# Patient Record
Sex: Female | Born: 1950 | Race: White | Hispanic: No | Smoking: Never smoker
Health system: Southern US, Community
[De-identification: ages and names within clinical notes are randomized; demographics above are authoritative.]

## PROBLEM LIST (undated history)

## (undated) DIAGNOSIS — I509 Heart failure, unspecified: Secondary | ICD-10-CM

## (undated) DIAGNOSIS — F329 Major depressive disorder, single episode, unspecified: Secondary | ICD-10-CM

## (undated) DIAGNOSIS — M1990 Unspecified osteoarthritis, unspecified site: Secondary | ICD-10-CM

## (undated) DIAGNOSIS — T7840XA Allergy, unspecified, initial encounter: Secondary | ICD-10-CM

## (undated) DIAGNOSIS — M51369 Other intervertebral disc degeneration, lumbar region without mention of lumbar back pain or lower extremity pain: Secondary | ICD-10-CM

## (undated) DIAGNOSIS — H269 Unspecified cataract: Secondary | ICD-10-CM

## (undated) DIAGNOSIS — M419 Scoliosis, unspecified: Secondary | ICD-10-CM

## (undated) DIAGNOSIS — M4800 Spinal stenosis, site unspecified: Secondary | ICD-10-CM

## (undated) DIAGNOSIS — F32A Depression, unspecified: Secondary | ICD-10-CM

## (undated) DIAGNOSIS — M5136 Other intervertebral disc degeneration, lumbar region: Secondary | ICD-10-CM

## (undated) DIAGNOSIS — J40 Bronchitis, not specified as acute or chronic: Secondary | ICD-10-CM

## (undated) HISTORY — PX: OTHER SURGICAL HISTORY: SHX169

## (undated) HISTORY — DX: Allergy, unspecified, initial encounter: T78.40XA

## (undated) HISTORY — DX: Other intervertebral disc degeneration, lumbar region without mention of lumbar back pain or lower extremity pain: M51.369

## (undated) HISTORY — DX: Heart failure, unspecified: I50.9

## (undated) HISTORY — DX: Unspecified cataract: H26.9

## (undated) HISTORY — DX: Other intervertebral disc degeneration, lumbar region: M51.36

## (undated) HISTORY — PX: LUMBAR SPINE SURGERY: SHX701

## (undated) HISTORY — PX: HEEL SPUR EXCISION: SHX1733

## (undated) HISTORY — PX: EYE SURGERY: SHX253

## (undated) HISTORY — PX: TONSILLECTOMY: SUR1361

---

## 2004-09-18 ENCOUNTER — Ambulatory Visit: Payer: Self-pay | Admitting: Internal Medicine

## 2005-12-31 ENCOUNTER — Ambulatory Visit: Payer: Self-pay | Admitting: Internal Medicine

## 2006-12-27 ENCOUNTER — Ambulatory Visit: Payer: Self-pay | Admitting: Nurse Practitioner

## 2006-12-27 IMAGING — US ULTRASOUND RIGHT BREAST
1 series · 7 of 7 positions shown · non-contrast
Comparison: none

REASON FOR EXAM: Painful and swollen red area at 3 o'clock in the right
breast, time for yearly exam
COMMENTS:

[Series 1: ultrasound right breast · 7 of 7 slices shown]
[im 1/7]
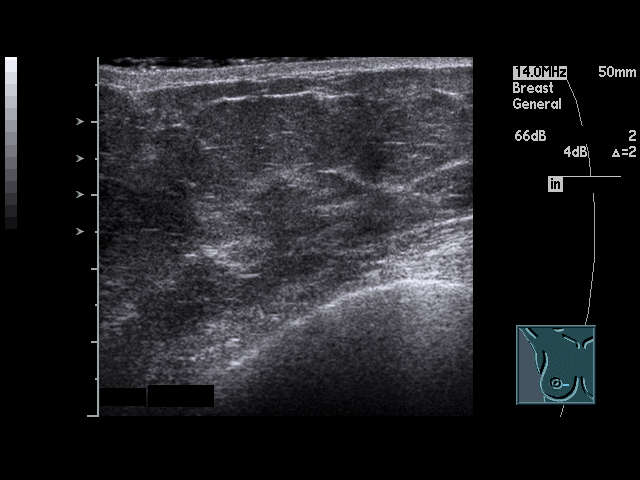
[im 2/7]
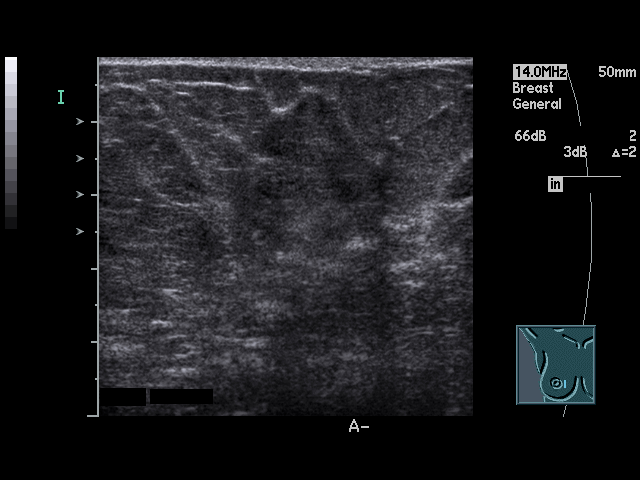
[im 3/7]
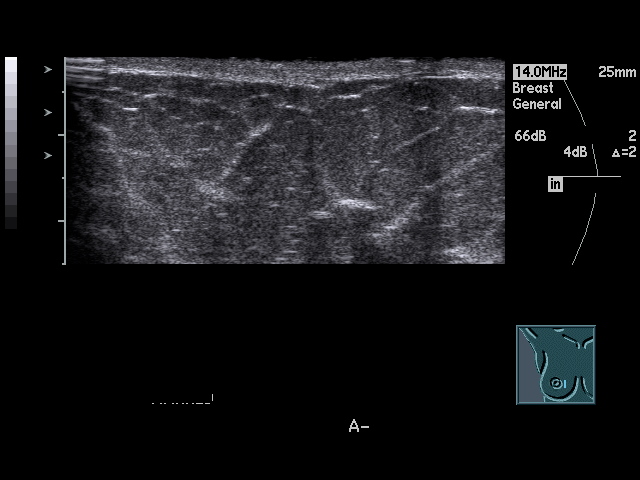
[im 4/7]
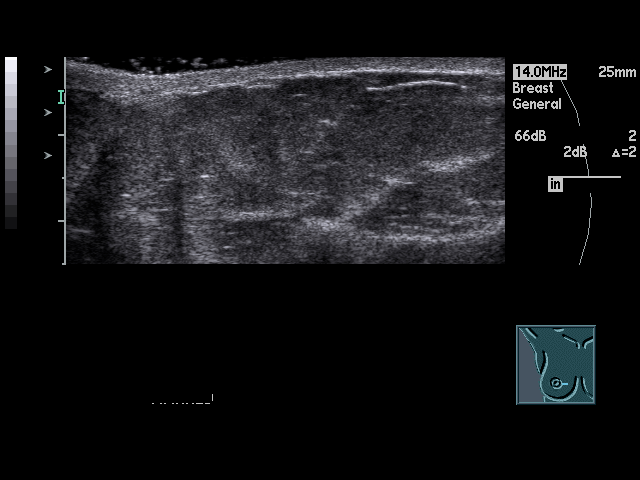
[im 5/7]
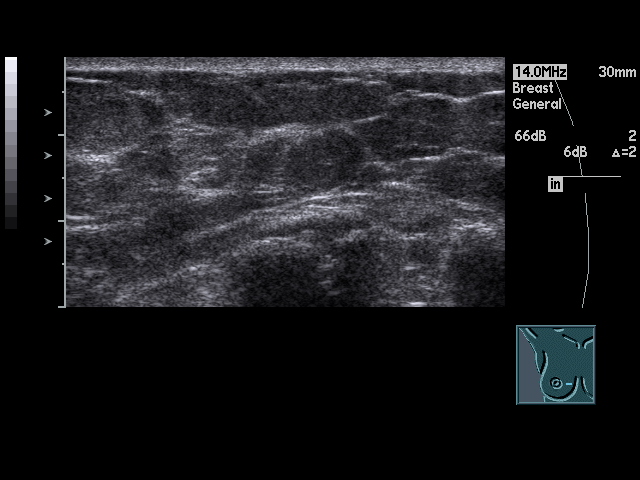
[im 6/7]
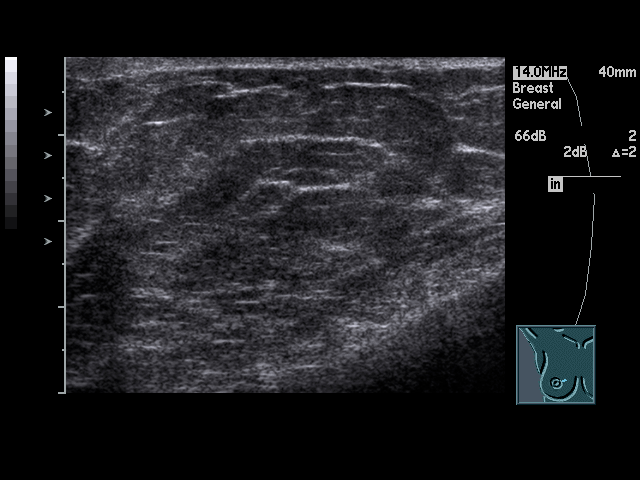
[im 7/7]
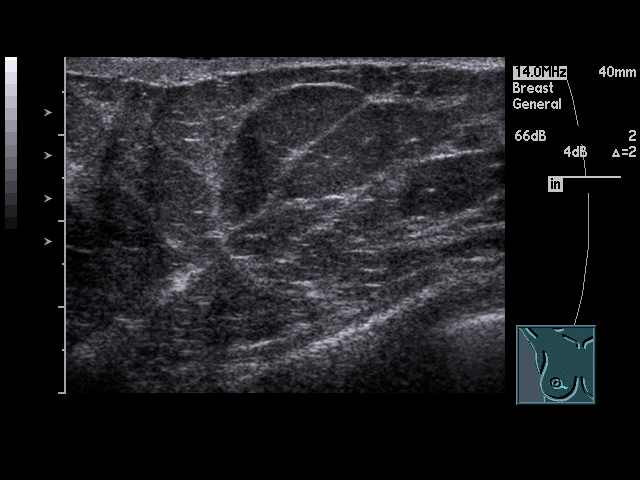

[7 of 7 positions shown; findings below may reference images not displayed]

PROCEDURE:     US  - US BREAST RIGHT  - December 27, 2006 [DATE]

RESULT:     Targeted sonographic evaluation of the 3 o'clock region of the
RIGHT breast is performed. The patient reports the area is less worrisome
than previously and appears to be resolving. Ultrasound demonstrates no
focal sonographic abnormality.
IMPRESSION: BI-RADS: Category 2 - Benign Findings. The lack of
significant sonographic or mammographic finding should not prohibit further
investigation of a clinically worrisome area.

## 2008-01-30 ENCOUNTER — Ambulatory Visit: Payer: Self-pay | Admitting: Internal Medicine

## 2008-12-17 ENCOUNTER — Ambulatory Visit: Payer: Self-pay | Admitting: Gastroenterology

## 2009-10-08 ENCOUNTER — Ambulatory Visit: Payer: Self-pay | Admitting: Internal Medicine

## 2011-07-23 ENCOUNTER — Ambulatory Visit: Payer: Self-pay | Admitting: Internal Medicine

## 2011-07-23 IMAGING — MG MM DIGITAL SCREENING BILAT W/ CAD
1 series · 4 of 4 positions shown · non-contrast
Comparison: none

REASON FOR EXAM: SCR MAMMO NO ORDER
COMMENTS:

PROCEDURE:     MMM - MMM DGT SCR NO ORDER W/CAD  - July 23, 2011  [DATE]
RESULT:      No dominant masses or pathologic clustered calcifications are
demonstrated. CAD evaluation is nonfocal.

[Series 3977: R CC · right · 4 of 4 slices shown]
[im 1/4]
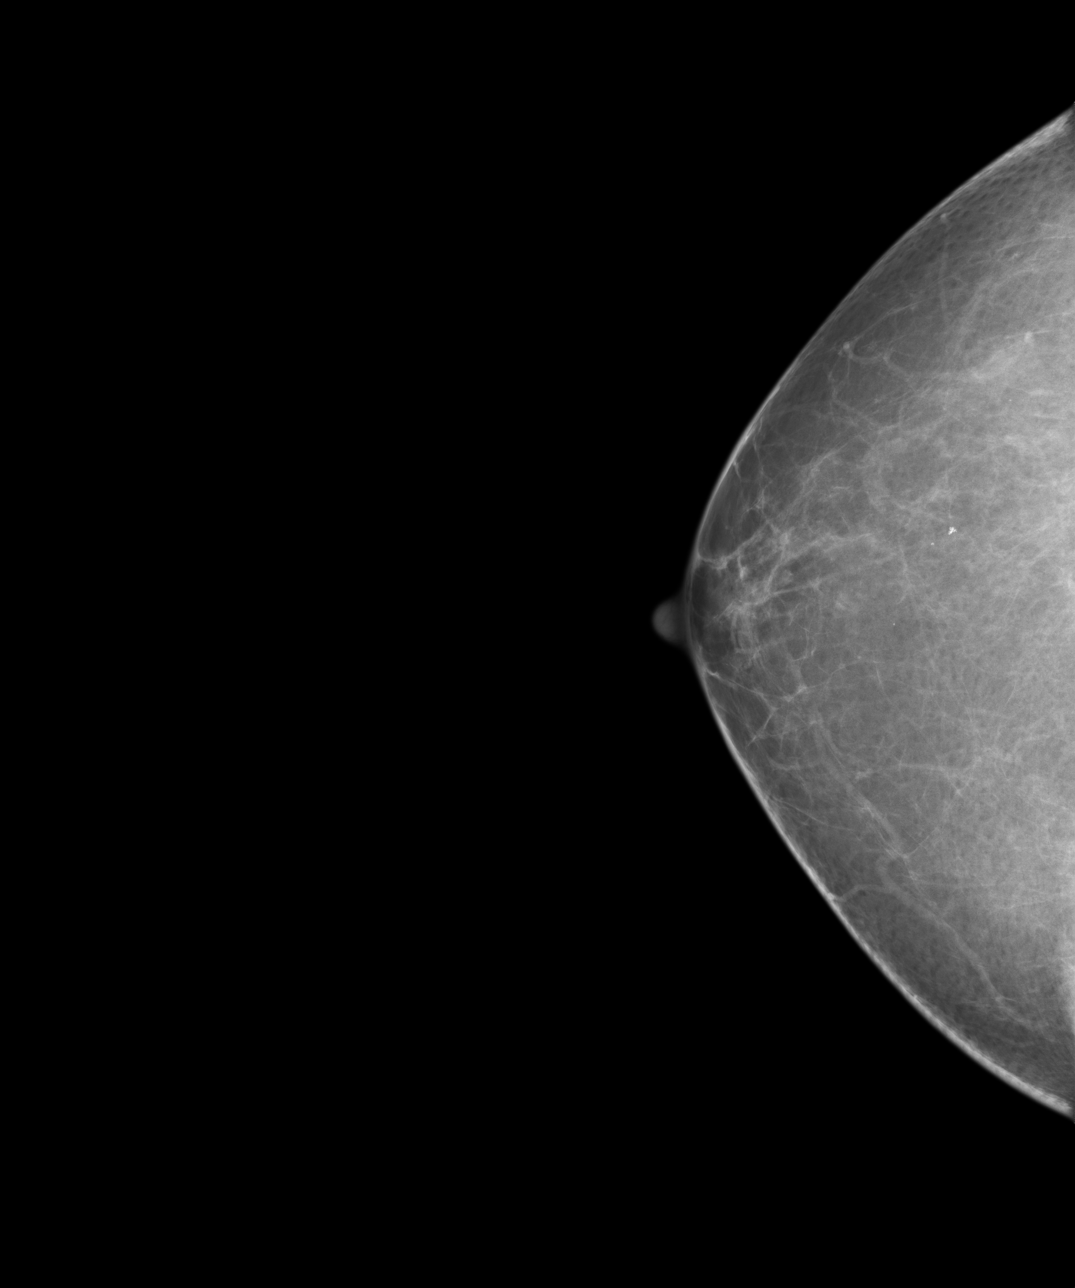
[im 2/4]
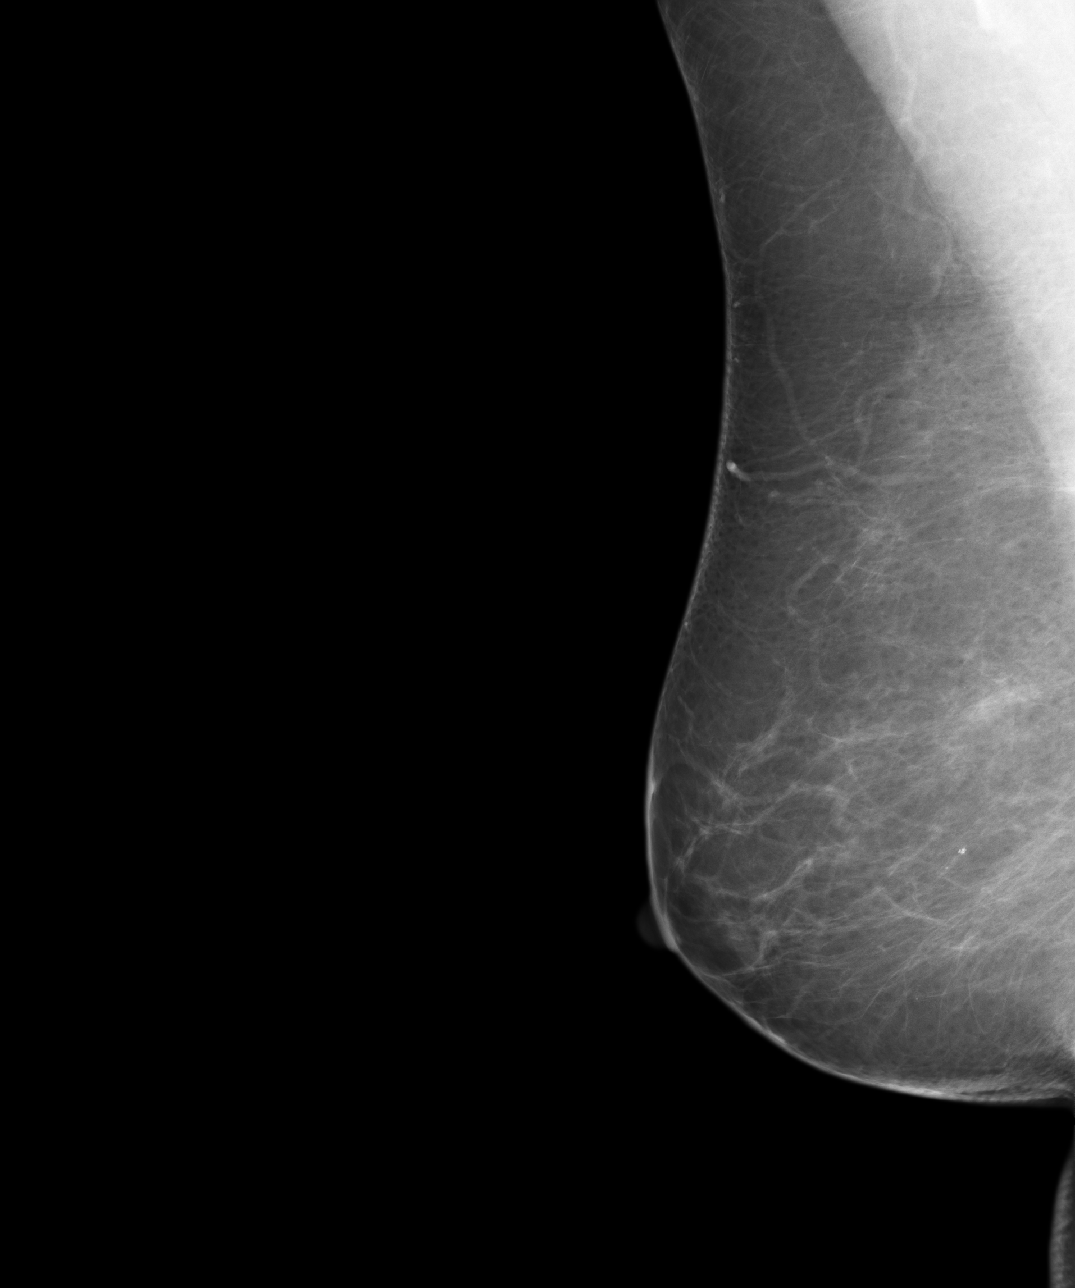
[im 3/4]
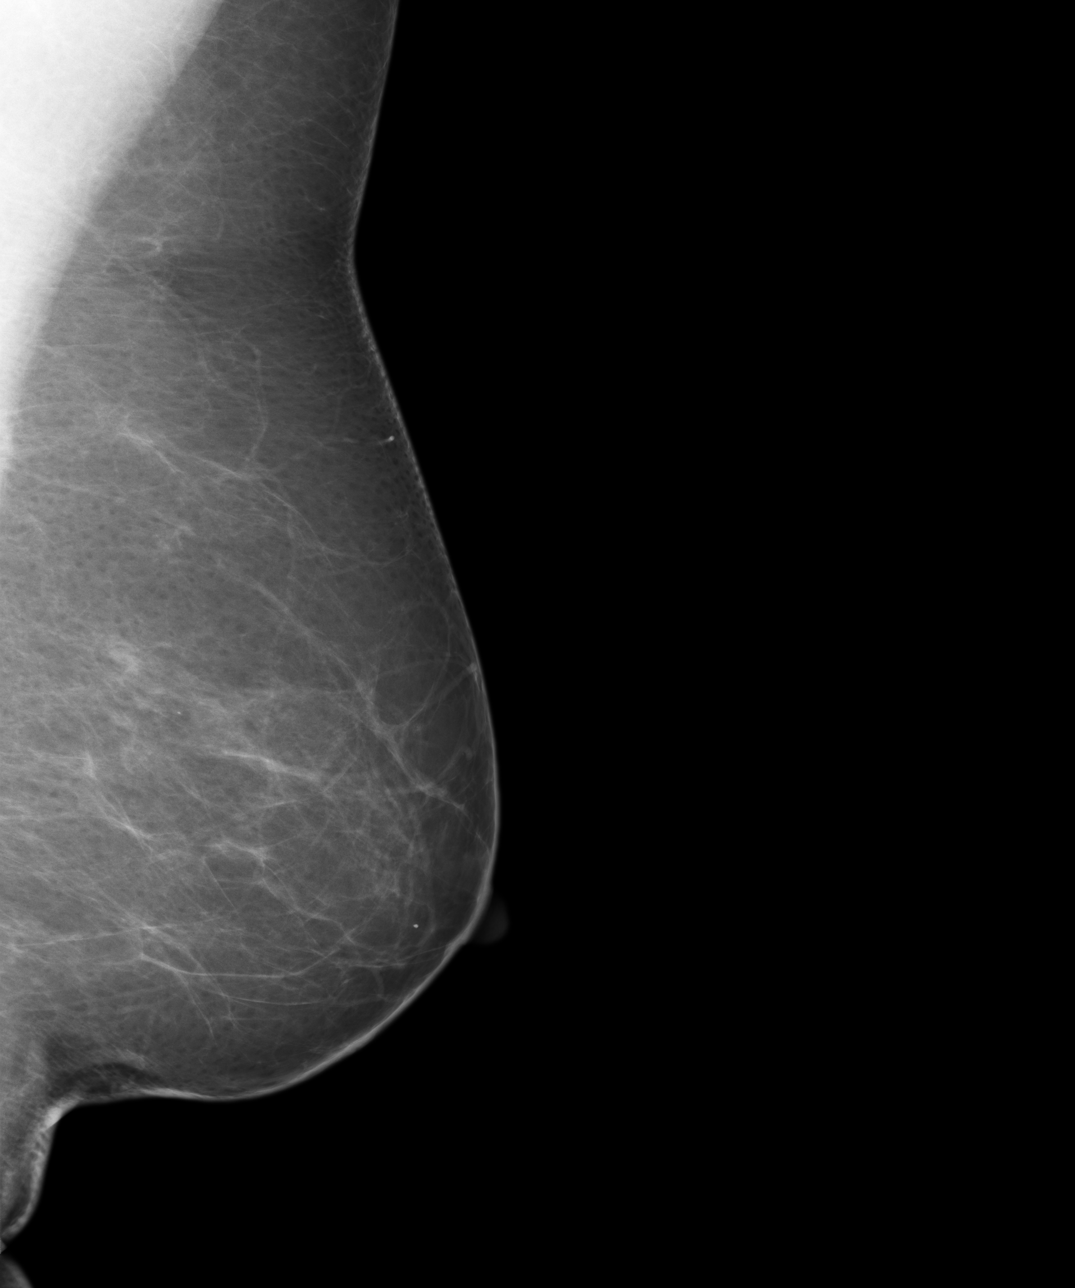
[im 4/4]
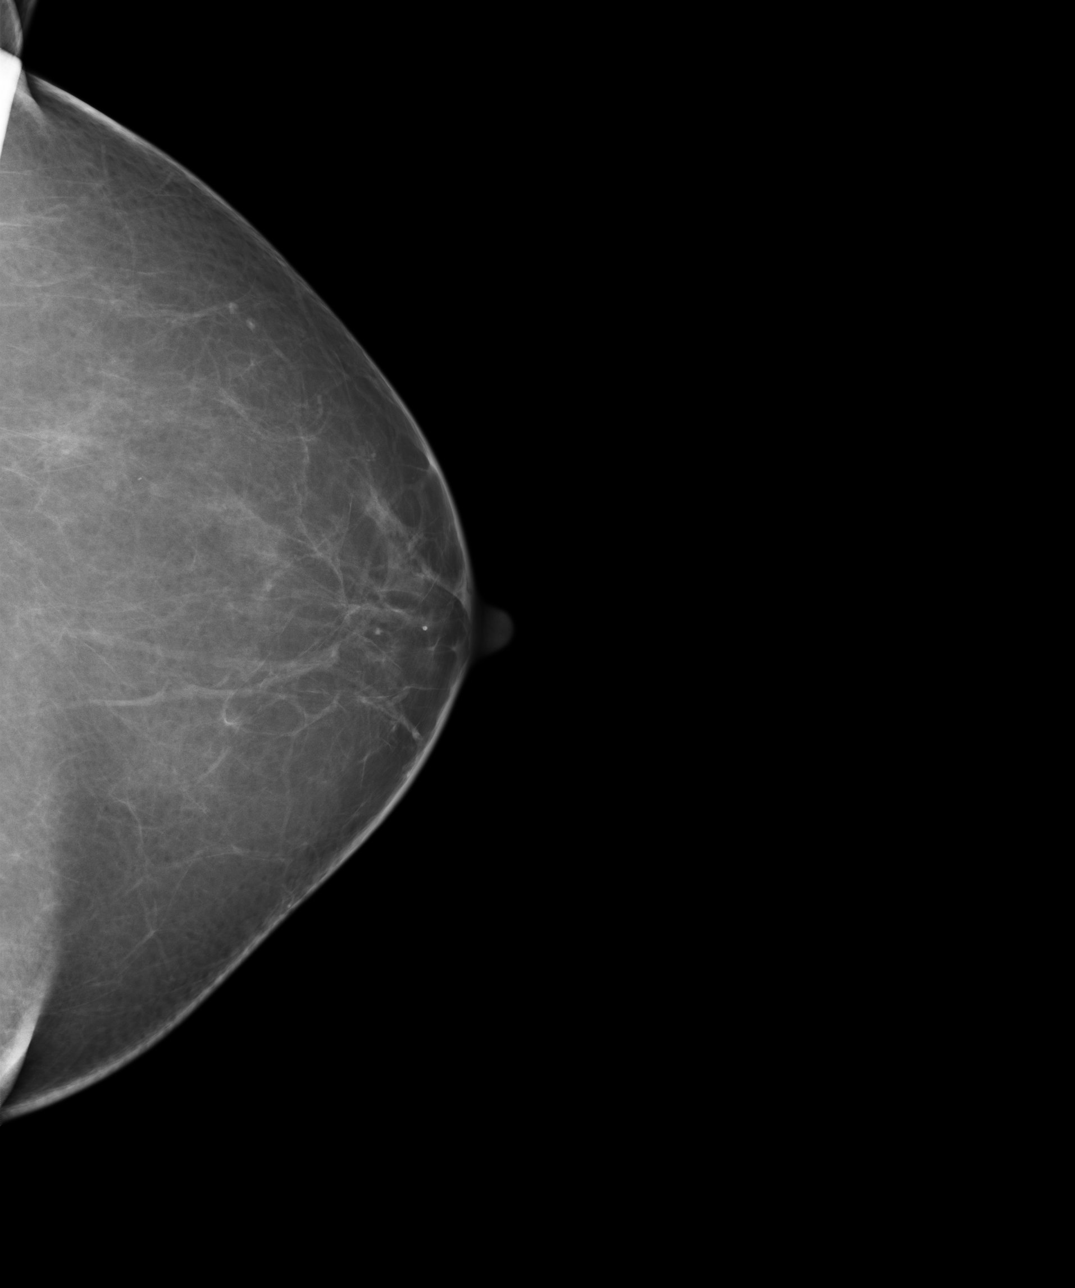

[4 of 4 positions shown; findings below may reference images not displayed]

IMPRESSION: 1.     Benign exam.
2.     Routine yearly follow-up mammogram suggested.
3.     BI-RADS 2:  Benign Findings.

A NEGATIVE MAMMOGRAM REPORT DOES NOT PRECLUDE BIOPSY OR OTHER EVALUATION OF
A CLINICALLY PALPABLE OR OTHERWISE SUSPICOUS MASS OR LESION.  BREAST CANCER
MAY NOT BE DETECTED BY MAMMOGRAPHY IN UP TO 10% OF CASES.

## 2011-10-08 ENCOUNTER — Ambulatory Visit: Payer: Self-pay | Admitting: Internal Medicine

## 2011-10-08 IMAGING — US US ABDOMEN LIMITED SLG ORGAN/ASCITES
1 series · 17 of 25 positions shown · non-contrast
Comparison: none

REASON FOR EXAM: RUQ pain eval for  gallstones
COMMENTS:

[Series 1: us abdomen limited slg organ/ascites · 17 of 37 slices shown]
[im 1/37]
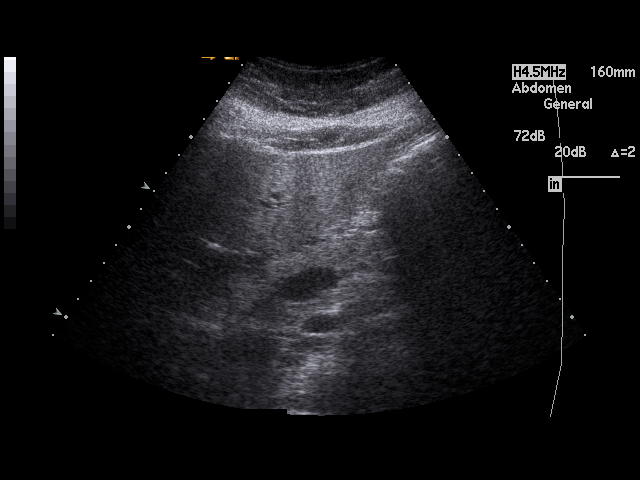
[im 4/37]
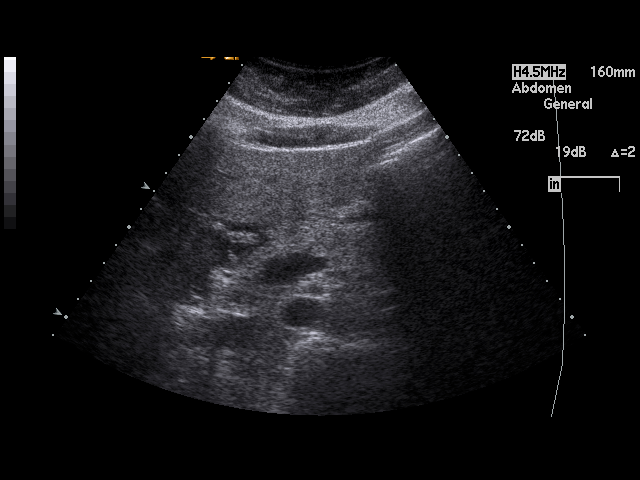
[im 5/37]
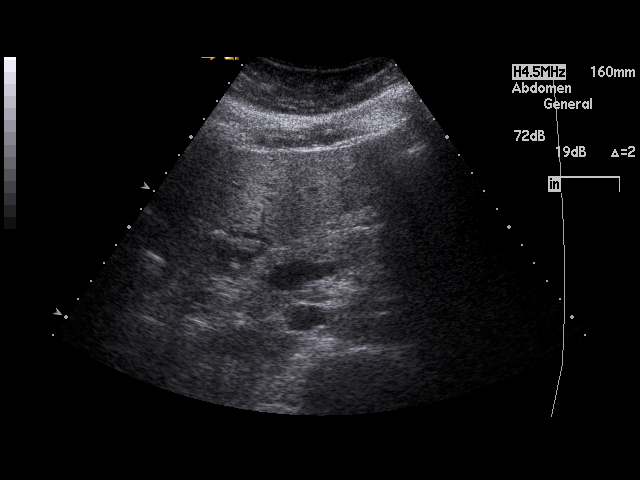
[im 8/37]
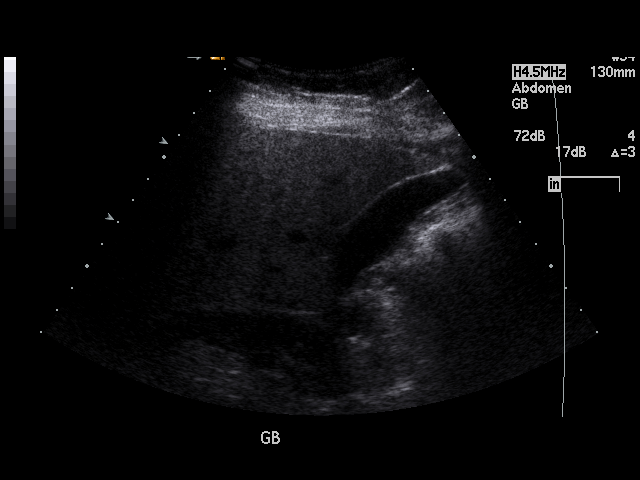
[im 10/37]
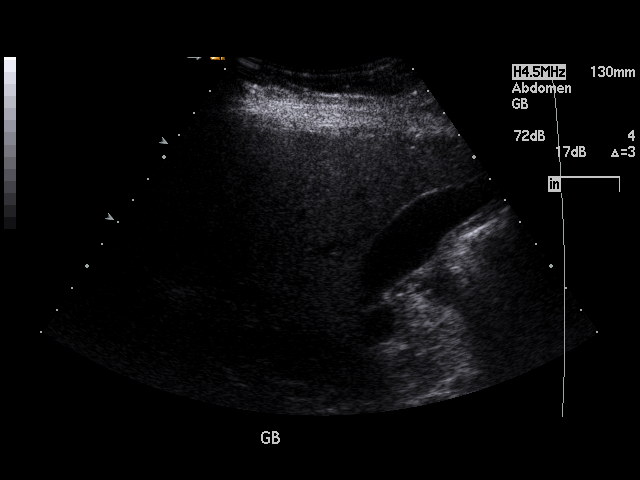
[im 13/37]
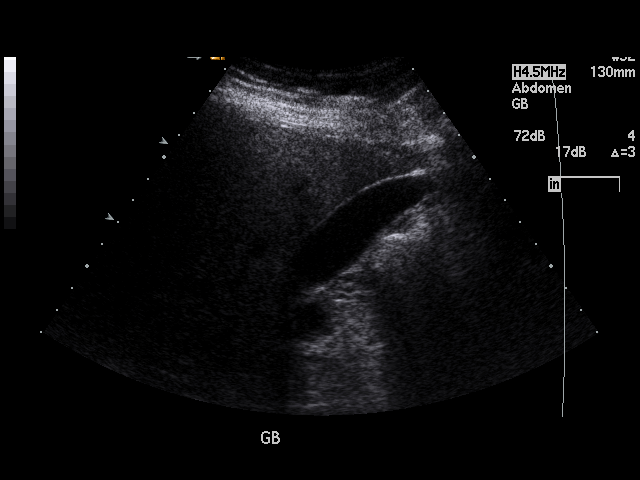
[im 14/37]
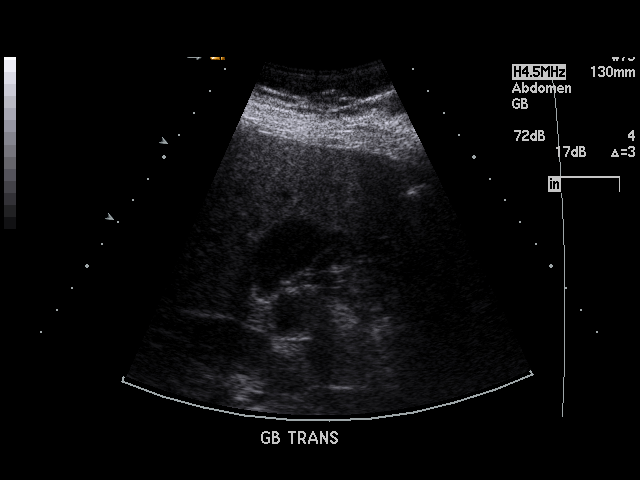
[im 17/37]
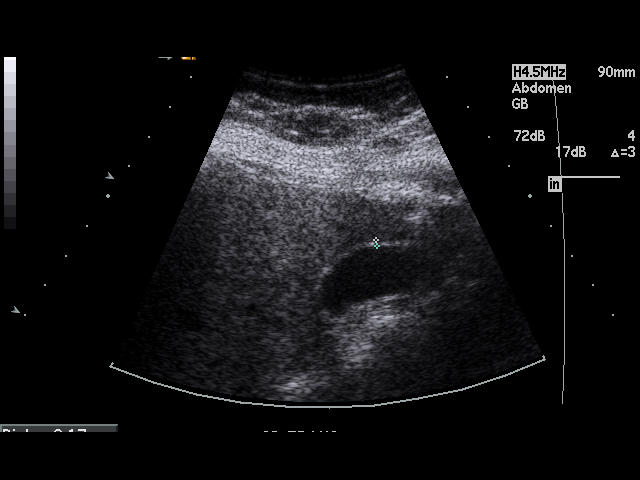
[im 19/37]
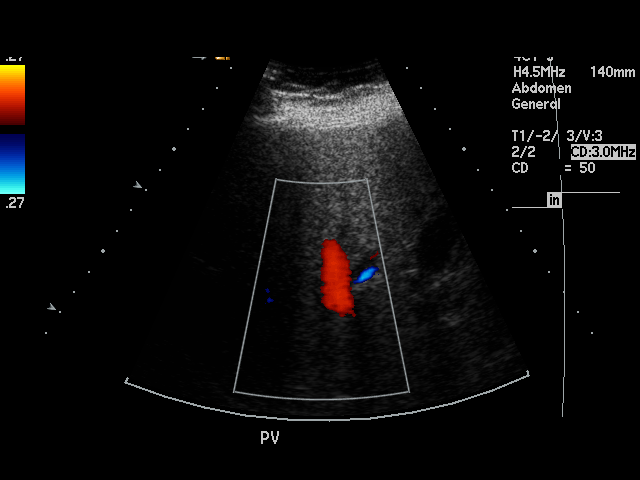
[im 20/37]
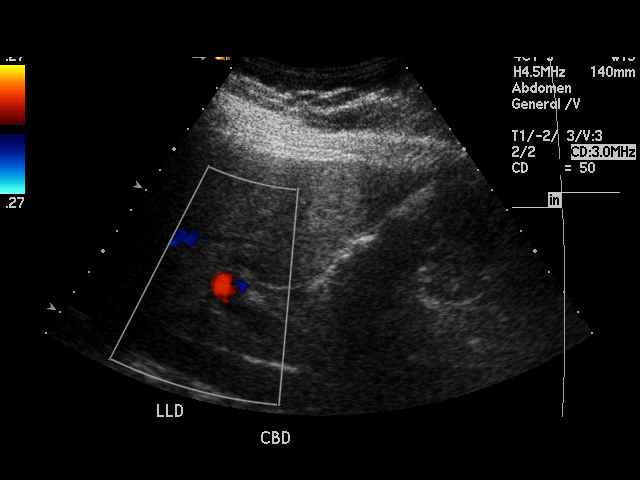
[im 23/37]
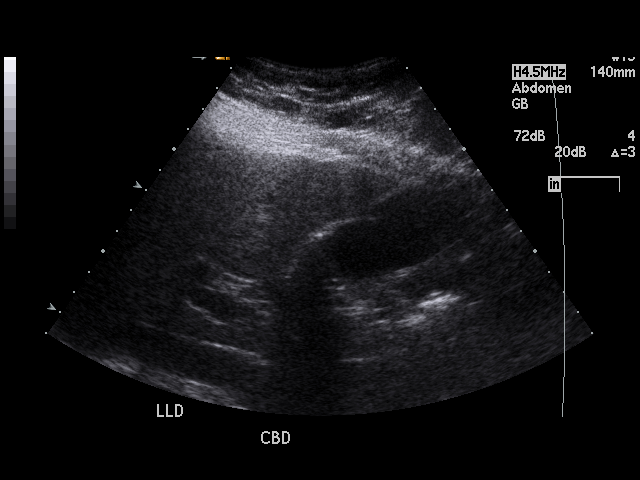
[im 25/37]
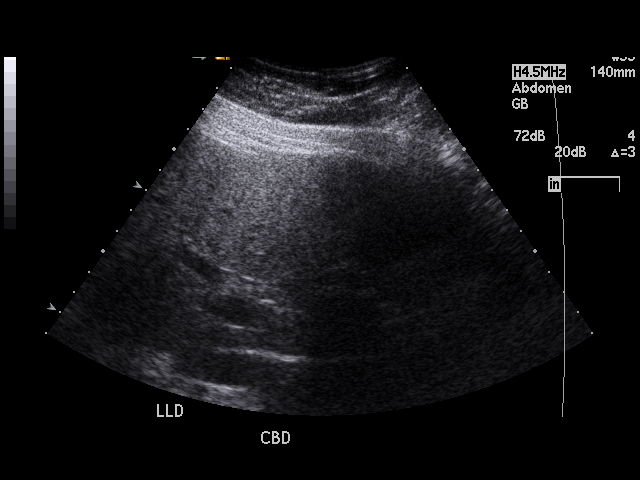
[im 28/37]
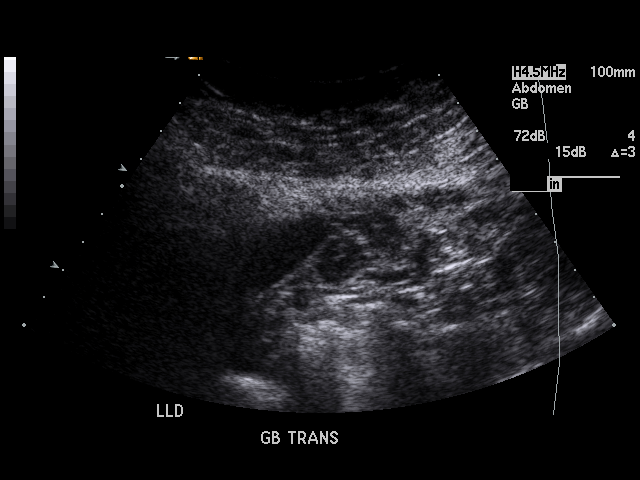
[im 29/37]
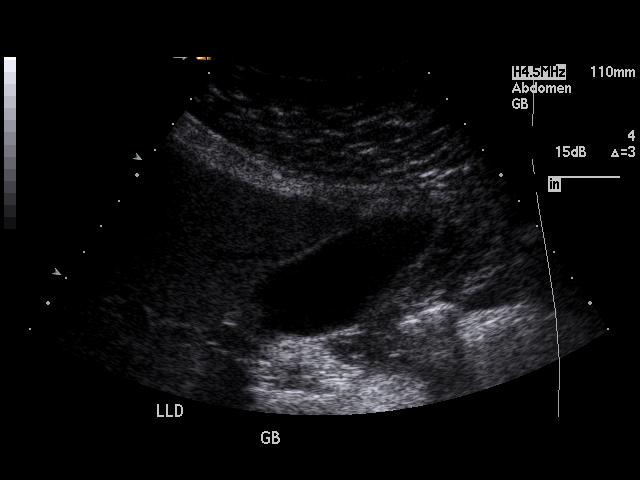
[im 32/37]
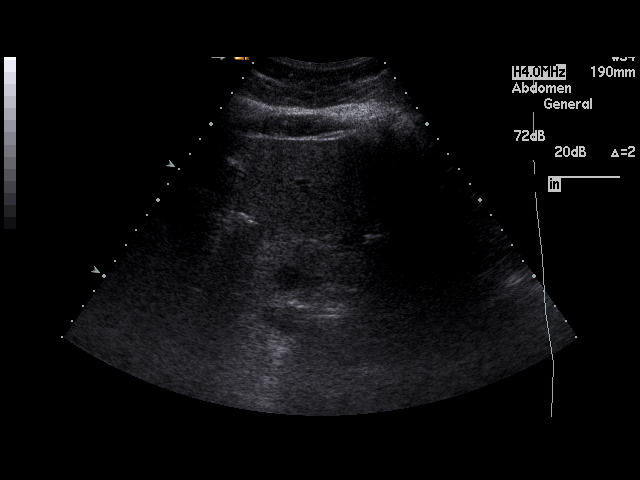
[im 34/37]
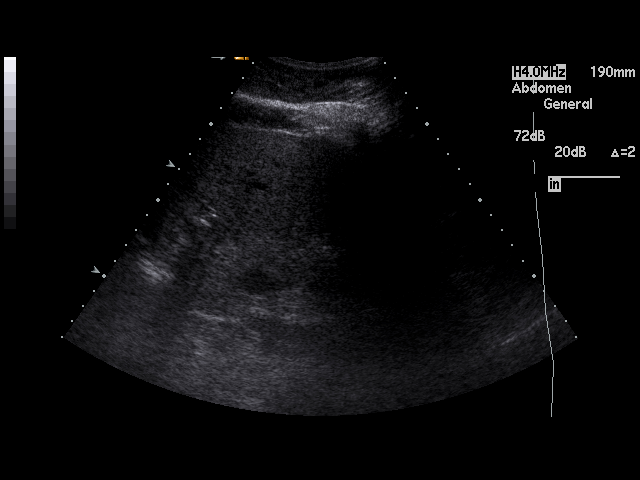
[im 37/37]
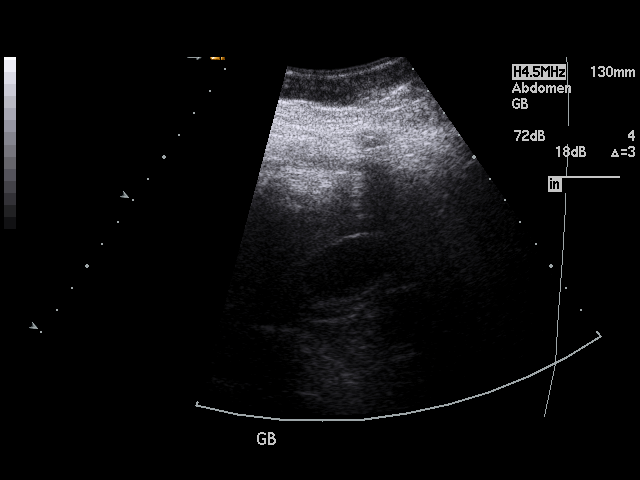

[17 of 25 positions shown; findings below may reference images not displayed]

PROCEDURE:     JHINSON - JHINSON ABDOMEN LTD 1 ORGAN OR QUAD  - October 08, 2011  [DATE]

RESULT:     Limited abdominal ultrasound examination was performed for
evaluation of the gallbladder and biliary tract. No gallstones are seen.
There is no thickening of the gallbladder wall. The common bile duct
measures 2.5 mm in diameter which is within normal limits. The pancreas is
normal in appearance. No dilated intrahepatic ducts are seen. The liver
appears mildly hyperechogenic suspicious for fatty infiltration.
IMPRESSION: 1. No gallstones or other acute change is identified.
2. Possible fatty infiltration of the liver.

## 2011-10-08 IMAGING — NM NUCLEAR MEDICINE HEPATOHBILIARY INCLUDE GB
3 series · 21 of 21 positions shown · non-contrast
Comparison: none

REASON FOR EXAM: RUQ pain eval for  gallstones
COMMENTS:

[Series 1000: gallbladder dynamic (results) · 4.80mm/px · 6 of 60 frames shown]
[frame 6/60]
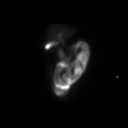
[frame 16/60]
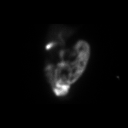
[frame 26/60]
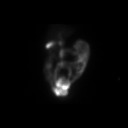
[frame 36/60]
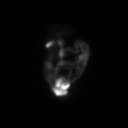
[frame 46/60]
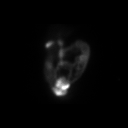
[frame 56/60]
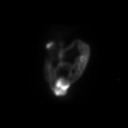

[Series 1000: gallbladder dynamic · 4.80mm/px · 6 of 60 frames shown]
[frame 6/60]
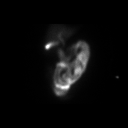
[frame 16/60]
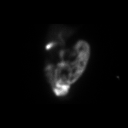
[frame 26/60]
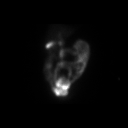
[frame 36/60]
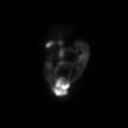
[frame 46/60]
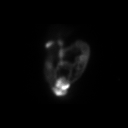
[frame 56/60]
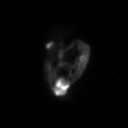

[Series 1000: gallbladder statics · 4.80mm/px · 9 of 9 slices shown]
[im 1/9]
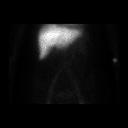
[im 2/9]
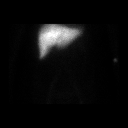
[im 3/9]
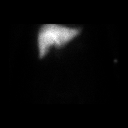
[im 4/9]
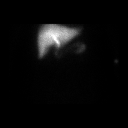
[im 5/9]
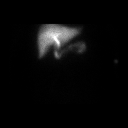
[im 6/9]
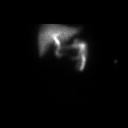
[im 7/9]
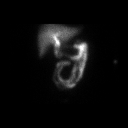
[im 8/9]
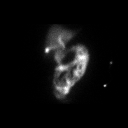
[im 9/9]
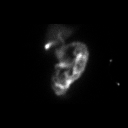

[21 of 21 positions shown; findings below may reference images not displayed]

PROCEDURE:     KNM - KNM HEPATO W/GB EJECT FRACTION  - October 08, 2011 [DATE]

RESULT:     Following intravenous administration of 8.28 mCi technetium 99m
Choletec, there is noted prompt visualization of tracer activity in the
liver at 3 minutes. At 40 minutes tracer activity is visualized in the
gallbladder, common duct and proximal small bowel.

The gallbladder ejection fraction measures 39%, which is within the normal
range. The lower limit for normal at this institution is 35%.
IMPRESSION: 1. Normal hepatobiliary scan.
2. The gallbladder ejection fraction measures 39%.

## 2014-01-08 DIAGNOSIS — E559 Vitamin D deficiency, unspecified: Secondary | ICD-10-CM | POA: Insufficient documentation

## 2014-01-08 DIAGNOSIS — E782 Mixed hyperlipidemia: Secondary | ICD-10-CM | POA: Insufficient documentation

## 2014-01-16 ENCOUNTER — Ambulatory Visit: Payer: Self-pay | Admitting: Internal Medicine

## 2014-01-16 IMAGING — MG MM DIGITAL SCREENING BILAT W/ CAD
1 series · 4 of 4 positions shown · non-contrast
Comparison: Previous exam(s).

CLINICAL DATA: Screening.

EXAM:
DIGITAL SCREENING BILATERAL MAMMOGRAM WITH CAD

[R CC · right · 4 of 4 slices shown]
[im 1/4]
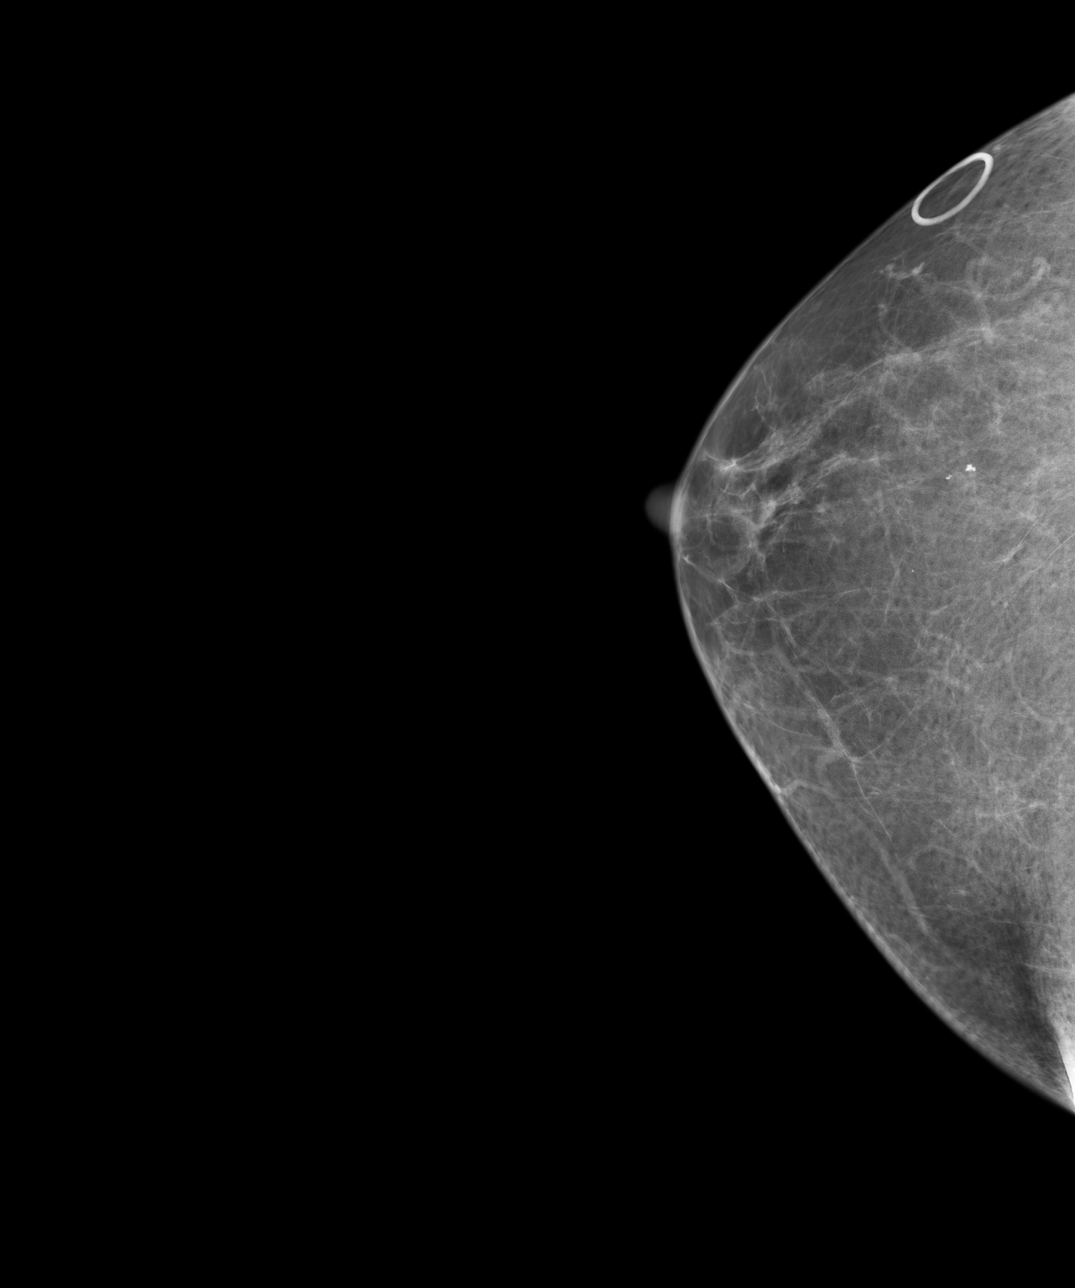
[im 2/4]
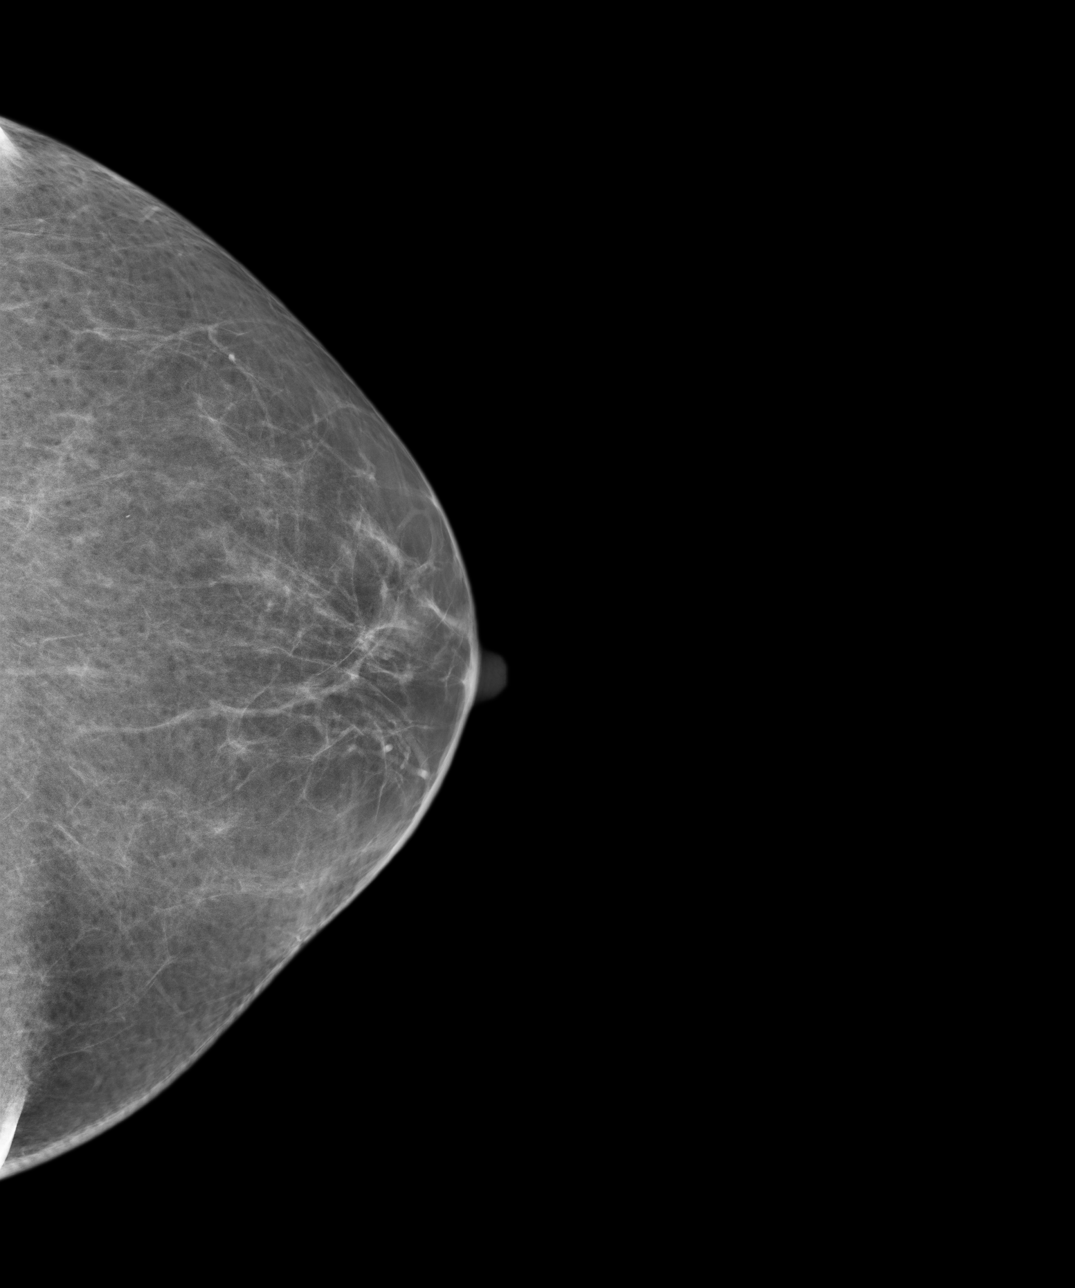
[im 3/4]
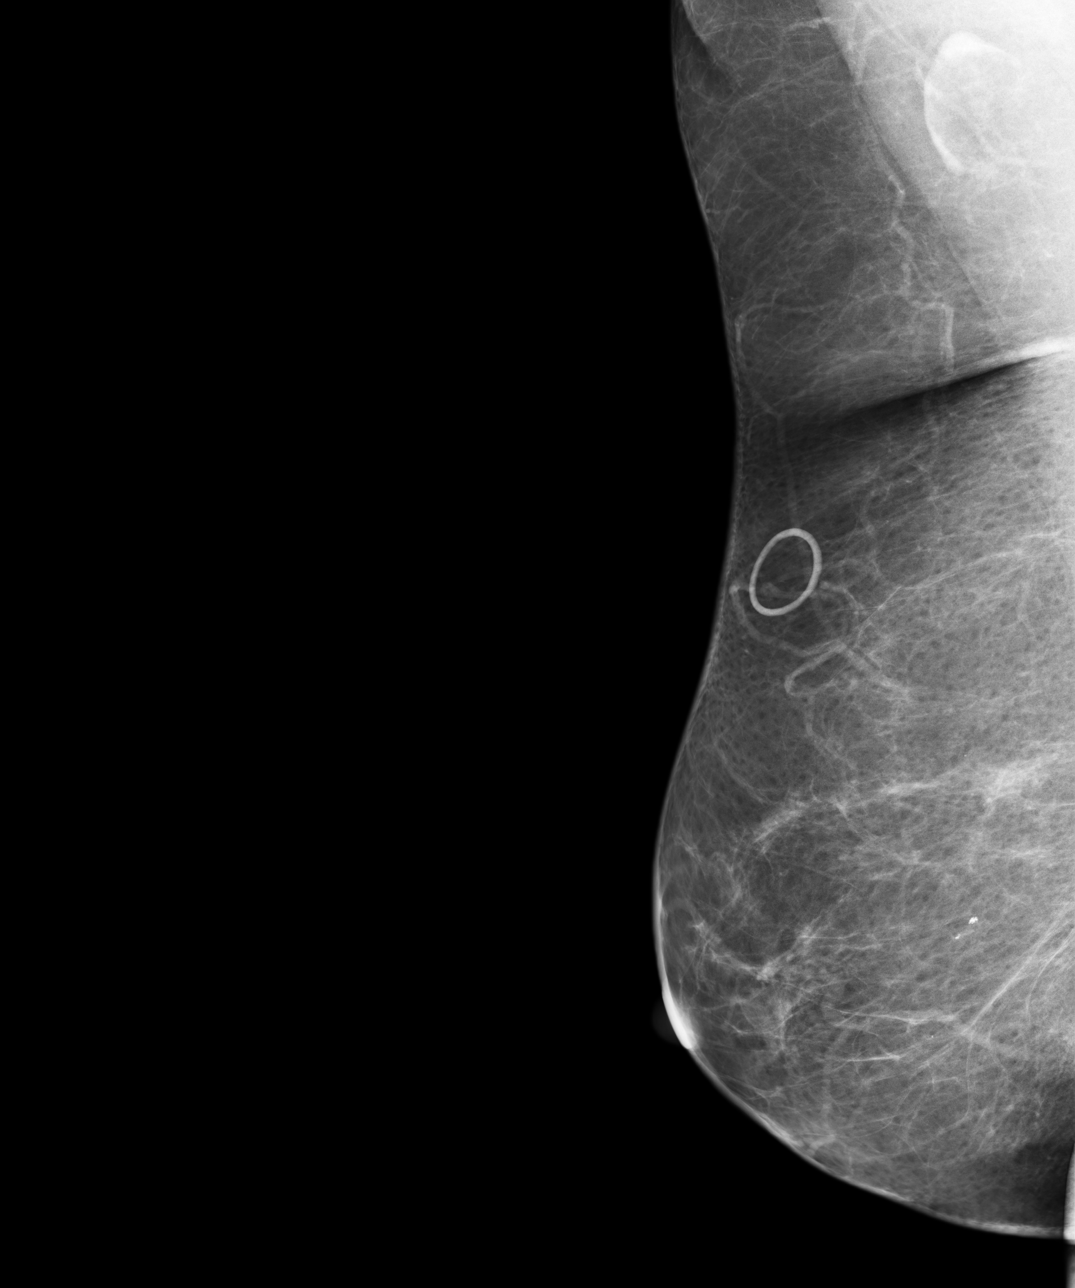
[im 4/4]
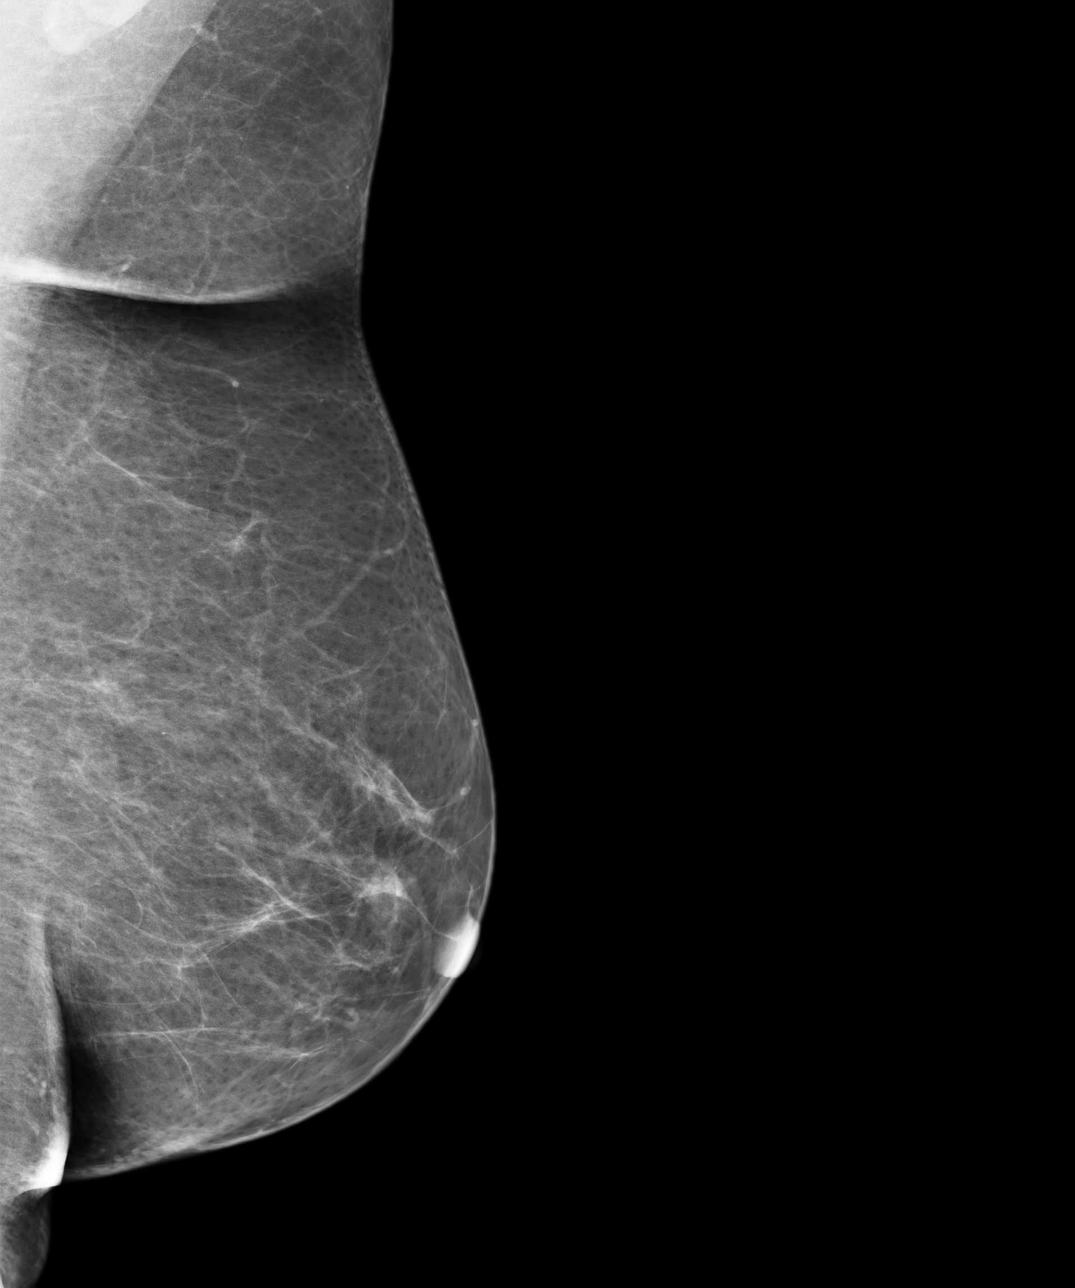

[4 of 4 positions shown; findings below may reference images not displayed]

ACR Breast Density Category b: There are scattered areas of
fibroglandular density.
FINDINGS: There are no findings suspicious for malignancy. Images were
processed with CAD.
IMPRESSION: No mammographic evidence of malignancy. A result letter of this
screening mammogram will be mailed directly to the patient.

RECOMMENDATION:
Screening mammogram in one year. (Code:AS-G-LCT)

BI-RADS CATEGORY  1: Negative.

## 2015-01-09 ENCOUNTER — Other Ambulatory Visit: Payer: Self-pay | Admitting: Internal Medicine

## 2015-01-09 DIAGNOSIS — Z1231 Encounter for screening mammogram for malignant neoplasm of breast: Secondary | ICD-10-CM

## 2015-01-09 DIAGNOSIS — M419 Scoliosis, unspecified: Secondary | ICD-10-CM | POA: Insufficient documentation

## 2015-01-22 ENCOUNTER — Ambulatory Visit: Payer: Self-pay

## 2015-01-25 ENCOUNTER — Ambulatory Visit
Admission: RE | Admit: 2015-01-25 | Discharge: 2015-01-25 | Disposition: A | Discharge status: Home / Self Care | Payer: BLUE CROSS/BLUE SHIELD | Source: Ambulatory Visit | Attending: Internal Medicine | Admitting: Internal Medicine

## 2015-01-25 DIAGNOSIS — Z1231 Encounter for screening mammogram for malignant neoplasm of breast: Secondary | ICD-10-CM | POA: Diagnosis present

## 2015-01-25 IMAGING — MG MM DIGITAL SCREENING BILAT W/ CAD
4 series · 4 of 4 positions shown · non-contrast
Comparison: Previous exam(s).

CLINICAL DATA: Screening.

EXAM:
DIGITAL SCREENING BILATERAL MAMMOGRAM WITH CAD

[L MLO]
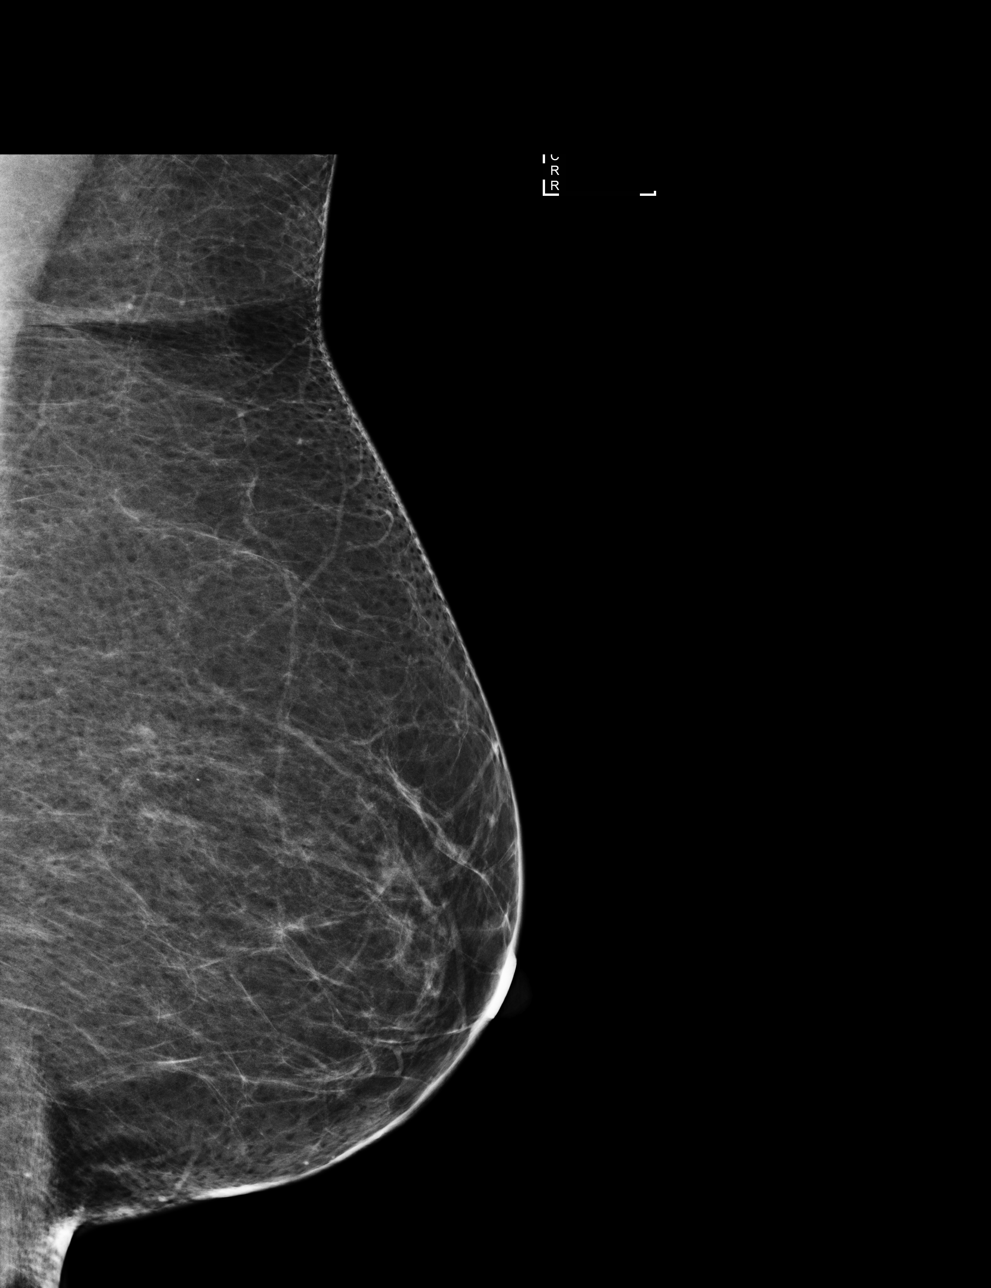

[R MLO]
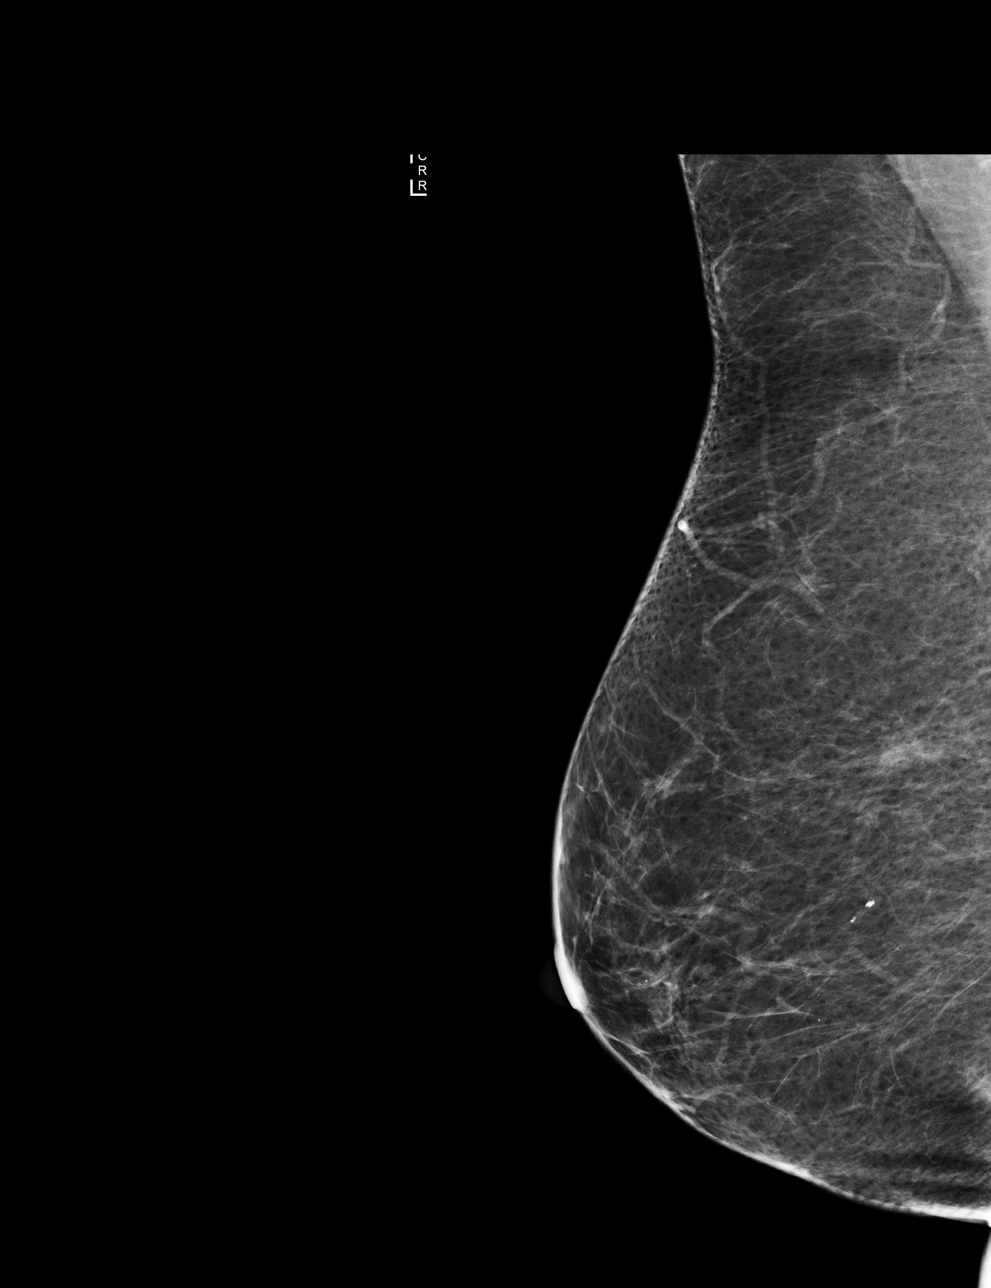

[R CC]
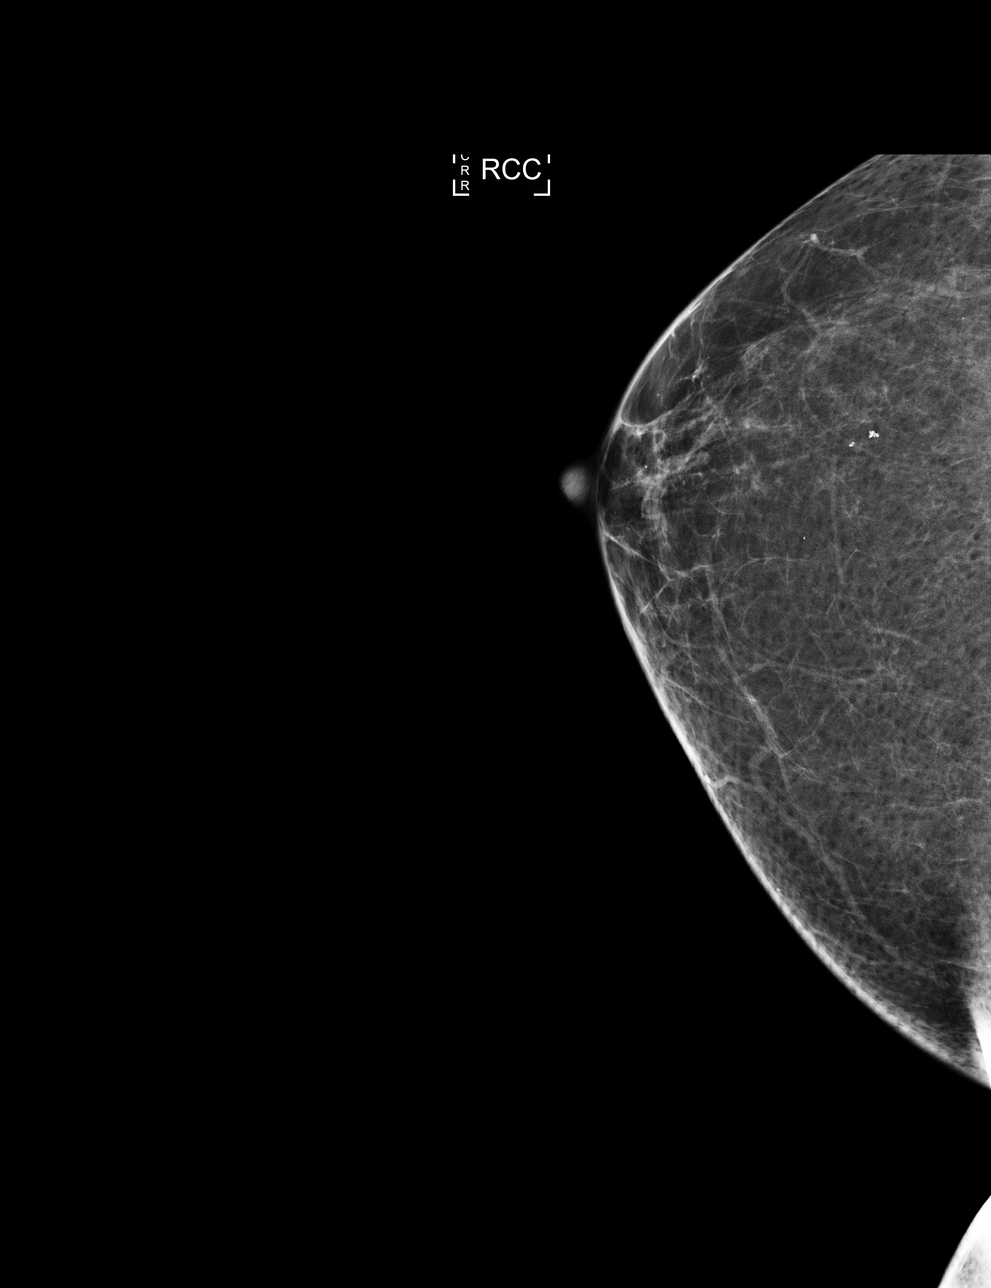

[L CC]
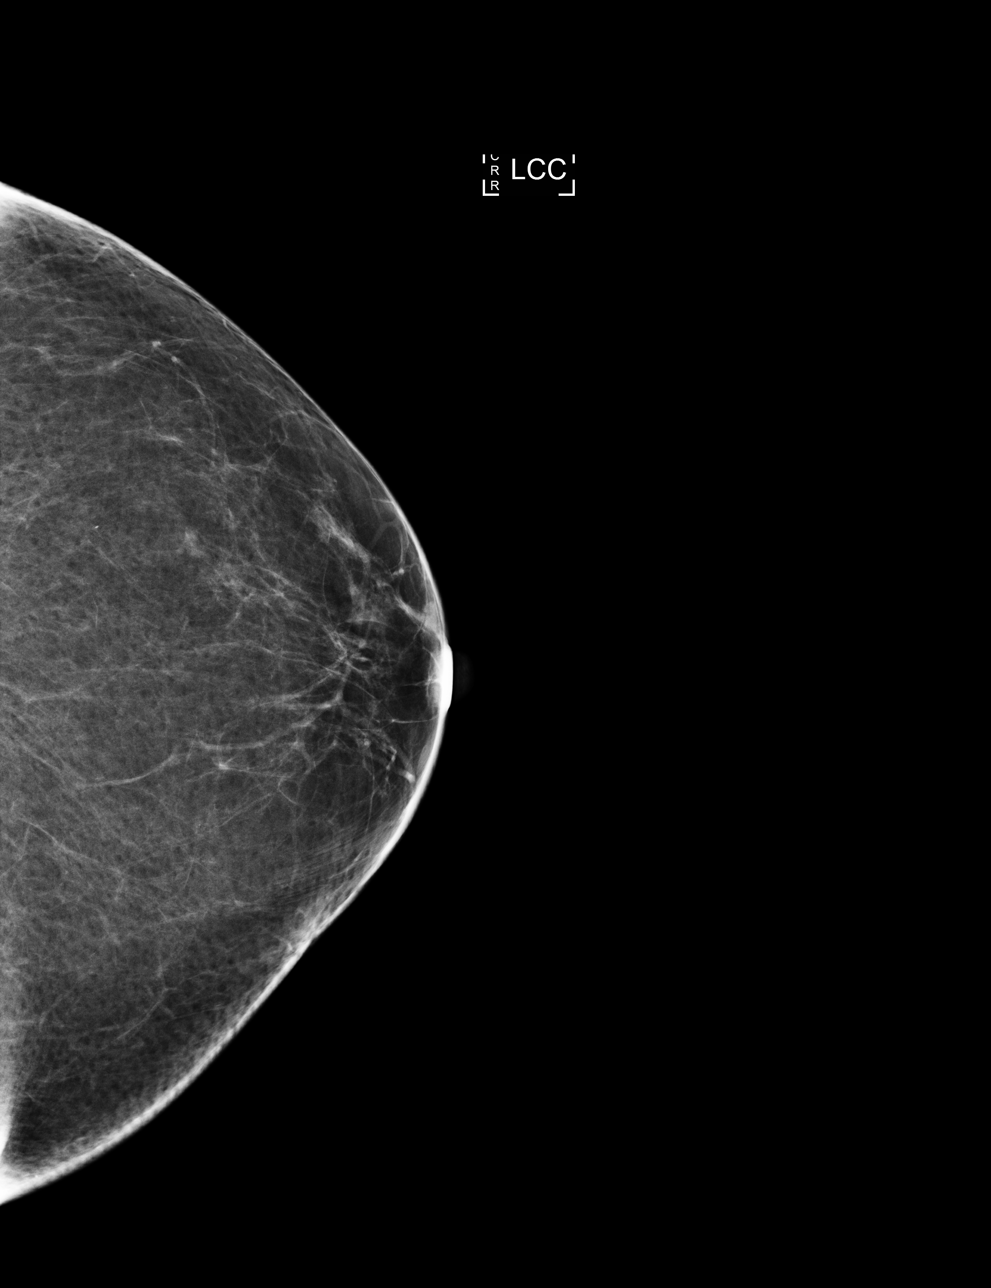

[4 of 4 positions shown; findings below may reference images not displayed]

ACR Breast Density Category b: There are scattered areas of
fibroglandular density.
FINDINGS: There are no findings suspicious for malignancy. Images were
processed with CAD.
IMPRESSION: No mammographic evidence of malignancy. A result letter of this
screening mammogram will be mailed directly to the patient.

RECOMMENDATION:
Screening mammogram in one year. (Code:AS-G-LCT)

BI-RADS CATEGORY  1: Negative.

## 2016-01-15 ENCOUNTER — Other Ambulatory Visit: Payer: Self-pay | Admitting: Orthopedic Surgery

## 2016-01-15 DIAGNOSIS — M5136 Other intervertebral disc degeneration, lumbar region: Secondary | ICD-10-CM

## 2016-01-15 DIAGNOSIS — M48061 Spinal stenosis, lumbar region without neurogenic claudication: Secondary | ICD-10-CM

## 2016-01-15 DIAGNOSIS — M51369 Other intervertebral disc degeneration, lumbar region without mention of lumbar back pain or lower extremity pain: Secondary | ICD-10-CM

## 2016-02-11 ENCOUNTER — Ambulatory Visit
Admission: RE | Admit: 2016-02-11 | Discharge: 2016-02-11 | Disposition: A | Discharge status: Home / Self Care | Payer: BLUE CROSS/BLUE SHIELD | Source: Ambulatory Visit | Attending: Orthopedic Surgery | Admitting: Orthopedic Surgery

## 2016-02-11 DIAGNOSIS — M48061 Spinal stenosis, lumbar region without neurogenic claudication: Secondary | ICD-10-CM

## 2016-02-11 DIAGNOSIS — M5136 Other intervertebral disc degeneration, lumbar region: Secondary | ICD-10-CM

## 2016-02-11 DIAGNOSIS — M419 Scoliosis, unspecified: Secondary | ICD-10-CM | POA: Insufficient documentation

## 2016-02-11 DIAGNOSIS — M4806 Spinal stenosis, lumbar region: Secondary | ICD-10-CM | POA: Diagnosis present

## 2016-02-11 IMAGING — MR MR LUMBAR SPINE W/O CM
4 of 5 series · 23 of 48 positions shown · non-contrast
Comparison: 03/14/2010 CT scan

CLINICAL DATA: Low back pain. Pain in both legs. Scoliosis and
arthritis.

EXAM:
MRI LUMBAR SPINE WITHOUT CONTRAST
TECHNIQUE: Multiplanar, multisequence MR imaging of the lumbar spine was
performed. No intravenous contrast was administered.

[Series 2: T2 · sagittal · 4.0mm · 0.81mm/px · 6 of 17 slices shown (1 of 2)]
[im 1/17]
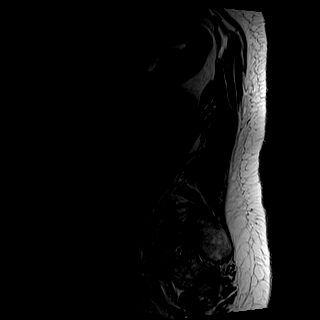
[im 4/17]
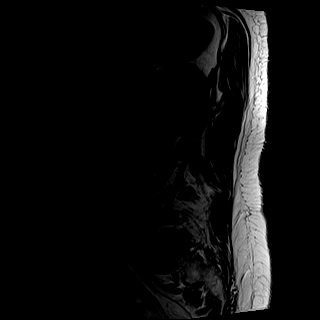
[im 7/17]
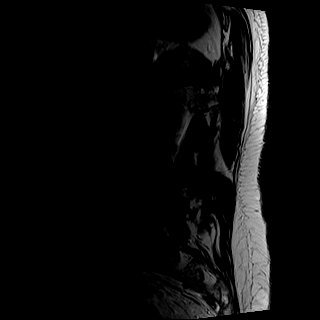
[im 10/17]
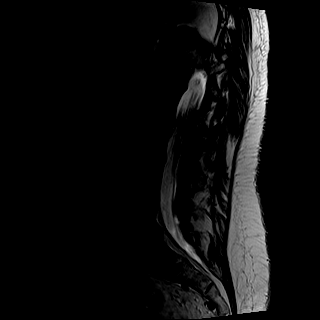
[im 13/17]
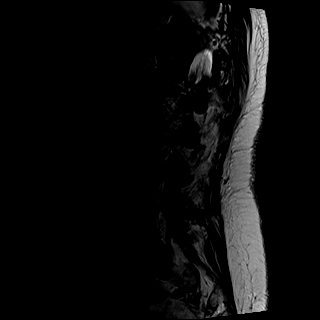
[im 17/17]
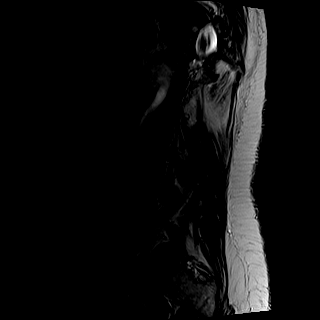

[Series 3: T1 · sagittal · 4.0mm · 0.41mm/px · 6 of 17 slices shown (1 of 2)]
[im 1/17]
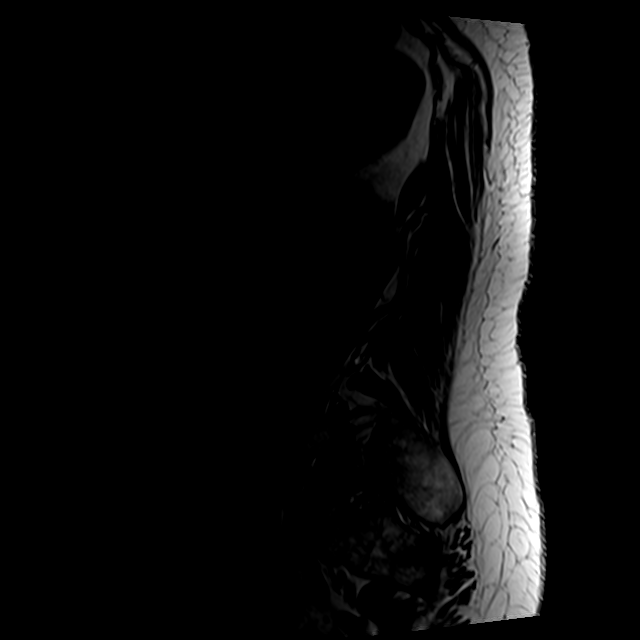
[im 3/17]
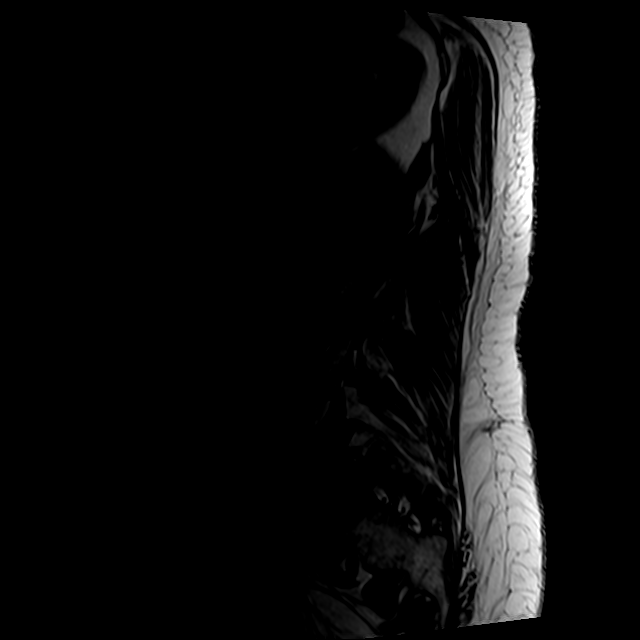
[im 6/17]
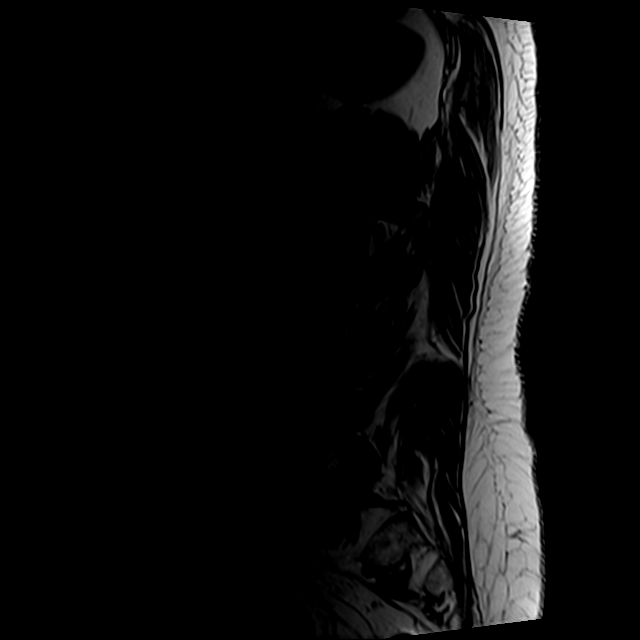
[im 9/17]
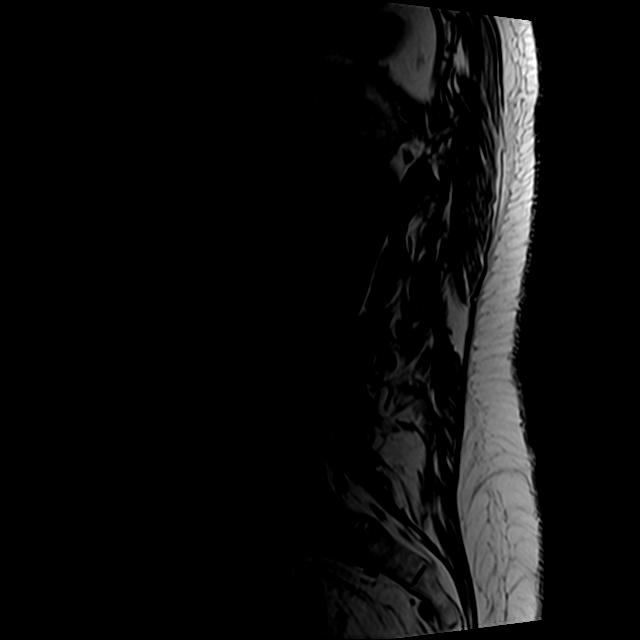
[im 11/17]
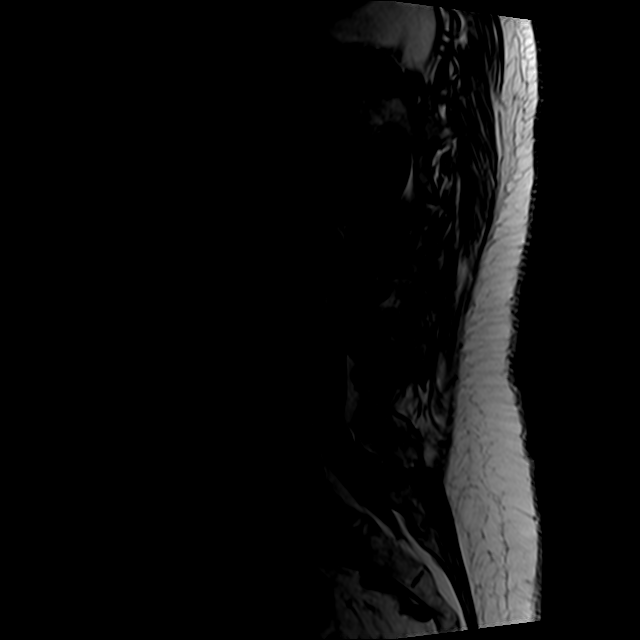
[im 14/17]
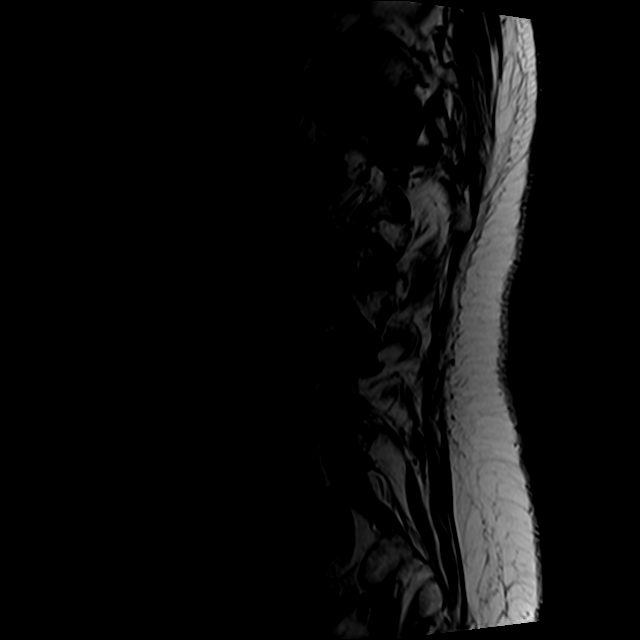

[Series 5: T2 · axial · 4.0mm · 0.78mm/px · z∈[-41,+165]mm · 8 of 36 slices shown (2 of 2)]
[im 1/36]
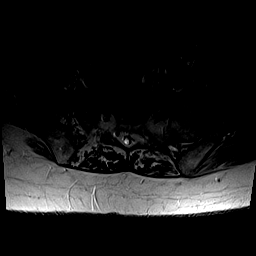
[im 6/36]
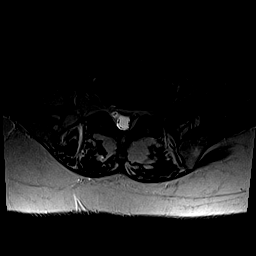
[im 11/36]
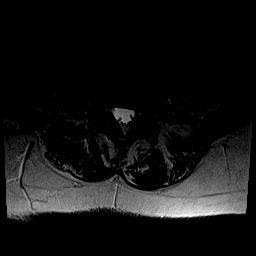
[im 17/36]
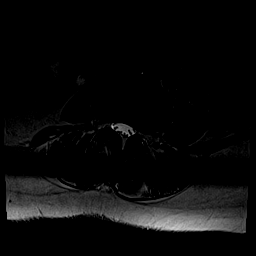
[im 19/36]
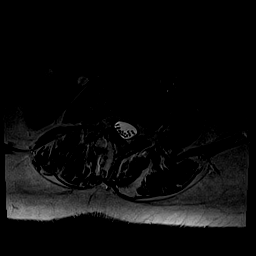
[im 25/36]
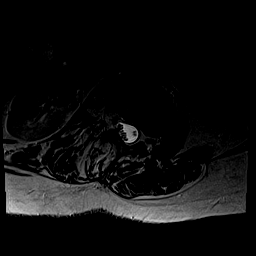
[im 30/36]
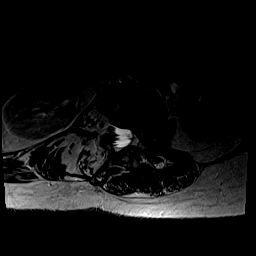
[im 36/36]
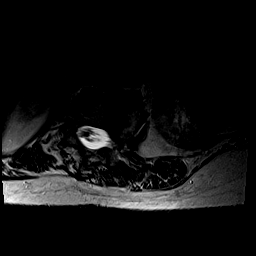

[Series 6: T1 · axial · 4.0mm · 0.31mm/px · z∈[-17,+137]mm · 3 of 36 slices shown (2 of 2)]
[im 6/36]
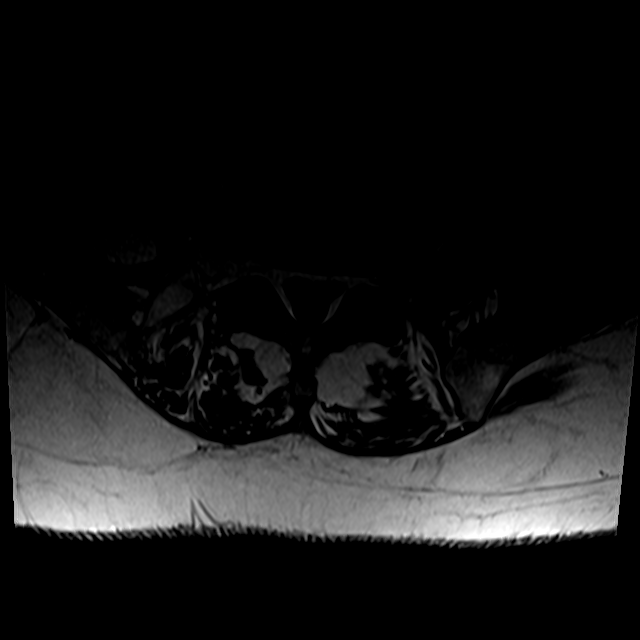
[im 19/36]
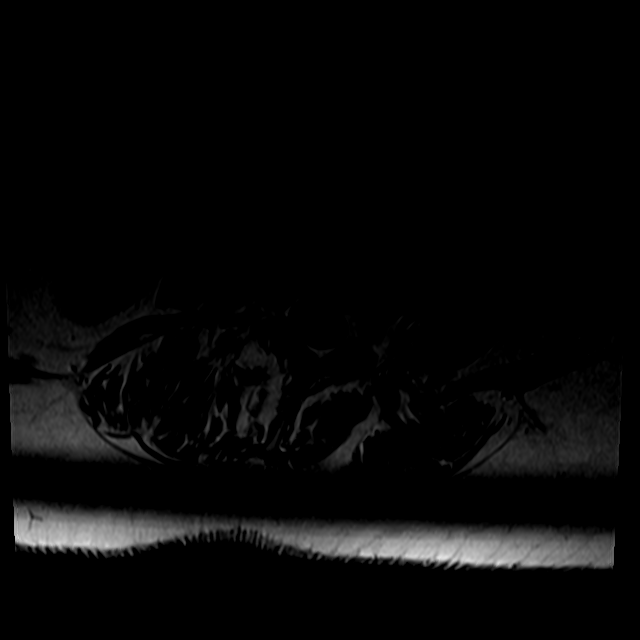
[im 30/36]
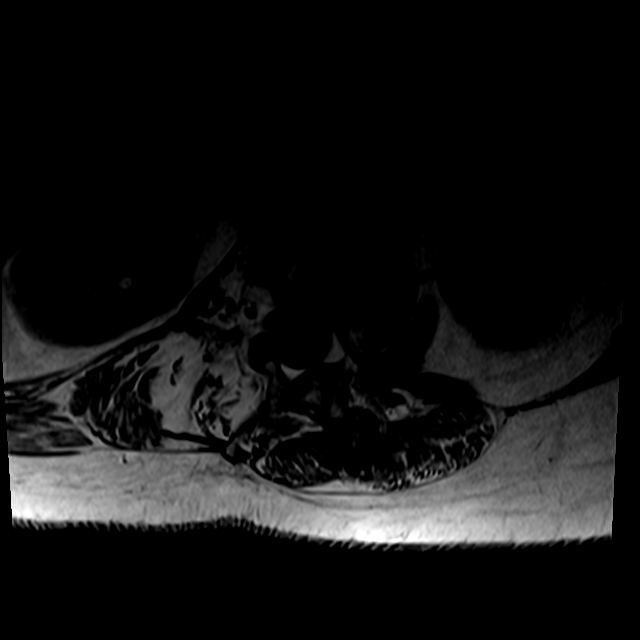

[23 of 48 positions shown; findings below may reference images not displayed]

FINDINGS: Segmentation: There are 6 non-rib-bearing lumbar type vertebra. I do
not have prior thoracic imaging to be able to count the ribs. On
today' s exam I will assume that the T12 ribs are hypoplastic and
that the lowest lumbar type non-rib-bearing vertebra is L5.

Alignment: There is considerable levoconvex scoliosis centered at
L2-3. Rotary component noted. 3 mm degenerative anterolisthesis at
L3-4.

Vertebrae: No significant vertebral edema identified. Mild type 2
degenerative endplate findings at L1- 2, L2- 3, L3-4, and L5-S1.
Small Schmorl's node along the inferior endplate of L4. Disc
desiccation and loss of disc height at L2- 3 and L3-4. The

Conus medullaris: Extends to the L1-2 level and appears normal.

Paraspinal and other soft tissues: Unremarkable

Disc levels:

L1-2:  No impingement.  Mild disc bulge.

L2-3: No impingement. Shallow right paracentral and lateral recess
disc protrusion.

L3-4: Borderline central narrowing of the thecal sac due to disc
bulge and facet arthropathy.

L4-5:  No impingement.  Mild disc bulge.

L5-S1: No impingement. Left eccentric disc bulge with mild
intervertebral spurring.
IMPRESSION: 1. Considerable lumbar scoliosis with mild spondylosis and
degenerative disc disease, but no definite impingement in the lumbar
spine.
2. There are 6 non-rib-bearing lumbar type vertebra. On today's exam
we assume that the T12 ribs are hypoplastic and that the lowest
lumbar type non-rib-bearing vertebra is L5.

## 2016-07-20 ENCOUNTER — Other Ambulatory Visit: Payer: Self-pay | Admitting: Nurse Practitioner

## 2016-07-20 DIAGNOSIS — Z1231 Encounter for screening mammogram for malignant neoplasm of breast: Secondary | ICD-10-CM

## 2016-08-06 ENCOUNTER — Ambulatory Visit
Admission: RE | Admit: 2016-08-06 | Discharge: 2016-08-06 | Disposition: A | Discharge status: Home / Self Care | Payer: BLUE CROSS/BLUE SHIELD | Source: Ambulatory Visit | Attending: Nurse Practitioner | Admitting: Nurse Practitioner

## 2016-08-06 DIAGNOSIS — Z1231 Encounter for screening mammogram for malignant neoplasm of breast: Secondary | ICD-10-CM | POA: Diagnosis present

## 2016-08-06 IMAGING — MG MM DIGITAL SCREENING BILAT W/ CAD
5 series · 5 of 5 positions shown · non-contrast
Comparison: Previous exam(s).

CLINICAL DATA: Screening.

EXAM:
DIGITAL SCREENING BILATERAL MAMMOGRAM WITH CAD

[L MLO]
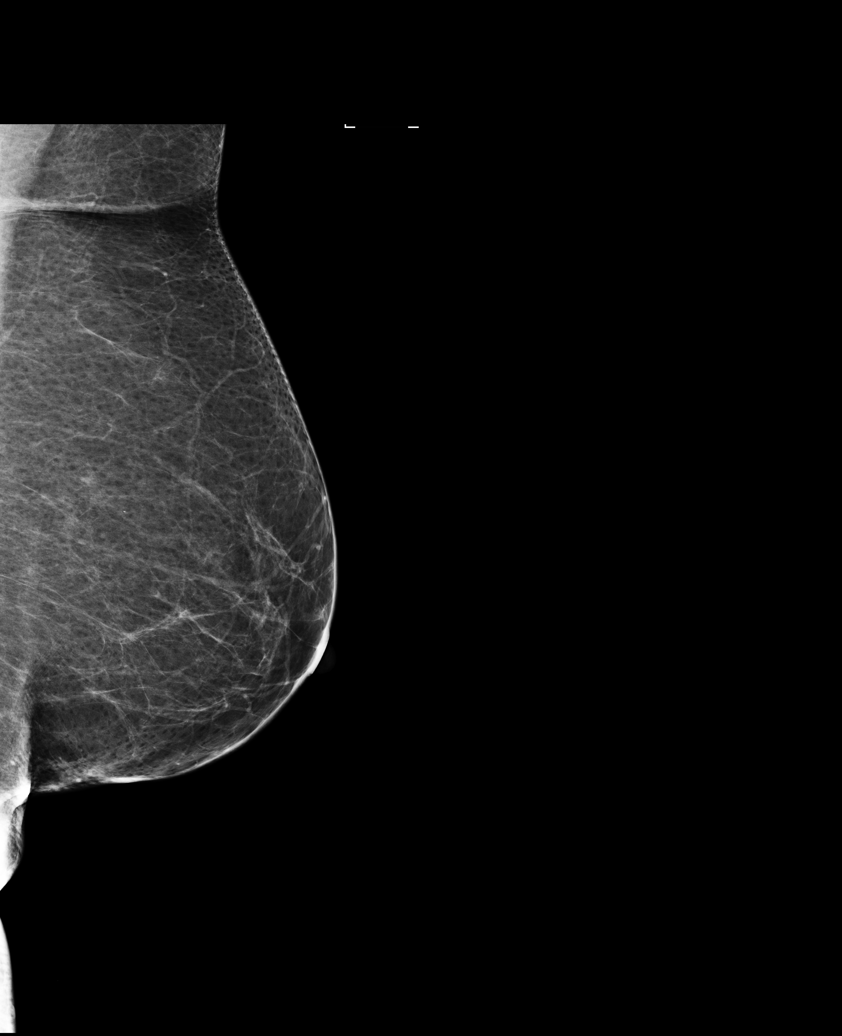

[R CC]
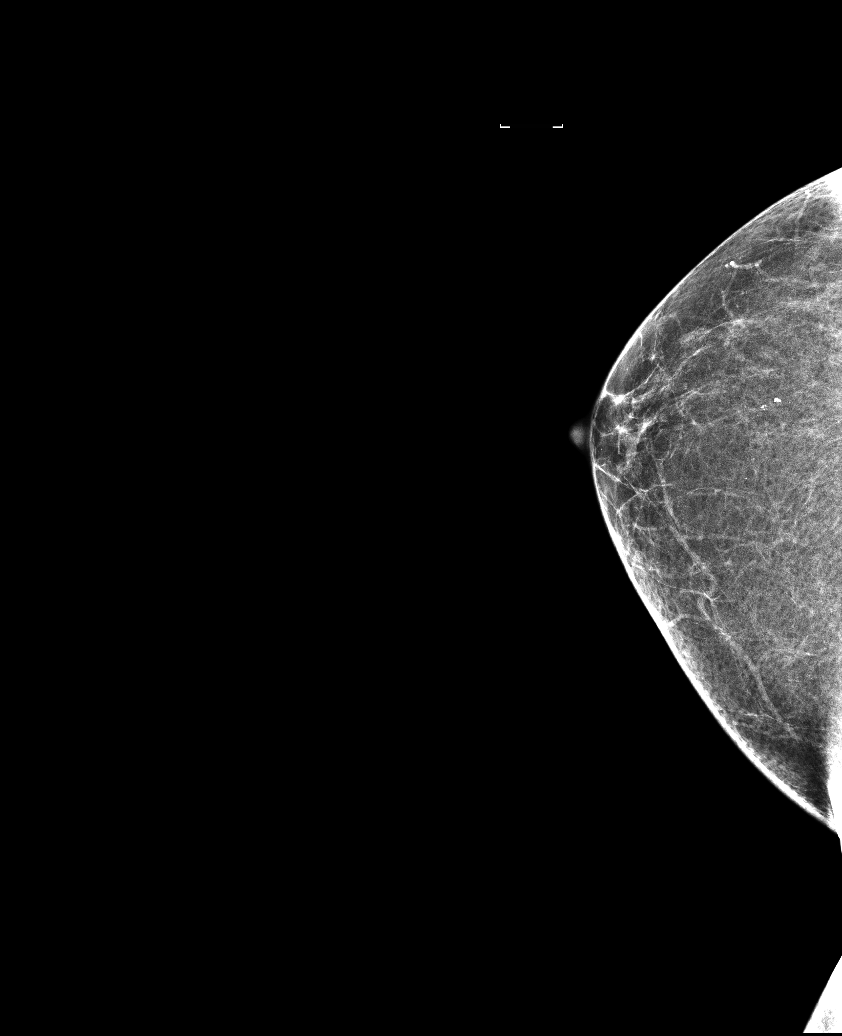

[L CC (1 of 2)]
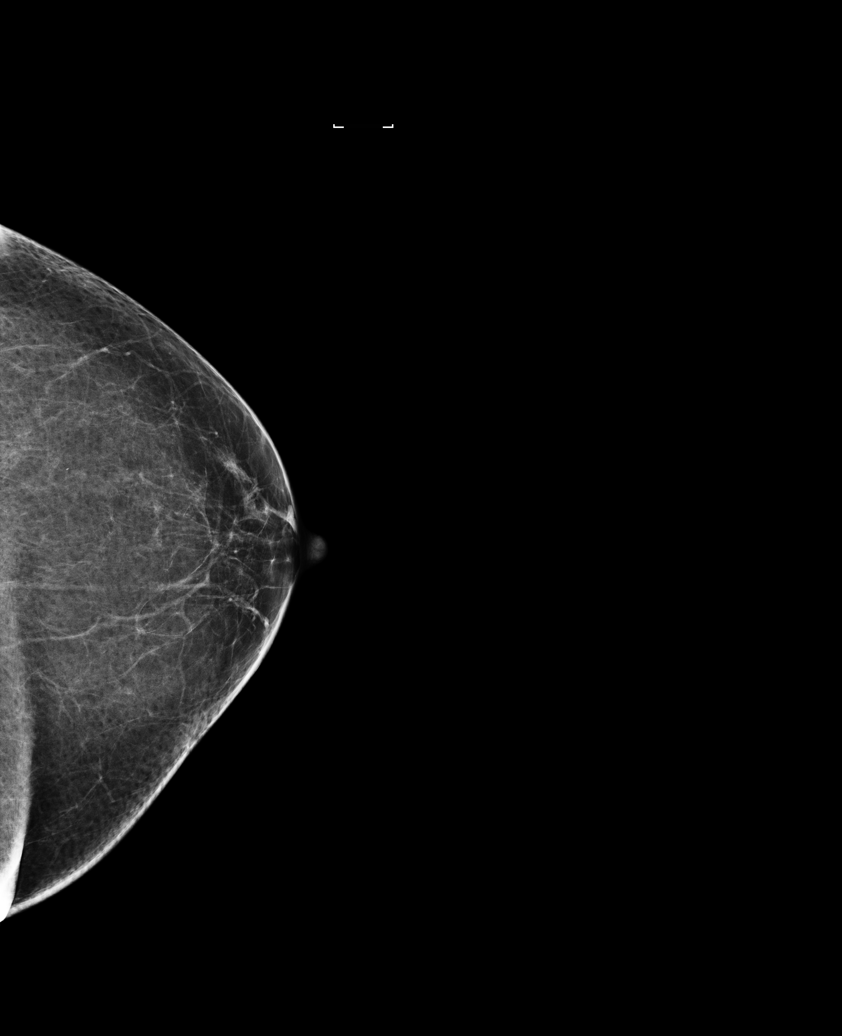

[L CC (2 of 2)]
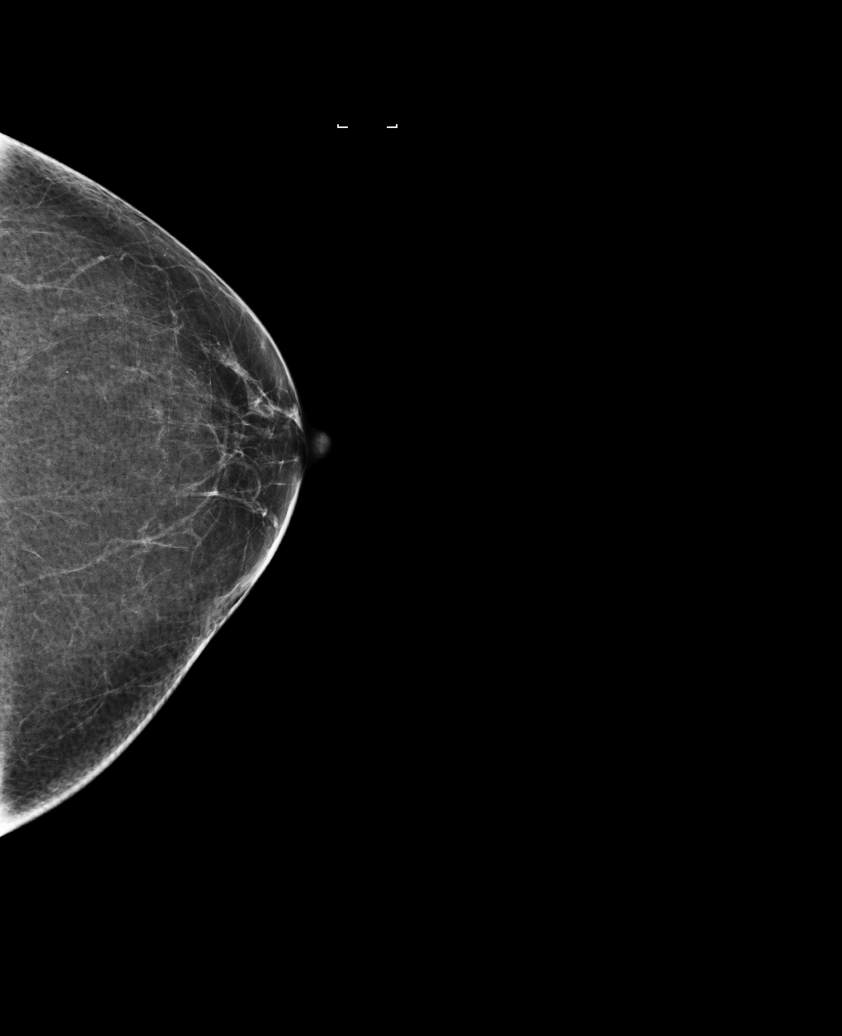

[R MLO]
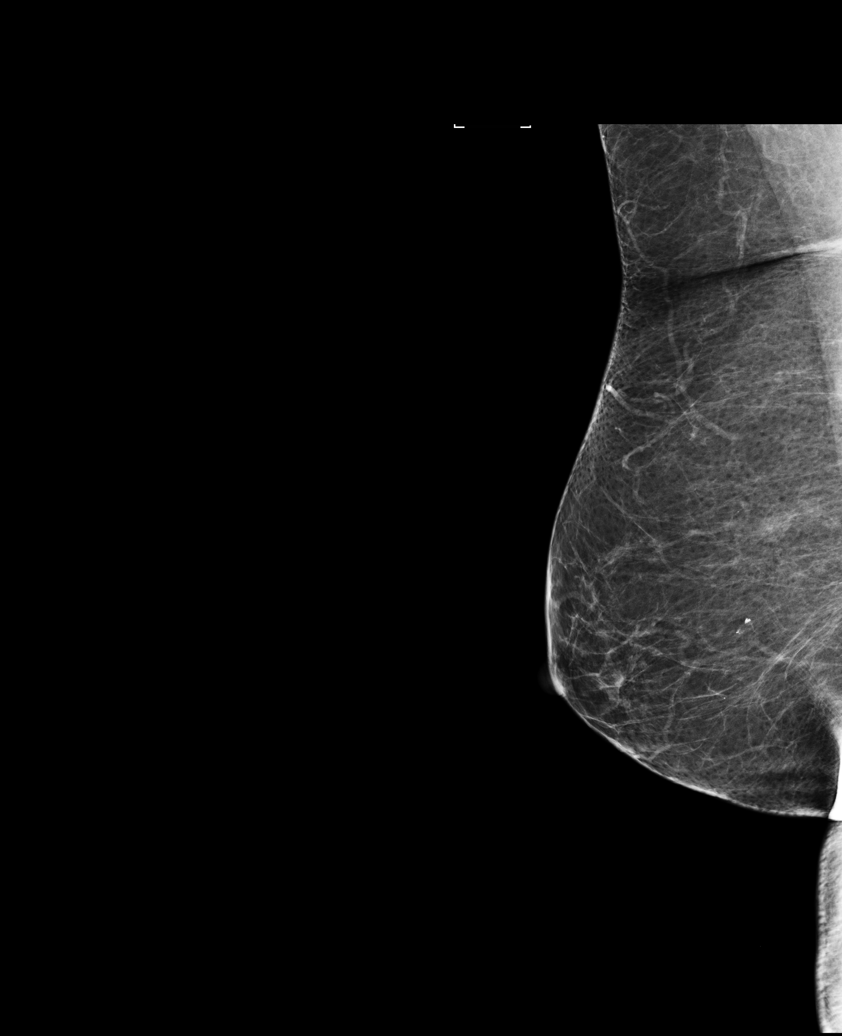

[5 of 5 positions shown; findings below may reference images not displayed]

ACR Breast Density Category b: There are scattered areas of
fibroglandular density.
FINDINGS: There are no findings suspicious for malignancy. Images were
processed with CAD.
IMPRESSION: No mammographic evidence of malignancy. A result letter of this
screening mammogram will be mailed directly to the patient.

RECOMMENDATION:
Screening mammogram in one year. (Code:AS-G-LCT)

BI-RADS CATEGORY  1: Negative.

## 2017-12-27 ENCOUNTER — Encounter: Payer: Self-pay | Admitting: *Deleted

## 2018-01-06 ENCOUNTER — Ambulatory Visit: Payer: Medicare HMO | Admitting: Anesthesiology

## 2018-01-06 ENCOUNTER — Encounter
Admission: RE | Disposition: A | Discharge status: Home / Self Care | Payer: Self-pay | Source: Ambulatory Visit | Attending: Ophthalmology

## 2018-01-06 ENCOUNTER — Encounter: Payer: Self-pay | Admitting: *Deleted

## 2018-01-06 ENCOUNTER — Ambulatory Visit
Admission: RE | Admit: 2018-01-06 | Discharge: 2018-01-06 | Disposition: A | Discharge status: Home / Self Care | Payer: Medicare HMO | Source: Ambulatory Visit | Attending: Ophthalmology | Admitting: Ophthalmology

## 2018-01-06 DIAGNOSIS — H2512 Age-related nuclear cataract, left eye: Secondary | ICD-10-CM | POA: Diagnosis present

## 2018-01-06 DIAGNOSIS — Z888 Allergy status to other drugs, medicaments and biological substances status: Secondary | ICD-10-CM | POA: Insufficient documentation

## 2018-01-06 DIAGNOSIS — M419 Scoliosis, unspecified: Secondary | ICD-10-CM | POA: Diagnosis not present

## 2018-01-06 DIAGNOSIS — K760 Fatty (change of) liver, not elsewhere classified: Secondary | ICD-10-CM | POA: Insufficient documentation

## 2018-01-06 DIAGNOSIS — Z79899 Other long term (current) drug therapy: Secondary | ICD-10-CM | POA: Diagnosis not present

## 2018-01-06 DIAGNOSIS — M1990 Unspecified osteoarthritis, unspecified site: Secondary | ICD-10-CM | POA: Insufficient documentation

## 2018-01-06 DIAGNOSIS — F329 Major depressive disorder, single episode, unspecified: Secondary | ICD-10-CM | POA: Insufficient documentation

## 2018-01-06 HISTORY — PX: CATARACT EXTRACTION W/PHACO: SHX586

## 2018-01-06 HISTORY — DX: Bronchitis, not specified as acute or chronic: J40

## 2018-01-06 HISTORY — DX: Major depressive disorder, single episode, unspecified: F32.9

## 2018-01-06 HISTORY — DX: Depression, unspecified: F32.A

## 2018-01-06 HISTORY — DX: Scoliosis, unspecified: M41.9

## 2018-01-06 HISTORY — DX: Unspecified osteoarthritis, unspecified site: M19.90

## 2018-01-06 SURGERY — PHACOEMULSIFICATION, CATARACT, WITH IOL INSERTION
Anesthesia: Monitor Anesthesia Care | Site: Eye | Laterality: Left | Wound class: Clean

## 2018-01-06 MED ORDER — ARMC OPHTHALMIC DILATING DROPS
1.0000 "application " | OPHTHALMIC | Status: AC
  Administered 2018-01-06 (×3): 1 via OPHTHALMIC

## 2018-01-06 MED ORDER — ONDANSETRON HCL 4 MG/2ML IJ SOLN: 4.0000 mg | Freq: Once | INTRAMUSCULAR | Status: DC | PRN

## 2018-01-06 MED ORDER — SODIUM HYALURONATE 23 MG/ML IO SOLN
INTRAOCULAR | Status: AC
  Filled 2018-01-06: qty 0.6

## 2018-01-06 MED ORDER — MIDAZOLAM HCL 2 MG/2ML IJ SOLN
INTRAMUSCULAR | Status: DC | PRN
  Administered 2018-01-06 (×2): 1 mg via INTRAVENOUS

## 2018-01-06 MED ORDER — POVIDONE-IODINE 5 % OP SOLN
OPHTHALMIC | Status: DC | PRN
  Administered 2018-01-06: 1 via OPHTHALMIC

## 2018-01-06 MED ORDER — POVIDONE-IODINE 5 % OP SOLN
OPHTHALMIC | Status: AC
  Filled 2018-01-06: qty 30

## 2018-01-06 MED ORDER — MOXIFLOXACIN HCL 0.5 % OP SOLN
OPHTHALMIC | Status: DC | PRN
  Administered 2018-01-06: 0.2 mL via OPHTHALMIC

## 2018-01-06 MED ORDER — MOXIFLOXACIN HCL 0.5 % OP SOLN: 1.0000 [drp] | OPHTHALMIC | Status: DC | PRN

## 2018-01-06 MED ORDER — LIDOCAINE HCL (PF) 4 % IJ SOLN
INTRAOCULAR | Status: DC | PRN
  Administered 2018-01-06: 4 mL via OPHTHALMIC

## 2018-01-06 MED ORDER — EPINEPHRINE PF 1 MG/ML IJ SOLN
INTRAOCULAR | Status: DC | PRN
  Administered 2018-01-06: 10:00:00 via OPHTHALMIC

## 2018-01-06 MED ORDER — MIDAZOLAM HCL 2 MG/2ML IJ SOLN
INTRAMUSCULAR | Status: AC
  Filled 2018-01-06: qty 2

## 2018-01-06 MED ORDER — SODIUM HYALURONATE 23 MG/ML IO SOLN
INTRAOCULAR | Status: DC | PRN
  Administered 2018-01-06: 0.6 mL via INTRAOCULAR

## 2018-01-06 MED ORDER — EPINEPHRINE PF 1 MG/ML IJ SOLN
INTRAMUSCULAR | Status: AC
  Filled 2018-01-06: qty 2

## 2018-01-06 MED ORDER — LIDOCAINE HCL (PF) 4 % IJ SOLN
INTRAMUSCULAR | Status: AC
  Filled 2018-01-06: qty 5

## 2018-01-06 MED ORDER — FENTANYL CITRATE (PF) 100 MCG/2ML IJ SOLN: 25.0000 ug | INTRAMUSCULAR | Status: DC | PRN

## 2018-01-06 MED ORDER — ARMC OPHTHALMIC DILATING DROPS
OPHTHALMIC | Status: AC
  Administered 2018-01-06: 1 via OPHTHALMIC
  Filled 2018-01-06: qty 0.4

## 2018-01-06 MED ORDER — SODIUM CHLORIDE 0.9 % IV SOLN
INTRAVENOUS | Status: DC
  Administered 2018-01-06: 07:00:00 via INTRAVENOUS

## 2018-01-06 MED ORDER — SODIUM HYALURONATE 10 MG/ML IO SOLN
INTRAOCULAR | Status: DC | PRN
  Administered 2018-01-06: 0.55 mL via INTRAOCULAR

## 2018-01-06 MED ORDER — MOXIFLOXACIN HCL 0.5 % OP SOLN
OPHTHALMIC | Status: AC
  Filled 2018-01-06: qty 3

## 2018-01-06 SURGICAL SUPPLY — 16 items
DISSECTOR HYDRO NUCLEUS 50X22 (MISCELLANEOUS) ×2 IMPLANT
GLOVE BIO SURGEON STRL SZ8 (GLOVE) ×2 IMPLANT
GLOVE BIOGEL M 6.5 STRL (GLOVE) ×2 IMPLANT
GLOVE SURG LX 7.5 STRW (GLOVE) ×1
GLOVE SURG LX STRL 7.5 STRW (GLOVE) ×1 IMPLANT
GOWN STRL REUS W/ TWL LRG LVL3 (GOWN DISPOSABLE) ×2 IMPLANT
GOWN STRL REUS W/TWL LRG LVL3 (GOWN DISPOSABLE) ×4
LABEL CATARACT MEDS ST (LABEL) ×2 IMPLANT
LENS IOL TECNIS ITEC 27.5 (Intraocular Lens) ×1 IMPLANT
PACK CATARACT (MISCELLANEOUS) ×2 IMPLANT
PACK CATARACT KING (MISCELLANEOUS) ×2 IMPLANT
PACK EYE AFTER SURG (MISCELLANEOUS) ×2 IMPLANT
SOL BSS BAG (MISCELLANEOUS) ×2
SOLUTION BSS BAG (MISCELLANEOUS) ×1 IMPLANT
WATER STERILE IRR 250ML POUR (IV SOLUTION) ×2 IMPLANT
WIPE NON LINTING 3.25X3.25 (MISCELLANEOUS) ×2 IMPLANT

## 2018-01-06 NOTE — Op Note (Signed)
OPERATIVE NOTE  Jennifer Morrow [id number] 01/06/2018   PREOPERATIVE DIAGNOSIS:  Nuclear sclerotic cataract left eye.  H25.12   POSTOPERATIVE DIAGNOSIS:    Nuclear sclerotic cataract left eye.     PROCEDURE:  Phacoemusification with posterior chamber intraocular lens placement of the left eye   LENS:   Implant Name Type Inv. Item Serial No. Manufacturer Lot No. LRB No. Used  LENS IOL DIOP 27.5 - [id number] Intraocular Lens LENS IOL DIOP 27.5 [id number] AMO  Left 1       PCB00 +27.5   ULTRASOUND TIME: 0 minutes 27.5 seconds.  CDE 1.18   SURGEON:  Willey Blade, MD, MPH   ANESTHESIA:  Topical with tetracaine drops augmented with 1% preservative-free intracameral lidocaine.  ESTIMATED BLOOD LOSS: <1 mL   COMPLICATIONS:  None.   DESCRIPTION OF PROCEDURE:  The patient was identified in the holding room and transported to the operating room and placed in the supine position under the operating microscope.  The left eye was identified as the operative eye and it was prepped and draped in the usual sterile ophthalmic fashion.   A 1.0 millimeter clear-corneal paracentesis was made at the 5:00 position. 0.5 ml of preservative-free 1% lidocaine with epinephrine was injected into the anterior chamber.  The anterior chamber was filled with Healon 5 viscoelastic.  A 2.4 millimeter keratome was used to make a near-clear corneal incision at the 2:00 position.  A curvilinear capsulorrhexis was made with a cystotome and capsulorrhexis forceps.  Balanced salt solution was used to hydrodissect and hydrodelineate the nucleus.   Phacoemulsification was then used in stop and chop fashion to remove the lens nucleus and epinucleus.  The remaining cortex was then removed using the irrigation and aspiration handpiece. Healon was then placed into the capsular bag to distend it for lens placement.  A lens was then injected into the capsular bag.  The remaining viscoelastic was aspirated.  The patient had a  moderate brow and bells phenomenon, so the rhexis was decentered slightly inferiorly, but the lens was well centered.   Wounds were hydrated with balanced salt solution.  The anterior chamber was inflated to a physiologic pressure with balanced salt solution.  Intracameral vigamox 0.1 mL undiltued was injected into the eye and a drop placed onto the ocular surface.  No wound leaks were noted.  The patient was taken to the recovery room in stable condition without complications of anesthesia or surgery  Willey Blade 01/06/2018, 10:05 AM

## 2018-01-06 NOTE — Discharge Instructions (Addendum)
Eye Surgery Discharge Instructions  Expect mild scratchy sensation or mild soreness. DO NOT RUB YOUR EYE!  The day of surgery:  Minimal physical activity, but bed rest is not required  No reading, computer work, or close hand work  No bending, lifting, or straining.  May watch TV  For 24 hours:  No driving, legal decisions, or alcoholic beverages  Safety precautions  Eat anything you prefer: It is better to start with liquids, then soup then solid foods.  _____ Eye patch should be worn while sleeping.  ____ Solar shield eyeglasses should be worn for comfort in the sunlight.  Resume all regular medications including aspirin or Coumadin if these were discontinued prior to surgery. You may shower, bathe, shave, or wash your hair. Tylenol may be taken for mild discomfort.  Call your doctor if you experience significant pain, nausea, or vomiting, fever > 101 or other signs of infection. [phone] or [phone] Specific instructions:  Follow-up Information    Nevada Crane, MD Follow up.   Specialty:  Ophthalmology Why:  May 24 at 10:10am in [facility] information: [address] [place] [address] [phone]

## 2018-01-06 NOTE — Transfer of Care (Signed)
Immediate Anesthesia Transfer of Care Note  Patient: Jennifer Morrow  Procedure(s) Performed: CATARACT EXTRACTION PHACO AND INTRAOCULAR LENS PLACEMENT (IOC) (Left Eye)  Patient Location: PACU and Short Stay  Anesthesia Type:MAC  Level of Consciousness: awake  Airway & Oxygen Therapy: Patient Spontanous Breathing  Post-op Assessment: Report given to RN  Post vital signs: stable  Last Vitals:  Vitals Value Taken Time  BP    Temp    Pulse    Resp    SpO2      Last Pain:  Vitals:   01/06/18 0710  TempSrc: Oral  PainSc: 0-No pain         Complications: No apparent anesthesia complications

## 2018-01-06 NOTE — Anesthesia Preprocedure Evaluation (Signed)
Anesthesia Evaluation  Patient identified by MRN, date of birth, ID band Patient awake    Reviewed: Allergy & Precautions, NPO status , Patient's Chart, lab work & pertinent test results  Airway Mallampati: II  TM Distance: >3 FB     Dental   Pulmonary  Bronchitis Hx   Pulmonary exam normal        Cardiovascular negative cardio ROS Normal cardiovascular exam     Neuro/Psych PSYCHIATRIC DISORDERS Depression negative neurological ROS     GI/Hepatic negative GI ROS, Neg liver ROS,   Endo/Other  negative endocrine ROS  Renal/GU negative Renal ROS  negative genitourinary   Musculoskeletal  (+) Arthritis , Osteoarthritis,    Abdominal Normal abdominal exam  (+)   Peds negative pediatric ROS (+)  Hematology negative hematology ROS (+)   Anesthesia Other Findings Past Medical History: No date: Arthritis No date: Bronchitis     Comment:  CHRONIC No date: Depression No date: Scoliosis     Comment:  LIMITS LUNG FUNCTION  Reproductive/Obstetrics                             Anesthesia Physical  Anesthesia Plan  ASA: II  Anesthesia Plan: MAC   Post-op Pain Management:    Induction: Intravenous  PONV Risk Score and Plan:   Airway Management Planned: Nasal Cannula  Additional Equipment:   Intra-op Plan:   Post-operative Plan:   Informed Consent: I have reviewed the patients History and Physical, chart, labs and discussed the procedure including the risks, benefits and alternatives for the proposed anesthesia with the patient or authorized representative who has indicated his/her understanding and acceptance.   Dental advisory given  Plan Discussed with: CRNA and Surgeon  Anesthesia Plan Comments:         Anesthesia Quick Evaluation  

## 2018-01-06 NOTE — H&P (Signed)
The History and Physical notes are on paper, have been signed, and are to be scanned.   I have examined the patient and there are no changes to the H&P.   Willey Blade 01/06/2018 9:30 AM

## 2018-01-06 NOTE — Anesthesia Post-op Follow-up Note (Signed)
Anesthesia QCDR form completed.        

## 2018-01-07 NOTE — Anesthesia Postprocedure Evaluation (Signed)
Anesthesia Post Note  Patient: Jennifer Morrow  Procedure(s) Performed: CATARACT EXTRACTION PHACO AND INTRAOCULAR LENS PLACEMENT ([place]) (Left Eye)  Patient location during evaluation: PACU Anesthesia Type: MAC Level of consciousness: awake and alert and oriented Pain management: pain level controlled Vital Signs Assessment: post-procedure vital signs reviewed and stable Respiratory status: spontaneous breathing Cardiovascular status: blood pressure returned to baseline Anesthetic complications: no     Last Vitals:  Vitals:   01/06/18 1004 01/06/18 1010  BP: 106/69 122/63  Pulse: (!) 53   Resp: 18   Temp: 36.7 C   SpO2: 100%     Last Pain:  Vitals:   01/06/18 0710  TempSrc: Oral  PainSc: 0-No pain                 Jenna Routzahn

## 2018-02-08 ENCOUNTER — Encounter: Payer: Self-pay | Admitting: *Deleted

## 2018-02-10 ENCOUNTER — Ambulatory Visit: Payer: Medicare HMO | Admitting: Certified Registered"

## 2018-02-10 ENCOUNTER — Ambulatory Visit
Admission: RE | Admit: 2018-02-10 | Discharge: 2018-02-10 | Disposition: A | Discharge status: Home / Self Care | Payer: Medicare HMO | Source: Ambulatory Visit | Attending: Ophthalmology | Admitting: Ophthalmology

## 2018-02-10 ENCOUNTER — Encounter
Admission: RE | Disposition: A | Discharge status: Home / Self Care | Payer: Self-pay | Source: Ambulatory Visit | Attending: Ophthalmology

## 2018-02-10 DIAGNOSIS — H2511 Age-related nuclear cataract, right eye: Secondary | ICD-10-CM | POA: Diagnosis present

## 2018-02-10 DIAGNOSIS — Z888 Allergy status to other drugs, medicaments and biological substances status: Secondary | ICD-10-CM | POA: Insufficient documentation

## 2018-02-10 DIAGNOSIS — F329 Major depressive disorder, single episode, unspecified: Secondary | ICD-10-CM | POA: Diagnosis not present

## 2018-02-10 DIAGNOSIS — M419 Scoliosis, unspecified: Secondary | ICD-10-CM | POA: Insufficient documentation

## 2018-02-10 DIAGNOSIS — Z79899 Other long term (current) drug therapy: Secondary | ICD-10-CM | POA: Insufficient documentation

## 2018-02-10 DIAGNOSIS — M1990 Unspecified osteoarthritis, unspecified site: Secondary | ICD-10-CM | POA: Diagnosis not present

## 2018-02-10 HISTORY — PX: CATARACT EXTRACTION W/PHACO: SHX586

## 2018-02-10 HISTORY — DX: Spinal stenosis, site unspecified: M48.00

## 2018-02-10 SURGERY — PHACOEMULSIFICATION, CATARACT, WITH IOL INSERTION
Anesthesia: Monitor Anesthesia Care | Site: Eye | Laterality: Right | Wound class: Clean

## 2018-02-10 MED ORDER — MOXIFLOXACIN HCL 0.5 % OP SOLN: 1.0000 [drp] | OPHTHALMIC | Status: DC | PRN

## 2018-02-10 MED ORDER — MIDAZOLAM HCL 2 MG/2ML IJ SOLN
INTRAMUSCULAR | Status: DC | PRN
  Administered 2018-02-10: 2 mg via INTRAVENOUS

## 2018-02-10 MED ORDER — POVIDONE-IODINE 5 % OP SOLN
OPHTHALMIC | Status: DC | PRN
  Administered 2018-02-10: 1 via OPHTHALMIC

## 2018-02-10 MED ORDER — EPINEPHRINE PF 1 MG/ML IJ SOLN
INTRAMUSCULAR | Status: AC
  Filled 2018-02-10: qty 2

## 2018-02-10 MED ORDER — MOXIFLOXACIN HCL 0.5 % OP SOLN
OPHTHALMIC | Status: DC | PRN
  Administered 2018-02-10: 0.2 mL via OPHTHALMIC

## 2018-02-10 MED ORDER — POVIDONE-IODINE 5 % OP SOLN
OPHTHALMIC | Status: AC
  Filled 2018-02-10: qty 30

## 2018-02-10 MED ORDER — SODIUM CHLORIDE 0.9 % IV SOLN
INTRAVENOUS | Status: DC
  Administered 2018-02-10: 09:00:00 via INTRAVENOUS

## 2018-02-10 MED ORDER — SODIUM HYALURONATE 23 MG/ML IO SOLN
INTRAOCULAR | Status: AC
  Filled 2018-02-10: qty 0.6

## 2018-02-10 MED ORDER — LIDOCAINE HCL (PF) 4 % IJ SOLN
INTRAOCULAR | Status: DC | PRN
  Administered 2018-02-10: 4 mL via OPHTHALMIC

## 2018-02-10 MED ORDER — MIDAZOLAM HCL 2 MG/2ML IJ SOLN
INTRAMUSCULAR | Status: AC
  Filled 2018-02-10: qty 2

## 2018-02-10 MED ORDER — ARMC OPHTHALMIC DILATING DROPS
OPHTHALMIC | Status: AC
  Administered 2018-02-10: 1 via OPHTHALMIC
  Filled 2018-02-10: qty 0.4

## 2018-02-10 MED ORDER — ARMC OPHTHALMIC DILATING DROPS
1.0000 "application " | OPHTHALMIC | Status: AC
  Administered 2018-02-10 (×3): 1 via OPHTHALMIC

## 2018-02-10 MED ORDER — MOXIFLOXACIN HCL 0.5 % OP SOLN
OPHTHALMIC | Status: AC
  Filled 2018-02-10: qty 3

## 2018-02-10 MED ORDER — SODIUM HYALURONATE 23 MG/ML IO SOLN
INTRAOCULAR | Status: DC | PRN
  Administered 2018-02-10: 0.6 mL via INTRAOCULAR

## 2018-02-10 MED ORDER — EPINEPHRINE PF 1 MG/ML IJ SOLN
INTRAOCULAR | Status: DC | PRN
  Administered 2018-02-10: 09:00:00 via OPHTHALMIC

## 2018-02-10 MED ORDER — FENTANYL CITRATE (PF) 100 MCG/2ML IJ SOLN: 25.0000 ug | INTRAMUSCULAR | Status: DC | PRN

## 2018-02-10 MED ORDER — SODIUM HYALURONATE 10 MG/ML IO SOLN
INTRAOCULAR | Status: DC | PRN
  Administered 2018-02-10: 0.55 mL via INTRAOCULAR

## 2018-02-10 MED ORDER — LIDOCAINE HCL (PF) 4 % IJ SOLN
INTRAMUSCULAR | Status: AC
  Filled 2018-02-10: qty 5

## 2018-02-10 MED ORDER — ONDANSETRON HCL 4 MG/2ML IJ SOLN: 4.0000 mg | Freq: Once | INTRAMUSCULAR | Status: DC | PRN

## 2018-02-10 SURGICAL SUPPLY — 16 items
DISSECTOR HYDRO NUCLEUS 50X22 (MISCELLANEOUS) ×2 IMPLANT
GLOVE BIO SURGEON STRL SZ8 (GLOVE) ×2 IMPLANT
GLOVE BIOGEL M 6.5 STRL (GLOVE) ×2 IMPLANT
GLOVE SURG LX 7.5 STRW (GLOVE) ×1
GLOVE SURG LX STRL 7.5 STRW (GLOVE) ×1 IMPLANT
GOWN STRL REUS W/ TWL LRG LVL3 (GOWN DISPOSABLE) ×2 IMPLANT
GOWN STRL REUS W/TWL LRG LVL3 (GOWN DISPOSABLE) ×4
LABEL CATARACT MEDS ST (LABEL) ×2 IMPLANT
LENS IOL TECNIS ITEC 27.5 (Intraocular Lens) ×1 IMPLANT
PACK CATARACT (MISCELLANEOUS) ×2 IMPLANT
PACK CATARACT KING (MISCELLANEOUS) ×2 IMPLANT
PACK EYE AFTER SURG (MISCELLANEOUS) ×2 IMPLANT
SOL BSS BAG (MISCELLANEOUS) ×2
SOLUTION BSS BAG (MISCELLANEOUS) ×1 IMPLANT
WATER STERILE IRR 250ML POUR (IV SOLUTION) ×2 IMPLANT
WIPE NON LINTING 3.25X3.25 (MISCELLANEOUS) ×2 IMPLANT

## 2018-02-10 NOTE — Anesthesia Preprocedure Evaluation (Signed)
Anesthesia Evaluation  Patient identified by MRN, date of birth, ID band Patient awake    Reviewed: Allergy & Precautions, NPO status , Patient's Chart, lab work & pertinent test results  Airway Mallampati: II  TM Distance: >3 FB     Dental   Pulmonary  Bronchitis Hx   Pulmonary exam normal        Cardiovascular negative cardio ROS Normal cardiovascular exam     Neuro/Psych PSYCHIATRIC DISORDERS Depression negative neurological ROS     GI/Hepatic negative GI ROS, Neg liver ROS,   Endo/Other  negative endocrine ROS  Renal/GU negative Renal ROS  negative genitourinary   Musculoskeletal  (+) Arthritis , Osteoarthritis,    Abdominal Normal abdominal exam  (+)   Peds negative pediatric ROS (+)  Hematology negative hematology ROS (+)   Anesthesia Other Findings Past Medical History: No date: Arthritis No date: Bronchitis     Comment:  CHRONIC No date: Depression No date: Scoliosis     Comment:  LIMITS LUNG FUNCTION  Reproductive/Obstetrics                             Anesthesia Physical  Anesthesia Plan  ASA: II  Anesthesia Plan: MAC   Post-op Pain Management:    Induction: Intravenous  PONV Risk Score and Plan:   Airway Management Planned: Nasal Cannula  Additional Equipment:   Intra-op Plan:   Post-operative Plan:   Informed Consent: I have reviewed the patients History and Physical, chart, labs and discussed the procedure including the risks, benefits and alternatives for the proposed anesthesia with the patient or authorized representative who has indicated his/her understanding and acceptance.   Dental advisory given  Plan Discussed with: CRNA and Surgeon  Anesthesia Plan Comments:         Anesthesia Quick Evaluation

## 2018-02-10 NOTE — Anesthesia Post-op Follow-up Note (Signed)
Anesthesia QCDR form completed.        

## 2018-02-10 NOTE — Discharge Instructions (Signed)
Eye Surgery Discharge Instructions    Expect mild scratchy sensation or mild soreness. DO NOT RUB YOUR EYE!  The day of surgery:  Minimal physical activity, but bed rest is not required  No reading, computer work, or close hand work  No bending, lifting, or straining.  May watch TV  For 24 hours:  No driving, legal decisions, or alcoholic beverages  Safety precautions  Eat anything you prefer: It is better to start with liquids, then soup then solid foods.  ____post op eye drop sheet discussed and given to pt.  ____ Solar shield eyeglasses should be worn for comfort in the sunlight/patch while sleeping  Resume all regular medications including aspirin or Coumadin if these were discontinued prior to surgery. You may shower, bathe, shave, or wash your hair. Tylenol may be taken for mild discomfort.  Call your doctor if you experience significant pain, nausea, or vomiting, fever > 101 or other signs of infection. [phone] or [phone] Specific instructions:  Follow-up Information    [place] CraneKing, Bradley Mark, MD Follow up.   Specialty:  Ophthalmology Why:  [facility] Contact information: [address] [address] [place] [address] [place] [id number] [phone]

## 2018-02-10 NOTE — Anesthesia Postprocedure Evaluation (Signed)
Anesthesia Post Note  Patient: Jennifer Morrow  Procedure(s) Performed: CATARACT EXTRACTION PHACO AND INTRAOCULAR LENS PLACEMENT ([place]) (Right Eye)  Patient location during evaluation: PACU Anesthesia Type: MAC Level of consciousness: awake, awake and alert and oriented Pain management: pain level controlled Vital Signs Assessment: post-procedure vital signs reviewed and stable Respiratory status: spontaneous breathing, nonlabored ventilation and respiratory function stable Cardiovascular status: stable Anesthetic complications: no     Last Vitals:  Vitals:   02/10/18 0743 02/10/18 0903  BP: 109/62 (!) 107/54  Pulse: 67 60  Resp: 15 16  Temp: (!) 35.7 C 36.7 C  SpO2: 99% 99%    Last Pain:  Vitals:   02/10/18 0903  TempSrc: Oral  PainSc: 0-No pain                 [name]

## 2018-02-10 NOTE — H&P (Signed)
The History and Physical notes are on paper, have been signed, and are to be scanned.   I have examined the patient and there are no changes to the H&P.   Willey BladeBradley King 02/10/2018 8:29 AM

## 2018-02-10 NOTE — Transfer of Care (Signed)
Immediate Anesthesia Transfer of Care Note  Patient: Jennifer Morrow  Procedure(s) Performed: CATARACT EXTRACTION PHACO AND INTRAOCULAR LENS PLACEMENT (IOC) (Right Eye)  Patient Location: PACU  Anesthesia Type:MAC  Level of Consciousness: awake, alert  and oriented  Airway & Oxygen Therapy: Patient Spontanous Breathing  Post-op Assessment: Report given to RN and Post -op Vital signs reviewed and stable  Post vital signs: Reviewed and stable  Last Vitals:  Vitals Value Taken Time  BP 107/54 02/10/2018  9:03 AM  Temp 36.7 C 02/10/2018  9:03 AM  Pulse 60 02/10/2018  9:03 AM  Resp 16 02/10/2018  9:03 AM  SpO2 99 % 02/10/2018  9:03 AM    Last Pain:  Vitals:   02/10/18 0903  TempSrc: Oral  PainSc: 0-No pain         Complications: No apparent anesthesia complications

## 2018-02-10 NOTE — Op Note (Signed)
OPERATIVE NOTE  Jennifer ClossRuth W Morrow [id number] 02/10/2018   PREOPERATIVE DIAGNOSIS:  Nuclear sclerotic cataract right eye.  H25.11   POSTOPERATIVE DIAGNOSIS:    Nuclear sclerotic cataract right eye.     PROCEDURE:  Phacoemusification with posterior chamber intraocular lens placement of the right eye   LENS:   Implant Name Type Inv. Item Serial No. Manufacturer Lot No. LRB No. Used  LENS IOL DIOP 27.5 - [id number] Intraocular Lens LENS IOL DIOP 27.5 [id number]  Right 1       PCB00 +27.5   ULTRASOUND TIME: 0 minutes 30 seconds.  CDE 1.51   SURGEON:  Jennifer BladeBradley Aaisha Sliter, Jennifer Morrow, Jennifer Morrow  ANESTHESIOLOGIST: Anesthesiologist: Yves Dillarroll, Paul, MD CRNA: Disser, Anne NgAndrew L, CRNA; Junious SilkNoles, Mark, CRNA   ANESTHESIA:  Topical with tetracaine drops augmented with 1% preservative-free intracameral lidocaine.  ESTIMATED BLOOD LOSS: less than 1 mL.   COMPLICATIONS:  None.   DESCRIPTION OF PROCEDURE:  The patient was identified in the holding room and transported to the operating room and placed in the supine position under the operating microscope.  The right eye was identified as the operative eye and it was prepped and draped in the usual sterile ophthalmic fashion.   A 1.0 millimeter clear-corneal paracentesis was made at the 10:30 position. 0.5 ml of preservative-free 1% lidocaine with epinephrine was injected into the anterior chamber.  The anterior chamber was filled with Healon 5 viscoelastic.  A 2.4 millimeter keratome was used to make a near-clear corneal incision at the 8:00 position.  A curvilinear capsulorrhexis was made with a cystotome and capsulorrhexis forceps.  Balanced salt solution was used to hydrodissect and hydrodelineate the nucleus.   Phacoemulsification was then used in stop and chop fashion to remove the lens nucleus and epinucleus.  The remaining cortex was then removed using the irrigation and aspiration handpiece. Healon was then placed into the capsular bag to distend it for lens  placement.  A lens was then injected into the capsular bag.  The remaining viscoelastic was aspirated.   Wounds were hydrated with balanced salt solution.  The anterior chamber was inflated to a physiologic pressure with balanced salt solution.   Intracameral vigamox 0.1 mL undiluted was injected into the eye and a drop placed onto the ocular surface.  No wound leaks were noted.  The patient was taken to the recovery room in stable condition without complications of anesthesia or surgery  Jennifer Morrow 02/10/2018, 9:01 AM

## 2018-02-11 ENCOUNTER — Encounter: Payer: Self-pay | Admitting: Ophthalmology

## 2018-02-11 NOTE — Addendum Note (Signed)
Addendum  created 02/11/18 0928 by Cartel Mauss L, CRNA   Charge Capture section accepted    

## 2018-02-11 NOTE — Addendum Note (Signed)
Addendum  created 02/11/18 1150 by Ki Corbo, CRNA   Charge Capture section accepted    

## 2018-07-26 ENCOUNTER — Other Ambulatory Visit: Payer: Self-pay | Admitting: Internal Medicine

## 2018-07-26 DIAGNOSIS — Z1231 Encounter for screening mammogram for malignant neoplasm of breast: Secondary | ICD-10-CM

## 2018-08-02 ENCOUNTER — Encounter (INDEPENDENT_AMBULATORY_CARE_PROVIDER_SITE_OTHER): Payer: Self-pay

## 2018-08-02 ENCOUNTER — Ambulatory Visit
Admission: RE | Admit: 2018-08-02 | Discharge: 2018-08-02 | Disposition: A | Discharge status: Home / Self Care | Payer: Medicare HMO | Source: Ambulatory Visit | Attending: Internal Medicine | Admitting: Internal Medicine

## 2018-08-02 DIAGNOSIS — Z1231 Encounter for screening mammogram for malignant neoplasm of breast: Secondary | ICD-10-CM | POA: Diagnosis present

## 2018-08-02 IMAGING — MG DIGITAL SCREENING BILATERAL MAMMOGRAM WITH TOMO AND CAD
8 series · 8 of 24 positions shown · non-contrast
Comparison: Previous exam(s).

CLINICAL DATA: Screening.

EXAM:
DIGITAL SCREENING BILATERAL MAMMOGRAM WITH TOMO AND CAD

[L CC synth-2D]
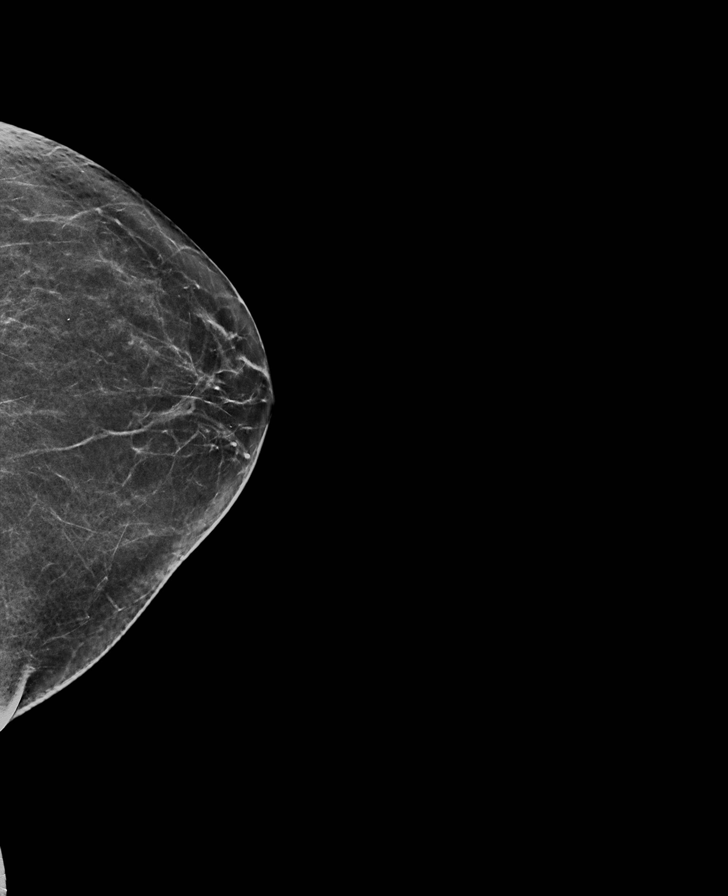

[L MLO synth-2D]
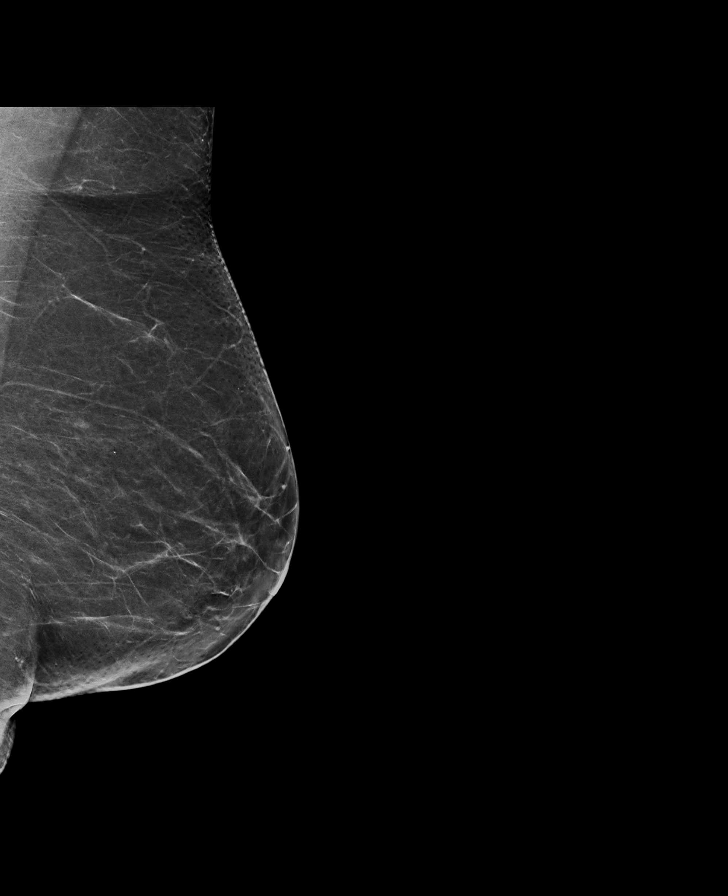

[R CC synth-2D]
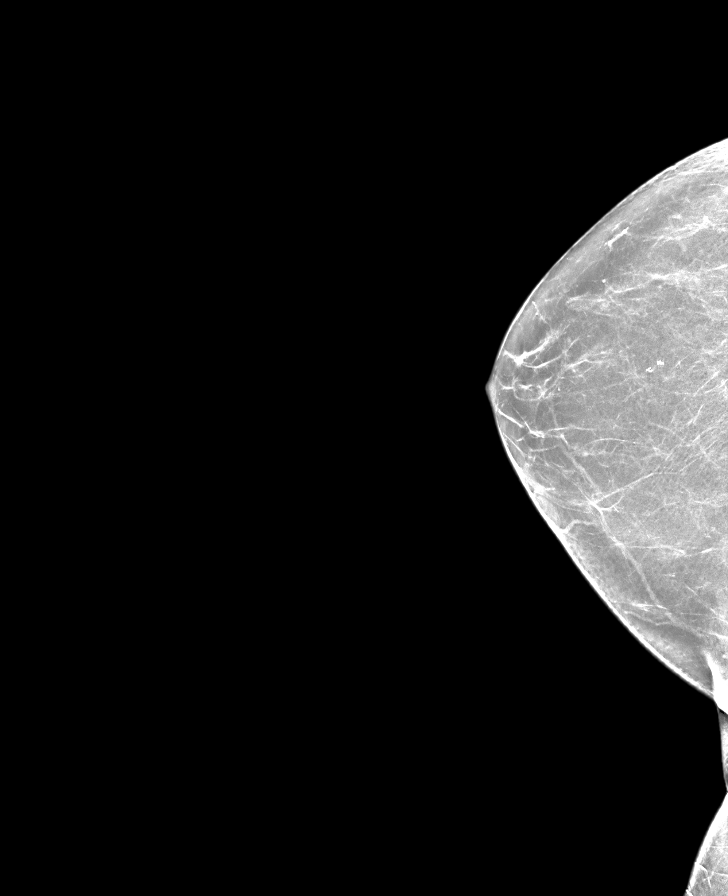

[R MLO synth-2D]
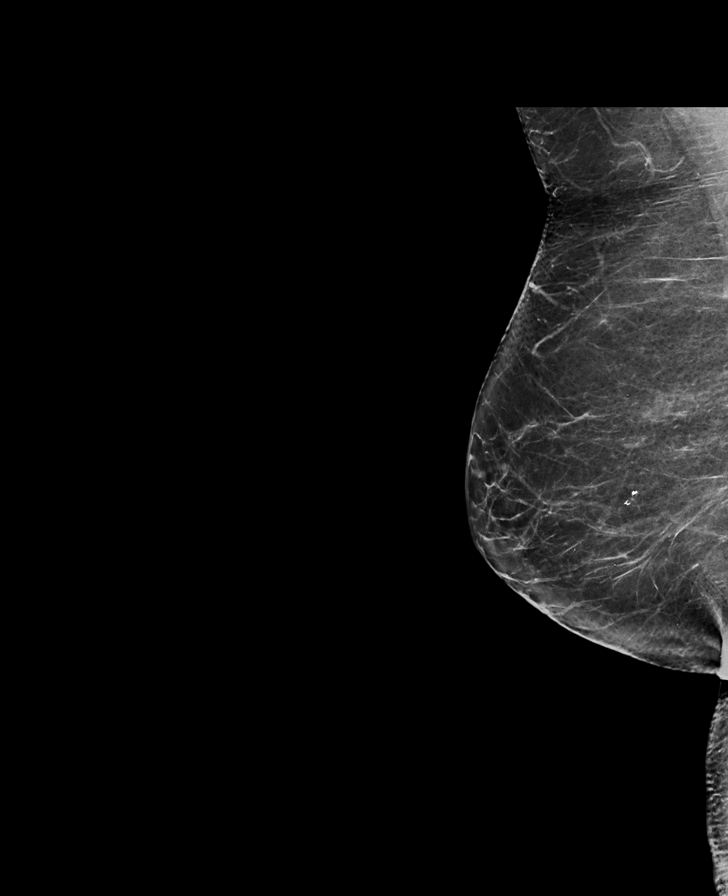

[L MLO tomo · tomo slice 38/75.0]
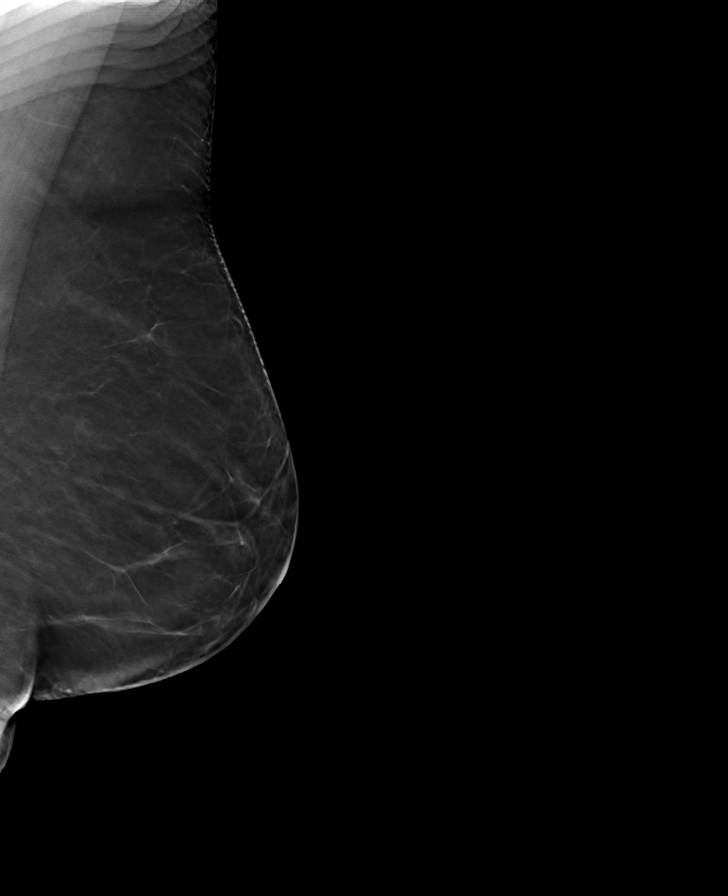

[R CC tomo · tomo slice 31/61.0]
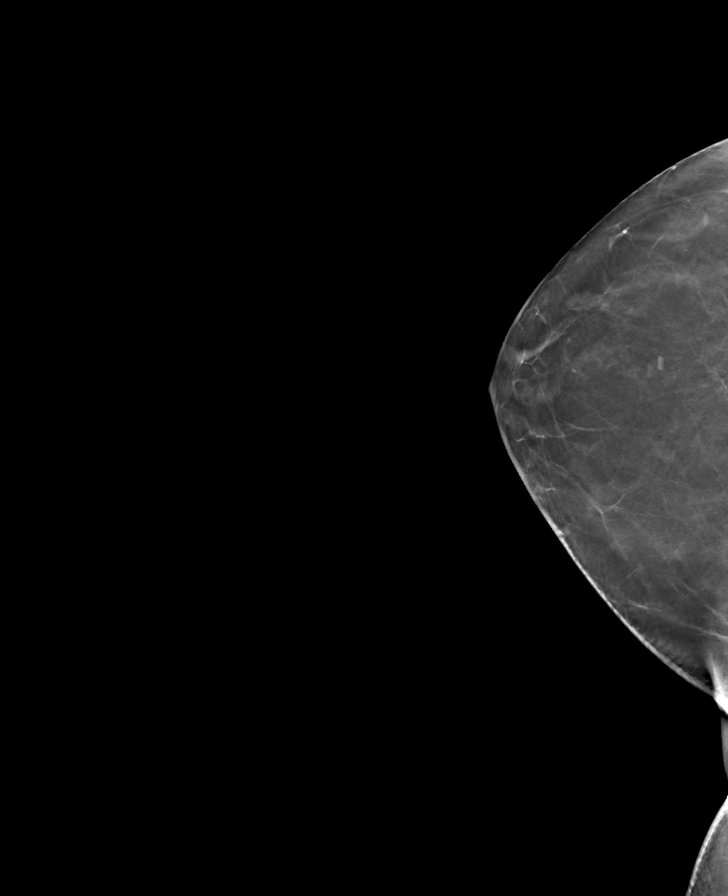

[L CC tomo · tomo slice 32/63.0]
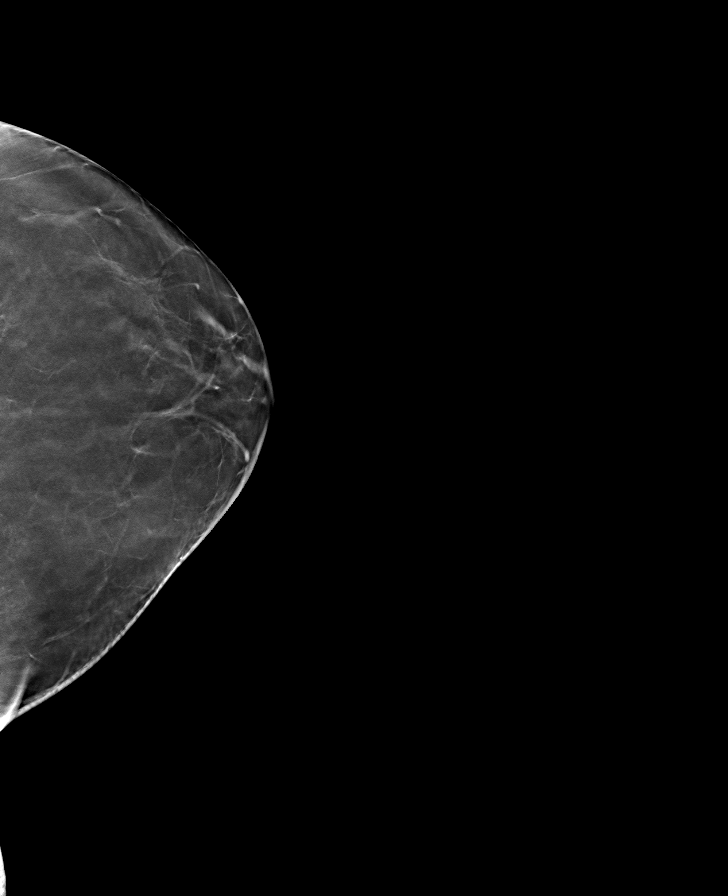

[R MLO tomo · tomo slice 37/72.0]
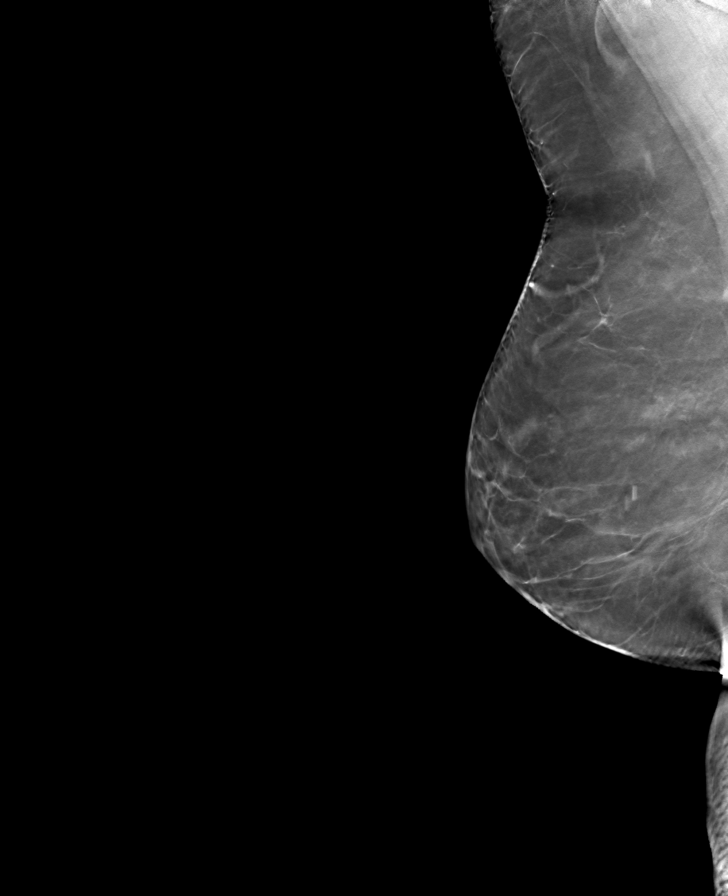

[8 of 24 positions shown; findings below may reference images not displayed]

ACR Breast Density Category b: There are scattered areas of
fibroglandular density.
FINDINGS: There are no findings suspicious for malignancy. Images were
processed with CAD.
IMPRESSION: No mammographic evidence of malignancy. A result letter of this
screening mammogram will be mailed directly to the patient.

RECOMMENDATION:
Screening mammogram in one year. (Code:CN-U-775)

BI-RADS CATEGORY  1: Negative.

## 2019-09-09 ENCOUNTER — Ambulatory Visit: Payer: Medicare HMO | Attending: Internal Medicine

## 2019-09-09 DIAGNOSIS — Z23 Encounter for immunization: Secondary | ICD-10-CM | POA: Insufficient documentation

## 2019-09-09 NOTE — Progress Notes (Signed)
   Covid-19 Vaccination Clinic  Name:  Jennifer Morrow    MRN: [id number] DOB: [birth year]  09/09/2019  Ms. [name] was observed post Covid-19 immunization for 15 minutes without incidence. She was provided with Vaccine Information Sheet and instruction to access the V-Safe system.   Ms. [name] was instructed to call 911 with any severe reactions post vaccine: Marland Kitchen Difficulty breathing  . Swelling of your face and throat  . A fast heartbeat  . A bad rash all over your body  . Dizziness and weakness    Immunizations Administered    Name Date Dose VIS Date Route   Pfizer COVID-19 Vaccine 09/09/2019  3:17 PM 0.3 mL 07/28/2019 Intramuscular   Manufacturer: [employer], [employer]   Lot: [id number]   NDC: [address]-1

## 2019-09-15 ENCOUNTER — Ambulatory Visit: Payer: Medicare HMO

## 2019-09-20 ENCOUNTER — Ambulatory Visit: Payer: Medicare HMO

## 2019-10-01 ENCOUNTER — Ambulatory Visit: Payer: Medicare HMO | Attending: Internal Medicine

## 2019-10-01 DIAGNOSIS — Z23 Encounter for immunization: Secondary | ICD-10-CM | POA: Insufficient documentation

## 2019-10-01 NOTE — Progress Notes (Signed)
   Covid-19 Vaccination Clinic  Name:  SHINE SCROGHAM    MRN: [id number] DOB: [birth year]  10/01/2019  Ms. Willenbring was observed post Covid-19 immunization for 15 minutes without incidence. She was provided with Vaccine Information Sheet and instruction to access the V-Safe system.   Ms. Stallsmith was instructed to call 911 with any severe reactions post vaccine: Marland Kitchen Difficulty breathing  . Swelling of your face and throat  . A fast heartbeat  . A bad rash all over your body  . Dizziness and weakness    Immunizations Administered    Name Date Dose VIS Date Route   Pfizer COVID-19 Vaccine 10/01/2019  1:22 PM 0.3 mL 07/28/2019 Intramuscular   Manufacturer: [employer], [employer]   Lot: [id number]   NDC: [address]-9

## 2020-07-29 ENCOUNTER — Other Ambulatory Visit: Payer: Self-pay | Admitting: Internal Medicine

## 2020-07-29 DIAGNOSIS — Z1231 Encounter for screening mammogram for malignant neoplasm of breast: Secondary | ICD-10-CM

## 2020-08-06 ENCOUNTER — Ambulatory Visit
Admission: RE | Admit: 2020-08-06 | Discharge: 2020-08-06 | Disposition: A | Discharge status: Home / Self Care | Payer: Medicare HMO | Source: Ambulatory Visit | Attending: Internal Medicine | Admitting: Internal Medicine

## 2020-08-06 ENCOUNTER — Other Ambulatory Visit: Payer: Self-pay

## 2020-08-06 DIAGNOSIS — Z1231 Encounter for screening mammogram for malignant neoplasm of breast: Secondary | ICD-10-CM | POA: Diagnosis present

## 2020-08-06 IMAGING — MG DIGITAL SCREENING BILAT W/ TOMO W/ CAD
6 of 10 series · 6 of 30 positions shown · non-contrast
Comparison: Previous exam(s).

ACR Breast Density Category a: The breast tissue is almost entirely
fatty.

CLINICAL DATA: Screening.

EXAM:
DIGITAL SCREENING BILATERAL MAMMOGRAM WITH TOMO AND CAD

[L CC synth-2D]
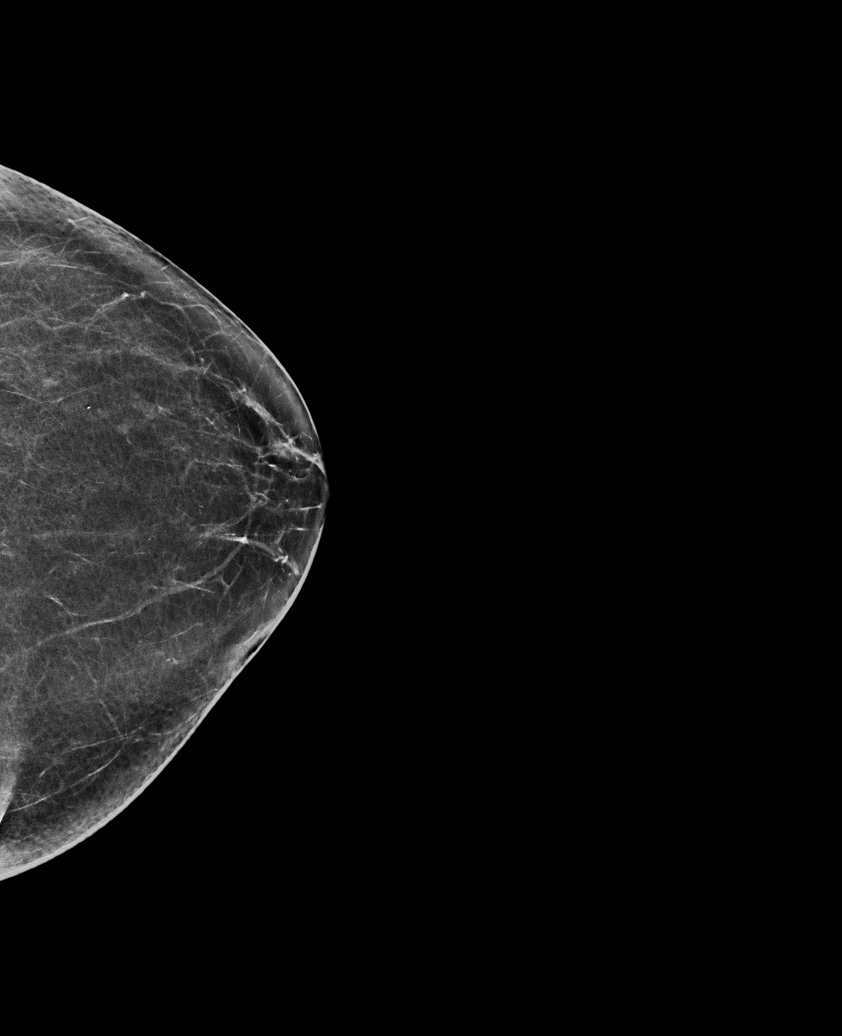

[R CC synth-2D]
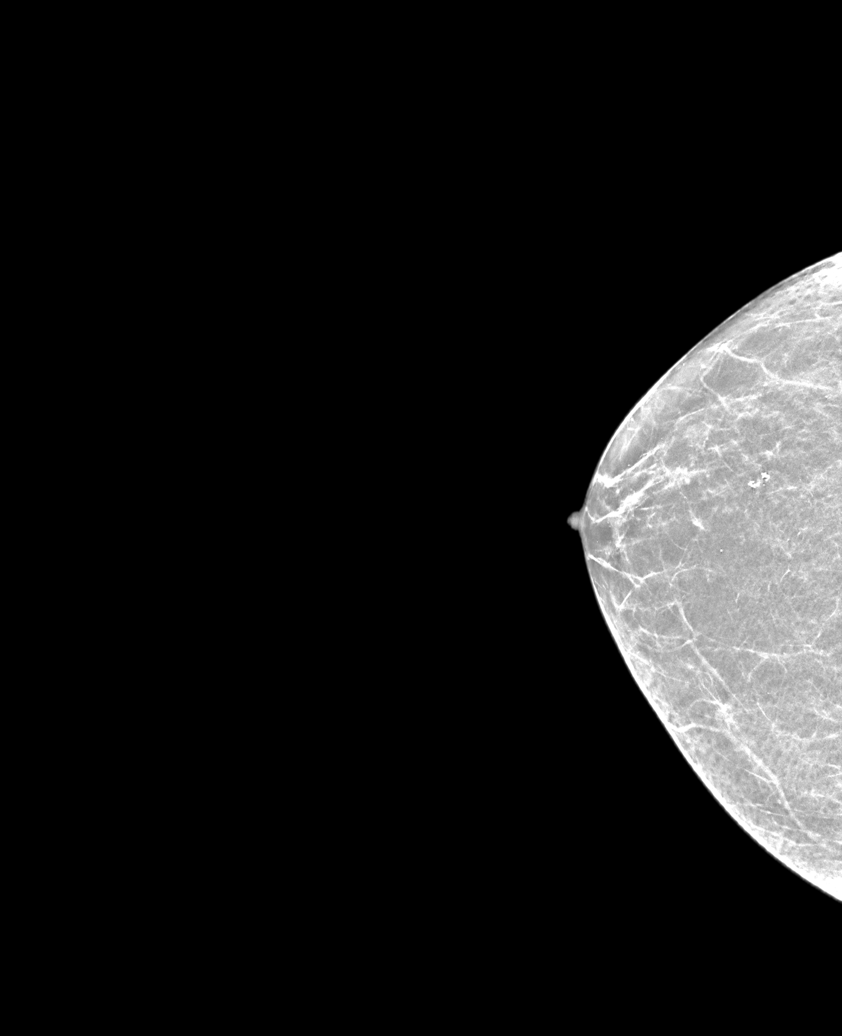

[L MLO synth-2D (1 of 2)]
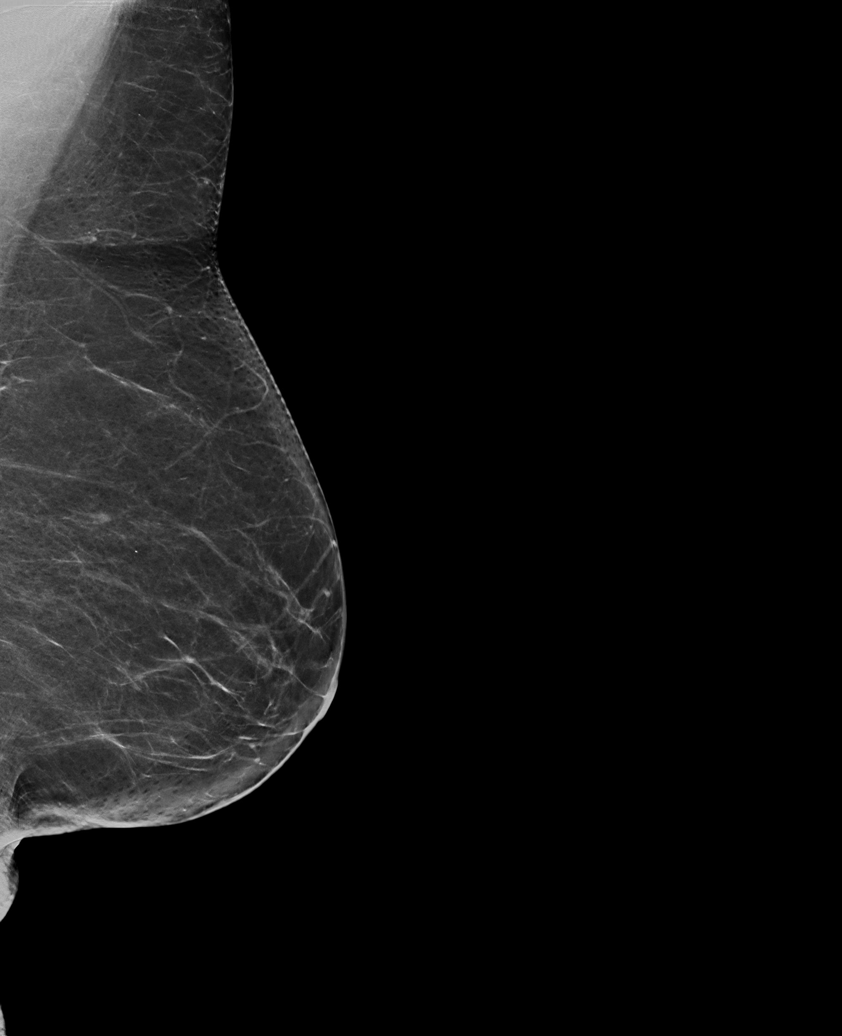

[L MLO synth-2D (2 of 2)]
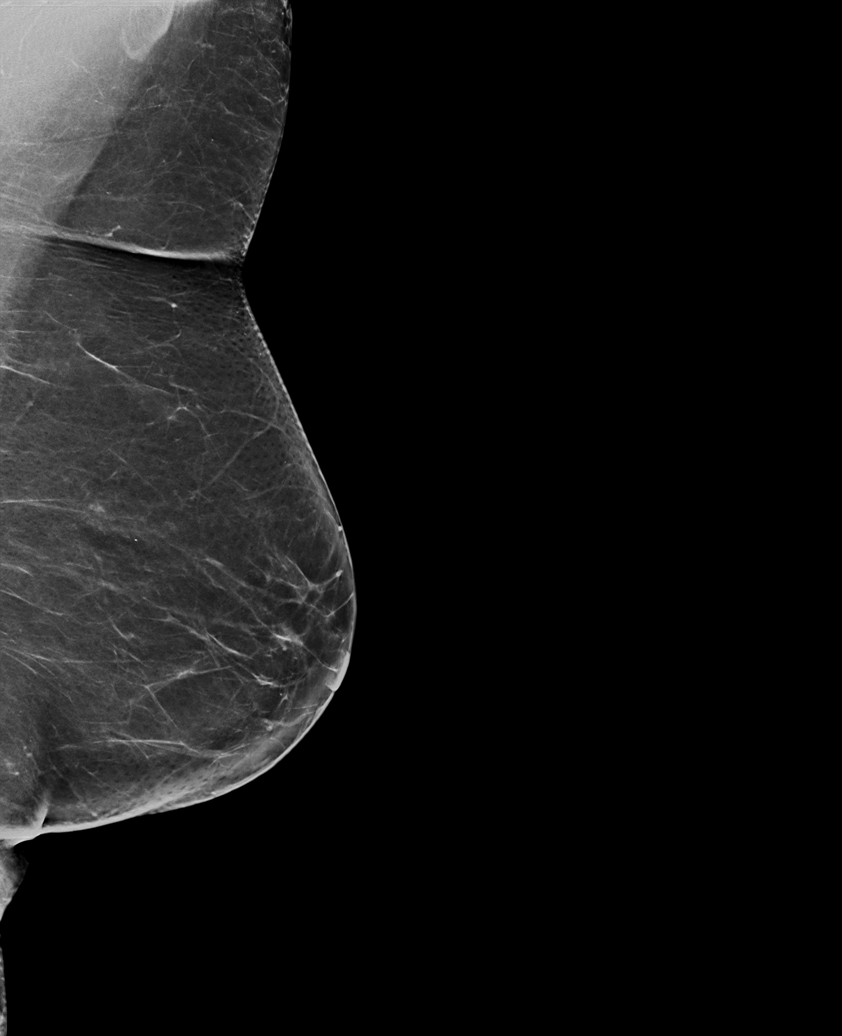

[R MLO synth-2D]
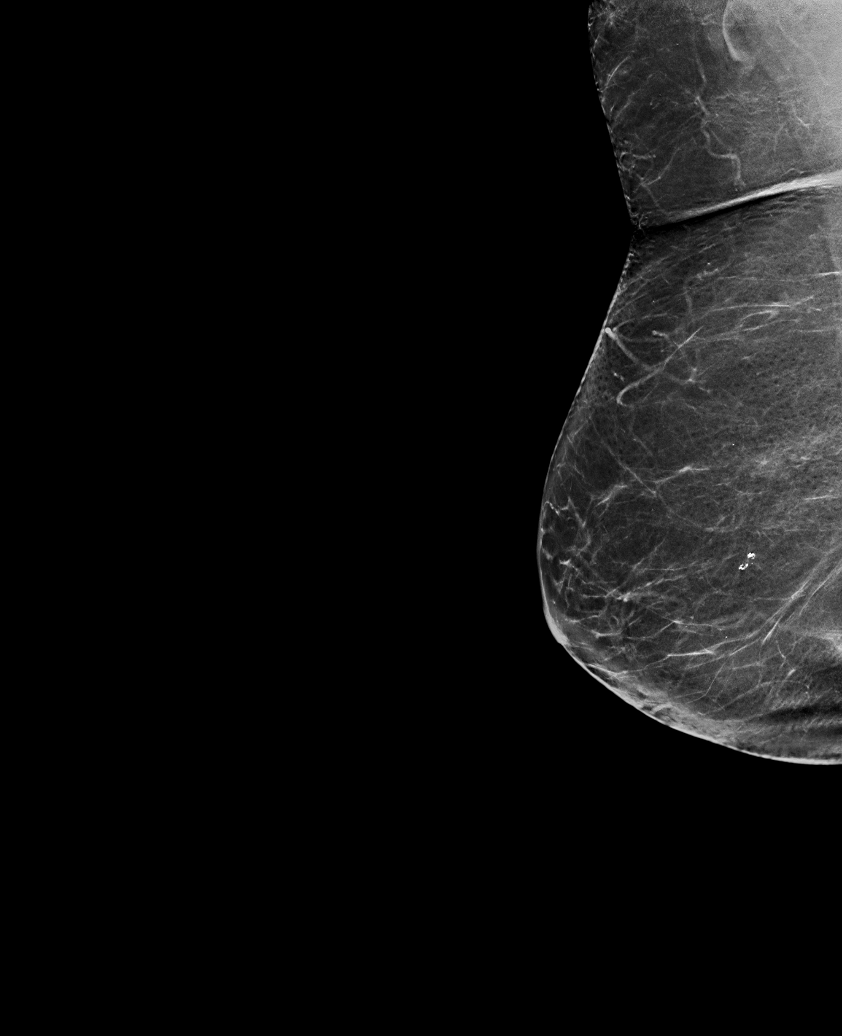

[L CC tomo · tomo slice 33/65.0]
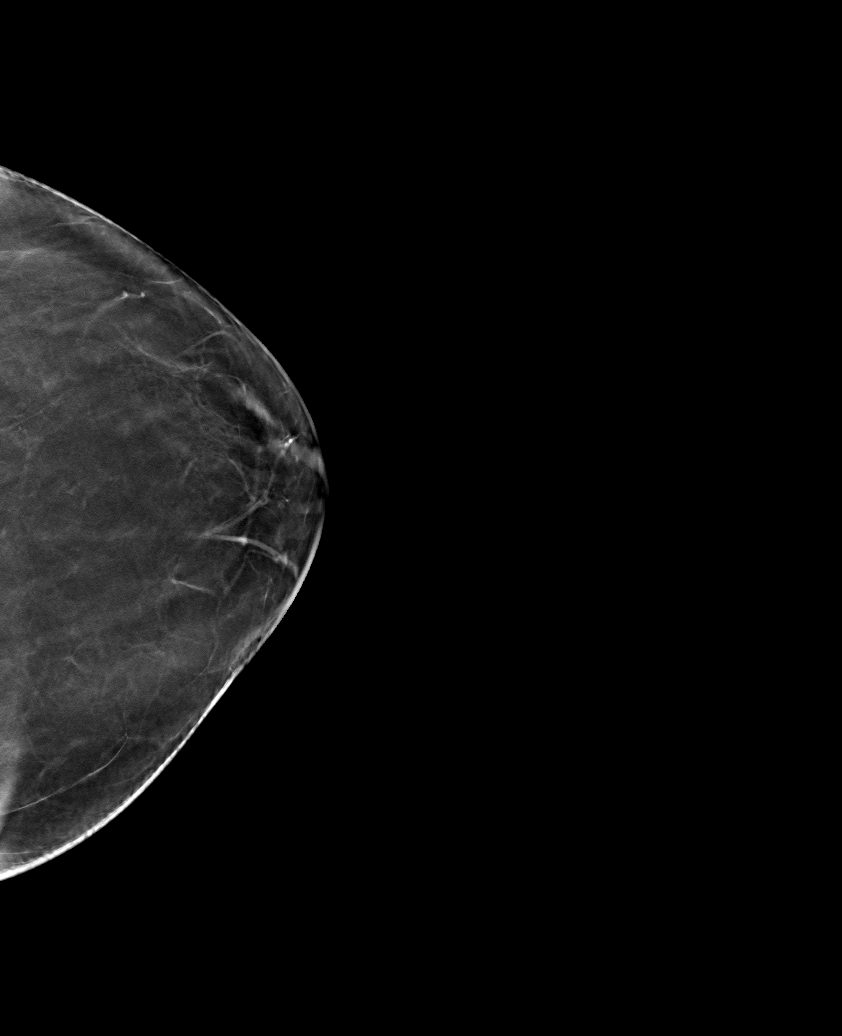

[6 of 30 positions shown; findings below may reference images not displayed]

FINDINGS: There are no findings suspicious for malignancy. Images were
processed with CAD.
IMPRESSION: No mammographic evidence of malignancy. A result letter of this
screening mammogram will be mailed directly to the patient.

RECOMMENDATION:
Screening mammogram in one year. (Code:[id number]-[id number])

BI-RADS CATEGORY  1: Negative.

## 2020-09-04 DIAGNOSIS — M79662 Pain in left lower leg: Secondary | ICD-10-CM | POA: Diagnosis not present

## 2020-09-04 DIAGNOSIS — M810 Age-related osteoporosis without current pathological fracture: Secondary | ICD-10-CM | POA: Diagnosis not present

## 2020-09-04 DIAGNOSIS — R0989 Other specified symptoms and signs involving the circulatory and respiratory systems: Secondary | ICD-10-CM | POA: Diagnosis not present

## 2020-09-04 LAB — HM DEXA SCAN

## 2020-09-20 DIAGNOSIS — M47816 Spondylosis without myelopathy or radiculopathy, lumbar region: Secondary | ICD-10-CM | POA: Diagnosis not present

## 2020-11-19 DIAGNOSIS — M545 Low back pain, unspecified: Secondary | ICD-10-CM | POA: Diagnosis not present

## 2020-11-19 DIAGNOSIS — M5136 Other intervertebral disc degeneration, lumbar region: Secondary | ICD-10-CM | POA: Diagnosis not present

## 2020-11-19 DIAGNOSIS — M47816 Spondylosis without myelopathy or radiculopathy, lumbar region: Secondary | ICD-10-CM | POA: Diagnosis not present

## 2020-11-20 DIAGNOSIS — E559 Vitamin D deficiency, unspecified: Secondary | ICD-10-CM | POA: Diagnosis not present

## 2020-11-20 DIAGNOSIS — L821 Other seborrheic keratosis: Secondary | ICD-10-CM | POA: Diagnosis not present

## 2020-11-20 DIAGNOSIS — M810 Age-related osteoporosis without current pathological fracture: Secondary | ICD-10-CM | POA: Diagnosis not present

## 2020-11-20 DIAGNOSIS — L578 Other skin changes due to chronic exposure to nonionizing radiation: Secondary | ICD-10-CM | POA: Diagnosis not present

## 2020-11-20 DIAGNOSIS — Z86018 Personal history of other benign neoplasm: Secondary | ICD-10-CM | POA: Diagnosis not present

## 2020-11-20 DIAGNOSIS — Z872 Personal history of diseases of the skin and subcutaneous tissue: Secondary | ICD-10-CM | POA: Diagnosis not present

## 2020-11-21 DIAGNOSIS — M47816 Spondylosis without myelopathy or radiculopathy, lumbar region: Secondary | ICD-10-CM | POA: Diagnosis not present

## 2020-11-26 DIAGNOSIS — H26493 Other secondary cataract, bilateral: Secondary | ICD-10-CM | POA: Diagnosis not present

## 2020-12-06 ENCOUNTER — Other Ambulatory Visit: Payer: Self-pay | Admitting: Family Medicine

## 2020-12-06 DIAGNOSIS — M47816 Spondylosis without myelopathy or radiculopathy, lumbar region: Secondary | ICD-10-CM | POA: Diagnosis not present

## 2020-12-06 DIAGNOSIS — M4807 Spinal stenosis, lumbosacral region: Secondary | ICD-10-CM | POA: Diagnosis not present

## 2020-12-06 DIAGNOSIS — M5416 Radiculopathy, lumbar region: Secondary | ICD-10-CM

## 2020-12-06 DIAGNOSIS — M5136 Other intervertebral disc degeneration, lumbar region: Secondary | ICD-10-CM | POA: Diagnosis not present

## 2020-12-17 ENCOUNTER — Other Ambulatory Visit: Payer: Self-pay

## 2020-12-17 ENCOUNTER — Ambulatory Visit
Admission: RE | Admit: 2020-12-17 | Discharge: 2020-12-17 | Disposition: A | Discharge status: Home / Self Care | Payer: Medicare HMO | Source: Ambulatory Visit | Attending: Family Medicine | Admitting: Family Medicine

## 2020-12-17 DIAGNOSIS — M5416 Radiculopathy, lumbar region: Secondary | ICD-10-CM

## 2020-12-17 DIAGNOSIS — M4316 Spondylolisthesis, lumbar region: Secondary | ICD-10-CM | POA: Diagnosis not present

## 2020-12-17 DIAGNOSIS — M5117 Intervertebral disc disorders with radiculopathy, lumbosacral region: Secondary | ICD-10-CM | POA: Diagnosis not present

## 2020-12-17 DIAGNOSIS — M4807 Spinal stenosis, lumbosacral region: Secondary | ICD-10-CM | POA: Diagnosis not present

## 2020-12-17 DIAGNOSIS — M2578 Osteophyte, vertebrae: Secondary | ICD-10-CM | POA: Diagnosis not present

## 2020-12-17 IMAGING — MR MR LUMBAR SPINE W/O CM
6 of 7 series · 35 of 48 positions shown · non-contrast
Comparison: MRI February 11, 2016.

CLINICAL DATA: Lumbar radiculitis.

EXAM:
MRI LUMBAR SPINE WITHOUT CONTRAST
TECHNIQUE: Multiplanar, multisequence MR imaging of the lumbar spine was
performed. No intravenous contrast was administered.

[Series 5: T2 · sagittal · 4.0mm · 0.81mm/px · 6 of 21 slices shown (1 of 3)]
[im 1/21]
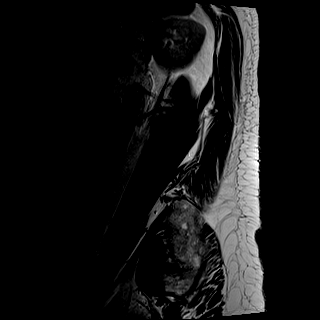
[im 5/21]
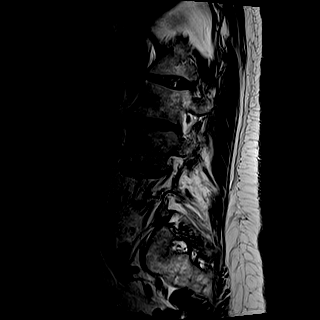
[im 9/21]
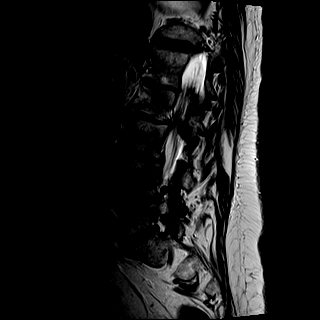
[im 13/21]
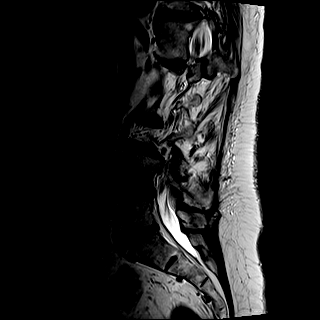
[im 17/21]
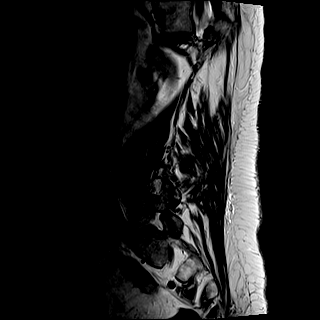
[im 21/21]
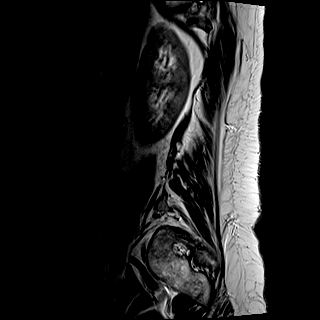

[Series 6: T1 · sagittal · 4.0mm · 0.81mm/px · 7 of 21 slices shown (1 of 3)]
[im 1/21]
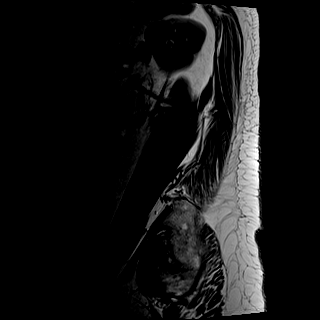
[im 4/21]
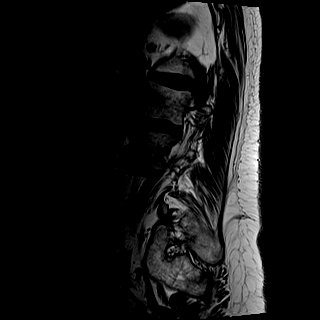
[im 7/21]
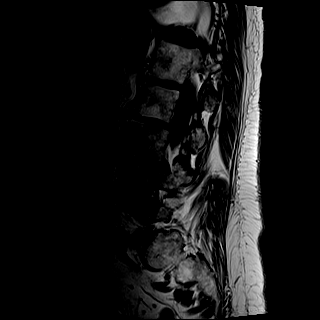
[im 11/21]
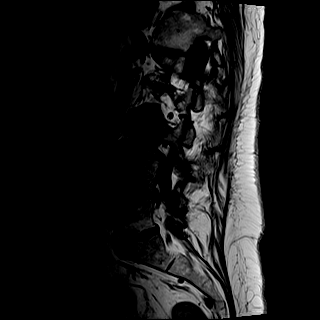
[im 14/21]
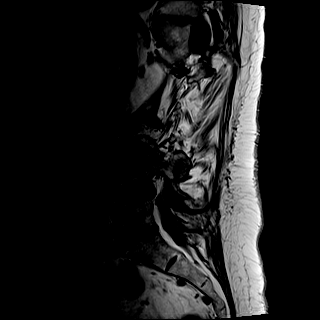
[im 17/21]
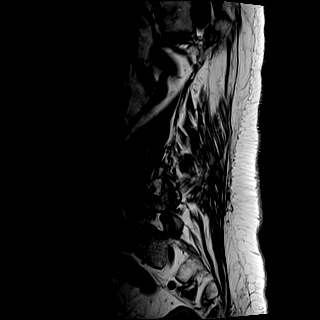
[im 21/21]
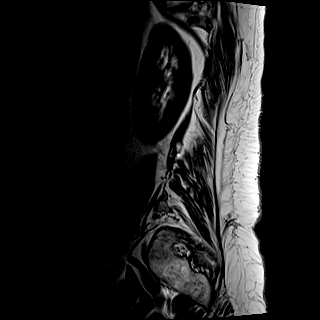

[Series 8: T2 · axial · 4.0mm · 0.78mm/px · z∈[+83,+167]mm · 6 of 19 slices shown (2 of 3)]
[im 1/19]
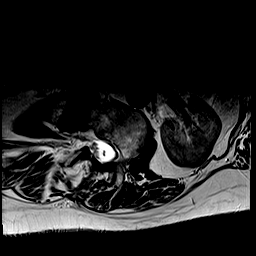
[im 4/19]
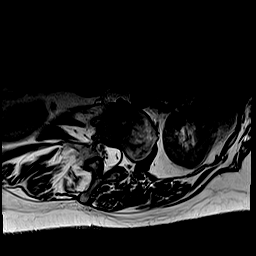
[im 8/19]
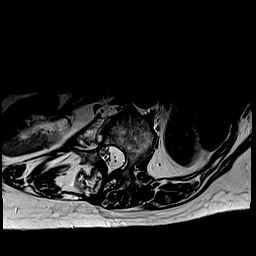
[im 11/19]
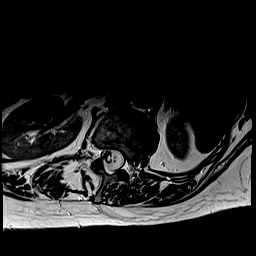
[im 15/19]
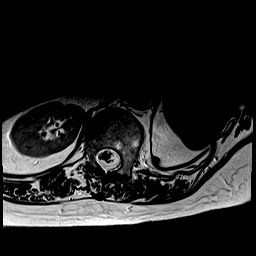
[im 19/19]
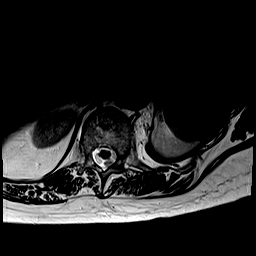

[Series 9: T2 · axial · 4.0mm · 0.78mm/px · z∈[+0,+105]mm · 8 of 23 slices shown (3 of 3)]
[im 1/23]
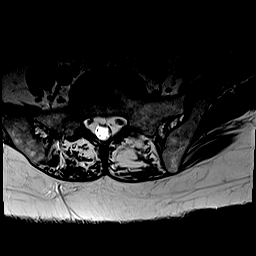
[im 4/23]
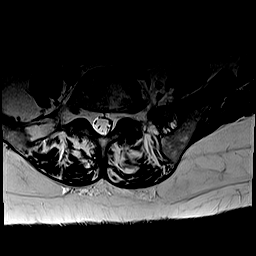
[im 7/23]
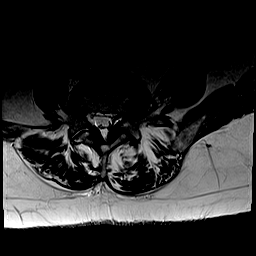
[im 10/23]
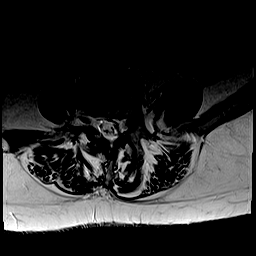
[im 13/23]
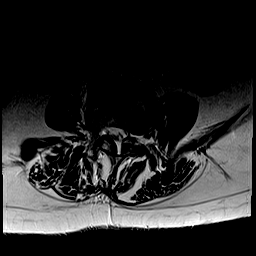
[im 16/23]
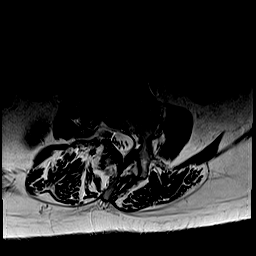
[im 19/23]
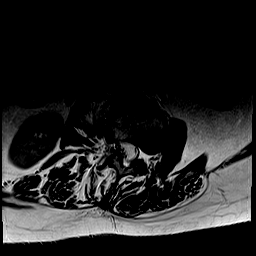
[im 23/23]
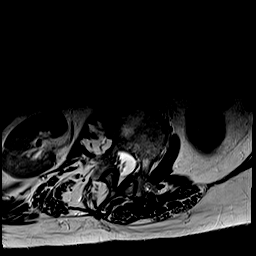

[Series 11: T1 · axial · 4.0mm · 0.39mm/px · z∈[+83,+167]mm · 6 of 19 slices shown (2 of 3)]
[im 1/19]
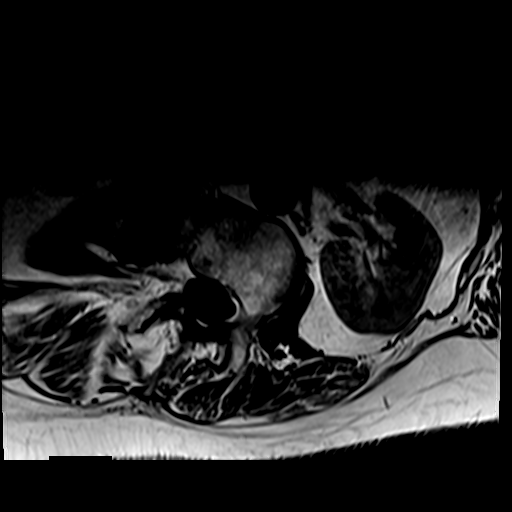
[im 4/19]
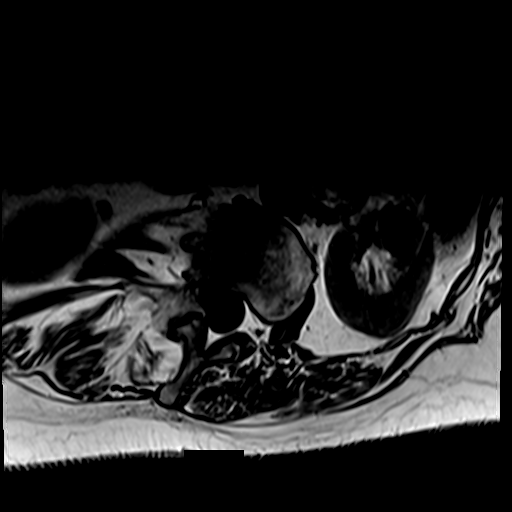
[im 8/19]
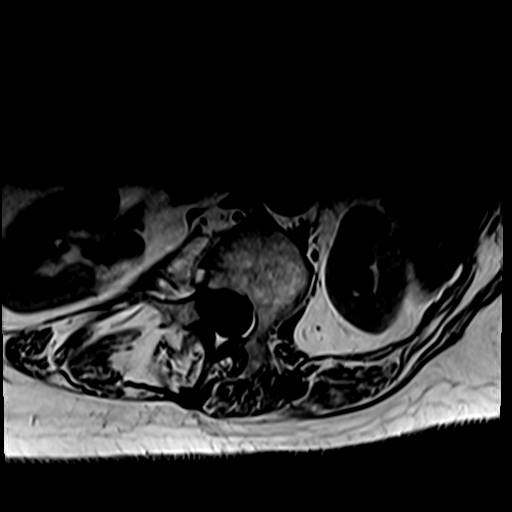
[im 11/19]
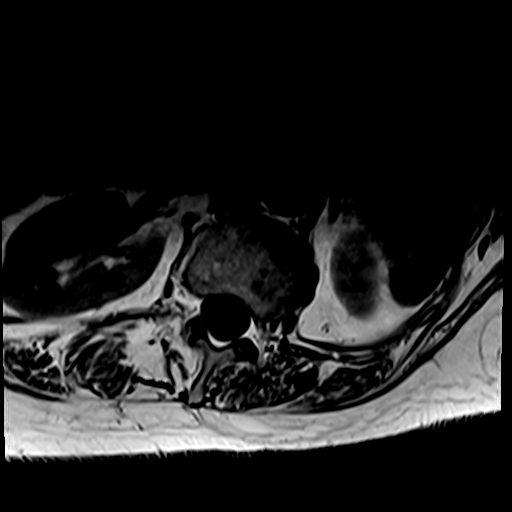
[im 15/19]
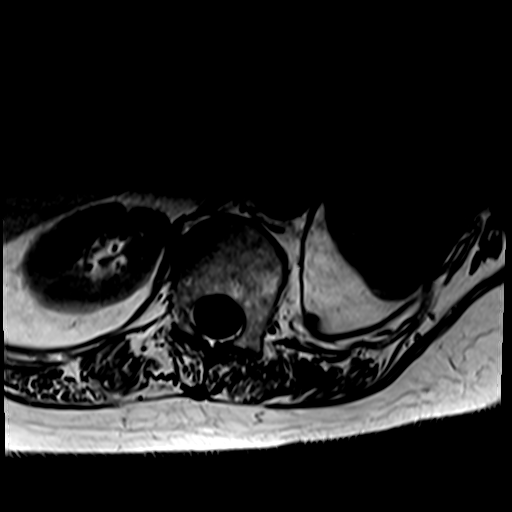
[im 19/19]
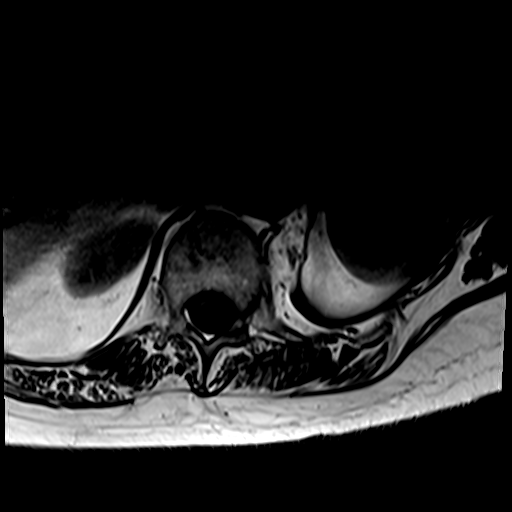

[Series 12: T1 · axial · 4.0mm · 0.39mm/px · z∈[+0,+15]mm · 2 of 23 slices shown (3 of 3)]
[im 1/23]
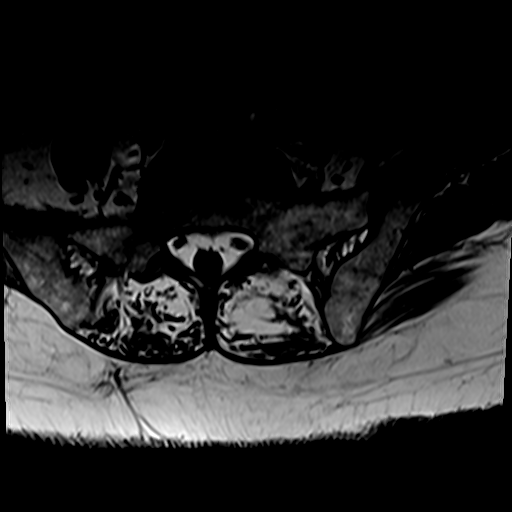
[im 4/23]
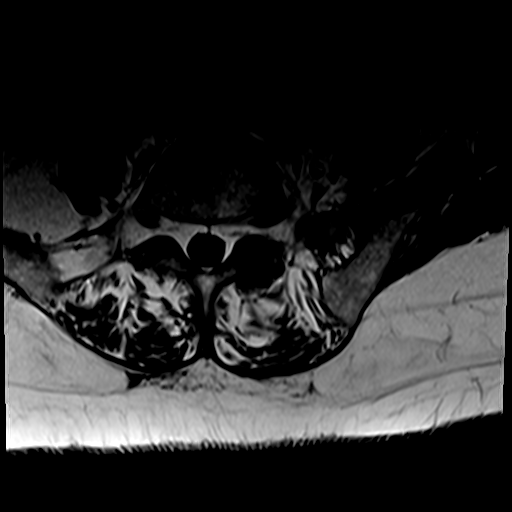

[35 of 48 positions shown; findings below may reference images not displayed]

FINDINGS: Segmentation: The purposes of this dictation, the inferior-most
fully formed intervertebral disc is labeled L5-S1.

Alignment: Marked rotatory levocurvature centered at L2-L3. Similar
slight, grade 1 anterolisthesis of L3 on L4.

Vertebrae: Degenerative/discogenic endplate signal changes at L3-L4.

Conus medullaris and cauda equina: Conus extends to the T12-L1
level. Conus appears normal.

Paraspinal and other soft tissues: Unremarkable.

Disc levels:

T12-L1: No evidence of significant canal or foraminal stenosis.

L1-L2: Left eccentric disc desiccation and height loss. Mild disc
bulging without evidence of significant canal or foraminal stenosis.

L2-L3: Left eccentric disc desiccation and height loss. Mild
bilateral facet hypertrophy, mild disc bulging, and right far
lateral disc/osteophyte. No significant canal or foraminal stenosis.

L3-L4: Disc desiccation and height loss. Slight (grade 1)
anterolisthesis of L3 on L4, similar. Uncovering of the disc with
slight disc bulge. Mild bilateral facet hypertrophy and ligamentum
flavum thickening. No significant canal or foraminal stenosis.

L4-L5: Left eccentric disc desiccation and height loss. Broad disc
bulge with superimposed left subarticular/foraminal disc protrusion.
Moderate left greater than right facet hypertrophy. Resulting
moderate left foraminal stenosis with far lateral disc contacting
the exiting/exited left L4 nerve. Mild left subarticular recess
narrowing without significant canal or right foraminal stenosis.

L5-S1: Left eccentric disc desiccation and height loss. Left
eccentric disc bulge and mild to moderate left greater than right
facet hypertrophy. Resulting moderate left foraminal stenosis with
far lateral disc contacting the exiting/exited left L5 nerve.
Findings are progressed from prior.
IMPRESSION: 1. For the purposes of this dictation, the inferior-most fully
formed intervertebral disc is labeled L5-S1.
2. Moderate left foraminal stenosis at L4-L5 and L5-S1 with far
lateral disc contacting the exiting/exited L4 and L5 nerve roots.
Findings are progressed from prior.
3. No significant canal stenosis. Mild left subarticular recess
stenosis at L4-L5.
4. Similar marked rotatory levocurvature.

## 2020-12-20 DIAGNOSIS — M5416 Radiculopathy, lumbar region: Secondary | ICD-10-CM | POA: Diagnosis not present

## 2020-12-20 DIAGNOSIS — M5136 Other intervertebral disc degeneration, lumbar region: Secondary | ICD-10-CM | POA: Diagnosis not present

## 2020-12-20 DIAGNOSIS — M48061 Spinal stenosis, lumbar region without neurogenic claudication: Secondary | ICD-10-CM | POA: Diagnosis not present

## 2021-02-13 DIAGNOSIS — M810 Age-related osteoporosis without current pathological fracture: Secondary | ICD-10-CM | POA: Diagnosis not present

## 2021-03-10 DIAGNOSIS — M5136 Other intervertebral disc degeneration, lumbar region: Secondary | ICD-10-CM | POA: Diagnosis not present

## 2021-03-10 DIAGNOSIS — M5416 Radiculopathy, lumbar region: Secondary | ICD-10-CM | POA: Diagnosis not present

## 2021-03-10 DIAGNOSIS — M47816 Spondylosis without myelopathy or radiculopathy, lumbar region: Secondary | ICD-10-CM | POA: Diagnosis not present

## 2021-04-08 DIAGNOSIS — M47816 Spondylosis without myelopathy or radiculopathy, lumbar region: Secondary | ICD-10-CM | POA: Diagnosis not present

## 2021-04-29 DIAGNOSIS — M47816 Spondylosis without myelopathy or radiculopathy, lumbar region: Secondary | ICD-10-CM | POA: Diagnosis not present

## 2021-04-29 DIAGNOSIS — M5136 Other intervertebral disc degeneration, lumbar region: Secondary | ICD-10-CM | POA: Diagnosis not present

## 2021-04-29 DIAGNOSIS — M5416 Radiculopathy, lumbar region: Secondary | ICD-10-CM | POA: Diagnosis not present

## 2021-05-19 ENCOUNTER — Other Ambulatory Visit: Payer: Self-pay | Admitting: Internal Medicine

## 2021-05-19 ENCOUNTER — Encounter: Payer: Self-pay | Admitting: Internal Medicine

## 2021-05-19 DIAGNOSIS — M810 Age-related osteoporosis without current pathological fracture: Secondary | ICD-10-CM | POA: Insufficient documentation

## 2021-05-19 DIAGNOSIS — M47816 Spondylosis without myelopathy or radiculopathy, lumbar region: Secondary | ICD-10-CM | POA: Insufficient documentation

## 2021-05-19 DIAGNOSIS — M5136 Other intervertebral disc degeneration, lumbar region: Secondary | ICD-10-CM | POA: Insufficient documentation

## 2021-05-19 DIAGNOSIS — M51369 Other intervertebral disc degeneration, lumbar region without mention of lumbar back pain or lower extremity pain: Secondary | ICD-10-CM | POA: Insufficient documentation

## 2021-05-20 ENCOUNTER — Encounter: Payer: Self-pay | Admitting: Internal Medicine

## 2021-05-20 ENCOUNTER — Other Ambulatory Visit: Payer: Self-pay

## 2021-05-20 ENCOUNTER — Ambulatory Visit (INDEPENDENT_AMBULATORY_CARE_PROVIDER_SITE_OTHER): Payer: Medicare HMO | Admitting: Internal Medicine

## 2021-05-20 VITALS — BP 122/84 | HR 72 | Temp 98.1°F | Ht 69.0 in | Wt 192.0 lb

## 2021-05-20 DIAGNOSIS — E782 Mixed hyperlipidemia: Secondary | ICD-10-CM

## 2021-05-20 DIAGNOSIS — M810 Age-related osteoporosis without current pathological fracture: Secondary | ICD-10-CM | POA: Diagnosis not present

## 2021-05-20 DIAGNOSIS — Z1231 Encounter for screening mammogram for malignant neoplasm of breast: Secondary | ICD-10-CM

## 2021-05-20 NOTE — Progress Notes (Signed)
Date:  05/20/2021   Name:  Jennifer Morrow   DOB:  11/30/1950   MRN:  [id number]   Chief Complaint: Establish Care  HPI  No results found for: CREATININE, BUN, NA, K, CL, CO2 No results found for: CHOL, HDL, LDLCALC, LDLDIRECT, TRIG, CHOLHDL No results found for: TSH No results found for: HGBA1C No results found for: WBC, HGB, HCT, MCV, PLT No results found for: ALT, AST, GGT, ALKPHOS, BILITOT   Review of Systems  Constitutional:  Negative for chills, fatigue and fever.  HENT:  Negative for congestion, hearing loss and trouble swallowing.   Eyes:  Negative for visual disturbance.  Respiratory:  Negative for cough, chest tightness, shortness of breath and wheezing.   Cardiovascular:  Negative for chest pain, palpitations and leg swelling.  Gastrointestinal:  Negative for abdominal pain, constipation and diarrhea.  Endocrine: Negative for polyuria.  Genitourinary:  Negative for dysuria and frequency.  Musculoskeletal:  Positive for back pain. Negative for arthralgias, gait problem and joint swelling.  Skin:  Negative for color change and rash.  Neurological:  Negative for dizziness, tremors, light-headedness and headaches.  Hematological:  Negative for adenopathy. Does not bruise/bleed easily.  Psychiatric/Behavioral:  Negative for dysphoric mood and sleep disturbance. The patient is not nervous/anxious.    Patient Active Problem List   Diagnosis Date Noted   Age-related osteoporosis without current pathological fracture 05/19/2021   Spondylosis of lumbar region without myelopathy or radiculopathy 05/19/2021   Degenerative disc disease, lumbar 05/19/2021   Scoliosis, unspecified 01/09/2015   Vitamin D deficiency 01/08/2014   Mixed hyperlipidemia 01/08/2014    Allergies  Allergen Reactions   Celecoxib Other (See Comments)    Elevated LFT's   Oxaprozin Other (See Comments)    Elevated LFT's   Terbinafine Other (See Comments)    Elevated LFT's    Past Surgical History:   Procedure Laterality Date   CATARACT EXTRACTION W/PHACO Left 01/06/2018   Procedure: CATARACT EXTRACTION PHACO AND INTRAOCULAR LENS PLACEMENT (IOC);  Surgeon: Nevada Crane, MD;  Location: [facility];  Service: Ophthalmology;  Laterality: Left;  [place] 00:27.5 AP% 4.3 CDE 1.18 FLUID PACK LOT # [id number]   CATARACT EXTRACTION W/PHACO Right 02/10/2018   Procedure: CATARACT EXTRACTION PHACO AND INTRAOCULAR LENS PLACEMENT (IOC);  Surgeon: Nevada Crane, MD;  Location: [facility] OR[facility];  Service: Ophthalmology;  Laterality: Right;  [place] 00:30.5 AP% 5.0 CDE 1.51  Fluid pack lot # [id number] H   FOOT X 2     HEEL SPUR EXCISION     TONSILLECTOMY      Social History   Tobacco Use   Smoking status: Never   Smokeless tobacco: Never  Vaping Use   Vaping Use: Never used  Substance Use Topics   Alcohol use: Yes    Comment: OCCAS   Drug use: Never     Medication list has been reviewed and updated.  Current Meds  Medication Sig   Calcium-Vitamin D-Vitamin K 650-12.5-40 MG-MCG-MCG CHEW    cetirizine (ZYRTEC) 10 MG tablet Take 10 mg by mouth daily as needed for allergies.   fluticasone (FLONASE) 50 MCG/ACT nasal spray Place into the nose.   gabapentin (NEURONTIN) 300 MG capsule Take 1 capsule by mouth 2 (two) times daily.   HYDROcodone-acetaminophen (NORCO/VICODIN) 5-325 MG tablet Take by mouth as needed.   meloxicam (MOBIC) 15 MG tablet Take 15 mg by mouth as needed for pain.   tiZANidine (ZANAFLEX) 2 MG tablet Take 2 mg by mouth daily as needed for muscle  pain.   Triamcinolone Acetonide (NASACORT ALLERGY 24HR NA) Place 2 sprays into the nose daily as needed (allergies).   Vitamin D, Ergocalciferol, (DRISDOL) 1.25 MG (50000 UNIT) CAPS capsule Take 50,000 Units by mouth every 7 (seven) days.   Zoledronic Acid (RECLAST IV) Inject into the vein.    PHQ 2/9 Scores 05/20/2021  PHQ - 2 Score 0  PHQ- 9 Score 1    GAD 7 : Generalized Anxiety Score 05/20/2021  Nervous, Anxious, on Edge 0  Control/stop  worrying 0  Worry too much - different things 0  Trouble relaxing 0  Restless 0  Easily annoyed or irritable 0  Afraid - awful might happen 0  Total GAD 7 Score 0    BP Readings from Last 3 Encounters:  05/20/21 122/84  02/10/18 (!) 107/54  01/06/18 122/63    Physical Exam Vitals and nursing note reviewed.  Constitutional:      General: She is not in acute distress.    Appearance: Normal appearance. She is well-developed.  HENT:     Head: Normocephalic and atraumatic.  Neck:     Vascular: No carotid bruit.  Cardiovascular:     Rate and Rhythm: Normal rate and regular rhythm.     Heart sounds: No murmur heard. Pulmonary:     Effort: Pulmonary effort is normal. No respiratory distress.     Breath sounds: No wheezing or rhonchi.  Musculoskeletal:     Cervical back: Normal range of motion.     Right lower leg: No edema.     Left lower leg: No edema.  Lymphadenopathy:     Cervical: No cervical adenopathy.  Skin:    General: Skin is warm and dry.     Capillary Refill: Capillary refill takes less than 2 seconds.     Findings: No rash.  Neurological:     Mental Status: She is alert and oriented to person, place, and time.  Psychiatric:        Mood and Affect: Mood normal.        Behavior: Behavior normal.    Wt Readings from Last 3 Encounters:  05/20/21 192 lb (87.1 kg)  02/08/18 186 lb (84.4 kg)  01/06/18 189 lb (85.7 kg)    BP 122/84   Pulse 72   Temp 98.1 F (36.7 C) (Oral)   Ht 5\' 9"  (1.753 m)   Wt 192 lb (87.1 kg)   SpO2 98%   BMI 28.35 kg/m   Assessment and Plan: 1. Age-related osteoporosis without current pathological fracture Being followed by Endo and treated with Reclast infusions - one dose so far in June this year. Continues on calcium and vitamin D No fracture hx  2. Mixed hyperlipidemia Noted in the past to have mild increase in TC. Continue low fat diet  3. Encounter for screening mammogram for breast cancer Schedule at [facility] - MM 3D  SCREEN BREAST BILATERAL   Partially dictated using [facility]. Any errors are unintentional.  Animal nutritionist, MD [facility]  05/21/2021

## 2021-05-21 ENCOUNTER — Ambulatory Visit: Payer: Self-pay | Admitting: Internal Medicine

## 2021-05-21 ENCOUNTER — Other Ambulatory Visit: Payer: Self-pay | Admitting: Internal Medicine

## 2021-05-21 DIAGNOSIS — Z1231 Encounter for screening mammogram for malignant neoplasm of breast: Secondary | ICD-10-CM

## 2021-05-23 DIAGNOSIS — E559 Vitamin D deficiency, unspecified: Secondary | ICD-10-CM | POA: Diagnosis not present

## 2021-05-23 DIAGNOSIS — M810 Age-related osteoporosis without current pathological fracture: Secondary | ICD-10-CM | POA: Diagnosis not present

## 2021-05-23 LAB — VITAMIN D 25 HYDROXY (VIT D DEFICIENCY, FRACTURES): Vit D, 25-Hydroxy: 42.6

## 2021-05-24 ENCOUNTER — Other Ambulatory Visit: Payer: Self-pay

## 2021-05-24 ENCOUNTER — Ambulatory Visit
Admission: EM | Admit: 2021-05-24 | Discharge: 2021-05-24 | Disposition: A | Discharge status: Home / Self Care | Payer: Medicare HMO | Attending: Family Medicine | Admitting: Family Medicine

## 2021-05-24 ENCOUNTER — Ambulatory Visit (INDEPENDENT_AMBULATORY_CARE_PROVIDER_SITE_OTHER): Payer: Medicare HMO

## 2021-05-24 DIAGNOSIS — W19XXXA Unspecified fall, initial encounter: Secondary | ICD-10-CM | POA: Diagnosis not present

## 2021-05-24 DIAGNOSIS — S6992XA Unspecified injury of left wrist, hand and finger(s), initial encounter: Secondary | ICD-10-CM

## 2021-05-24 DIAGNOSIS — M79645 Pain in left finger(s): Secondary | ICD-10-CM

## 2021-05-24 IMAGING — CR DG FINGER RING 2+V*L*
3 series · 3 of 3 positions shown · non-contrast
Comparison: None.

CLINICAL DATA: Status post fall with pain in the left ring finger.

EXAM:
LEFT RING FINGER 2+V

[finger ap]
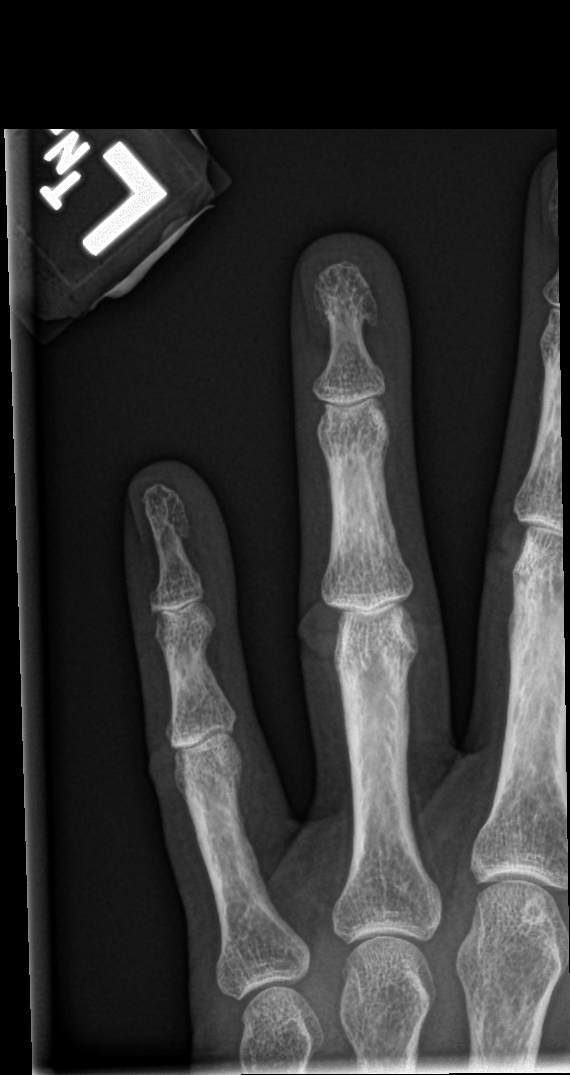

[finger lat]
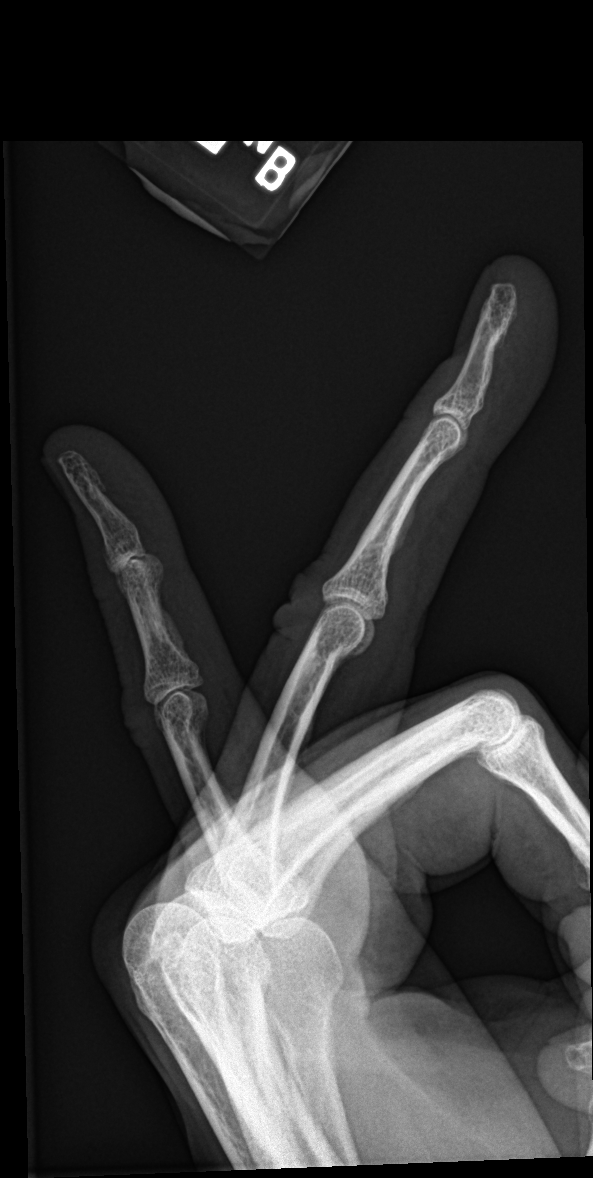

[finger obl]
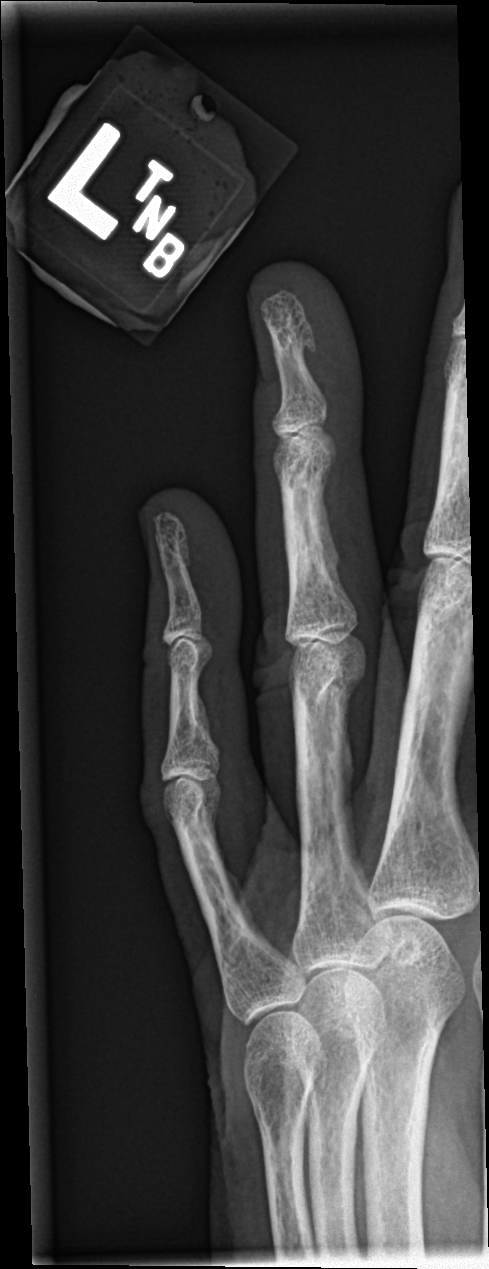

[3 of 3 positions shown; findings below may reference images not displayed]

FINDINGS: There is no evidence of fracture or dislocation. Soft tissues are
unremarkable.
IMPRESSION: No acute fracture or dislocation

## 2021-05-24 NOTE — ED Triage Notes (Signed)
Pt c/o fall yesterday, reports injury to her left ring finger. Pt does have slight bruising and pain to the finger. Pt would like xray to check for break. Pt denies head injury, LOC or other injuries.

## 2021-05-24 NOTE — Discharge Instructions (Signed)
Rest. Ice, elevation.  OTC Tylenol and/or Ibuprofen as needed for pain.  Take care  Dr. Adriana Simas

## 2021-05-24 NOTE — ED Provider Notes (Signed)
[facility]    CSN: [id number] Arrival date & time: 05/24/21  0820      History   Chief Complaint Chief Complaint  Patient presents with   Finger Injury    L ring    HPI 70 year old female presents with the above complaint.  Patient states that she fell yesterday.  She injured her left ring finger.  She reports pain and some stiffness.  She is concerned that she may have a fracture.  Would like an x-ray today.  Denies any other injury.  No other complaints or concerns at this time.  Her pain is currently 3/10 in severity.  Past Medical History:  Diagnosis Date   Arthritis    Bronchitis    CHRONIC   Bulging lumbar disc    degenerative dics   Scoliosis    LIMITS LUNG FUNCTION   Spinal stenosis     Patient Active Problem List   Diagnosis Date Noted   Age-related osteoporosis without current pathological fracture 05/19/2021   Spondylosis of lumbar region without myelopathy or radiculopathy 05/19/2021   Degenerative disc disease, lumbar 05/19/2021   Scoliosis, unspecified 01/09/2015   Vitamin D deficiency 01/08/2014   Mixed hyperlipidemia 01/08/2014    Past Surgical History:  Procedure Laterality Date   CATARACT EXTRACTION W/PHACO Left 01/06/2018   Procedure: CATARACT EXTRACTION PHACO AND INTRAOCULAR LENS PLACEMENT (IOC);  Surgeon: Nevada Crane, MD;  Location: [facility] ORS;  Service: Ophthalmology;  Laterality: Left;  [place] 00:27.5 AP% 4.3 CDE 1.18 FLUID PACK LOT # [id number]   CATARACT EXTRACTION W/PHACO Right 02/10/2018   Procedure: CATARACT EXTRACTION PHACO AND INTRAOCULAR LENS PLACEMENT (IOC);  Surgeon: Nevada Crane, MD;  Location: [facility] OR[facility];  Service: Ophthalmology;  Laterality: Right;  [place] 00:30.5 AP% 5.0 CDE 1.51  Fluid pack lot # [id number] H   FOOT X 2     HEEL SPUR EXCISION     TONSILLECTOMY      OB History   No obstetric history on file.      Home Medications    Prior to Admission medications   Medication Sig Start Date End Date  Taking? Authorizing Provider  Calcium-Vitamin D-Vitamin K 650-12.5-40 MG-MCG-MCG CHEW    Yes [provider]  cetirizine (ZYRTEC) 10 MG tablet Take 10 mg by mouth daily as needed for allergies.   Yes [provider]  fluticasone (FLONASE) 50 MCG/ACT nasal spray Place into the nose.   Yes [provider]  gabapentin (NEURONTIN) 300 MG capsule Take 1 capsule by mouth 2 (two) times daily. 12/06/20  Yes [provider]  HYDROcodone-acetaminophen (NORCO/VICODIN) 5-325 MG tablet Take by mouth as needed. 11/22/20  Yes [provider]  meloxicam (MOBIC) 15 MG tablet Take 15 mg by mouth as needed for pain.   Yes [provider]  tiZANidine (ZANAFLEX) 2 MG tablet Take 2 mg by mouth daily as needed for muscle pain. 07/15/16  Yes [provider]  Triamcinolone Acetonide (NASACORT ALLERGY 24HR NA) Place 2 sprays into the nose daily as needed (allergies).   Yes [provider]  Vitamin D, Ergocalciferol, (DRISDOL) 1.25 MG (50000 UNIT) CAPS capsule Take 50,000 Units by mouth every 7 (seven) days. 11/26/20  Yes [provider]  Zoledronic Acid (RECLAST IV) Inject into the vein.   Yes [provider]    Family History Family History  Problem Relation Age of Onset   Heart disease Mother    Heart disease Father    Hypertension Sister    Diabetes Maternal  Grandmother    Heart disease Maternal Grandfather    Diabetes Maternal Grandfather    Heart disease Paternal Grandmother    Breast cancer Neg Hx     Social History Social History   Tobacco Use   Smoking status: Never   Smokeless tobacco: Never  Vaping Use   Vaping Use: Never used  Substance Use Topics   Alcohol use: Yes    Comment: OCCAS   Drug use: Never     Allergies   Celecoxib, Oxaprozin, and Terbinafine   Review of Systems Review of Systems Per HPI  Physical Exam Triage Vital Signs ED Triage Vitals  Enc Vitals Group     BP 05/24/21 0836 (!)  134/54     Pulse Rate 05/24/21 0836 70     Resp 05/24/21 0836 18     Temp 05/24/21 0836 98.2 F (36.8 C)     Temp Source 05/24/21 0836 Oral     SpO2 05/24/21 0836 97 %     Weight 05/24/21 0835 188 lb (85.3 kg)     Height 05/24/21 0835 5\' 7"  (1.702 m)     Head Circumference --      Peak Flow --      Pain Score 05/24/21 0834 3     Pain Loc --      Pain Edu? --      Excl. in GC? --    No data found.  Updated Vital Signs BP (!) 134/54 (BP Location: Left Arm)   Pulse 70   Temp 98.2 F (36.8 C) (Oral)   Resp 18   Ht 5\' 7"  (1.702 m)   Wt 85.3 kg   SpO2 97%   BMI 29.44 kg/m   Visual Acuity Right Eye Distance:   Left Eye Distance:   Bilateral Distance:    Right Eye Near:   Left Eye Near:    Bilateral Near:     Physical Exam Vitals and nursing note reviewed.  Constitutional:      General: She is not in acute distress.    Appearance: Normal appearance.  HENT:     Head: Normocephalic and atraumatic.  Pulmonary:     Effort: Pulmonary effort is normal. No respiratory distress.  Musculoskeletal:     Comments: Left hand -left ring finger with mild tenderness over the MCP joint.  There is no appreciable bruising at this time.  No significant tenderness over the PIP or DIP joints.  Neurological:     Mental Status: She is alert.  Psychiatric:        Mood and Affect: Mood normal.        Behavior: Behavior normal.     UC Treatments / Results  Labs (all labs ordered are listed, but only abnormal results are displayed) Labs Reviewed - No data to display  EKG   Radiology DG Finger Ring Left  Result Date: 05/24/2021 CLINICAL DATA:  Status post fall with pain in the left ring finger. EXAM: LEFT RING FINGER 2+V COMPARISON:  None. FINDINGS: There is no evidence of fracture or dislocation. Soft tissues are unremarkable. IMPRESSION: No acute fracture or dislocation Electronically Signed   By: M.D.   On: 05/24/2021 09:01    Procedures Procedures (including  critical care time)  Medications Ordered in UC Medications - No data to display  Initial Impression / Assessment and Plan / UC Course  I have reviewed the triage vital signs and the nursing notes.  Pertinent labs & imaging results that were available  during my care of the patient were reviewed by me and considered in my medical decision making (see chart for details).    70 year old female presents with injury to left ring finger.  X-ray was obtained and was independently reviewed by me.  Interpretation: Normal x-ray.  No evidence of fracture.  Advise rest, ice, elevation.  Tylenol/ibuprofen as needed.  Supportive care.  Final Clinical Impressions(s) / UC Diagnoses   Final diagnoses:  Injury of finger of left hand, initial encounter     Discharge Instructions      Rest. Ice, elevation.  OTC Tylenol and/or Ibuprofen as needed for pain.  Take care  Dr. Adriana Simas      ED Prescriptions   None    PDMP not reviewed this encounter.   Tommie Sams, [place] 05/24/21 [phone]

## 2021-08-26 ENCOUNTER — Ambulatory Visit
Admission: RE | Admit: 2021-08-26 | Discharge: 2021-08-26 | Disposition: A | Discharge status: Home / Self Care | Payer: Medicare HMO | Source: Ambulatory Visit | Attending: Internal Medicine | Admitting: Internal Medicine

## 2021-08-26 ENCOUNTER — Other Ambulatory Visit: Payer: Self-pay

## 2021-08-26 DIAGNOSIS — Z1231 Encounter for screening mammogram for malignant neoplasm of breast: Secondary | ICD-10-CM | POA: Insufficient documentation

## 2021-08-26 IMAGING — MG MM DIGITAL SCREENING BILAT W/ TOMO AND CAD
6 of 10 series · 6 of 30 positions shown · non-contrast
Comparison: Previous exam(s).

CLINICAL DATA: Screening.

EXAM:
DIGITAL SCREENING BILATERAL MAMMOGRAM WITH TOMOSYNTHESIS AND CAD
TECHNIQUE: Bilateral screening digital craniocaudal and mediolateral oblique
mammograms were obtained. Bilateral screening digital breast
tomosynthesis was performed. The images were evaluated with
computer-aided detection.

[L CC synth-2D (1 of 2)]
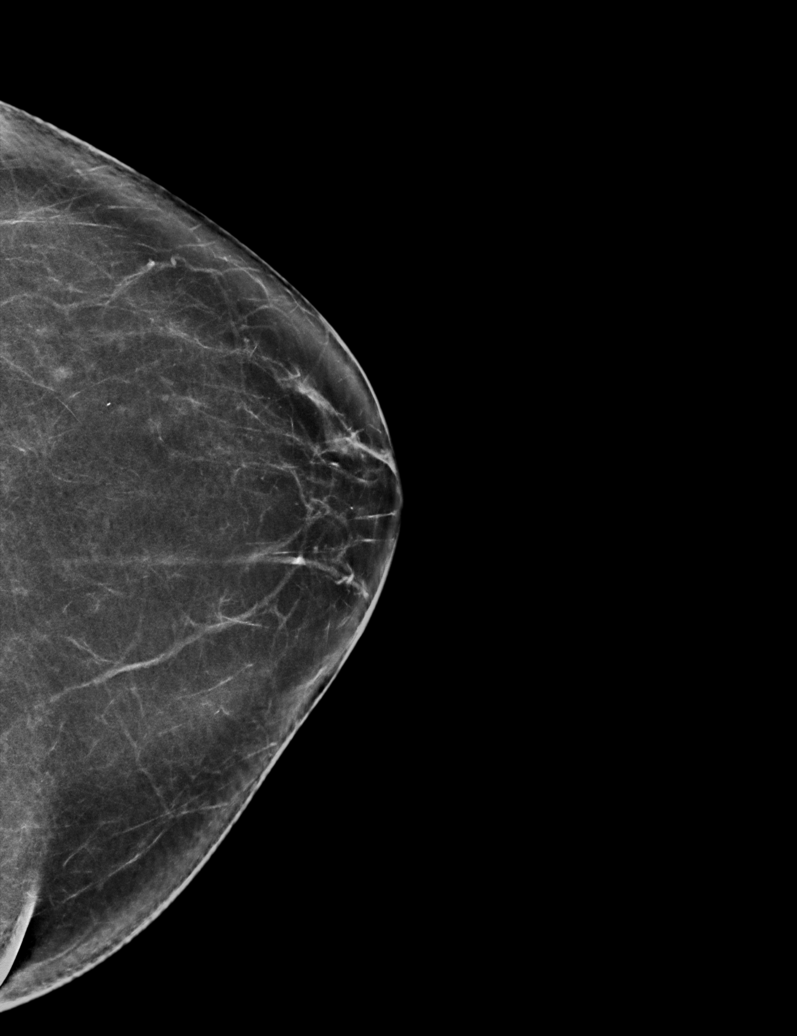

[R CC synth-2D]
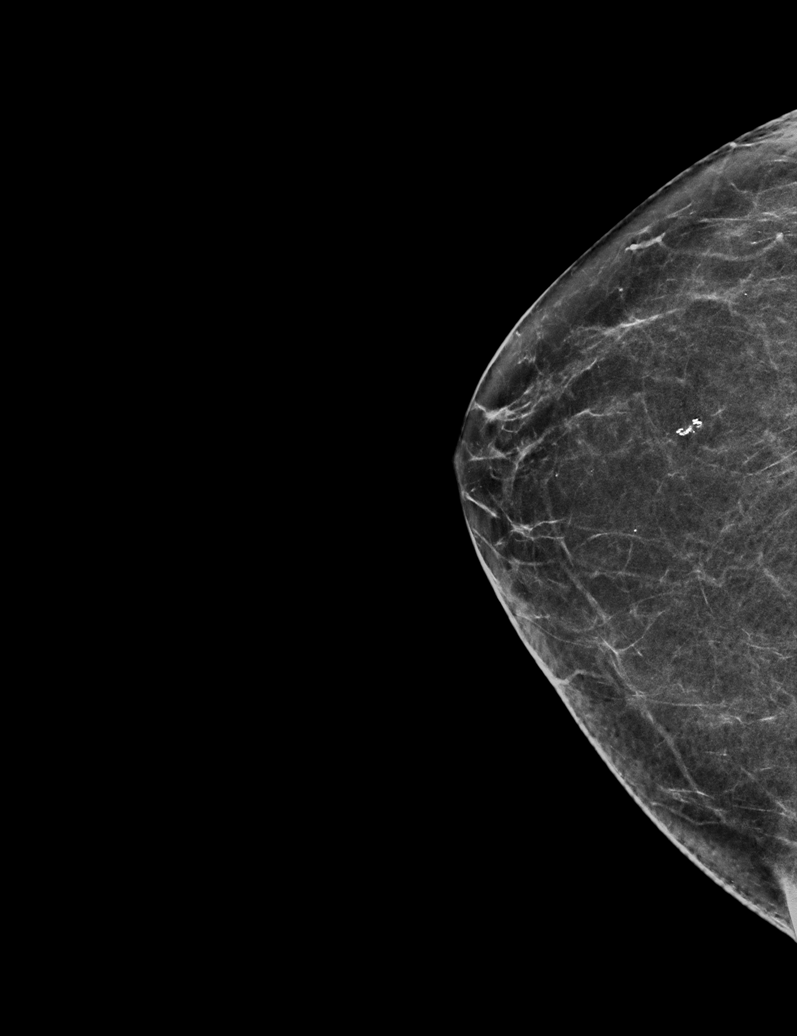

[R MLO synth-2D]
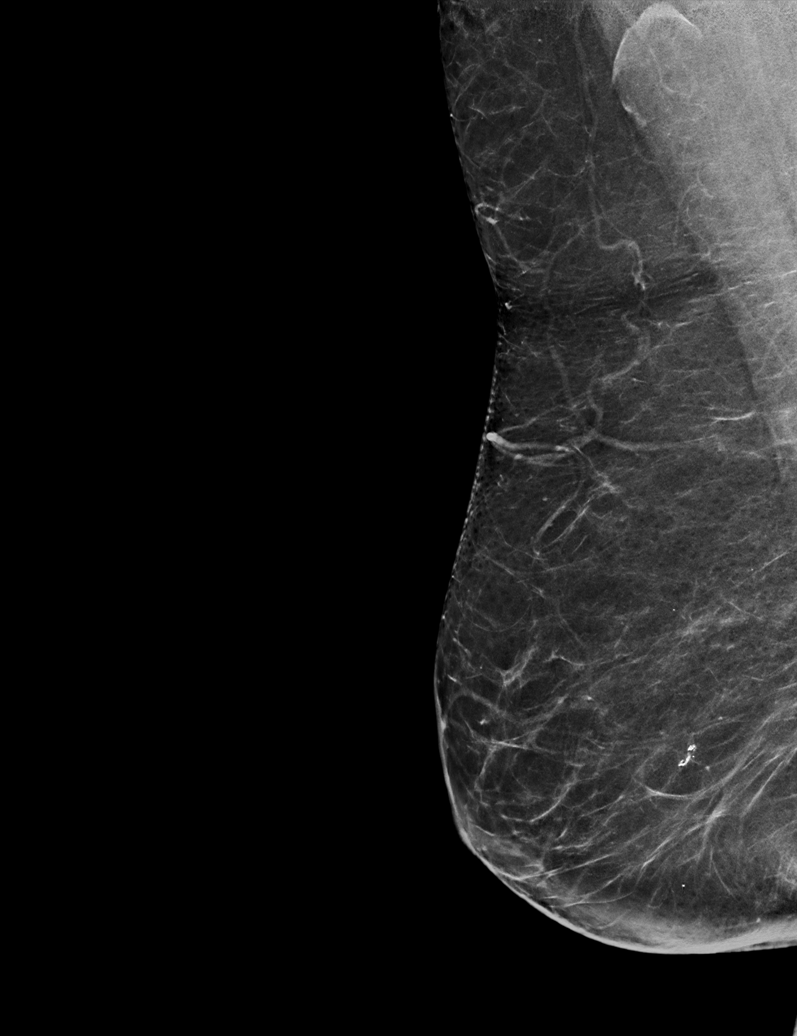

[L CC synth-2D (2 of 2)]
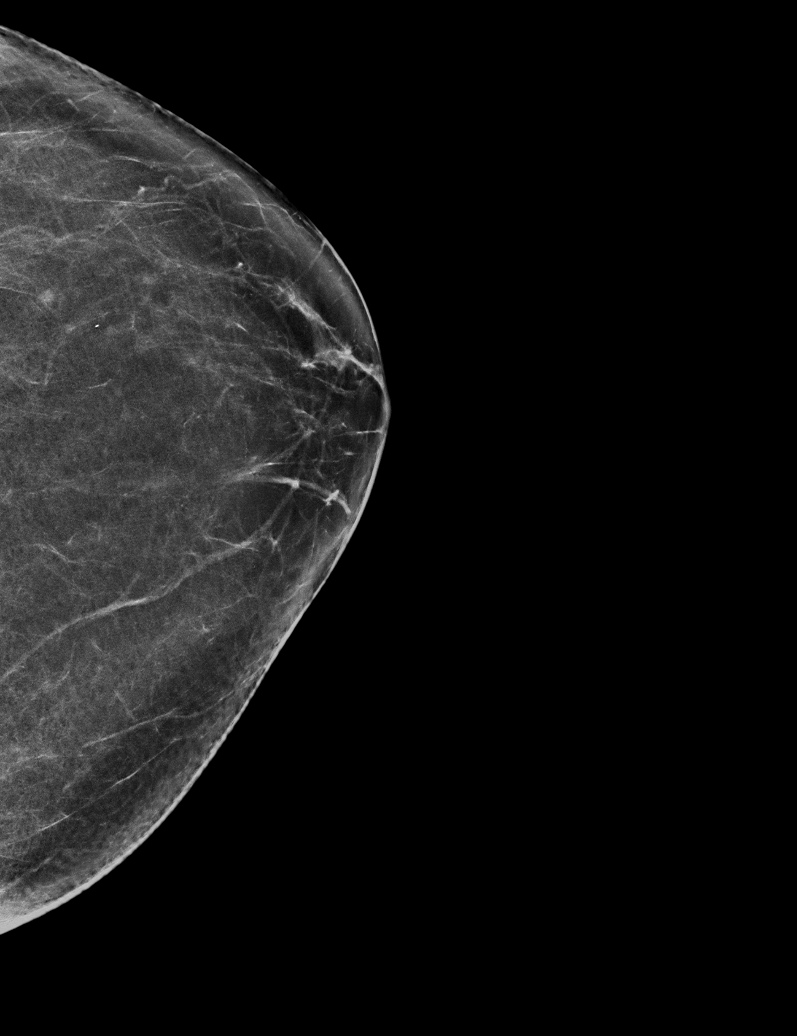

[L MLO synth-2D]
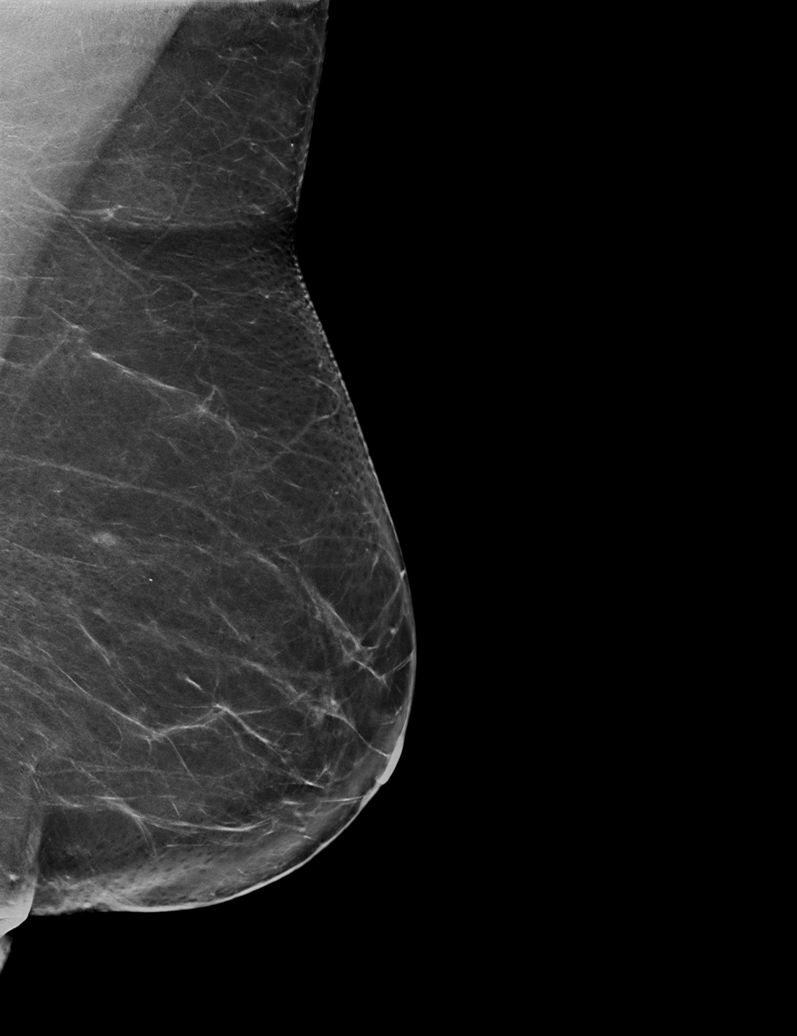

[R CC tomo · tomo slice 31/60.0]
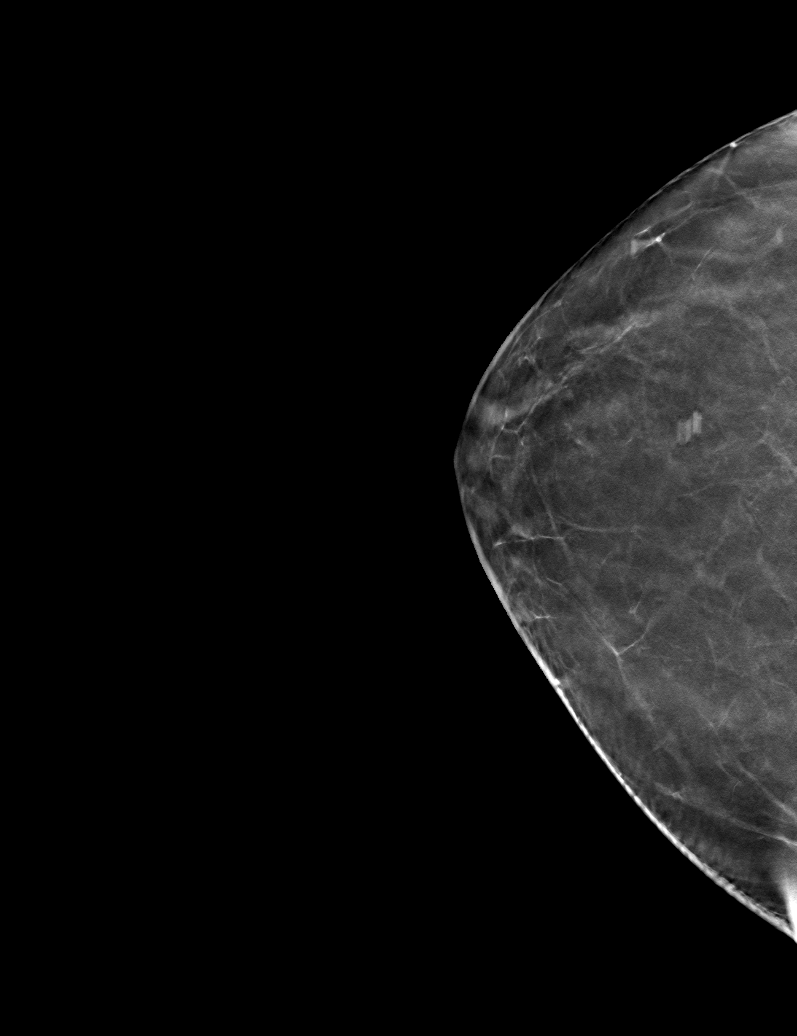

[6 of 30 positions shown; findings below may reference images not displayed]

ACR Breast Density Category b: There are scattered areas of
fibroglandular density.
FINDINGS: There are no findings suspicious for malignancy.
IMPRESSION: No mammographic evidence of malignancy. A result letter of this
screening mammogram will be mailed directly to the patient.

RECOMMENDATION:
Screening mammogram in one year. (Code:51-O-LD2)

BI-RADS CATEGORY  1: Negative.

## 2021-08-29 DIAGNOSIS — M4807 Spinal stenosis, lumbosacral region: Secondary | ICD-10-CM | POA: Diagnosis not present

## 2021-08-29 DIAGNOSIS — M5416 Radiculopathy, lumbar region: Secondary | ICD-10-CM | POA: Diagnosis not present

## 2021-08-29 DIAGNOSIS — M48061 Spinal stenosis, lumbar region without neurogenic claudication: Secondary | ICD-10-CM | POA: Diagnosis not present

## 2021-08-29 DIAGNOSIS — M5136 Other intervertebral disc degeneration, lumbar region: Secondary | ICD-10-CM | POA: Diagnosis not present

## 2021-08-29 DIAGNOSIS — M47816 Spondylosis without myelopathy or radiculopathy, lumbar region: Secondary | ICD-10-CM | POA: Diagnosis not present

## 2021-09-23 ENCOUNTER — Encounter: Payer: Self-pay | Admitting: Internal Medicine

## 2021-09-23 ENCOUNTER — Ambulatory Visit (INDEPENDENT_AMBULATORY_CARE_PROVIDER_SITE_OTHER): Payer: Medicare HMO | Admitting: Internal Medicine

## 2021-09-23 ENCOUNTER — Other Ambulatory Visit: Payer: Self-pay

## 2021-09-23 VITALS — BP 104/68 | HR 76 | Ht 67.0 in | Wt 194.6 lb

## 2021-09-23 DIAGNOSIS — M810 Age-related osteoporosis without current pathological fracture: Secondary | ICD-10-CM | POA: Diagnosis not present

## 2021-09-23 DIAGNOSIS — E782 Mixed hyperlipidemia: Secondary | ICD-10-CM | POA: Diagnosis not present

## 2021-09-23 DIAGNOSIS — Z0000 Encounter for general adult medical examination without abnormal findings: Secondary | ICD-10-CM

## 2021-09-23 DIAGNOSIS — Z1159 Encounter for screening for other viral diseases: Secondary | ICD-10-CM

## 2021-09-23 DIAGNOSIS — Z23 Encounter for immunization: Secondary | ICD-10-CM

## 2021-09-23 DIAGNOSIS — Z1211 Encounter for screening for malignant neoplasm of colon: Secondary | ICD-10-CM

## 2021-09-23 NOTE — Progress Notes (Signed)
Date:  09/23/2021   Name:  Jennifer Morrow   DOB:  09/02/1950   MRN:  [id number]   Chief Complaint: Annual Exam (Breast Exam. No pap- discontinued.) AUBREANA Morrow is a 71 y.o. female who presents today for her Complete Annual Exam. She feels poorly. She reports exercising several times per week. She reports she is sleeping poorly. Breast complaints - none.  Mammogram: 08/2021 DEXA: 08/2020 did not tolerate Reclast Pap smear: discontinued Colonoscopy: 02/2020  Immunization History  Administered Date(s) Administered   Fluad Quad(high Dose 65+) 05/01/2019, 05/19/2021   Hepatitis A 11/01/2002, 05/01/2003   Hepatitis B, adult 08/17/1990   IPV 08/17/1990   Influenza, High Dose Seasonal PF 05/07/2017, 05/05/2018, 04/06/2020   Influenza, Seasonal, Injecte, Preservative Fre 05/09/2013   Influenza,inj,Quad PF,6+ Mos 05/20/2016   Influenza-Unspecified 06/08/2015   Meningococcal Conjugate 06/28/2018   PFIZER Comirnaty(Gray Top)Covid-19 Tri-Sucrose Vaccine 09/09/2019, 10/01/2019, 04/06/2020   PFIZER(Purple Top)SARS-COV-2 Vaccination 09/09/2019, 10/01/2019   Pfizer Covid-19 Vaccine Bivalent Booster 57yr & up 05/19/2021   Pneumococcal Conjugate-13 07/26/2018   Pneumococcal Polysaccharide-23 07/21/2017   Rabies Immune Globulin 06/28/2018, 07/05/2018, 07/19/2018   Rubella 08/17/1981   Smallpox 08/17/1981   Td 08/17/1994   Tdap 01/09/2014   Typhoid Live 06/28/2018   Yellow Fever 06/03/2018   Zoster Recombinat (Shingrix) 11/02/2017, 03/08/2018   Zoster, Live 05/10/2013    HPI  No results found for: NA, K, CO2, GLUCOSE, BUN, CREATININE, CALCIUM, EGFR, GFRNONAA, GLUCOSE No results found for: CHOL, HDL, LDLCALC, LDLDIRECT, TRIG, CHOLHDL No results found for: TSH No results found for: HGBA1C No results found for: WBC, HGB, HCT, MCV, PLT No results found for: ALT, AST, GGT, ALKPHOS, BILITOT Lab Results  Component Value Date   VD25OH 42.6 05/23/2021     Review of Systems  Constitutional:   Negative for chills, fatigue and fever.  HENT:  Negative for congestion, hearing loss, tinnitus, trouble swallowing and voice change.   Eyes:  Negative for visual disturbance.  Respiratory:  Positive for shortness of breath. Negative for cough, chest tightness and wheezing.   Cardiovascular:  Negative for chest pain, palpitations and leg swelling.  Gastrointestinal:  Negative for abdominal pain, constipation, diarrhea and vomiting.  Endocrine: Negative for polydipsia and polyuria.  Genitourinary:  Negative for dysuria, frequency, genital sores, vaginal bleeding and vaginal discharge.  Musculoskeletal:  Positive for back pain and gait problem (uses cane). Negative for arthralgias and joint swelling.  Skin:  Negative for color change and rash.  Neurological:  Negative for dizziness, tremors, light-headedness and headaches.  Hematological:  Negative for adenopathy. Does not bruise/bleed easily.  Psychiatric/Behavioral:  Negative for dysphoric mood and sleep disturbance. The patient is not nervous/anxious.    Patient Active Problem List   Diagnosis Date Noted   Age-related osteoporosis without current pathological fracture 05/19/2021   Spondylosis of lumbar region without myelopathy or radiculopathy 05/19/2021   Degenerative disc disease, lumbar 05/19/2021   Scoliosis, unspecified 01/09/2015   Vitamin D deficiency 01/08/2014   Mixed hyperlipidemia 01/08/2014    Allergies  Allergen Reactions   Celecoxib Other (See Comments)    Elevated LFT's   Oxaprozin Other (See Comments)    Elevated LFT's   Terbinafine Other (See Comments)    Elevated LFT's    Past Surgical History:  Procedure Laterality Date   CATARACT EXTRACTION W/PHACO Left 01/06/2018   Procedure: CATARACT EXTRACTION PHACO AND INTRAOCULAR LENS PLACEMENT ([place];  Surgeon: KEulogio Bear MD;  Location: [facility] ORS;  Service: Ophthalmology;  Laterality: Left;  [place]  00:27.5 AP% 4.3 CDE 1.18 FLUID PACK LOT # [id number]   CATARACT  EXTRACTION W/PHACO Right 02/10/2018   Procedure: CATARACT EXTRACTION PHACO AND INTRAOCULAR LENS PLACEMENT (IOC);  Surgeon: Eulogio Bear, MD;  Location: [facility] ORS;  Service: Ophthalmology;  Laterality: Right;  [place] 00:30.5 AP% 5.0 CDE 1.51  Fluid pack lot # [id number]   FOOT X 2     HEEL SPUR EXCISION     TONSILLECTOMY      Social History   Tobacco Use   Smoking status: Never   Smokeless tobacco: Never  Vaping Use   Vaping Use: Never used  Substance Use Topics   Alcohol use: Yes    Comment: OCCAS   Drug use: Never     Medication list has been reviewed and updated.  Current Meds  Medication Sig   Calcium-Vitamin D-Vitamin K 650-12.5-40 MG-MCG-MCG CHEW    cetirizine (ZYRTEC) 10 MG tablet Take 10 mg by mouth daily as needed for allergies.   fluticasone (FLONASE) 50 MCG/ACT nasal spray Place into the nose.   gabapentin (NEURONTIN) 300 MG capsule Take 1 capsule by mouth 3 (three) times daily.   HYDROcodone-acetaminophen (NORCO/VICODIN) 5-325 MG tablet Take by mouth as needed.   meloxicam (MOBIC) 15 MG tablet Take 15 mg by mouth as needed for pain.   tiZANidine (ZANAFLEX) 2 MG tablet Take 2 mg by mouth daily as needed for muscle pain.   Triamcinolone Acetonide (NASACORT ALLERGY 24HR NA) Place 2 sprays into the nose daily as needed (allergies).   Vitamin D, Ergocalciferol, (DRISDOL) 1.25 MG (50000 UNIT) CAPS capsule Take 50,000 Units by mouth every 7 (seven) days.   Zoledronic Acid (RECLAST IV) Inject into the vein.    PHQ 2/9 Scores 09/23/2021 05/20/2021  PHQ - 2 Score 0 0  PHQ- 9 Score 3 1    GAD 7 : Generalized Anxiety Score 09/23/2021 05/20/2021  Nervous, Anxious, on Edge 0 0  Control/stop worrying 0 0  Worry too much - different things 0 0  Trouble relaxing 0 0  Restless 0 0  Easily annoyed or irritable 0 0  Afraid - awful might happen 0 0  Total GAD 7 Score 0 0  Anxiety Difficulty Not difficult at all -    BP Readings from Last 3 Encounters:  09/23/21 104/68   05/24/21 (!) 134/54  05/20/21 122/84    Physical Exam Vitals and nursing note reviewed.  Constitutional:      General: She is not in acute distress.    Appearance: She is well-developed.  HENT:     Head: Normocephalic and atraumatic.     Right Ear: Tympanic membrane and ear canal normal.     Left Ear: Tympanic membrane and ear canal normal.     Nose:     Right Sinus: No maxillary sinus tenderness.     Left Sinus: No maxillary sinus tenderness.  Eyes:     General: No scleral icterus.       Right eye: No discharge.        Left eye: No discharge.     Conjunctiva/sclera: Conjunctivae normal.  Neck:     Thyroid: No thyromegaly.     Vascular: No carotid bruit.  Cardiovascular:     Rate and Rhythm: Normal rate and regular rhythm.     Pulses: Normal pulses.     Heart sounds: Normal heart sounds.  Pulmonary:     Effort: Pulmonary effort is normal. No respiratory distress.     Breath sounds: No wheezing.  Chest:  Breasts:    Right: No mass, nipple discharge, skin change or tenderness.     Left: No mass, nipple discharge, skin change or tenderness.  Abdominal:     General: Bowel sounds are normal.     Palpations: Abdomen is soft.     Tenderness: There is no abdominal tenderness.  Musculoskeletal:        General: Normal range of motion.     Cervical back: Normal range of motion. No erythema.     Right lower leg: No edema.     Left lower leg: No edema.  Lymphadenopathy:     Cervical: No cervical adenopathy.  Skin:    General: Skin is warm and dry.     Capillary Refill: Capillary refill takes less than 2 seconds.     Findings: No rash.  Neurological:     General: No focal deficit present.     Mental Status: She is alert and oriented to person, place, and time.     Cranial Nerves: No cranial nerve deficit.     Sensory: No sensory deficit.     Deep Tendon Reflexes: Reflexes are normal and symmetric.  Psychiatric:        Attention and Perception: Attention normal.         Mood and Affect: Mood normal.    Wt Readings from Last 3 Encounters:  09/23/21 194 lb 9.6 oz (88.3 kg)  05/24/21 188 lb (85.3 kg)  05/20/21 192 lb (87.1 kg)    BP 104/68    Pulse 76    Ht '5\' 7"'  (1.702 m)    Wt 194 lb 9.6 oz (88.3 kg)    SpO2 97%    BMI 30.48 kg/m   Assessment and Plan: 1. Annual physical exam Exam is normal except for weight. Encourage regular exercise and appropriate dietary changes as able. Up to date on screenings and immunizations. - CBC with Differential/Platelet - TSH  2. Colon cancer screening Will request records [facility]  3. Need for hepatitis C screening test - Hepatitis C antibody  4. Mixed hyperlipidemia Will check labs and advise - Comprehensive metabolic panel - Lipid panel  5. Age-related osteoporosis without current pathological fracture Had side effects to Reclast Will see Endo in June to consider next treatment - possibly Prolia  6. Need for vaccination for pneumococcus - Pneumococcal conjugate vaccine 20-valent   Partially dictated using Editor, commissioning. Any errors are unintentional.  Halina Maidens, MD [facility]  09/23/2021

## 2021-09-24 LAB — CBC WITH DIFFERENTIAL/PLATELET
Basophils Absolute: 0 10*3/uL (ref 0.0–0.2)
Basos: 1 %
EOS (ABSOLUTE): 0 10*3/uL (ref 0.0–0.4)
Eos: 1 %
Hematocrit: 44.2 % (ref 34.0–46.6)
Hemoglobin: 15.3 g/dL (ref 11.1–15.9)
Immature Grans (Abs): 0 10*3/uL (ref 0.0–0.1)
Immature Granulocytes: 0 %
Lymphocytes Absolute: 1.9 10*3/uL (ref 0.7–3.1)
Lymphs: 47 %
MCH: 31 pg (ref 26.6–33.0)
MCHC: 34.6 g/dL (ref 31.5–35.7)
MCV: 90 fL (ref 79–97)
Monocytes Absolute: 0.3 10*3/uL (ref 0.1–0.9)
Monocytes: 8 %
Neutrophils Absolute: 1.7 10*3/uL (ref 1.4–7.0)
Neutrophils: 43 %
Platelets: 291 10*3/uL (ref 150–450)
RBC: 4.94 x10E6/uL (ref 3.77–5.28)
RDW: 12.9 % (ref 11.7–15.4)
WBC: 4 10*3/uL (ref 3.4–10.8)

## 2021-09-24 LAB — COMPREHENSIVE METABOLIC PANEL
ALT: 60 IU/L — ABNORMAL HIGH (ref 0–32)
AST: 30 IU/L (ref 0–40)
Albumin/Globulin Ratio: 2 (ref 1.2–2.2)
Albumin: 4.7 g/dL (ref 3.8–4.8)
Alkaline Phosphatase: 58 IU/L (ref 44–121)
BUN/Creatinine Ratio: 32 — ABNORMAL HIGH (ref 12–28)
BUN: 24 mg/dL (ref 8–27)
Bilirubin Total: 0.4 mg/dL (ref 0.0–1.2)
CO2: 23 mmol/L (ref 20–29)
Calcium: 9.3 mg/dL (ref 8.7–10.3)
Chloride: 105 mmol/L (ref 96–106)
Creatinine, Ser: 0.75 mg/dL (ref 0.57–1.00)
Globulin, Total: 2.3 g/dL (ref 1.5–4.5)
Glucose: 96 mg/dL (ref 70–99)
Potassium: 4.9 mmol/L (ref 3.5–5.2)
Sodium: 142 mmol/L (ref 134–144)
Total Protein: 7 g/dL (ref 6.0–8.5)
eGFR: 86 mL/min/{1.73_m2} (ref >59)

## 2021-09-24 LAB — LIPID PANEL
Chol/HDL Ratio: 3.1 ratio (ref 0.0–4.4)
Cholesterol, Total: 222 mg/dL — ABNORMAL HIGH (ref 100–199)
HDL: 72 mg/dL (ref >39)
LDL Chol Calc (NIH): 135 mg/dL — ABNORMAL HIGH (ref 0–99)
Triglycerides: 85 mg/dL (ref 0–149)
VLDL Cholesterol Cal: 15 mg/dL (ref 5–40)

## 2021-09-24 LAB — HEPATITIS C ANTIBODY: Hep C Virus Ab: <0.1 s/co ratio (ref 0.0–0.9)

## 2021-09-24 LAB — TSH: TSH: 2.54 u[IU]/mL (ref 0.450–4.500)

## 2021-09-27 ENCOUNTER — Encounter: Payer: Self-pay | Admitting: Internal Medicine

## 2021-10-31 ENCOUNTER — Telehealth: Payer: Self-pay | Admitting: Internal Medicine

## 2021-10-31 NOTE — Telephone Encounter (Signed)
Copied from [place] [id number] [id number]. Topic: Medicare AWV ?>> Oct 31, 2021  9:38 AM Cher Nakai R wrote: ?Reason for CRM:  ?Left message for patient to call back and schedule Medicare Annual Wellness Visit (AWV) in office.  ? ?If unable to come into the office for AWV,  please offer to do virtually or by telephone. ? ?No hx of AWV eligible for AWVI per [place] as of 02/14/2018 ? ?Please schedule at anytime with [facility] Health Advisor.     ? ?40 Minutes appointment  ? ?Any questions, please call me at [phone] ?

## 2021-11-26 DIAGNOSIS — Z86018 Personal history of other benign neoplasm: Secondary | ICD-10-CM | POA: Diagnosis not present

## 2021-11-26 DIAGNOSIS — L578 Other skin changes due to chronic exposure to nonionizing radiation: Secondary | ICD-10-CM | POA: Diagnosis not present

## 2021-11-26 DIAGNOSIS — Z872 Personal history of diseases of the skin and subcutaneous tissue: Secondary | ICD-10-CM | POA: Diagnosis not present

## 2021-12-23 DIAGNOSIS — M5136 Other intervertebral disc degeneration, lumbar region: Secondary | ICD-10-CM | POA: Diagnosis not present

## 2021-12-23 DIAGNOSIS — M5416 Radiculopathy, lumbar region: Secondary | ICD-10-CM | POA: Diagnosis not present

## 2021-12-23 DIAGNOSIS — M47816 Spondylosis without myelopathy or radiculopathy, lumbar region: Secondary | ICD-10-CM | POA: Diagnosis not present

## 2021-12-24 ENCOUNTER — Ambulatory Visit: Payer: Medicare HMO

## 2021-12-31 ENCOUNTER — Telehealth: Payer: Self-pay | Admitting: Internal Medicine

## 2021-12-31 NOTE — Telephone Encounter (Signed)
Copied from [place] [id number]. Topic: Medicare AWV ?>> Dec 31, 2021  9:38 AM Cher Nakai R wrote: ?Reason for CRM:  ?Left message for patient to call back and schedule Medicare Annual Wellness Visit (AWV) in office.  ? ?If unable to come into the office for AWV,  please offer to do virtually or by telephone. ? ?No hx of AWV eligible for AWVI per [place] as of 02/14/2018  ? ?Please schedule at anytime with [facility] Health Advisor.     ? ?45 minute appointment  ? ?Any questions, please call me at [phone] ?

## 2022-01-08 ENCOUNTER — Encounter: Payer: Self-pay | Admitting: Internal Medicine

## 2022-01-13 DIAGNOSIS — H26493 Other secondary cataract, bilateral: Secondary | ICD-10-CM | POA: Diagnosis not present

## 2022-01-13 NOTE — Telephone Encounter (Signed)
Patient returning call and states please call her on her preferred # [phone] and not her cell phone. Patient stated she received a voicemail on her [phone] [facility]) and was inquiring about a link. Advised caller the health advisor will call her on her preferred # and provide any additional information pertaining to a link.

## 2022-01-14 ENCOUNTER — Ambulatory Visit (INDEPENDENT_AMBULATORY_CARE_PROVIDER_SITE_OTHER): Payer: Medicare HMO

## 2022-01-14 DIAGNOSIS — M47812 Spondylosis without myelopathy or radiculopathy, cervical region: Secondary | ICD-10-CM | POA: Insufficient documentation

## 2022-01-14 DIAGNOSIS — Z0000 Encounter for general adult medical examination without abnormal findings: Secondary | ICD-10-CM

## 2022-01-14 NOTE — Progress Notes (Signed)
Subjective:   Jennifer Morrow is a 71 y.o. female who presents for Medicare Annual (Subsequent) preventive examination.  Virtual Visit via Telephone Note  I connected with  Jennifer Morrow on 01/14/22 at 11:15 AM EDT by telephone and verified that I am speaking with the correct person using two identifiers.  Location: Patient: home Provider: [facility] Persons participating in the virtual visit: patient/Nurse Health Advisor   I discussed the limitations, risks, security and privacy concerns of performing an evaluation and management service by telephone and the availability of in person appointments. The patient expressed understanding and agreed to proceed.  Interactive audio and video telecommunications were attempted between this nurse and patient, however failed, due to patient having technical difficulties OR patient did not have access to video capability.  We continued and completed visit with audio only.  Some vital signs may be absent or patient reported.   Reather Littler, LPN   Review of Systems     Cardiac Risk Factors include: advanced age (>47men, >8 women)     Objective:    Today's Vitals   01/14/22 1120  PainSc: 5    There is no height or weight on file to calculate BMI.     01/14/2022   11:26 AM 02/10/2018    7:42 AM  Advanced Directives  Does Patient Have a Medical Advance Directive? Yes No  Type of Estate agent of [place];Living will   Copy of Healthcare Power of Attorney in Chart? No - copy requested     Current Medications (verified) Outpatient Encounter Medications as of 01/14/2022  Medication Sig   Calcium-Vitamin D-Vitamin K 650-12.5-40 MG-MCG-MCG CHEW    cetirizine (ZYRTEC) 10 MG tablet Take 10 mg by mouth daily as needed for allergies.   fluticasone (FLONASE) 50 MCG/ACT nasal spray Place into the nose.   gabapentin (NEURONTIN) 300 MG capsule Take 1 capsule by mouth 3 (three) times daily.   HYDROcodone-acetaminophen (NORCO/VICODIN)  5-325 MG tablet Take by mouth as needed. Pt taking 5 times per day   meloxicam (MOBIC) 15 MG tablet Take 15 mg by mouth as needed for pain.   OVER THE COUNTER MEDICATION CBD roll on ointment PRN   tiZANidine (ZANAFLEX) 2 MG tablet Take 2 mg by mouth daily as needed for muscle pain.   Triamcinolone Acetonide (NASACORT ALLERGY 24HR NA) Place 2 sprays into the nose daily as needed (allergies).   Vitamin D, Ergocalciferol, (DRISDOL) 1.25 MG (50000 UNIT) CAPS capsule Take 50,000 Units by mouth every 7 (seven) days.   [DISCONTINUED] Zoledronic Acid (RECLAST IV) Inject into the vein.   No facility-administered encounter medications on file as of 01/14/2022.    Allergies (verified) Celecoxib, Oxaprozin, and Terbinafine   History: Past Medical History:  Diagnosis Date   Arthritis    Bronchitis    CHRONIC   Bulging lumbar disc    degenerative dics   Scoliosis    LIMITS LUNG FUNCTION   Spinal stenosis    Past Surgical History:  Procedure Laterality Date   CATARACT EXTRACTION W/PHACO Left 01/06/2018   Procedure: CATARACT EXTRACTION PHACO AND INTRAOCULAR LENS PLACEMENT (IOC);  Surgeon: [place] Crane, MD;  Location: [facility] ORS;  Service: Ophthalmology;  Laterality: Left;  [place] 00:27.5 AP% 4.3 CDE 1.18 FLUID PACK LOT # [id number]   CATARACT EXTRACTION W/PHACO Right 02/10/2018   Procedure: CATARACT EXTRACTION PHACO AND INTRAOCULAR LENS PLACEMENT (IOC);  Surgeon: Nevada Crane, MD;  Location: [facility] ORS;  Service: Ophthalmology;  Laterality: Right;  [place] 00:30.5  AP% 5.0 CDE 1.51  Fluid pack lot # [id number]   FOOT X 2     HEEL SPUR EXCISION     TONSILLECTOMY     Family History  Problem Relation Age of Onset   Heart disease Mother    Heart disease Father    Hypertension Sister    Diabetes Maternal Grandmother    Heart disease Maternal Grandfather    Diabetes Maternal Grandfather    Heart disease Paternal Grandmother    Breast cancer Neg Hx    Social History   Socioeconomic History    Marital status: Widowed    Spouse name: Not on file   Number of children: 2   Years of education: Not on file   Highest education level: Not on file  Occupational History   Not on file  Tobacco Use   Smoking status: Never   Smokeless tobacco: Never  Vaping Use   Vaping Use: Never used  Substance and Sexual Activity   Alcohol use: Yes    Comment: OCCAS   Drug use: Never   Sexual activity: Not Currently  Other Topics Concern   Not on file  Social History Narrative   Pt has had one miscarriage   Pt lives alone   Social Determinants of Health   Financial Resource Strain: Low Risk    Difficulty of Paying Living Expenses: Not hard at all  Food Insecurity: No Food Insecurity   Worried About Programme researcher, broadcasting/film/videounning Out of Food in the Last Year: Never true   Ran Out of Food in the Last Year: Never true  Transportation Needs: No Transportation Needs   Lack of Transportation (Medical): No   Lack of Transportation (Non-Medical): No  Physical Activity: Sufficiently Active   Days of Exercise per Week: 3 days   Minutes of Exercise per Session: 50 min  Stress: No Stress Concern Present   Feeling of Stress : Not at all  Social Connections: Moderately Isolated   Frequency of Communication with Friends and Family: Twice a week   Frequency of Social Gatherings with Friends and Family: Once a week   Attends Religious Services: More than 4 times per year   Active Member of [employer] or Organizations: No   Attends BankerClub or Organization Meetings: Never   Marital Status: Widowed    Tobacco Counseling Counseling given: Not Answered   Clinical Intake:  Pre-visit preparation completed: Yes  Pain : 0-10 Pain Score: 5  Pain Type: Chronic pain Pain Location: Back Pain Orientation: Lower Pain Descriptors / Indicators: Aching, Sore Pain Onset: More than a month ago Pain Frequency: Intermittent     Nutritional Risks: None Diabetes: No  How often do you need to have someone help you when you read  instructions, pamphlets, or other written materials from your doctor or pharmacy?: 1 - Never   Interpreter Needed?: No  Information entered by :: Reather LittlerKasey Lamond Glantz LPN   Activities of Daily Living    01/14/2022   11:27 AM 01/08/2022    7:39 AM  In your present state of health, do you have any difficulty performing the following activities:  Hearing? 0 0  Vision? 0 0  Difficulty concentrating or making decisions? 0 0  Walking or climbing stairs? 0 1  Dressing or bathing? 0 0  Doing errands, shopping? 0 0  Preparing Food and eating ? N N  Using the Toilet? N N  In the past six months, have you accidently leaked urine? N   Do you have problems with loss  of bowel control? N N  Managing your Medications? N N  Managing your Finances? N N  Housekeeping or managing your Housekeeping? N Y    Patient Care Team: Reubin Milan, MD as PCP - General (Internal Medicine) Meeler, Jodelle Gross, FNP (Physical Medicine and Rehabilitation) Sherlon Handing, MD as Consulting Physician (Endocrinology) Jesusita Oka, MD (Dermatology) Merri Ray, MD as Referring Physician (Physical Medicine and Rehabilitation)  Indicate any recent Medical Services you may have received from other than [facility] providers in the past year (date may be approximate).     Assessment:   This is a routine wellness examination for Jennifer Morrow.  Hearing/Vision screen Hearing Screening - Comments:: Pt denies hearing difficulty Vision Screening - Comments:: Annual vision screenings done [facility] Dr. Brooke Dare  Dietary issues and exercise activities discussed: Current Exercise Habits: Structured exercise class, Type of exercise: Other - see comments (water aerobics, physical trainer), Time (Minutes): 50, Frequency (Times/Week): 3, Weekly Exercise (Minutes/Week): 150, Intensity: Moderate, Exercise limited by: orthopedic condition(s)   Goals Addressed   None    Depression Screen    01/14/2022   11:25 AM  09/23/2021    8:42 AM 05/20/2021   11:12 AM  PHQ 2/9 Scores  PHQ - 2 Score 0 0 0  PHQ- 9 Score  3 1    Fall Risk    01/14/2022   11:27 AM 01/08/2022    7:39 AM 09/23/2021    8:43 AM 05/20/2021   11:12 AM  Fall Risk   Falls in the past year? 1 0 1 0  Number falls in past yr: 1  1 0  Injury with Fall? 0  0 0  Risk for fall due to : History of fall(s)  History of fall(s) No Fall Risks  Follow up Falls prevention discussed  Falls evaluation completed Falls evaluation completed    FALL RISK PREVENTION PERTAINING TO THE HOME:  Any stairs in or around the home? Yes  If so, are there any without handrails? No  Home free of loose throw rugs in walkways, pet beds, electrical cords, etc? Yes  Adequate lighting in your home to reduce risk of falls? Yes   ASSISTIVE DEVICES UTILIZED TO PREVENT FALLS:  Life alert? No  Use of a cane, walker or w/c? Yes  Grab bars in the bathroom? Yes  Shower chair or bench in shower? Yes  Elevated toilet seat or a handicapped toilet? Yes   TIMED UP AND GO:  Was the test performed? No . Telephonic visit.    Cognitive Function: Normal cognitive status assessed by direct observation by this Nurse Health Advisor. No abnormalities found.         Immunizations Immunization History  Administered Date(s) Administered   Fluad Quad(high Dose 65+) 05/01/2019, 05/19/2021   Hepatitis A 11/01/2002, 05/01/2003   Hepatitis B, adult 08/17/1990   IPV 08/17/1990   Influenza, High Dose Seasonal PF 05/07/2017, 05/05/2018, 04/06/2020   Influenza, Seasonal, Injecte, Preservative Fre 05/09/2013   Influenza,inj,Quad PF,6+ Mos 05/20/2016   Influenza-Unspecified 06/08/2015   Meningococcal Conjugate 06/28/2018   PFIZER Comirnaty(Gray Top)Covid-19 Tri-Sucrose Vaccine 04/06/2020   PFIZER(Purple Top)SARS-COV-2 Vaccination 09/09/2019, 10/01/2019   PNEUMOCOCCAL CONJUGATE-20 09/23/2021   Pfizer Covid-19 Vaccine Bivalent Booster 45yrs & up 05/19/2021, 01/08/2022   Pneumococcal  Conjugate-13 07/26/2018   Pneumococcal Polysaccharide-23 07/21/2017   Rabies Immune Globulin 06/28/2018, 07/05/2018, 07/19/2018   Rubella 08/17/1981   Smallpox 08/17/1981   Td 08/17/1994   Tdap 01/09/2014   Typhoid Live 06/28/2018  Yellow Fever 06/03/2018   Zoster Recombinat (Shingrix) 11/02/2017, 03/08/2018   Zoster, Live 05/10/2013    TDAP status: Up to date  Flu Vaccine status: Up to date  Pneumococcal vaccine status: Up to date  Covid-19 vaccine status: Completed vaccines  Qualifies for Shingles Vaccine? Yes   Zostavax completed Yes   Shingrix Completed?: Yes  Screening Tests Health Maintenance  Topic Date Due   INFLUENZA VACCINE  03/17/2022   MAMMOGRAM  08/26/2022   TETANUS/TDAP  01/10/2024   COLONOSCOPY (Pts 45-31yrs Insurance coverage will need to be confirmed)  03/14/2030   Pneumonia Vaccine 45+ Years old  Completed   DEXA SCAN  Completed   COVID-19 Vaccine  Completed   Hepatitis C Screening  Completed   Zoster Vaccines- Shingrix  Completed   HPV VACCINES  Aged Out    Health Maintenance  There are no preventive care reminders to display for this patient.  Colorectal cancer screening: Type of screening: Colonoscopy. Completed 03/14/20. Repeat every 10 years  Mammogram status: Completed 08/26/21. Repeat every year  Bone Density status: Completed 09/04/20. Results reflect: Bone density results: OSTEOPOROSIS. Repeat every 2 years.  Lung Cancer Screening: (Low Dose CT Chest recommended if Age 28-80 years, 30 pack-year currently smoking OR have quit w/in 15years.) does not qualify.  Additional Screening:  Hepatitis C Screening: does qualify; Completed 09/23/21  Vision Screening: Recommended annual ophthalmology exams for early detection of glaucoma and other disorders of the eye. Is the patient up to date with their annual eye exam?  Yes  Who is the provider or what is the name of the office in which the patient attends annual eye exams? [facility].     Dental Screening: Recommended annual dental exams for proper oral hygiene  Community Resource Referral / Chronic Care Management: CRR required this visit?  No   CCM required this visit?  No      Plan:     I have personally reviewed and noted the following in the patient's chart:   Medical and social history Use of alcohol, tobacco or illicit drugs  Current medications and supplements including opioid prescriptions.  Functional ability and status Nutritional status Physical activity Advanced directives List of other physicians Hospitalizations, surgeries, and ER visits in previous 12 months Vitals Screenings to include cognitive, depression, and falls Referrals and appointments  In addition, I have reviewed and discussed with patient certain preventive protocols, quality metrics, and best practice recommendations. A written personalized care plan for preventive services as well as general preventive health recommendations were provided to patient.     Reather Littler, LPN   1/94/1740   Nurse Notes: none

## 2022-01-14 NOTE — Patient Instructions (Signed)
Jennifer Morrow , Thank you for taking time to come for your Medicare Wellness Visit. I appreciate your ongoing commitment to your health goals. Please review the following plan we discussed and let me know if I can assist you in the future.   Screening recommendations/referrals: Colonoscopy: done 03/14/20 Mammogram: done 08/26/21 Bone Density: done 09/04/20 Recommended yearly ophthalmology/optometry visit for glaucoma screening and checkup Recommended yearly dental visit for hygiene and checkup  Vaccinations: Influenza vaccine: done 05/19/21 Pneumococcal vaccine: done 09/23/21 Tdap vaccine: done 01/09/14 Shingles vaccine: done 11/02/17 & 03/08/18   Covid-19:done 09/09/19, 10/01/19, 04/06/20, 05/19/21 & 01/08/22  Advanced directives: Please bring a copy of your health care power of attorney and living will to the office at your convenience.   Conditions/risks identified: Keep up the great work!  Next appointment: Follow up in one year for your annual wellness visit    Preventive Care 65 Years and Older, Female Preventive care refers to lifestyle choices and visits with your health care provider that can promote health and wellness. What does preventive care include? A yearly physical exam. This is also called an annual well check. Dental exams once or twice a year. Routine eye exams. Ask your health care provider how often you should have your eyes checked. Personal lifestyle choices, including: Daily care of your teeth and gums. Regular physical activity. Eating a healthy diet. Avoiding tobacco and drug use. Limiting alcohol use. Practicing safe sex. Taking low-dose aspirin every day. Taking vitamin and mineral supplements as recommended by your health care provider. What happens during an annual well check? The services and screenings done by your health care provider during your annual well check will depend on your age, overall health, lifestyle risk factors, and family history of  disease. Counseling  Your health care provider may ask you questions about your: Alcohol use. Tobacco use. Drug use. Emotional well-being. Home and relationship well-being. Sexual activity. Eating habits. History of falls. Memory and ability to understand (cognition). Work and work Statistician. Reproductive health. Screening  You may have the following tests or measurements: Height, weight, and BMI. Blood pressure. Lipid and cholesterol levels. These may be checked every 5 years, or more frequently if you are over 83 years old. Skin check. Lung cancer screening. You may have this screening every year starting at age 34 if you have a 30-pack-year history of smoking and currently smoke or have quit within the past 15 years. Fecal occult blood test (FOBT) of the stool. You may have this test every year starting at age 85. Flexible sigmoidoscopy or colonoscopy. You may have a sigmoidoscopy every 5 years or a colonoscopy every 10 years starting at age 65. Hepatitis C blood test. Hepatitis B blood test. Sexually transmitted disease (STD) testing. Diabetes screening. This is done by checking your blood sugar (glucose) after you have not eaten for a while (fasting). You may have this done every 1-3 years. Bone density scan. This is done to screen for osteoporosis. You may have this done starting at age 16. Mammogram. This may be done every 1-2 years. Talk to your health care provider about how often you should have regular mammograms. Talk with your health care provider about your test results, treatment options, and if necessary, the need for more tests. Vaccines  Your health care provider may recommend certain vaccines, such as: Influenza vaccine. This is recommended every year. Tetanus, diphtheria, and acellular pertussis (Tdap, Td) vaccine. You may need a Td booster every 10 years. Zoster vaccine. You may need  this after age 25. Pneumococcal 13-valent conjugate (PCV13) vaccine. One  dose is recommended after age 92. Pneumococcal polysaccharide (PPSV23) vaccine. One dose is recommended after age 64. Talk to your health care provider about which screenings and vaccines you need and how often you need them. This information is not intended to replace advice given to you by your health care provider. Make sure you discuss any questions you have with your health care provider. Document Released: 08/30/2015 Document Revised: 04/22/2016 Document Reviewed: 06/04/2015 Elsevier Interactive Patient Education  2017 [place] Prevention in the Home Falls can cause injuries. They can happen to people of all ages. There are many things you can do to make your home safe and to help prevent falls. What can I do on the outside of my home? Regularly fix the edges of walkways and driveways and fix any cracks. Remove anything that might make you trip as you walk through a door, such as a raised step or threshold. Trim any bushes or trees on the path to your home. Use bright outdoor lighting. Clear any walking paths of anything that might make someone trip, such as rocks or tools. Regularly check to see if handrails are loose or broken. Make sure that both sides of any steps have handrails. Any raised decks and porches should have guardrails on the edges. Have any leaves, snow, or ice cleared regularly. Use sand or salt on walking paths during winter. Clean up any spills in your garage right away. This includes oil or grease spills. What can I do in the bathroom? Use night lights. Install grab bars by the toilet and in the tub and shower. Do not use towel bars as grab bars. Use non-skid mats or decals in the tub or shower. If you need to sit down in the shower, use a plastic, non-slip stool. Keep the floor dry. Clean up any water that spills on the floor as soon as it happens. Remove soap buildup in the tub or shower regularly. Attach bath mats securely with double-sided  non-slip rug tape. Do not have throw rugs and other things on the floor that can make you trip. What can I do in the bedroom? Use night lights. Make sure that you have a light by your bed that is easy to reach. Do not use any sheets or blankets that are too big for your bed. They should not hang down onto the floor. Have a firm chair that has side arms. You can use this for support while you get dressed. Do not have throw rugs and other things on the floor that can make you trip. What can I do in the kitchen? Clean up any spills right away. Avoid walking on wet floors. Keep items that you use a lot in easy-to-reach places. If you need to reach something above you, use a strong step stool that has a grab bar. Keep electrical cords out of the way. Do not use floor polish or wax that makes floors slippery. If you must use wax, use non-skid floor wax. Do not have throw rugs and other things on the floor that can make you trip. What can I do with my stairs? Do not leave any items on the stairs. Make sure that there are handrails on both sides of the stairs and use them. Fix handrails that are broken or loose. Make sure that handrails are as long as the stairways. Check any carpeting to make sure that it is firmly attached to  the stairs. Fix any carpet that is loose or worn. Avoid having throw rugs at the top or bottom of the stairs. If you do have throw rugs, attach them to the floor with carpet tape. Make sure that you have a light switch at the top of the stairs and the bottom of the stairs. If you do not have them, ask someone to add them for you. What else can I do to help prevent falls? Wear shoes that: Do not have high heels. Have rubber bottoms. Are comfortable and fit you well. Are closed at the toe. Do not wear sandals. If you use a stepladder: Make sure that it is fully opened. Do not climb a closed stepladder. Make sure that both sides of the stepladder are locked into place. Ask  someone to hold it for you, if possible. Clearly mark and make sure that you can see: Any grab bars or handrails. First and last steps. Where the edge of each step is. Use tools that help you move around (mobility aids) if they are needed. These include: Canes. Walkers. Scooters. Crutches. Turn on the lights when you go into a dark area. Replace any light bulbs as soon as they burn out. Set up your furniture so you have a clear path. Avoid moving your furniture around. If any of your floors are uneven, fix them. If there are any pets around you, be aware of where they are. Review your medicines with your doctor. Some medicines can make you feel dizzy. This can increase your chance of falling. Ask your doctor what other things that you can do to help prevent falls. This information is not intended to replace advice given to you by your health care provider. Make sure you discuss any questions you have with your health care provider. Document Released: 05/30/2009 Document Revised: 01/09/2016 Document Reviewed: 09/07/2014 Elsevier Interactive Patient Education  2017 [employer].

## 2022-01-16 DIAGNOSIS — H524 Presbyopia: Secondary | ICD-10-CM | POA: Diagnosis not present

## 2022-01-23 DIAGNOSIS — M810 Age-related osteoporosis without current pathological fracture: Secondary | ICD-10-CM | POA: Diagnosis not present

## 2022-01-23 DIAGNOSIS — E559 Vitamin D deficiency, unspecified: Secondary | ICD-10-CM | POA: Diagnosis not present

## 2022-01-30 DIAGNOSIS — S4991XA Unspecified injury of right shoulder and upper arm, initial encounter: Secondary | ICD-10-CM | POA: Diagnosis not present

## 2022-01-30 DIAGNOSIS — S42291A Other displaced fracture of upper end of right humerus, initial encounter for closed fracture: Secondary | ICD-10-CM | POA: Diagnosis not present

## 2022-01-30 DIAGNOSIS — M19011 Primary osteoarthritis, right shoulder: Secondary | ICD-10-CM | POA: Diagnosis not present

## 2022-01-30 DIAGNOSIS — M79601 Pain in right arm: Secondary | ICD-10-CM | POA: Diagnosis not present

## 2022-01-30 DIAGNOSIS — M25511 Pain in right shoulder: Secondary | ICD-10-CM | POA: Diagnosis not present

## 2022-01-30 DIAGNOSIS — W19XXXA Unspecified fall, initial encounter: Secondary | ICD-10-CM | POA: Diagnosis not present

## 2022-01-30 DIAGNOSIS — S42351A Displaced comminuted fracture of shaft of humerus, right arm, initial encounter for closed fracture: Secondary | ICD-10-CM | POA: Diagnosis not present

## 2022-01-30 DIAGNOSIS — S42351D Displaced comminuted fracture of shaft of humerus, right arm, subsequent encounter for fracture with routine healing: Secondary | ICD-10-CM | POA: Diagnosis not present

## 2022-02-02 DIAGNOSIS — S42201A Unspecified fracture of upper end of right humerus, initial encounter for closed fracture: Secondary | ICD-10-CM | POA: Diagnosis not present

## 2022-02-03 DIAGNOSIS — S42201A Unspecified fracture of upper end of right humerus, initial encounter for closed fracture: Secondary | ICD-10-CM | POA: Diagnosis not present

## 2022-02-23 DIAGNOSIS — S42201A Unspecified fracture of upper end of right humerus, initial encounter for closed fracture: Secondary | ICD-10-CM | POA: Diagnosis not present

## 2022-03-03 ENCOUNTER — Encounter: Payer: Self-pay | Admitting: Physical Therapy

## 2022-03-03 ENCOUNTER — Ambulatory Visit: Payer: Medicare HMO | Attending: Orthopaedic Surgery

## 2022-03-03 DIAGNOSIS — M25511 Pain in right shoulder: Secondary | ICD-10-CM | POA: Diagnosis not present

## 2022-03-03 DIAGNOSIS — M6281 Muscle weakness (generalized): Secondary | ICD-10-CM | POA: Diagnosis not present

## 2022-03-03 DIAGNOSIS — S42201D Unspecified fracture of upper end of right humerus, subsequent encounter for fracture with routine healing: Secondary | ICD-10-CM | POA: Diagnosis not present

## 2022-03-03 NOTE — Therapy (Signed)
OUTPATIENT PHYSICAL THERAPY SHOULDER EVALUATION   Patient Name: Jennifer Morrow MRN: [id number] DOB:06/26/1951, 71 y.o., female Today's Date: 03/03/2022   PT End of Session - 03/03/22 1351     Visit Number 1    Number of Visits 25    Date for PT Re-Evaluation 05/26/22    PT Start Time 1300    PT Stop Time 1347    PT Time Calculation (min) 47 min    Activity Tolerance Patient limited by pain    Behavior During Therapy [facility] for tasks assessed/performed             Past Medical History:  Diagnosis Date   Arthritis    Bronchitis    CHRONIC   Bulging lumbar disc    degenerative dics   Scoliosis    LIMITS LUNG FUNCTION   Spinal stenosis    Past Surgical History:  Procedure Laterality Date   CATARACT EXTRACTION W/PHACO Left 01/06/2018   Procedure: CATARACT EXTRACTION PHACO AND INTRAOCULAR LENS PLACEMENT (IOC);  Surgeon: Nevada Crane, MD;  Location: [facility] ORS;  Service: Ophthalmology;  Laterality: Left;  [place] 00:27.5 AP% 4.3 CDE 1.18 FLUID PACK LOT # [id number]   CATARACT EXTRACTION W/PHACO Right 02/10/2018   Procedure: CATARACT EXTRACTION PHACO AND INTRAOCULAR LENS PLACEMENT (IOC);  Surgeon: Nevada Crane, MD;  Location: [facility] ORS;  Service: Ophthalmology;  Laterality: Right;  [place] 00:30.5 AP% 5.0 CDE 1.51  Fluid pack lot # [id number]   FOOT X 2     HEEL SPUR EXCISION     TONSILLECTOMY     Patient Active Problem List   Diagnosis Date Noted   Osteoarthritis cervical spine 01/14/2022   Age-related osteoporosis without current pathological fracture 05/19/2021   Spondylosis of lumbar region without myelopathy or radiculopathy 05/19/2021   Degenerative disc disease, lumbar 05/19/2021   Scoliosis, unspecified 01/09/2015   Vitamin D deficiency 01/08/2014   Mixed hyperlipidemia 01/08/2014    PCP: Bari Edward MD  REFERRING PROVIDER: Ross Marcus MD  REFERRING DIAG: Closed fracture of proximal right humerus   THERAPY DIAG:  Acute pain of right  shoulder  Closed fracture of proximal end of right humerus with routine healing, unspecified fracture morphology, subsequent encounter  Muscle weakness (generalized)  Rationale for Evaluation and Treatment Rehabilitation  ONSET DATE: 01/30/22  SUBJECTIVE:                                                                                                                                                                                      SUBJECTIVE STATEMENT: Pt is s/p R proximal humerus fracture on 01/30/22 for conservative management.   PERTINENT HISTORY: Pt presenting with R shoulder pain  s/p non operative proximal humerus fracture. Pt reports having a tent fall on her leading her to fall to the ground on her R side. Pt here with non-operative PT protocol. Reports orthopedic MD planning for 6-8 weeks of donning sling. Pt has history of R sided RTC tears and orthopedic MD stating return to full AROM in RUE may not be feasible. No MRI for RTC tissue quality as pt reports MD wants to see how pt manages with AROM with fracture recovery. Pt is R handed. Currently receiving HH aide for 4 hours in the morning to assist ADL's primarily dressing. No longer requiring pain meds except Tylenol PRN. Pain vastly has improved, worst pain nowadays is a 4/10 NPS sporadically. Has been working on elbow extension in recliner, some numbness in hand at night palmar and dorsal. Rated as moderate, unsure of time of numbness.   PAIN:  Are you having pain? Yes: NPRS scale: 0/10 Pain location: R shoulder Pain description: Dull/achey Aggravating factors: RUE movement Relieving factors: Rest  PRECAUTIONS: See protocol in chart  WEIGHT BEARING RESTRICTIONS Yes WBAT on RUE  FALLS:  Has patient fallen in last 6 months? No  LIVING ENVIRONMENT: Lives with: lives with their family and lives alone Lives in: House/apartment Stairs: Yes: External: 3 steps; on right going up Has following equipment at home:  None  OCCUPATION: Retired  PLOF: Independent  PATIENT GOALS Improve RUE motion/strength. Return to full independence.    OBJECTIVE:   DIAGNOSTIC FINDINGS:  XR Shoulder 3 Or More Views Right  Final Result   Acute, comminuted and anteriorly displaced fracture of the right proximal humerus with involvement of the surgical and anatomic necks and fragmentation of the greater tuberosity. Large fracture fragment containing the majority of the glenoid articular surface is rotated laterally.   PATIENT SURVEYS:  FOTO 19 with target of 8  COGNITION:  Overall cognitive status: Within functional limits for tasks assessed     SENSATION: WFL  POSTURE: Upper trap activation most likely due to wearing sling. Overall WNL.    CERVICAL AROM:  Flexion: 50 degrees Extension: 45 degrees Rotation R/L: 50/45 degrees Lateral flexion (R/L): 30/35 degrees   UPPER EXTREMITY ROM:   Active ROM Right eval Left eval  Shoulder flexion PROM 74 Full  Shoulder extension    Shoulder abduction PROM 78 Full  Shoulder adduction    Shoulder internal rotation Deferred til week 6 per protocol Full  Shoulder external rotation 16 Full  Elbow flexion 129 Full  Elbow extension 30 from full ext 0 degrees  Wrist flexion    Wrist extension    Wrist ulnar deviation    Wrist radial deviation    Wrist pronation    Wrist supination    (Blank rows = not tested)  UPPER EXTREMITY MMT:    RUE deferred due to NWB status and in sling MMT Right eval Left eval  Shoulder flexion  5/5  Shoulder extension    Shoulder abduction  5/5  Shoulder adduction    Shoulder internal rotation  5/5  Shoulder external rotation  5/5  Middle trapezius    Lower trapezius    Elbow flexion  5/5  Elbow extension  5/5  Wrist flexion  5/5  Wrist extension  5/5  Wrist ulnar deviation    Wrist radial deviation    Wrist pronation    Wrist supination    Grip strength (lbs) 30.1 lbs (avg 2 trials) 36.5 lbs (avg 2 trials)   (Blank rows = not tested)  PALPATION:  TTP along proximal humerus of RUE.    TODAY'S TREATMENT:  Access Code: [email or web address]   PATIENT EDUCATION: Education details: POC, HEP Person educated: Patient Education method: Explanation, Demonstration, Tactile cues, Verbal cues, and Handouts Education comprehension: verbalized understanding, returned demonstration, and needs further education   HOME EXERCISE PROGRAM: Access Code: [email or web address]  ASSESSMENT:  CLINICAL IMPRESSION: Patient is a 71 y.o. female who was seen today for physical therapy evaluation and treatment for conservative management of R proximal humerus fracture. Pt s/p almost 4 weeks with limitations in cervical, elbow AROM and R shoulder PROM.  Wrist and hand AROM WNL on RUE throughout along with supination and pronation of R forearm. Pt is R handed and limited in ability to complete driving tasks, and ADL's such as donning/doffing clothes, home care management and limited in bathing ADL's. Pt will benefit from skilled PT services to address these aforementioned impairments to optimize strength and function of RUE for return to full independence.    OBJECTIVE IMPAIRMENTS decreased mobility, decreased ROM, decreased strength, hypomobility, impaired flexibility, improper body mechanics, postural dysfunction, and pain.   ACTIVITY LIMITATIONS carrying, lifting, bathing, toileting, dressing, reach over head, and hygiene/grooming  PARTICIPATION LIMITATIONS: cleaning, driving, shopping, community activity, occupation, and yard work  PERSONAL FACTORS Age, Behavior pattern, Past/current experiences, Time since onset of injury/illness/exacerbation, Transportation, and 3+ comorbidities: HLD, lumbar DDD, OA of cervical spine, scoliosis  are also affecting patient's functional outcome.   REHAB POTENTIAL: Fair History of R sided RTC tears  CLINICAL DECISION MAKING: Evolving/moderate complexity  EVALUATION COMPLEXITY:  Moderate   GOALS: Goals reviewed with patient? No  SHORT TERM GOALS: Target date: 04/14/2022 unless otherwise stated below  Pt will be independent with HEP to improve R shoulder mobility and strength for ADL completion Baseline: Initiated and given Goal status: INITIAL  2.  Pt will improve R shoulder PROM to 90 degrees per protocol in prep for progression of AAROM of R shoulder. Baseline: 74 degrees Goal status: INITIAL Target date: 03/17/2022   3.  Pt will improve R elbow extension to 0 degrees to prevent elbow contracture for ease of RUE ADL completion.  Baseline: Lacking 30 degrees from terminal extension Goal status: INITIAL Target date: 03/17/2022    LONG TERM GOALS: Target date: 05/26/2022 unless otherwise posted  Pt will display full PROM by 8 weeks to demonstrate full joint mobility Baseline: Limited in flexion, ER, Abduction Goal status: INITIAL Target Date: 04/28/2022  2.  Pt will improve FOTO to target score to demonstrate clinically significant improvement in functional mobility of RUE Baseline: 19 with target score of 55 Goal status: INITIAL  3.  Pt will demonstrate at least 3/5 strength in RUE in flexion and abduction for overhead ADL completion Baseline: unable to test currently Goal status: INITIAL  4.  Pt will demonstrate > 120 degrees of RUE shoulder flexion to demonstrate adequate mobility to complete needed overhead ADL tasks  Baseline: PROM of 74 degrees flexion, 78 PROM abduction Goal status: INITIAL    PLAN: PT FREQUENCY: 2x/week  PT DURATION: 12 weeks  PLANNED INTERVENTIONS: Therapeutic exercises, Therapeutic activity, Neuromuscular re-education, Patient/Family education, Self Care, Joint mobilization, Dry Needling, Electrical stimulation, Spinal mobilization, Cryotherapy, Moist heat, and Manual therapy  PLAN FOR NEXT SESSION: Reassess HEP, R shoulder mobility per protocol   Delphia Grates. Fairly IV, PT, DPT Physical Therapist- [facility]   [facility]  03/03/2022, 2:28 PM

## 2022-03-05 ENCOUNTER — Ambulatory Visit: Payer: Medicare HMO

## 2022-03-05 DIAGNOSIS — M6281 Muscle weakness (generalized): Secondary | ICD-10-CM

## 2022-03-05 DIAGNOSIS — S42201D Unspecified fracture of upper end of right humerus, subsequent encounter for fracture with routine healing: Secondary | ICD-10-CM

## 2022-03-05 DIAGNOSIS — M25511 Pain in right shoulder: Secondary | ICD-10-CM | POA: Diagnosis not present

## 2022-03-05 NOTE — Therapy (Addendum)
OUTPATIENT PHYSICAL THERAPY SHOULDER EVALUATION   Patient Name: Jennifer Morrow MRN: [id number] DOB:1950-12-29, 71 y.o., female Today's Date: 03/05/2022   PT End of Session - 03/05/22 0947     Visit Number 2    Number of Visits 25    Date for PT Re-Evaluation 05/26/22    PT Start Time 0944    PT Stop Time 1032    PT Time Calculation (min) 48 min    Activity Tolerance Patient tolerated treatment well    Behavior During Therapy [facility] for tasks assessed/performed              Past Medical History:  Diagnosis Date   Arthritis    Bronchitis    CHRONIC   Bulging lumbar disc    degenerative dics   Scoliosis    LIMITS LUNG FUNCTION   Spinal stenosis    Past Surgical History:  Procedure Laterality Date   CATARACT EXTRACTION W/PHACO Left 01/06/2018   Procedure: CATARACT EXTRACTION PHACO AND INTRAOCULAR LENS PLACEMENT (IOC);  Surgeon: Nevada Crane, MD;  Location: [facility] ORS;  Service: Ophthalmology;  Laterality: Left;  [place] 00:27.5 AP% 4.3 CDE 1.18 FLUID PACK LOT # [id number]   CATARACT EXTRACTION W/PHACO Right 02/10/2018   Procedure: CATARACT EXTRACTION PHACO AND INTRAOCULAR LENS PLACEMENT (IOC);  Surgeon: Nevada Crane, MD;  Location: [facility] ORS;  Service: Ophthalmology;  Laterality: Right;  [place] 00:30.5 AP% 5.0 CDE 1.51  Fluid pack lot # [id number]   FOOT X 2     HEEL SPUR EXCISION     TONSILLECTOMY     Patient Active Problem List   Diagnosis Date Noted   Osteoarthritis cervical spine 01/14/2022   Age-related osteoporosis without current pathological fracture 05/19/2021   Spondylosis of lumbar region without myelopathy or radiculopathy 05/19/2021   Degenerative disc disease, lumbar 05/19/2021   Scoliosis, unspecified 01/09/2015   Vitamin D deficiency 01/08/2014   Mixed hyperlipidemia 01/08/2014    PCP: Bari Edward MD  REFERRING PROVIDER: Ross Marcus MD  REFERRING DIAG: Closed fracture of proximal right humerus   THERAPY DIAG:  Closed fracture of  proximal end of right humerus with routine healing, unspecified fracture morphology, subsequent encounter  Acute pain of right shoulder  Muscle weakness (generalized)  Rationale for Evaluation and Treatment Rehabilitation  ONSET DATE: 01/30/22  SUBJECTIVE:                                                                                                                                                                                      SUBJECTIVE STATEMENT: Pt is s/p R proximal humerus fracture on 01/30/22 for conservative management. Pt has been doing her HEP, and is able  to roll briefly onto her L shoulder while sleeping. 0/10 pain prior to tx. Pt has pain while dressing herself, and is not able to fully independently don/doff a shirt yet. Pt stated that she has pain in her R low back, with numbness down her LLE and L low back but is chronic pain she has dealt with for years. Pt currently sleeps in adjustable bed, with the ability to raise the head/feet of the bed. Has been compliant with HEP reporting shoulder is moving better with less pain with mobility.   PERTINENT HISTORY: Pt presenting with R shoulder pain s/p non operative proximal humerus fracture. Pt reports having a tent fall on her leading her to fall to the ground on her R side. Pt here with non-operative PT protocol. Reports orthopedic MD planning for 6-8 weeks of donning sling. Pt has history of R sided RTC tears and orthopedic MD stating return to full AROM in RUE may not be feasible. No MRI for RTC tissue quality as pt reports MD wants to see how pt manages with AROM with fracture recovery. Pt is R handed. Currently receiving HH aide for 4 hours in the morning to assist ADL's primarily dressing. No longer requiring pain meds except Tylenol PRN. Pain vastly has improved, worst pain nowadays is a 4/10 NPS sporadically. Has been working on elbow extension in recliner, some numbness in hand at night palmar and dorsal. Rated as moderate,  unsure of time of numbness.   PAIN:  Are you having pain? Yes: NPRS scale: 0/10 Pain location: R shoulder Pain description: Dull/achey Aggravating factors: RUE movement Relieving factors: Rest  PRECAUTIONS: See protocol in chart  WEIGHT BEARING RESTRICTIONS Yes WBAT on RUE  FALLS:  Has patient fallen in last 6 months? No  LIVING ENVIRONMENT: Lives with: lives with their family and lives alone Lives in: House/apartment Stairs: Yes: External: 3 steps; on right going up Has following equipment at home: None  OCCUPATION: Retired  PLOF: Independent  PATIENT GOALS Improve RUE motion/strength. Return to full independence.    OBJECTIVE:   DIAGNOSTIC FINDINGS:  XR Shoulder 3 Or More Views Right  Final Result   Acute, comminuted and anteriorly displaced fracture of the right proximal humerus with involvement of the surgical and anatomic necks and fragmentation of the greater tuberosity. Large fracture fragment containing the majority of the glenoid articular surface is rotated laterally.   PATIENT SURVEYS:  FOTO 19 with target of 17  COGNITION:  Overall cognitive status: Within functional limits for tasks assessed     SENSATION: WFL  POSTURE: Upper trap activation most likely due to wearing sling. Overall WNL.    CERVICAL AROM:  Flexion: 50 degrees Extension: 45 degrees Rotation R/L: 50/45 degrees Lateral flexion (R/L): 30/35 degrees   UPPER EXTREMITY ROM:   Active ROM Right eval Left eval  Shoulder flexion PROM 74 Full  Shoulder extension    Shoulder abduction PROM 78 Full  Shoulder adduction    Shoulder internal rotation Deferred til week 6 per protocol Full  Shoulder external rotation 16 Full  Elbow flexion 129 Full  Elbow extension 30 from full ext 0 degrees  Wrist flexion    Wrist extension    Wrist ulnar deviation    Wrist radial deviation    Wrist pronation    Wrist supination    (Blank rows = not tested)  UPPER EXTREMITY MMT:    RUE  deferred due to NWB status and in sling MMT Right eval Left eval  Shoulder flexion  5/5  Shoulder extension    Shoulder abduction  5/5  Shoulder adduction    Shoulder internal rotation  5/5  Shoulder external rotation  5/5  Middle trapezius    Lower trapezius    Elbow flexion  5/5  Elbow extension  5/5  Wrist flexion  5/5  Wrist extension  5/5  Wrist ulnar deviation    Wrist radial deviation    Wrist pronation    Wrist supination    Grip strength (lbs) 30.1 lbs (avg 2 trials) 36.5 lbs (avg 2 trials)  (Blank rows = not tested)   PALPATION:  TTP along proximal humerus of RUE.    TODAY'S TREATMENT:  03/05/22:  There.ex: Passive Elbow extension / flexion / supinaton / pronation 1x20  in each direction  Passive elbow extension stretch: 2x30 seconds with pronation for bicep stretch/lengthening    R elbow extension measured - 16 deg from terminal elbow extension  Passive shoulder flexion x20 with pain at end range - 2 hand support, one in upper arm and one at forearm  R shoulder flexion 106 deg   Passive shoulder abduction x20 with pain at end range, one in upper arm and one at forearm   R abduction PROM 110 deg   Supine AAROM with wooden pole ER with R shoulder in scapular plane: x20. Limited to ~1-2 degrees from neutral. Education on performing AAROM ER at home with dowel with ability to progress to 30 deg per protocol with shoulder in scapular plane.  Seated shoulder flexion AAROM with R hand rested onto towel across table  x20 in mild scapular plane   Seated shoulder abduction at 45 deg (scaption) with R Hand on pillowcase across blue mat table x25 at 90 deg of shoulder flexion. Inability to perform full shoulder abduction currently. Education to perform in scap plane and progress towards abduction as able.      PATIENT EDUCATION: Education details: form/technique with exercise and updated HEP Person educated: Patient Education method: Explanation, Demonstration,  Tactile cues, Verbal cues, and Handouts Education comprehension: verbalized understanding, returned demonstration, and needs further education   HOME EXERCISE PROGRAM: Access Code: [email or web address]   ASSESSMENT:  CLINICAL IMPRESSION: Pt tolerated increased PROM of R UE well, with increased ROM in shoulder flexion / abduction / elbow extension. Pt had pain in AAROM ER in R UE with wooden dowel in supine position, with limited AAROM. Pt is R handed and limited in ability to complete driving tasks, and ADL's such as donning/doffing clothes, home care management and is limited in bathing ADL's. Pt will benefit from skilled PT services to continue to address her decreased ROM and strength in R UE in order to optimize strength and function of RUE for return to full independence in all activities.    OBJECTIVE IMPAIRMENTS decreased mobility, decreased ROM, decreased strength, hypomobility, impaired flexibility, improper body mechanics, postural dysfunction, and pain.   ACTIVITY LIMITATIONS carrying, lifting, bathing, toileting, dressing, reach over head, and hygiene/grooming  PARTICIPATION LIMITATIONS: cleaning, driving, shopping, community activity, occupation, and yard work  PERSONAL FACTORS Age, Behavior pattern, Past/current experiences, Time since onset of injury/illness/exacerbation, Transportation, and 3+ comorbidities: HLD, lumbar DDD, OA of cervical spine, scoliosis  are also affecting patient's functional outcome.   REHAB POTENTIAL: Fair History of R sided RTC tears  CLINICAL DECISION MAKING: Evolving/moderate complexity  EVALUATION COMPLEXITY: Moderate   GOALS: Goals reviewed with patient? No  SHORT TERM GOALS: Target date: 04/14/2022 unless otherwise stated below  Pt will be independent with HEP to  improve R shoulder mobility and strength for ADL completion Baseline: Initiated and given Goal status: INITIAL  2.  Pt will improve R shoulder PROM to 90 degrees per protocol in prep for  progression of AAROM of R shoulder. Baseline: 74 degrees Goal status: INITIAL Target date: 03/17/2022   3.  Pt will improve R elbow extension to 0 degrees to prevent elbow contracture for ease of RUE ADL completion.  Baseline: Lacking 30 degrees from terminal extension Goal status: INITIAL Target date: 03/17/2022    LONG TERM GOALS: Target date: 05/26/2022 unless otherwise posted  Pt will display full PROM by 8 weeks to demonstrate full joint mobility Baseline: Limited in flexion, ER, Abduction Goal status: INITIAL Target Date: 04/28/2022  2.  Pt will improve FOTO to target score to demonstrate clinically significant improvement in functional mobility of RUE Baseline: 19 with target score of 55 Goal status: INITIAL  3.  Pt will demonstrate at least 3/5 strength in RUE in flexion and abduction for overhead ADL completion Baseline: unable to test currently Goal status: INITIAL  4.  Pt will demonstrate > 120 degrees of RUE shoulder flexion to demonstrate adequate mobility to complete needed overhead ADL tasks  Baseline: PROM of 74 degrees flexion, 78 PROM abduction Goal status: INITIAL    PLAN: PT FREQUENCY: 2x/week  PT DURATION: 12 weeks  PLANNED INTERVENTIONS: Therapeutic exercises, Therapeutic activity, Neuromuscular re-education, Patient/Family education, Self Care, Joint mobilization, Dry Needling, Electrical stimulation, Spinal mobilization, Cryotherapy, Moist heat, and Manual therapy  PLAN FOR NEXT SESSION: Progress R shoulder mobility per protocol - further shoulder and elbow PROM and AAROM. Periscap strengthening, cervical and elbow mobility.    Delphia Grates. Fairly IV, PT, DPT Physical Therapist- [facility]  03/05/2022, 10:50 AM

## 2022-03-09 NOTE — Therapy (Signed)
OUTPATIENT PHYSICAL THERAPY SHOULDER TREATMENT   Patient Name: Jennifer Morrow MRN: [id number] DOB:04/10/1951, 71 y.o., female Today's Date: 03/10/2022   PT End of Session - 03/10/22 1358     Visit Number 3    Number of Visits 25    Date for PT Re-Evaluation 05/26/22    PT Start Time 1401    PT Stop Time 1445    PT Time Calculation (min) 44 min    Activity Tolerance Patient tolerated treatment well    Behavior During Therapy [facility] for tasks assessed/performed               Past Medical History:  Diagnosis Date   Arthritis    Bronchitis    CHRONIC   Bulging lumbar disc    degenerative dics   Scoliosis    LIMITS LUNG FUNCTION   Spinal stenosis    Past Surgical History:  Procedure Laterality Date   CATARACT EXTRACTION W/PHACO Left 01/06/2018   Procedure: CATARACT EXTRACTION PHACO AND INTRAOCULAR LENS PLACEMENT (IOC);  Surgeon: Nevada Crane, MD;  Location: [facility] ORS;  Service: Ophthalmology;  Laterality: Left;  [place] 00:27.5 AP% 4.3 CDE 1.18 FLUID PACK LOT # [id number]   CATARACT EXTRACTION W/PHACO Right 02/10/2018   Procedure: CATARACT EXTRACTION PHACO AND INTRAOCULAR LENS PLACEMENT (IOC);  Surgeon: Nevada Crane, MD;  Location: [facility] ORS;  Service: Ophthalmology;  Laterality: Right;  [place] 00:30.5 AP% 5.0 CDE 1.51  Fluid pack lot # [id number]   FOOT X 2     HEEL SPUR EXCISION     TONSILLECTOMY     Patient Active Problem List   Diagnosis Date Noted   Osteoarthritis cervical spine 01/14/2022   Age-related osteoporosis without current pathological fracture 05/19/2021   Spondylosis of lumbar region without myelopathy or radiculopathy 05/19/2021   Degenerative disc disease, lumbar 05/19/2021   Scoliosis, unspecified 01/09/2015   Vitamin D deficiency 01/08/2014   Mixed hyperlipidemia 01/08/2014    PCP: Bari Edward MD  REFERRING PROVIDER: Ross Marcus MD  REFERRING DIAG: Closed fracture of proximal right humerus   THERAPY DIAG:  Acute pain of right  shoulder  Muscle weakness (generalized)  Rationale for Evaluation and Treatment Rehabilitation  ONSET DATE: 01/30/22  SUBJECTIVE:                                                                                                                                                                                      SUBJECTIVE STATEMENT: Pt is s/p R proximal humerus fracture on 01/30/22 for conservative management. Pt has been doing her HEP, and is able to roll briefly onto her L shoulder while sleeping. 0/10 pain prior to tx. Pt has  pain while dressing herself, and is not able to fully independently don/doff a shirt yet. Pt stated that she has pain in her R low back, with numbness down her LLE and L low back but is chronic pain she has dealt with for years. Pt currently sleeps in adjustable bed, with the ability to raise the head/feet of the bed. Has been compliant with HEP reporting shoulder is moving better with less pain with mobility.   PERTINENT HISTORY: Pt presenting with R shoulder pain s/p non operative proximal humerus fracture. Pt reports having a tent fall on her leading her to fall to the ground on her R side. Pt here with non-operative PT protocol. Reports orthopedic MD planning for 6-8 weeks of donning sling. Pt has history of R sided RTC tears and orthopedic MD stating return to full AROM in RUE may not be feasible. No MRI for RTC tissue quality as pt reports MD wants to see how pt manages with AROM with fracture recovery. Pt is R handed. Currently receiving HH aide for 4 hours in the morning to assist ADL's primarily dressing. No longer requiring pain meds except Tylenol PRN. Pain vastly has improved, worst pain nowadays is a 4/10 NPS sporadically. Has been working on elbow extension in recliner, some numbness in hand at night palmar and dorsal. Rated as moderate, unsure of time of numbness.   PAIN:  Are you having pain? Yes: NPRS scale: 0/10 Pain location: R shoulder Pain description:  Dull/achey Aggravating factors: RUE movement Relieving factors: Rest  PRECAUTIONS: See protocol in chart  WEIGHT BEARING RESTRICTIONS Yes WBAT on RUE  FALLS:  Has patient fallen in last 6 months? No  LIVING ENVIRONMENT: Lives with: lives with their family and lives alone Lives in: House/apartment Stairs: Yes: External: 3 steps; on right going up Has following equipment at home: None  OCCUPATION: Retired  PLOF: Independent  PATIENT GOALS Improve RUE motion/strength. Return to full independence.    OBJECTIVE:   DIAGNOSTIC FINDINGS:  XR Shoulder 3 Or More Views Right  Final Result   Acute, comminuted and anteriorly displaced fracture of the right proximal humerus with involvement of the surgical and anatomic necks and fragmentation of the greater tuberosity. Large fracture fragment containing the majority of the glenoid articular surface is rotated laterally.   PATIENT SURVEYS:  FOTO 19 with target of 61  COGNITION: Overall cognitive status: Within functional limits for tasks assessed     SENSATION: WFL  POSTURE: Upper trap activation most likely due to wearing sling. Overall WNL.    CERVICAL AROM:  Flexion: 50 degrees Extension: 45 degrees Rotation R/L: 50/45 degrees Lateral flexion (R/L): 30/35 degrees   UPPER EXTREMITY ROM:   Active ROM Right eval Left eval  Shoulder flexion PROM 74 Full  Shoulder extension    Shoulder abduction PROM 78 Full  Shoulder adduction    Shoulder internal rotation Deferred til week 6 per protocol Full  Shoulder external rotation 16 Full  Elbow flexion 129 Full  Elbow extension 30 from full ext 0 degrees  Wrist flexion    Wrist extension    Wrist ulnar deviation    Wrist radial deviation    Wrist pronation    Wrist supination    (Blank rows = not tested)  UPPER EXTREMITY MMT:    RUE deferred due to NWB status and in sling MMT Right eval Left eval  Shoulder flexion  5/5  Shoulder extension    Shoulder abduction   5/5  Shoulder adduction  Shoulder internal rotation  5/5  Shoulder external rotation  5/5  Middle trapezius    Lower trapezius    Elbow flexion  5/5  Elbow extension  5/5  Wrist flexion  5/5  Wrist extension  5/5  Wrist ulnar deviation    Wrist radial deviation    Wrist pronation    Wrist supination    Grip strength (lbs) 30.1 lbs (avg 2 trials) 36.5 lbs (avg 2 trials)  (Blank rows = not tested)   PALPATION: TTP along proximal humerus of RUE.      TODAY'S TREATMENT (03/10/22)  SUBJECTIVE: Pt denies pain upon arrival. She took an oxycodone prior to arrival and reports considerable soreness after the last therapy session. No specific questions currently.  PAIN: Denies resting pain upon arrival;   Ther-ex  Moist heat pack applied to R shoulder x 5 minutes prior to exercise during interval history;  Supine passive R elbow extension / flexion / supinaton / pronation x multiple bouts in each direction Supine passive R elbow extension stretch: 2x30 seconds with gentle STM to distal bicep; Supine active R elbow flexion/extension AROM x 10 each; Passive shoulder flexion x 5 minutes with pain at end range, RUE supported with three points of contact; Passive shoulder abduction x 3 minutes with pain at end range, RUE supported with three points of contact; Supine R shoulder ER AAROM with wooden dowel, R shoulder positioned in scapular plane x 10. Limited to ~1-2 degrees from neutral with increase in pain; Attempted R shoulder flexion AAROM with wooden dowel however within the first 10-15 degrees pt reports increase in R shoulder pain and has to stop; Cold pack applied to R shoulder at end of session x 5 minutes (unbilled);      PATIENT EDUCATION: Education details: form/technique with exercise and updated HEP Person educated: Patient Education method: Explanation, Demonstration, Tactile cues, Verbal cues, and Handouts Education comprehension: verbalized understanding, returned  demonstration, and needs further education   HOME EXERCISE PROGRAM: Access Code: [email or web address]   ASSESSMENT:  CLINICAL IMPRESSION: Pt reports considerable soreness after the last therapy. Progressed gentle PROM and AAROM of RUE during session today. She has considerable pain with attempts at [facility] flexion using dowel so discontinued. Pt encouraged to continue table slides at home for flexion and abduction. She will benefit from skilled PT services to continue to address her decreased ROM and strength in RUE in order to optimize strength and function of RUE for return to full independence in all activities.    OBJECTIVE IMPAIRMENTS decreased mobility, decreased ROM, decreased strength, hypomobility, impaired flexibility, improper body mechanics, postural dysfunction, and pain.   ACTIVITY LIMITATIONS carrying, lifting, bathing, toileting, dressing, reach over head, and hygiene/grooming  PARTICIPATION LIMITATIONS: cleaning, driving, shopping, community activity, occupation, and yard work  PERSONAL FACTORS Age, Behavior pattern, Past/current experiences, Time since onset of injury/illness/exacerbation, Transportation, and 3+ comorbidities: HLD, lumbar DDD, OA of cervical spine, scoliosis  are also affecting patient's functional outcome.   REHAB POTENTIAL: Fair History of R sided RTC tears  CLINICAL DECISION MAKING: Evolving/moderate complexity  EVALUATION COMPLEXITY: Moderate   GOALS: Goals reviewed with patient? No  SHORT TERM GOALS: Target date: 04/14/2022 unless otherwise stated below  Pt will be independent with HEP to improve R shoulder mobility and strength for ADL completion Baseline: Initiated and given Goal status: INITIAL  2.  Pt will improve R shoulder PROM to 90 degrees per protocol in prep for progression of AAROM of R shoulder. Baseline: 74 degrees Goal status: INITIAL Target  date: 03/17/2022   3.  Pt will improve R elbow extension to 0 degrees to prevent elbow  contracture for ease of RUE ADL completion.  Baseline: Lacking 30 degrees from terminal extension Goal status: INITIAL Target date: 03/17/2022    LONG TERM GOALS: Target date: 05/26/2022 unless otherwise posted  Pt will display full PROM by 8 weeks to demonstrate full joint mobility Baseline: Limited in flexion, ER, Abduction Goal status: INITIAL Target Date: 04/28/2022  2.  Pt will improve FOTO to target score to demonstrate clinically significant improvement in functional mobility of RUE Baseline: 19 with target score of 55 Goal status: INITIAL  3.  Pt will demonstrate at least 3/5 strength in RUE in flexion and abduction for overhead ADL completion Baseline: unable to test currently Goal status: INITIAL  4.  Pt will demonstrate > 120 degrees of RUE shoulder flexion to demonstrate adequate mobility to complete needed overhead ADL tasks  Baseline: PROM of 74 degrees flexion, 78 PROM abduction Goal status: INITIAL    PLAN: PT FREQUENCY: 2x/week  PT DURATION: 12 weeks  PLANNED INTERVENTIONS: Therapeutic exercises, Therapeutic activity, Neuromuscular re-education, Patient/Family education, Self Care, Joint mobilization, Dry Needling, Electrical stimulation, Spinal mobilization, Cryotherapy, Moist heat, and Manual therapy  PLAN FOR NEXT SESSION: Progress R shoulder mobility per protocol - further shoulder and elbow PROM and AAROM. Periscap strengthening, cervical and elbow mobility.    Lynnea Maizes PT, DPT, GCS  Physical Therapist- [facility]  03/10/2022, 5:34 PM

## 2022-03-10 ENCOUNTER — Ambulatory Visit: Payer: Medicare HMO

## 2022-03-10 DIAGNOSIS — S42201D Unspecified fracture of upper end of right humerus, subsequent encounter for fracture with routine healing: Secondary | ICD-10-CM | POA: Diagnosis not present

## 2022-03-10 DIAGNOSIS — M6281 Muscle weakness (generalized): Secondary | ICD-10-CM

## 2022-03-10 DIAGNOSIS — M25511 Pain in right shoulder: Secondary | ICD-10-CM

## 2022-03-12 ENCOUNTER — Ambulatory Visit: Payer: Medicare HMO | Admitting: Physical Therapy

## 2022-03-12 ENCOUNTER — Encounter: Payer: Self-pay | Admitting: Physical Therapy

## 2022-03-12 DIAGNOSIS — M25511 Pain in right shoulder: Secondary | ICD-10-CM

## 2022-03-12 DIAGNOSIS — M6281 Muscle weakness (generalized): Secondary | ICD-10-CM | POA: Diagnosis not present

## 2022-03-12 DIAGNOSIS — S42201D Unspecified fracture of upper end of right humerus, subsequent encounter for fracture with routine healing: Secondary | ICD-10-CM

## 2022-03-12 NOTE — Patient Instructions (Signed)
Access Code: [email or web address] URL: [email or web address] Date: 03/12/2022 Prepared by: Dorene Grebe  Exercises - Seated Shoulder Flexion AAROM with Pulley Behind  - 2 x daily - 7 x weekly - 2 sets - 10 reps - Seated Shoulder Scaption AAROM with Pulley at Side  - 2 x daily - 7 x weekly - 2 sets - 10 reps - Supine Shoulder Press with Dowel  - 1 x daily - 7 x weekly - 3 sets - 10 reps

## 2022-03-14 NOTE — Therapy (Signed)
OUTPATIENT PHYSICAL THERAPY SHOULDER TREATMENT   Patient Name: Jennifer Morrow MRN: [id number] DOB:[birth year], 71 y.o., female Today's Date: 03/14/2022   PT End of Session - 03/14/22 1010     Visit Number 4    Number of Visits 25    Date for PT Re-Evaluation 05/26/22    PT Start Time 0947    PT Stop Time 1033    PT Time Calculation (min) 46 min    Activity Tolerance Patient tolerated treatment well;Patient limited by pain    Behavior During Therapy [facility] for tasks assessed/performed               Past Medical History:  Diagnosis Date   Arthritis    Bronchitis    CHRONIC   Bulging lumbar disc    degenerative dics   Scoliosis    LIMITS LUNG FUNCTION   Spinal stenosis    Past Surgical History:  Procedure Laterality Date   CATARACT EXTRACTION W/PHACO Left 01/06/2018   Procedure: CATARACT EXTRACTION PHACO AND INTRAOCULAR LENS PLACEMENT (IOC);  Surgeon: Nevada Crane, MD;  Location: [facility] ORS;  Service: Ophthalmology;  Laterality: Left;  [place] 00:27.5 AP% 4.3 CDE 1.18 FLUID PACK LOT # [id number]   CATARACT EXTRACTION W/PHACO Right 02/10/2018   Procedure: CATARACT EXTRACTION PHACO AND INTRAOCULAR LENS PLACEMENT (IOC);  Surgeon: Nevada Crane, MD;  Location: [facility] ORS;  Service: Ophthalmology;  Laterality: Right;  [place] 00:30.5 AP% 5.0 CDE 1.51  Fluid pack lot # [id number]   FOOT X 2     HEEL SPUR EXCISION     TONSILLECTOMY     Patient Active Problem List   Diagnosis Date Noted   Osteoarthritis cervical spine 01/14/2022   Age-related osteoporosis without current pathological fracture 05/19/2021   Spondylosis of lumbar region without myelopathy or radiculopathy 05/19/2021   Degenerative disc disease, lumbar 05/19/2021   Scoliosis, unspecified 01/09/2015   Vitamin D deficiency 01/08/2014   Mixed hyperlipidemia 01/08/2014    PCP: Bari Edward MD  REFERRING PROVIDER: Ross Marcus MD  REFERRING DIAG: Closed fracture of proximal right humerus   THERAPY DIAG:   Acute pain of right shoulder  Muscle weakness (generalized)  Closed fracture of proximal end of right humerus with routine healing, unspecified fracture morphology, subsequent encounter  Rationale for Evaluation and Treatment Rehabilitation  ONSET DATE: 01/30/22  SUBJECTIVE:                                                                                                                                                                                      SUBJECTIVE STATEMENT: Pt is s/p R proximal humerus fracture on 01/30/22 for conservative management. Pt has been doing her  HEP, and is able to roll briefly onto her L shoulder while sleeping. 0/10 pain prior to tx. Pt has pain while dressing herself, and is not able to fully independently don/doff a shirt yet. Pt stated that she has pain in her R low back, with numbness down her LLE and L low back but is chronic pain she has dealt with for years. Pt currently sleeps in adjustable bed, with the ability to raise the head/feet of the bed. Has been compliant with HEP reporting shoulder is moving better with less pain with mobility.   PERTINENT HISTORY: Pt presenting with R shoulder pain s/p non operative proximal humerus fracture. Pt reports having a tent fall on her leading her to fall to the ground on her R side. Pt here with non-operative PT protocol. Reports orthopedic MD planning for 6-8 weeks of donning sling. Pt has history of R sided RTC tears and orthopedic MD stating return to full AROM in RUE may not be feasible. No MRI for RTC tissue quality as pt reports MD wants to see how pt manages with AROM with fracture recovery. Pt is R handed. Currently receiving HH aide for 4 hours in the morning to assist ADL's primarily dressing. No longer requiring pain meds except Tylenol PRN. Pain vastly has improved, worst pain nowadays is a 4/10 NPS sporadically. Has been working on elbow extension in recliner, some numbness in hand at night palmar and dorsal.  Rated as moderate, unsure of time of numbness.   PAIN:  Are you having pain? Yes: NPRS scale: 0/10 Pain location: R shoulder Pain description: Dull/achey Aggravating factors: RUE movement Relieving factors: Rest  PRECAUTIONS: See protocol in chart  WEIGHT BEARING RESTRICTIONS Yes WBAT on RUE  FALLS:  Has patient fallen in last 6 months? No  LIVING ENVIRONMENT: Lives with: lives with their family and lives alone Lives in: House/apartment Stairs: Yes: External: 3 steps; on right going up Has following equipment at home: None  OCCUPATION: Retired  PLOF: [employer] motion/strength. Return to full independence.    OBJECTIVE:   DIAGNOSTIC FINDINGS:  XR Shoulder 3 Or More Views Right  Final Result   Acute, comminuted and anteriorly displaced fracture of the right proximal humerus with involvement of the surgical and anatomic necks and fragmentation of the greater tuberosity. Large fracture fragment containing the majority of the glenoid articular surface is rotated laterally.   PATIENT SURVEYS:  FOTO 19 with target of 22  COGNITION: Overall cognitive status: Within functional limits for tasks assessed     SENSATION: WFL  POSTURE: Upper trap activation most likely due to wearing sling. Overall WNL.    CERVICAL AROM:  Flexion: 50 degrees Extension: 45 degrees Rotation R/L: 50/45 degrees Lateral flexion (R/L): 30/35 degrees   UPPER EXTREMITY ROM:   Active ROM Right eval Left eval  Shoulder flexion PROM 74 Full  Shoulder extension    Shoulder abduction PROM 78 Full  Shoulder adduction    Shoulder internal rotation Deferred til week 6 per protocol Full  Shoulder external rotation 16 Full  Elbow flexion 129 Full  Elbow extension 30 from full ext 0 degrees  Wrist flexion    Wrist extension    Wrist ulnar deviation    Wrist radial deviation    Wrist pronation    Wrist supination    (Blank rows = not tested)  UPPER EXTREMITY  MMT:    RUE deferred due to NWB status and in sling MMT Right eval Left eval  Shoulder flexion  5/5  Shoulder extension    Shoulder abduction  5/5  Shoulder adduction    Shoulder internal rotation  5/5  Shoulder external rotation  5/5  Middle trapezius    Lower trapezius    Elbow flexion  5/5  Elbow extension  5/5  Wrist flexion  5/5  Wrist extension  5/5  Wrist ulnar deviation    Wrist radial deviation    Wrist pronation    Wrist supination    Grip strength (lbs) 30.1 lbs (avg 2 trials) 36.5 lbs (avg 2 trials)  (Blank rows = not tested)   PALPATION: TTP along proximal humerus of RUE.      TODAY'S TREATMENT (03/12/22)  SUBJECTIVE: Pt reports she has no pain prior to tx. Secondary taking Oxy.  Pt. Reports she is doing HEP (table top ex./ AAROM)- but remains pain limited.  Pt. Sore/ hurting after last tx. Session.  Pt. Was busy yesterday with friends.  Pt. Entered PT with use of R shoulder sling/ guarded movement out of sling.  PT reviewed MD protocol.  MD f/u 8/21.  PAIN: Denies resting pain upon arrival;   Ther-ex   Supine passive R elbow extension / flexion / supinaton / pronation x multiple bouts in each direction Supine passive R elbow extension stretch: 2x30 seconds with gentle STM to distal bicep; Supine active R elbow flexion/extension AROM x 10 each; Passive shoulder flexion/ abduction/ ER/ IR x 9 minutes with pain at end range, RUE supported  Supine R shoulder flexion/ chest press AAROM with wand (PT assist for proper technique due to R sh. Pain/ guarded movement).  Shoulder pulley ex. (Flexion/ abduction)- 20x each.   Standing ball ex on table (AAROM sh. Flexion).  Issued for HEP Standing wall ladder (marked with sticker).   Cold pack applied to R shoulder at end of session x 5 minutes (unbilled);      PATIENT EDUCATION: Education details: form/technique with exercise and updated HEP Person educated: Patient Education method: Explanation, Demonstration,  Tactile cues, Verbal cues, and Handouts Education comprehension: verbalized understanding, returned demonstration, and needs further education   HOME EXERCISE PROGRAM: Access Code: [email or web address]  Access Code: [email or web address] URL: [email or web address] Date: 03/12/2022 Prepared by: Dorene Grebe   Exercises - Seated Shoulder Flexion AAROM with Pulley Behind  - 2 x daily - 7 x weekly - 2 sets - 10 reps - Seated Shoulder Scaption AAROM with Pulley at Side  - 2 x daily - 7 x weekly - 2 sets - 10 reps - Supine Shoulder Press with Dowel  - 1 x daily - 7 x weekly - 3 sets - 10 reps   ASSESSMENT:  CLINICAL IMPRESSION: Progressed gentle R shoulder AA/PROM session today. She has considerable pain with attempts at [facility] flexion using dowel but shows improved ROM during pulley ex. Pt encouraged to continue table slides at home for flexion and abduction. She will benefit from skilled PT services to continue to address her decreased ROM and strength in RUE in order to optimize strength and function of RUE for return to full independence in all activities.    OBJECTIVE IMPAIRMENTS decreased mobility, decreased ROM, decreased strength, hypomobility, impaired flexibility, improper body mechanics, postural dysfunction, and pain.   ACTIVITY LIMITATIONS carrying, lifting, bathing, toileting, dressing, reach over head, and hygiene/grooming  PARTICIPATION LIMITATIONS: cleaning, driving, shopping, community activity, occupation, and yard work  PERSONAL FACTORS Age, Behavior pattern, Past/current experiences, Time since onset of injury/illness/exacerbation, Transportation, and 3+ comorbidities: HLD, lumbar DDD, OA of cervical  spine, scoliosis  are also affecting patient's functional outcome.   REHAB POTENTIAL: Fair History of R sided RTC tears  CLINICAL DECISION MAKING: Evolving/moderate complexity  EVALUATION COMPLEXITY: Moderate   GOALS: Goals reviewed with patient? No  SHORT TERM GOALS: Target  date: 04/14/2022 unless otherwise stated below  Pt will be independent with HEP to improve R shoulder mobility and strength for ADL completion Baseline: Initiated and given Goal status: INITIAL  2.  Pt will improve R shoulder PROM to 90 degrees per protocol in prep for progression of AAROM of R shoulder. Baseline: 74 degrees Goal status: INITIAL Target date: 03/17/2022   3.  Pt will improve R elbow extension to 0 degrees to prevent elbow contracture for ease of RUE ADL completion.  Baseline: Lacking 30 degrees from terminal extension Goal status: INITIAL Target date: 03/17/2022    LONG TERM GOALS: Target date: 05/26/2022 unless otherwise posted  Pt will display full PROM by 8 weeks to demonstrate full joint mobility Baseline: Limited in flexion, ER, Abduction Goal status: INITIAL Target Date: 04/28/2022  2.  Pt will improve FOTO to target score to demonstrate clinically significant improvement in functional mobility of RUE Baseline: 19 with target score of 55 Goal status: INITIAL  3.  Pt will demonstrate at least 3/5 strength in RUE in flexion and abduction for overhead ADL completion Baseline: unable to test currently Goal status: INITIAL  4.  Pt will demonstrate > 120 degrees of RUE shoulder flexion to demonstrate adequate mobility to complete needed overhead ADL tasks  Baseline: PROM of 74 degrees flexion, 78 PROM abduction Goal status: INITIAL    PLAN: PT FREQUENCY: 2x/week  PT DURATION: 12 weeks  PLANNED INTERVENTIONS: Therapeutic exercises, Therapeutic activity, Neuromuscular re-education, Patient/Family education, Self Care, Joint mobilization, Dry Needling, Electrical stimulation, Spinal mobilization, Cryotherapy, Moist heat, and Manual therapy  PLAN FOR NEXT SESSION: Progress R shoulder mobility per protocol - further shoulder and elbow PROM and AAROM. Periscap strengthening, cervical and elbow mobility.   Cammie Mcgee, PT, DPT # [phone] Physical Therapist- [facility]  03/14/2022, 10:12 AM

## 2022-03-17 ENCOUNTER — Ambulatory Visit: Payer: Medicare HMO

## 2022-03-17 DIAGNOSIS — M25511 Pain in right shoulder: Secondary | ICD-10-CM

## 2022-03-17 DIAGNOSIS — M6281 Muscle weakness (generalized): Secondary | ICD-10-CM

## 2022-03-19 ENCOUNTER — Ambulatory Visit: Payer: Medicare HMO | Attending: Orthopaedic Surgery | Admitting: Physical Therapy

## 2022-03-19 ENCOUNTER — Encounter: Payer: Self-pay | Admitting: Physical Therapy

## 2022-03-19 DIAGNOSIS — M6281 Muscle weakness (generalized): Secondary | ICD-10-CM | POA: Insufficient documentation

## 2022-03-19 DIAGNOSIS — M25511 Pain in right shoulder: Secondary | ICD-10-CM | POA: Diagnosis not present

## 2022-03-19 DIAGNOSIS — S42201D Unspecified fracture of upper end of right humerus, subsequent encounter for fracture with routine healing: Secondary | ICD-10-CM | POA: Diagnosis not present

## 2022-03-19 NOTE — Therapy (Signed)
OUTPATIENT PHYSICAL THERAPY SHOULDER TREATMENT   Patient Name: Jennifer Morrow MRN: [id number] DOB:27-Dec-1950, 71 y.o., female Today's Date: 03/19/2022   PT End of Session - 03/19/22 0944     Visit Number 5    Number of Visits 25    Date for PT Re-Evaluation 05/26/22    PT Start Time 0944    PT Stop Time 1031    PT Time Calculation (min) 47 min    Activity Tolerance Patient tolerated treatment well;Patient limited by pain    Behavior During Therapy [facility] for tasks assessed/performed               Past Medical History:  Diagnosis Date   Arthritis    Bronchitis    CHRONIC   Bulging lumbar disc    degenerative dics   Scoliosis    LIMITS LUNG FUNCTION   Spinal stenosis    Past Surgical History:  Procedure Laterality Date   CATARACT EXTRACTION W/PHACO Left 01/06/2018   Procedure: CATARACT EXTRACTION PHACO AND INTRAOCULAR LENS PLACEMENT (IOC);  Surgeon: Nevada Crane, MD;  Location: [facility] ORS;  Service: Ophthalmology;  Laterality: Left;  [place] 00:27.5 AP% 4.3 CDE 1.18 FLUID PACK LOT # [id number]   CATARACT EXTRACTION W/PHACO Right 02/10/2018   Procedure: CATARACT EXTRACTION PHACO AND INTRAOCULAR LENS PLACEMENT (IOC);  Surgeon: Nevada Crane, MD;  Location: [facility] ORS;  Service: Ophthalmology;  Laterality: Right;  [place] 00:30.5 AP% 5.0 CDE 1.51  Fluid pack lot # [id number]   FOOT X 2     HEEL SPUR EXCISION     TONSILLECTOMY     Patient Active Problem List   Diagnosis Date Noted   Osteoarthritis cervical spine 01/14/2022   Age-related osteoporosis without current pathological fracture 05/19/2021   Spondylosis of lumbar region without myelopathy or radiculopathy 05/19/2021   Degenerative disc disease, lumbar 05/19/2021   Scoliosis, unspecified 01/09/2015   Vitamin D deficiency 01/08/2014   Mixed hyperlipidemia 01/08/2014    PCP: Bari Edward MD  REFERRING PROVIDER: Ross Marcus MD  REFERRING DIAG: Closed fracture of proximal right humerus   THERAPY DIAG:   Acute pain of right shoulder  Muscle weakness (generalized)  Closed fracture of proximal end of right humerus with routine healing, unspecified fracture morphology, subsequent encounter  Rationale for Evaluation and Treatment Rehabilitation  ONSET DATE: 01/30/22  SUBJECTIVE:                                                                                                                                                                                      SUBJECTIVE STATEMENT: Pt is s/p R proximal humerus fracture on 01/30/22 for conservative management. Pt has been doing her  HEP, and is able to roll briefly onto her L shoulder while sleeping. 0/10 pain prior to tx. Pt has pain while dressing herself, and is not able to fully independently don/doff a shirt yet. Pt stated that she has pain in her R low back, with numbness down her LLE and L low back but is chronic pain she has dealt with for years. Pt currently sleeps in adjustable bed, with the ability to raise the head/feet of the bed. Has been compliant with HEP reporting shoulder is moving better with less pain with mobility.   PERTINENT HISTORY: Pt presenting with R shoulder pain s/p non operative proximal humerus fracture. Pt reports having a tent fall on her leading her to fall to the ground on her R side. Pt here with non-operative PT protocol. Reports orthopedic MD planning for 6-8 weeks of donning sling. Pt has history of R sided RTC tears and orthopedic MD stating return to full AROM in RUE may not be feasible. No MRI for RTC tissue quality as pt reports MD wants to see how pt manages with AROM with fracture recovery. Pt is R handed. Currently receiving HH aide for 4 hours in the morning to assist ADL's primarily dressing. No longer requiring pain meds except Tylenol PRN. Pain vastly has improved, worst pain nowadays is a 4/10 NPS sporadically. Has been working on elbow extension in recliner, some numbness in hand at night palmar and dorsal.  Rated as moderate, unsure of time of numbness.   PAIN:  Are you having pain? Yes: NPRS scale: 0/10 Pain location: R shoulder Pain description: Dull/achey Aggravating factors: RUE movement Relieving factors: Rest  PRECAUTIONS: See protocol in chart  WEIGHT BEARING RESTRICTIONS Yes WBAT on RUE  FALLS:  Has patient fallen in last 6 months? No  LIVING ENVIRONMENT: Lives with: lives with their family and lives alone Lives in: House/apartment Stairs: Yes: External: 3 steps; on right going up Has following equipment at home: None  OCCUPATION: Retired  PLOF: [employer] motion/strength. Return to full independence.    OBJECTIVE:   DIAGNOSTIC FINDINGS:  XR Shoulder 3 Or More Views Right  Final Result   Acute, comminuted and anteriorly displaced fracture of the right proximal humerus with involvement of the surgical and anatomic necks and fragmentation of the greater tuberosity. Large fracture fragment containing the majority of the glenoid articular surface is rotated laterally.   PATIENT SURVEYS:  FOTO 19 with target of 22  COGNITION: Overall cognitive status: Within functional limits for tasks assessed     SENSATION: WFL  POSTURE: Upper trap activation most likely due to wearing sling. Overall WNL.    CERVICAL AROM:  Flexion: 50 degrees Extension: 45 degrees Rotation R/L: 50/45 degrees Lateral flexion (R/L): 30/35 degrees   UPPER EXTREMITY ROM:   Active ROM Right eval Left eval  Shoulder flexion PROM 74 Full  Shoulder extension    Shoulder abduction PROM 78 Full  Shoulder adduction    Shoulder internal rotation Deferred til week 6 per protocol Full  Shoulder external rotation 16 Full  Elbow flexion 129 Full  Elbow extension 30 from full ext 0 degrees  Wrist flexion    Wrist extension    Wrist ulnar deviation    Wrist radial deviation    Wrist pronation    Wrist supination    (Blank rows = not tested)  UPPER EXTREMITY  MMT:    RUE deferred due to NWB status and in sling MMT Right eval Left eval  Shoulder flexion  5/5  Shoulder extension    Shoulder abduction  5/5  Shoulder adduction    Shoulder internal rotation  5/5  Shoulder external rotation  5/5  Middle trapezius    Lower trapezius    Elbow flexion  5/5  Elbow extension  5/5  Wrist flexion  5/5  Wrist extension  5/5  Wrist ulnar deviation    Wrist radial deviation    Wrist pronation    Wrist supination    Grip strength (lbs) 30.1 lbs (avg 2 trials) 36.5 lbs (avg 2 trials)  (Blank rows = not tested)   PALPATION: TTP along proximal humerus of RUE.      TODAY'S TREATMENT (03/19/22)  SUBJECTIVE: Pt reports 3/10 R shoulder pain prior to tx. Pt. Still on pain meds and took about 1 hour ago so she can tolerate PT.  Pt. Had a busy weekend at a wedding in [place] and was really tired Monday and Tuesday.  Pt. States she has neglected HEP due to being out of town and cancelled PT on Tuesday secondary to fatigue.  Pt. States she is not using sling at home.       PAIN: 3/10 prior to tx. Session.  Pt. Arrived to PT with use of sling.     Ther-ex   Standing ball ex on table (AAROM sh. Flexion)- 20x.   Supine R shoulder flexion/ chest press AAROM with wand (PT assist for proper technique due to R sh. Pain/ guarded movement).  Supine passive R elbow extension / flexion / supinaton / pronation x multiple bouts in each direction/ shoulder flexion/ abduction to 90 deg. (pain with end-range flexion).  Supine R shoulder press-ups without wand/ minimal PT assist 10x (moderate fatigue/ pain).  L sidelying R shoulder AAROM abduction/ IR/ ER 10x2.    Standing active R elbow flexion/extension AROM x 10 each with mirror feedback.  Cuing to maintain R UE/elbow in midline.  Standing wall ladder (increase 1 rung with sticker)- 7x pain limited.  Standing pendulum ex. (Circles) at //-bars.   Walking in clinic/ hallway with recip. Arm swing.    Reviewed  HEP  Pt. Will ice at home.       PATIENT EDUCATION: Education details: form/technique with exercise and updated HEP Person educated: Patient Education method: Explanation, Demonstration, Tactile cues, Verbal cues, and Handouts Education comprehension: verbalized understanding, returned demonstration, and needs further education   HOME EXERCISE PROGRAM: Access Code: [email or web address]  Access Code: [email or web address] URL: [email or web address] Date: 03/12/2022 Prepared by: Dorene Grebe   Exercises - Seated Shoulder Flexion AAROM with Pulley Behind  - 2 x daily - 7 x weekly - 2 sets - 10 reps - Seated Shoulder Scaption AAROM with Pulley at Side  - 2 x daily - 7 x weekly - 2 sets - 10 reps - Supine Shoulder Press with Dowel  - 1 x daily - 7 x weekly - 3 sets - 10 reps   ASSESSMENT:  CLINICAL IMPRESSION: Progressed R shoulder AA/PROM to 95 deg. Today.  Pt. Unable to tolerate any ROM >95 deg. Secondary to pain/ muscle guarding.  Pt. Fatigue with reps. And benefits from L UE assist and verbal/tactile cuing to prevent L lateral lean/ R UT compensation.  Improvement noted with R arm swing during gait with less guarding.  Pt encouraged to continue table slides at home for flexion and abduction and pendulum ex.. She will benefit from skilled PT services to continue to address her decreased ROM and strength in RUE in order  to optimize strength and function of RUE for return to full independence in all activities.    OBJECTIVE IMPAIRMENTS decreased mobility, decreased ROM, decreased strength, hypomobility, impaired flexibility, improper body mechanics, postural dysfunction, and pain.   ACTIVITY LIMITATIONS carrying, lifting, bathing, toileting, dressing, reach over head, and hygiene/grooming  PARTICIPATION LIMITATIONS: cleaning, driving, shopping, community activity, occupation, and yard work  PERSONAL FACTORS Age, Behavior pattern, Past/current experiences, Time since onset of  injury/illness/exacerbation, Transportation, and 3+ comorbidities: HLD, lumbar DDD, OA of cervical spine, scoliosis  are also affecting patient's functional outcome.   REHAB POTENTIAL: Fair History of R sided RTC tears  CLINICAL DECISION MAKING: Evolving/moderate complexity  EVALUATION COMPLEXITY: Moderate   GOALS: Goals reviewed with patient? No  SHORT TERM GOALS: Target date: 04/14/2022 unless otherwise stated below  Pt will be independent with HEP to improve R shoulder mobility and strength for ADL completion Baseline: Initiated and given Goal status: INITIAL  2.  Pt will improve R shoulder PROM to 90 degrees per protocol in prep for progression of AAROM of R shoulder. Baseline: 74 degrees Goal status: INITIAL Target date: 03/17/2022   3.  Pt will improve R elbow extension to 0 degrees to prevent elbow contracture for ease of RUE ADL completion.  Baseline: Lacking 30 degrees from terminal extension Goal status: INITIAL Target date: 03/17/2022    LONG TERM GOALS: Target date: 05/26/2022 unless otherwise posted  Pt will display full PROM by 8 weeks to demonstrate full joint mobility Baseline: Limited in flexion, ER, Abduction Goal status: INITIAL Target Date: 04/28/2022  2.  Pt will improve FOTO to target score to demonstrate clinically significant improvement in functional mobility of RUE Baseline: 19 with target score of 55 Goal status: INITIAL  3.  Pt will demonstrate at least 3/5 strength in RUE in flexion and abduction for overhead ADL completion Baseline: unable to test currently Goal status: INITIAL  4.  Pt will demonstrate > 120 degrees of RUE shoulder flexion to demonstrate adequate mobility to complete needed overhead ADL tasks  Baseline: PROM of 74 degrees flexion, 78 PROM abduction Goal status: INITIAL    PLAN: PT FREQUENCY: 2x/week  PT DURATION: 12 weeks  PLANNED INTERVENTIONS: Therapeutic exercises, Therapeutic activity, Neuromuscular re-education,  Patient/Family education, Self Care, Joint mobilization, Dry Needling, Electrical stimulation, Spinal mobilization, Cryotherapy, Moist heat, and Manual therapy  PLAN FOR NEXT SESSION: Progress R shoulder mobility per protocol - further shoulder and elbow PROM and AAROM. Periscap strengthening, cervical and elbow mobility.   Cammie Mcgee, PT, DPT # [id number] Physical Therapist- [facility]  03/19/2022, 1:02 PM

## 2022-03-24 ENCOUNTER — Ambulatory Visit: Payer: Medicare HMO | Admitting: Physical Therapy

## 2022-03-24 ENCOUNTER — Encounter: Payer: Self-pay | Admitting: Physical Therapy

## 2022-03-24 DIAGNOSIS — M6281 Muscle weakness (generalized): Secondary | ICD-10-CM | POA: Diagnosis not present

## 2022-03-24 DIAGNOSIS — M25511 Pain in right shoulder: Secondary | ICD-10-CM

## 2022-03-24 DIAGNOSIS — S42201D Unspecified fracture of upper end of right humerus, subsequent encounter for fracture with routine healing: Secondary | ICD-10-CM

## 2022-03-24 NOTE — Therapy (Signed)
OUTPATIENT PHYSICAL THERAPY SHOULDER TREATMENT   Patient Name: Jennifer Morrow MRN: [id number] DOB:05-07-1951, 71 y.o., female Today's Date: 03/24/2022   PT End of Session - 03/24/22 1352     Visit Number 6    Number of Visits 25    Date for PT Re-Evaluation 05/26/22    PT Start Time 1347    PT Stop Time 1422    PT Time Calculation (min) 35 min    Activity Tolerance Patient tolerated treatment well;Patient limited by pain    Behavior During Therapy [facility] for tasks assessed/performed               Past Medical History:  Diagnosis Date   Arthritis    Bronchitis    CHRONIC   Bulging lumbar disc    degenerative dics   Scoliosis    LIMITS LUNG FUNCTION   Spinal stenosis    Past Surgical History:  Procedure Laterality Date   CATARACT EXTRACTION W/PHACO Left 01/06/2018   Procedure: CATARACT EXTRACTION PHACO AND INTRAOCULAR LENS PLACEMENT ([place]);  Surgeon: Eulogio Bear, MD;  Location: [facility] ORS;  Service: Ophthalmology;  Laterality: Left;  [place] 00:27.5 AP% 4.3 CDE 1.18 FLUID PACK LOT # [id number]   CATARACT EXTRACTION W/PHACO Right 02/10/2018   Procedure: CATARACT EXTRACTION PHACO AND INTRAOCULAR LENS PLACEMENT (IOC);  Surgeon: Eulogio Bear, MD;  Location: [facility] ORS;  Service: Ophthalmology;  Laterality: Right;  [place] 00:30.5 AP% 5.0 CDE 1.51  Fluid pack lot # [id number]   FOOT X 2     HEEL SPUR EXCISION     TONSILLECTOMY     Patient Active Problem List   Diagnosis Date Noted   Osteoarthritis cervical spine 01/14/2022   Age-related osteoporosis without current pathological fracture 05/19/2021   Spondylosis of lumbar region without myelopathy or radiculopathy 05/19/2021   Degenerative disc disease, lumbar 05/19/2021   Scoliosis, unspecified 01/09/2015   Vitamin D deficiency 01/08/2014   Mixed hyperlipidemia 01/08/2014    PCP: Halina Maidens MD  REFERRING PROVIDER: Renee Harder MD  REFERRING DIAG: Closed fracture of proximal right humerus   THERAPY DIAG:   Acute pain of right shoulder  Muscle weakness (generalized)  Closed fracture of proximal end of right humerus with routine healing, unspecified fracture morphology, subsequent encounter  Rationale for Evaluation and Treatment Rehabilitation  ONSET DATE: 01/30/22  SUBJECTIVE:                                                                                                                                                                                      SUBJECTIVE STATEMENT: Pt is s/p R proximal humerus fracture on 01/30/22 for conservative management. Pt has been doing her  HEP, and is able to roll briefly onto her L shoulder while sleeping. 0/10 pain prior to tx. Pt has pain while dressing herself, and is not able to fully independently don/doff a shirt yet. Pt stated that she has pain in her R low back, with numbness down her LLE and L low back but is chronic pain she has dealt with for years. Pt currently sleeps in adjustable bed, with the ability to raise the head/feet of the bed. Has been compliant with HEP reporting shoulder is moving better with less pain with mobility.   PERTINENT HISTORY: Pt presenting with R shoulder pain s/p non operative proximal humerus fracture. Pt reports having a tent fall on her leading her to fall to the ground on her R side. Pt here with non-operative PT protocol. Reports orthopedic MD planning for 6-8 weeks of donning sling. Pt has history of R sided RTC tears and orthopedic MD stating return to full AROM in RUE may not be feasible. No MRI for RTC tissue quality as pt reports MD wants to see how pt manages with AROM with fracture recovery. Pt is R handed. Currently receiving HH aide for 4 hours in the morning to assist ADL's primarily dressing. No longer requiring pain meds except Tylenol PRN. Pain vastly has improved, worst pain nowadays is a 4/10 NPS sporadically. Has been working on elbow extension in recliner, some numbness in hand at night palmar and dorsal.  Rated as moderate, unsure of time of numbness.   PAIN:  Are you having pain? Yes: NPRS scale: 0/10 Pain location: R shoulder Pain description: Dull/achey Aggravating factors: RUE movement Relieving factors: Rest  PRECAUTIONS: See protocol in chart  WEIGHT BEARING RESTRICTIONS Yes WBAT on RUE  FALLS:  Has patient fallen in last 6 months? No  LIVING ENVIRONMENT: Lives with: lives with their family and lives alone Lives in: House/apartment Stairs: Yes: External: 3 steps; on right going up Has following equipment at home: None  OCCUPATION: Retired  PLOF: [employer] motion/strength. Return to full independence.    OBJECTIVE:   DIAGNOSTIC FINDINGS:  XR Shoulder 3 Or More Views Right  Final Result   Acute, comminuted and anteriorly displaced fracture of the right proximal humerus with involvement of the surgical and anatomic necks and fragmentation of the greater tuberosity. Large fracture fragment containing the majority of the glenoid articular surface is rotated laterally.   PATIENT SURVEYS:  FOTO 19 with target of 22  COGNITION: Overall cognitive status: Within functional limits for tasks assessed     SENSATION: WFL  POSTURE: Upper trap activation most likely due to wearing sling. Overall WNL.    CERVICAL AROM:  Flexion: 50 degrees Extension: 45 degrees Rotation R/L: 50/45 degrees Lateral flexion (R/L): 30/35 degrees   UPPER EXTREMITY ROM:   Active ROM Right eval Left eval  Shoulder flexion PROM 74 Full  Shoulder extension    Shoulder abduction PROM 78 Full  Shoulder adduction    Shoulder internal rotation Deferred til week 6 per protocol Full  Shoulder external rotation 16 Full  Elbow flexion 129 Full  Elbow extension 30 from full ext 0 degrees  Wrist flexion    Wrist extension    Wrist ulnar deviation    Wrist radial deviation    Wrist pronation    Wrist supination    (Blank rows = not tested)  UPPER EXTREMITY  MMT:    RUE deferred due to NWB status and in sling MMT Right eval Left eval  Shoulder flexion  5/5  Shoulder extension    Shoulder abduction  5/5  Shoulder adduction    Shoulder internal rotation  5/5  Shoulder external rotation  5/5  Middle trapezius    Lower trapezius    Elbow flexion  5/5  Elbow extension  5/5  Wrist flexion  5/5  Wrist extension  5/5  Wrist ulnar deviation    Wrist radial deviation    Wrist pronation    Wrist supination    Grip strength (lbs) 30.1 lbs (avg 2 trials) 36.5 lbs (avg 2 trials)  (Blank rows = not tested)   PALPATION: TTP along proximal humerus of RUE.      TODAY'S TREATMENT (03/19/22)  SUBJECTIVE: Pt. Went to pool this weekend and did 30 minutes of walking.  Pt. Very tired.       PAIN: 1/10 prior to tx. Session.  Pt. Arrived to PT without use of sling.     Ther-ex.   03/24/22:  Standing ball ex. on table (AAROM sh. Flexion)- 20x.  Standing yellow ball at wall 5x (difficult/ pain).    Seated (in front of mirror): R shoulder flexion/ chest press/ bicep curls AAROM with wand (PT assist for proper technique due to R sh. Pain/ guarded movement).   Seated R shoulder flexion/ scaption/ abduction with wand and max. PT assist 12x each.  Mirror feedback to decrease R UT overcompensation.    Standing wall ladder - 5x pain limited (increased sticker placement 2 rungs) Standing pendulum ex. (Circles) at //-bars.     Supine passive R elbow extension / flexion x 10.  Supine PROM: shoulder flexion/ abduction to 90+ deg. (pain with end-range flexion).   Supine R shoulder press-ups without wand/ minimal PT assist 10x (moderate fatigue/ pain).   Pt. Will ice at home.       PATIENT EDUCATION: Education details: form/technique with exercise and updated HEP Person educated: Patient Education method: Explanation, Demonstration, Tactile cues, Verbal cues, and Handouts Education comprehension: verbalized understanding, returned demonstration, and needs  further education   HOME EXERCISE PROGRAM: Access Code: [email or web address]  Access Code: [email or web address] URL: [email or web address] Date: 03/12/2022 Prepared by: Dorene Grebe   Exercises - Seated Shoulder Flexion AAROM with Pulley Behind  - 2 x daily - 7 x weekly - 2 sets - 10 reps - Seated Shoulder Scaption AAROM with Pulley at Side  - 2 x daily - 7 x weekly - 2 sets - 10 reps - Supine Shoulder Press with Dowel  - 1 x daily - 7 x weekly - 3 sets - 10 reps   ASSESSMENT:  CLINICAL IMPRESSION: R shoulder joint hypomobility with all flexion/ abduction/ ER in seated/supine positions.  Pt. Unable to tolerate any ROM >95 deg. Secondary to pain/ muscle guarding.  Pt. Fatigue with reps. And benefits from PT assist to decrease R UT compensation.  Significant R UT muscle tightness noted with STM.   Pt encouraged to continue pulley/ HEP on a consistent basis.  She will benefit from skilled PT services to continue to address her decreased ROM and strength in RUE in order to optimize strength and function of RUE for return to full independence in all activities.    OBJECTIVE IMPAIRMENTS decreased mobility, decreased ROM, decreased strength, hypomobility, impaired flexibility, improper body mechanics, postural dysfunction, and pain.   ACTIVITY LIMITATIONS carrying, lifting, bathing, toileting, dressing, reach over head, and hygiene/grooming  PARTICIPATION LIMITATIONS: cleaning, driving, shopping, community activity, occupation, and yard work  PERSONAL FACTORS Age, Behavior pattern, Past/current experiences, Time  since onset of injury/illness/exacerbation, Transportation, and 3+ comorbidities: HLD, lumbar DDD, OA of cervical spine, scoliosis  are also affecting patient's functional outcome.   REHAB POTENTIAL: Fair History of R sided RTC tears  CLINICAL DECISION MAKING: Evolving/moderate complexity  EVALUATION COMPLEXITY: Moderate   GOALS: Goals reviewed with patient? No  SHORT TERM GOALS:  Target date: 04/14/2022 unless otherwise stated below  Pt will be independent with HEP to improve R shoulder mobility and strength for ADL completion Baseline: Initiated and given Goal status: INITIAL  2.  Pt will improve R shoulder PROM to 90 degrees per protocol in prep for progression of AAROM of R shoulder. Baseline: 74 degrees Goal status: INITIAL Target date: 03/17/2022   3.  Pt will improve R elbow extension to 0 degrees to prevent elbow contracture for ease of RUE ADL completion.  Baseline: Lacking 30 degrees from terminal extension Goal status: INITIAL Target date: 03/17/2022    LONG TERM GOALS: Target date: 05/26/2022 unless otherwise posted  Pt will display full PROM by 8 weeks to demonstrate full joint mobility Baseline: Limited in flexion, ER, Abduction Goal status: INITIAL Target Date: 04/28/2022  2.  Pt will improve FOTO to target score to demonstrate clinically significant improvement in functional mobility of RUE Baseline: 19 with target score of 55 Goal status: INITIAL  3.  Pt will demonstrate at least 3/5 strength in RUE in flexion and abduction for overhead ADL completion Baseline: unable to test currently Goal status: INITIAL  4.  Pt will demonstrate > 120 degrees of RUE shoulder flexion to demonstrate adequate mobility to complete needed overhead ADL tasks  Baseline: PROM of 74 degrees flexion, 78 PROM abduction Goal status: INITIAL    PLAN: PT FREQUENCY: 2x/week  PT DURATION: 12 weeks  PLANNED INTERVENTIONS: Therapeutic exercises, Therapeutic activity, Neuromuscular re-education, Patient/Family education, Self Care, Joint mobilization, Dry Needling, Electrical stimulation, Spinal mobilization, Cryotherapy, Moist heat, and Manual therapy  PLAN FOR NEXT SESSION: Progress R shoulder mobility per protocol - further shoulder and elbow PROM and AAROM. Periscap strengthening, cervical and elbow mobility.   Cammie Mcgee, PT, DPT # [phone] Physical Therapist-  [facility]  03/24/2022, 2:22 PM

## 2022-03-26 ENCOUNTER — Ambulatory Visit: Payer: Medicare HMO | Admitting: Physical Therapy

## 2022-03-26 ENCOUNTER — Encounter: Payer: Self-pay | Admitting: Physical Therapy

## 2022-03-26 DIAGNOSIS — M6281 Muscle weakness (generalized): Secondary | ICD-10-CM

## 2022-03-26 DIAGNOSIS — S42201D Unspecified fracture of upper end of right humerus, subsequent encounter for fracture with routine healing: Secondary | ICD-10-CM | POA: Diagnosis not present

## 2022-03-26 DIAGNOSIS — M25511 Pain in right shoulder: Secondary | ICD-10-CM

## 2022-03-26 NOTE — Therapy (Signed)
OUTPATIENT PHYSICAL THERAPY SHOULDER TREATMENT   Patient Name: Jennifer Morrow MRN: [id number] DOB:[birth year], 71 y.o., female Today's Date: 03/27/2022   PT End of Session - 03/26/22 0943     Visit Number 7    Number of Visits 25    Date for PT Re-Evaluation 05/26/22    PT Start Time 0943    PT Stop Time 1032    PT Time Calculation (min) 49 min    Activity Tolerance Patient tolerated treatment well;Patient limited by pain    Behavior During Therapy [facility] for tasks assessed/performed               Past Medical History:  Diagnosis Date   Arthritis    Bronchitis    CHRONIC   Bulging lumbar disc    degenerative dics   Scoliosis    LIMITS LUNG FUNCTION   Spinal stenosis    Past Surgical History:  Procedure Laterality Date   CATARACT EXTRACTION W/PHACO Left 01/06/2018   Procedure: CATARACT EXTRACTION PHACO AND INTRAOCULAR LENS PLACEMENT ([place]);  Surgeon: Eulogio Bear, MD;  Location: [facility] ORS;  Service: Ophthalmology;  Laterality: Left;  [place] 00:27.5 AP% 4.3 CDE 1.18 FLUID PACK LOT # [id number]   CATARACT EXTRACTION W/PHACO Right 02/10/2018   Procedure: CATARACT EXTRACTION PHACO AND INTRAOCULAR LENS PLACEMENT (IOC);  Surgeon: Eulogio Bear, MD;  Location: [facility] ORS;  Service: Ophthalmology;  Laterality: Right;  [place] 00:30.5 AP% 5.0 CDE 1.51  Fluid pack lot # [id number]   FOOT X 2     HEEL SPUR EXCISION     TONSILLECTOMY     Patient Active Problem List   Diagnosis Date Noted   Osteoarthritis cervical spine 01/14/2022   Age-related osteoporosis without current pathological fracture 05/19/2021   Spondylosis of lumbar region without myelopathy or radiculopathy 05/19/2021   Degenerative disc disease, lumbar 05/19/2021   Scoliosis, unspecified 01/09/2015   Vitamin D deficiency 01/08/2014   Mixed hyperlipidemia 01/08/2014    PCP: Halina Maidens MD  REFERRING PROVIDER: Renee Harder MD  REFERRING DIAG: Closed fracture of proximal right humerus   THERAPY DIAG:   Acute pain of right shoulder  Muscle weakness (generalized)  Closed fracture of proximal end of right humerus with routine healing, unspecified fracture morphology, subsequent encounter  Rationale for Evaluation and Treatment Rehabilitation  ONSET DATE: 01/30/22  SUBJECTIVE:                                                                                                                                                                                      SUBJECTIVE STATEMENT: Pt is s/p R proximal humerus fracture on 01/30/22 for conservative management. Pt has been doing her  HEP, and is able to roll briefly onto her L shoulder while sleeping. 0/10 pain prior to tx. Pt has pain while dressing herself, and is not able to fully independently don/doff a shirt yet. Pt stated that she has pain in her R low back, with numbness down her LLE and L low back but is chronic pain she has dealt with for years. Pt currently sleeps in adjustable bed, with the ability to raise the head/feet of the bed. Has been compliant with HEP reporting shoulder is moving better with less pain with mobility.   PERTINENT HISTORY: Pt presenting with R shoulder pain s/p non operative proximal humerus fracture. Pt reports having a tent fall on her leading her to fall to the ground on her R side. Pt here with non-operative PT protocol. Reports orthopedic MD planning for 6-8 weeks of donning sling. Pt has history of R sided RTC tears and orthopedic MD stating return to full AROM in RUE may not be feasible. No MRI for RTC tissue quality as pt reports MD wants to see how pt manages with AROM with fracture recovery. Pt is R handed. Currently receiving HH aide for 4 hours in the morning to assist ADL's primarily dressing. No longer requiring pain meds except Tylenol PRN. Pain vastly has improved, worst pain nowadays is a 4/10 NPS sporadically. Has been working on elbow extension in recliner, some numbness in hand at night palmar and dorsal.  Rated as moderate, unsure of time of numbness.   PAIN:  Are you having pain? Yes: NPRS scale: 0/10 Pain location: R shoulder Pain description: Dull/achey Aggravating factors: RUE movement Relieving factors: Rest  PRECAUTIONS: See protocol in chart  WEIGHT BEARING RESTRICTIONS Yes WBAT on RUE  FALLS:  Has patient fallen in last 6 months? No  LIVING ENVIRONMENT: Lives with: lives with their family and lives alone Lives in: House/apartment Stairs: Yes: External: 3 steps; on right going up Has following equipment at home: None  OCCUPATION: Retired  PLOF: [employer] motion/strength. Return to full independence.    OBJECTIVE:   DIAGNOSTIC FINDINGS:  XR Shoulder 3 Or More Views Right  Final Result   Acute, comminuted and anteriorly displaced fracture of the right proximal humerus with involvement of the surgical and anatomic necks and fragmentation of the greater tuberosity. Large fracture fragment containing the majority of the glenoid articular surface is rotated laterally.   PATIENT SURVEYS:  FOTO 19 with target of 22  COGNITION: Overall cognitive status: Within functional limits for tasks assessed     SENSATION: WFL  POSTURE: Upper trap activation most likely due to wearing sling. Overall WNL.    CERVICAL AROM:  Flexion: 50 degrees Extension: 45 degrees Rotation R/L: 50/45 degrees Lateral flexion (R/L): 30/35 degrees   UPPER EXTREMITY ROM:   Active ROM Right eval Left eval  Shoulder flexion PROM 74 Full  Shoulder extension    Shoulder abduction PROM 78 Full  Shoulder adduction    Shoulder internal rotation Deferred til week 6 per protocol Full  Shoulder external rotation 16 Full  Elbow flexion 129 Full  Elbow extension 30 from full ext 0 degrees  Wrist flexion    Wrist extension    Wrist ulnar deviation    Wrist radial deviation    Wrist pronation    Wrist supination    (Blank rows = not tested)  UPPER EXTREMITY  MMT:    RUE deferred due to NWB status and in sling MMT Right eval Left eval  Shoulder flexion  5/5  Shoulder extension    Shoulder abduction  5/5  Shoulder adduction    Shoulder internal rotation  5/5  Shoulder external rotation  5/5  Middle trapezius    Lower trapezius    Elbow flexion  5/5  Elbow extension  5/5  Wrist flexion  5/5  Wrist extension  5/5  Wrist ulnar deviation    Wrist radial deviation    Wrist pronation    Wrist supination    Grip strength (lbs) 30.1 lbs (avg 2 trials) 36.5 lbs (avg 2 trials)  (Blank rows = not tested)   PALPATION: TTP along proximal humerus of RUE.      TODAY'S TREATMENT (03/26/22)  SUBJECTIVE: Pt. Did not take an opiod prior to PT tx. Today.  Pt. Took a Tylenol and is planning to return to pool tomorrow.        PAIN: 1/10 prior to tx. Session.  Pt. Arrived to PT without use of sling.     Ther-ex.   03/26/22:  Seated shoulder pulley (flexion/ scaption/ abduction)- 20x.    Standing ball ex. on handrail (AAROM sh. Flexion progressing to R UE only)- 20x.    Seated (in front of mirror): R shoulder flexion/ chest press/ bicep curls AAROM with wand (PT assist for proper technique due to R sh. Pain/ guarded movement).   Seated R shoulder flexion/ scaption/ abduction with wand and max. PT assist 20x each.  Mirror feedback to decrease R UT overcompensation.    Nustep B UE/LE L1-3 10 min.  Cuing to increase R elbow extension/ use R UE more than L.    Standing wall ladder - 5x pain limited (increased sticker placement 2 rungs) Standing pendulum ex. (Circles) at //-bars.     Supine R shoulder press-ups without wand/ minimal PT assist 10x (moderate fatigue/ pain).   Manual tx.:  Supine passive R elbow extension / flexion x 10.  Supine PROM: shoulder flexion/ abduction to 90+ deg. (pain with end-range flexion).   Pt. Will ice at home.       PATIENT EDUCATION: Education details: form/technique with exercise and updated HEP Person  educated: Patient Education method: Explanation, Demonstration, Tactile cues, Verbal cues, and Handouts Education comprehension: verbalized understanding, returned demonstration, and needs further education   HOME EXERCISE PROGRAM: Access Code: [email or web address]  Access Code: [email or web address] URL: [email or web address] Date: 03/12/2022 Prepared by: Dorcas Carrow   Exercises - Seated Shoulder Flexion AAROM with Pulley Behind  - 2 x daily - 7 x weekly - 2 sets - 10 reps - Seated Shoulder Scaption AAROM with Pulley at Side  - 2 x daily - 7 x weekly - 2 sets - 10 reps - Supine Shoulder Press with Dowel  - 1 x daily - 7 x weekly - 3 sets - 10 reps   ASSESSMENT:  CLINICAL IMPRESSION: R shoulder joint hypomobility with all flexion/ abduction/ ER in seated/supine positions.  Pt. Unable to tolerate any ROM >95 deg. Secondary to pain/ muscle guarding.  Pt. Fatigue with reps. And benefits from PT assist to decrease R UT compensation.  Significant R UT muscle tightness noted with STM.   Pt encouraged to continue pulley/ HEP on a consistent basis.  She will benefit from skilled PT services to continue to address her decreased ROM and strength in RUE in order to optimize strength and function of RUE for return to full independence in all activities.    OBJECTIVE IMPAIRMENTS decreased mobility, decreased ROM, decreased strength, hypomobility, impaired flexibility, improper body  mechanics, postural dysfunction, and pain.   ACTIVITY LIMITATIONS carrying, lifting, bathing, toileting, dressing, reach over head, and hygiene/grooming  PARTICIPATION LIMITATIONS: cleaning, driving, shopping, community activity, occupation, and yard work  PERSONAL FACTORS Age, Behavior pattern, Past/current experiences, Time since onset of injury/illness/exacerbation, Transportation, and 3+ comorbidities: HLD, lumbar DDD, OA of cervical spine, scoliosis  are also affecting patient's functional outcome.   REHAB POTENTIAL: Fair  History of R sided RTC tears  CLINICAL DECISION MAKING: Evolving/moderate complexity  EVALUATION COMPLEXITY: Moderate   GOALS: Goals reviewed with patient? No  SHORT TERM GOALS: Target date: 04/14/2022 unless otherwise stated below  Pt will be independent with HEP to improve R shoulder mobility and strength for ADL completion Baseline: Initiated and given Goal status: Partially met  2.  Pt will improve R shoulder PROM to 90 degrees per protocol in prep for progression of AAROM of R shoulder. Baseline: 74 degrees.  R shoulder 95 deg.  Goal status: Goal met Target date: 04/14/2022   3.  Pt will improve R elbow extension to 0 degrees to prevent elbow contracture for ease of RUE ADL completion.  Baseline: Lacking 30 degrees from terminal extension Goal status: Partially met Target date: 04/14/2022    LONG TERM GOALS: Target date: 05/26/2022 unless otherwise posted  Pt will display full PROM by 8 weeks to demonstrate full joint mobility Baseline: Limited in flexion, ER, Abduction Goal status: INITIAL Target Date: 04/28/2022  2.  Pt will improve FOTO to target score to demonstrate clinically significant improvement in functional mobility of RUE Baseline: 19 with target score of 55 Goal status: INITIAL  3.  Pt will demonstrate at least 3/5 strength in RUE in flexion and abduction for overhead ADL completion Baseline: unable to test currently Goal status: INITIAL  4.  Pt will demonstrate > 120 degrees of RUE shoulder flexion to demonstrate adequate mobility to complete needed overhead ADL tasks  Baseline: PROM of 74 degrees flexion, 78 PROM abduction Goal status: INITIAL    PLAN: PT FREQUENCY: 2x/week  PT DURATION: 12 weeks  PLANNED INTERVENTIONS: Therapeutic exercises, Therapeutic activity, Neuromuscular re-education, Patient/Family education, Self Care, Joint mobilization, Dry Needling, Electrical stimulation, Spinal mobilization, Cryotherapy, Moist heat, and Manual  therapy  PLAN FOR NEXT SESSION: Progress R shoulder mobility per protocol - further shoulder and elbow PROM and AAROM. Periscap strengthening, cervical and elbow mobility.   Pura Spice, PT, DPT # [phone] Physical Therapist- [facility]  03/27/2022, 10:19 AM

## 2022-03-31 ENCOUNTER — Ambulatory Visit: Payer: Medicare HMO | Admitting: Physical Therapy

## 2022-03-31 DIAGNOSIS — M25511 Pain in right shoulder: Secondary | ICD-10-CM

## 2022-03-31 DIAGNOSIS — S42201D Unspecified fracture of upper end of right humerus, subsequent encounter for fracture with routine healing: Secondary | ICD-10-CM | POA: Diagnosis not present

## 2022-03-31 DIAGNOSIS — M6281 Muscle weakness (generalized): Secondary | ICD-10-CM

## 2022-03-31 NOTE — Therapy (Deleted)
OUTPATIENT PHYSICAL THERAPY SHOULDER TREATMENT   Patient Name: Jennifer Morrow MRN: [id number] DOB:12-20-1950, 71 y.o., female Today's Date: 03/31/2022   PT End of Session - 03/31/22 1122     Visit Number 8    Number of Visits 25    Date for PT Re-Evaluation 05/26/22    PT Start Time 1118    Activity Tolerance Patient tolerated treatment well;Patient limited by pain    Behavior During Therapy [facility] for tasks assessed/performed               Past Medical History:  Diagnosis Date   Arthritis    Bronchitis    CHRONIC   Bulging lumbar disc    degenerative dics   Scoliosis    LIMITS LUNG FUNCTION   Spinal stenosis    Past Surgical History:  Procedure Laterality Date   CATARACT EXTRACTION W/PHACO Left 01/06/2018   Procedure: CATARACT EXTRACTION PHACO AND INTRAOCULAR LENS PLACEMENT ([place]);  Surgeon: Eulogio Bear, MD;  Location: [facility] ORS;  Service: Ophthalmology;  Laterality: Left;  [place] 00:27.5 AP% 4.3 CDE 1.18 FLUID PACK LOT # [id number]   CATARACT EXTRACTION W/PHACO Right 02/10/2018   Procedure: CATARACT EXTRACTION PHACO AND INTRAOCULAR LENS PLACEMENT ([facility]);  Surgeon: Eulogio Bear, MD;  Location: [facility] ORS;  Service: Ophthalmology;  Laterality: Right;  [place] 00:30.5 AP% 5.0 CDE 1.51  Fluid pack lot # [id number]   FOOT X 2     HEEL SPUR EXCISION     TONSILLECTOMY     Patient Active Problem List   Diagnosis Date Noted   Osteoarthritis cervical spine 01/14/2022   Age-related osteoporosis without current pathological fracture 05/19/2021   Spondylosis of lumbar region without myelopathy or radiculopathy 05/19/2021   Degenerative disc disease, lumbar 05/19/2021   Scoliosis, unspecified 01/09/2015   Vitamin D deficiency 01/08/2014   Mixed hyperlipidemia 01/08/2014    PCP: Halina Maidens MD  REFERRING PROVIDER: Renee Harder MD  REFERRING DIAG: Closed fracture of proximal right humerus   THERAPY DIAG:  No diagnosis found.  Rationale for Evaluation and  Treatment Rehabilitation  ONSET DATE: 01/30/22  SUBJECTIVE:                                                                                                                                                                                      SUBJECTIVE STATEMENT: Pt is s/p R proximal humerus fracture on 01/30/22 for conservative management. Pt has been doing her HEP, and is able to roll briefly onto her L shoulder while sleeping. 0/10 pain prior to tx. Pt has pain while dressing herself, and is not able to fully independently don/doff a shirt yet. Pt stated that she  has pain in her R low back, with numbness down her LLE and L low back but is chronic pain she has dealt with for years. Pt currently sleeps in adjustable bed, with the ability to raise the head/feet of the bed. Has been compliant with HEP reporting shoulder is moving better with less pain with mobility.   PERTINENT HISTORY: Pt presenting with R shoulder pain s/p non operative proximal humerus fracture. Pt reports having a tent fall on her leading her to fall to the ground on her R side. Pt here with non-operative PT protocol. Reports orthopedic MD planning for 6-8 weeks of donning sling. Pt has history of R sided RTC tears and orthopedic MD stating return to full AROM in RUE may not be feasible. No MRI for RTC tissue quality as pt reports MD wants to see how pt manages with AROM with fracture recovery. Pt is R handed. Currently receiving HH aide for 4 hours in the morning to assist ADL's primarily dressing. No longer requiring pain meds except Tylenol PRN. Pain vastly has improved, worst pain nowadays is a 4/10 NPS sporadically. Has been working on elbow extension in recliner, some numbness in hand at night palmar and dorsal. Rated as moderate, unsure of time of numbness.   PAIN:  Are you having pain? Yes: NPRS scale: 0/10 Pain location: R shoulder Pain description: Dull/achey Aggravating factors: RUE movement Relieving factors:  Rest  PRECAUTIONS: See protocol in chart  WEIGHT BEARING RESTRICTIONS Yes WBAT on RUE  FALLS:  Has patient fallen in last 6 months? No  LIVING ENVIRONMENT: Lives with: lives with their family and lives alone Lives in: House/apartment Stairs: Yes: External: 3 steps; on right going up Has following equipment at home: None  OCCUPATION: Retired  PLOF: [place] motion/strength. Return to full independence.    OBJECTIVE:   DIAGNOSTIC FINDINGS:  XR Shoulder 3 Or More Views Right  Final Result   Acute, comminuted and anteriorly displaced fracture of the right proximal humerus with involvement of the surgical and anatomic necks and fragmentation of the greater tuberosity. Large fracture fragment containing the majority of the glenoid articular surface is rotated laterally.   PATIENT SURVEYS:  FOTO 19 with target of 11  COGNITION: Overall cognitive status: Within functional limits for tasks assessed     SENSATION: WFL  POSTURE: Upper trap activation most likely due to wearing sling. Overall WNL.    CERVICAL AROM:  Flexion: 50 degrees Extension: 45 degrees Rotation R/L: 50/45 degrees Lateral flexion (R/L): 30/35 degrees   UPPER EXTREMITY ROM:   Active ROM Right eval Left eval  Shoulder flexion PROM 74 Full  Shoulder extension    Shoulder abduction PROM 78 Full  Shoulder adduction    Shoulder internal rotation Deferred til week 6 per protocol Full  Shoulder external rotation 16 Full  Elbow flexion 129 Full  Elbow extension 30 from full ext 0 degrees  Wrist flexion    Wrist extension    Wrist ulnar deviation    Wrist radial deviation    Wrist pronation    Wrist supination    (Blank rows = not tested)  UPPER EXTREMITY MMT:    RUE deferred due to NWB status and in sling MMT Right eval Left eval  Shoulder flexion  5/5  Shoulder extension    Shoulder abduction  5/5  Shoulder adduction    Shoulder internal rotation  5/5   Shoulder external rotation  5/5  Middle trapezius    Lower  trapezius    Elbow flexion  5/5  Elbow extension  5/5  Wrist flexion  5/5  Wrist extension  5/5  Wrist ulnar deviation    Wrist radial deviation    Wrist pronation    Wrist supination    Grip strength (lbs) 30.1 lbs (avg 2 trials) 36.5 lbs (avg 2 trials)  (Blank rows = not tested)   PALPATION: TTP along proximal humerus of RUE.      TODAY'S TREATMENT (03/26/22)  SUBJECTIVE: Pt. Did not take an opiod prior to PT tx. Today.  Pt. Took a Tylenol and is planning to return to pool tomorrow.        PAIN: 1/10 prior to tx. Session.  Pt. Arrived to PT without use of sling.     Ther-ex.   03/26/22:  Seated shoulder pulley (flexion/ scaption/ abduction)- 20x.    Standing ball ex. on handrail (AAROM sh. Flexion progressing to R UE only)- 20x.    Seated (in front of mirror): R shoulder flexion/ chest press/ bicep curls AAROM with wand (PT assist for proper technique due to R sh. Pain/ guarded movement).   Seated R shoulder flexion/ scaption/ abduction with wand and max. PT assist 20x each.  Mirror feedback to decrease R UT overcompensation.    Nustep B UE/LE L1-3 10 min.  Cuing to increase R elbow extension/ use R UE more than L.    Standing wall ladder - 5x pain limited (increased sticker placement 2 rungs) Standing pendulum ex. (Circles) at //-bars.     Supine R shoulder press-ups without wand/ minimal PT assist 10x (moderate fatigue/ pain).   Manual tx.:  Supine passive R elbow extension / flexion x 10.  Supine PROM: shoulder flexion/ abduction to 90+ deg. (pain with end-range flexion).   Pt. Will ice at home.       PATIENT EDUCATION: Education details: form/technique with exercise and updated HEP Person educated: Patient Education method: Explanation, Demonstration, Tactile cues, Verbal cues, and Handouts Education comprehension: verbalized understanding, returned demonstration, and needs further  education   HOME EXERCISE PROGRAM: Access Code: [email or web address]  Access Code: [email or web address] URL: [email or web address] Date: 03/12/2022 Prepared by: Dorcas Carrow   Exercises - Seated Shoulder Flexion AAROM with Pulley Behind  - 2 x daily - 7 x weekly - 2 sets - 10 reps - Seated Shoulder Scaption AAROM with Pulley at Side  - 2 x daily - 7 x weekly - 2 sets - 10 reps - Supine Shoulder Press with Dowel  - 1 x daily - 7 x weekly - 3 sets - 10 reps   ASSESSMENT:  CLINICAL IMPRESSION: R shoulder joint hypomobility with all flexion/ abduction/ ER in seated/supine positions.  Pt. Unable to tolerate any ROM >95 deg. Secondary to pain/ muscle guarding.  Pt. Fatigue with reps. And benefits from PT assist to decrease R UT compensation.  Significant R UT muscle tightness noted with STM.   Pt encouraged to continue pulley/ HEP on a consistent basis.  She will benefit from skilled PT services to continue to address her decreased ROM and strength in RUE in order to optimize strength and function of RUE for return to full independence in all activities.    OBJECTIVE IMPAIRMENTS decreased mobility, decreased ROM, decreased strength, hypomobility, impaired flexibility, improper body mechanics, postural dysfunction, and pain.   ACTIVITY LIMITATIONS carrying, lifting, bathing, toileting, dressing, reach over head, and hygiene/grooming  PARTICIPATION LIMITATIONS: cleaning, driving, shopping, community activity, occupation, and yard work  PERSONAL FACTORS Age, Behavior pattern, Past/current  experiences, Time since onset of injury/illness/exacerbation, Transportation, and 3+ comorbidities: HLD, lumbar DDD, OA of cervical spine, scoliosis  are also affecting patient's functional outcome.   REHAB POTENTIAL: Fair History of R sided RTC tears  CLINICAL DECISION MAKING: Evolving/moderate complexity  EVALUATION COMPLEXITY: Moderate   GOALS: Goals reviewed with patient? No  SHORT TERM GOALS: Target  date: 04/14/2022 unless otherwise stated below  Pt will be independent with HEP to improve R shoulder mobility and strength for ADL completion Baseline: Initiated and given Goal status: Partially met  2.  Pt will improve R shoulder PROM to 90 degrees per protocol in prep for progression of AAROM of R shoulder. Baseline: 74 degrees.  R shoulder 95 deg.  Goal status: Goal met Target date: 04/14/2022   3.  Pt will improve R elbow extension to 0 degrees to prevent elbow contracture for ease of RUE ADL completion.  Baseline: Lacking 30 degrees from terminal extension Goal status: Partially met Target date: 04/14/2022    LONG TERM GOALS: Target date: 05/26/2022 unless otherwise posted  Pt will display full PROM by 8 weeks to demonstrate full joint mobility Baseline: Limited in flexion, ER, Abduction Goal status: INITIAL Target Date: 04/28/2022  2.  Pt will improve FOTO to target score to demonstrate clinically significant improvement in functional mobility of RUE Baseline: 19 with target score of 55 Goal status: INITIAL  3.  Pt will demonstrate at least 3/5 strength in RUE in flexion and abduction for overhead ADL completion Baseline: unable to test currently Goal status: INITIAL  4.  Pt will demonstrate > 120 degrees of RUE shoulder flexion to demonstrate adequate mobility to complete needed overhead ADL tasks  Baseline: PROM of 74 degrees flexion, 78 PROM abduction Goal status: INITIAL    PLAN: PT FREQUENCY: 2x/week  PT DURATION: 12 weeks  PLANNED INTERVENTIONS: Therapeutic exercises, Therapeutic activity, Neuromuscular re-education, Patient/Family education, Self Care, Joint mobilization, Dry Needling, Electrical stimulation, Spinal mobilization, Cryotherapy, Moist heat, and Manual therapy  PLAN FOR NEXT SESSION: Progress R shoulder mobility per protocol - further shoulder and elbow PROM and AAROM. Periscap strengthening, cervical and elbow mobility.   Pura Spice, PT,  DPT # [id number] Physical Therapist- [facility]  03/31/2022, 11:22 AM

## 2022-03-31 NOTE — Therapy (Signed)
OUTPATIENT PHYSICAL THERAPY SHOULDER TREATMENT   Patient Name: Jennifer Morrow MRN: [id number] DOB:[birth year], 71 y.o., female Today's Date: 03/31/2022   PT End of Session - 03/31/22 1122     Visit Number 8    Number of Visits 25    Date for PT Re-Evaluation 05/26/22    PT Start Time 1118    PT Stop Time 1200    PT Time Calculation (min) 42 min    Activity Tolerance Patient tolerated treatment well;Patient limited by pain    Behavior During Therapy [facility] for tasks assessed/performed             Past Medical History:  Diagnosis Date   Arthritis    Bronchitis    CHRONIC   Bulging lumbar disc    degenerative dics   Scoliosis    LIMITS LUNG FUNCTION   Spinal stenosis    Past Surgical History:  Procedure Laterality Date   CATARACT EXTRACTION W/PHACO Left 01/06/2018   Procedure: CATARACT EXTRACTION PHACO AND INTRAOCULAR LENS PLACEMENT ([place]);  Surgeon: Eulogio Bear, MD;  Location: [facility] ORS;  Service: Ophthalmology;  Laterality: Left;  [place] 00:27.5 AP% 4.3 CDE 1.18 FLUID PACK LOT # [id number]   CATARACT EXTRACTION W/PHACO Right 02/10/2018   Procedure: CATARACT EXTRACTION PHACO AND INTRAOCULAR LENS PLACEMENT ([facility]);  Surgeon: Eulogio Bear, MD;  Location: [facility] ORS;  Service: Ophthalmology;  Laterality: Right;  [place] 00:30.5 AP% 5.0 CDE 1.51  Fluid pack lot # [id number]   FOOT X 2     HEEL SPUR EXCISION     TONSILLECTOMY     Patient Active Problem List   Diagnosis Date Noted   Osteoarthritis cervical spine 01/14/2022   Age-related osteoporosis without current pathological fracture 05/19/2021   Spondylosis of lumbar region without myelopathy or radiculopathy 05/19/2021   Degenerative disc disease, lumbar 05/19/2021   Scoliosis, unspecified 01/09/2015   Vitamin D deficiency 01/08/2014   Mixed hyperlipidemia 01/08/2014    PCP: Halina Maidens MD  REFERRING PROVIDER: Renee Harder MD  REFERRING DIAG: Closed fracture of proximal right humerus   THERAPY DIAG:   Acute pain of right shoulder  Muscle weakness (generalized)  Closed fracture of proximal end of right humerus with routine healing, unspecified fracture morphology, subsequent encounter  Rationale for Evaluation and Treatment Rehabilitation  ONSET DATE: 01/30/22  SUBJECTIVE:                                                                                                                                                                                      SUBJECTIVE STATEMENT: Pt is s/p R proximal humerus fracture on 01/30/22 for conservative management. Pt has been doing her HEP, and  is able to roll briefly onto her L shoulder while sleeping. 0/10 pain prior to tx. Pt has pain while dressing herself, and is not able to fully independently don/doff a shirt yet. Pt stated that she has pain in her R low back, with numbness down her LLE and L low back but is chronic pain she has dealt with for years. Pt currently sleeps in adjustable bed, with the ability to raise the head/feet of the bed. Has been compliant with HEP reporting shoulder is moving better with less pain with mobility.   PERTINENT HISTORY: Pt presenting with R shoulder pain s/p non operative proximal humerus fracture. Pt reports having a tent fall on her leading her to fall to the ground on her R side. Pt here with non-operative PT protocol. Reports orthopedic MD planning for 6-8 weeks of donning sling. Pt has history of R sided RTC tears and orthopedic MD stating return to full AROM in RUE may not be feasible. No MRI for RTC tissue quality as pt reports MD wants to see how pt manages with AROM with fracture recovery. Pt is R handed. Currently receiving HH aide for 4 hours in the morning to assist ADL's primarily dressing. No longer requiring pain meds except Tylenol PRN. Pain vastly has improved, worst pain nowadays is a 4/10 NPS sporadically. Has been working on elbow extension in recliner, some numbness in hand at night palmar and dorsal.  Rated as moderate, unsure of time of numbness.   PAIN:  Are you having pain? Yes: NPRS scale: 0/10 Pain location: R shoulder Pain description: Dull/achey Aggravating factors: RUE movement Relieving factors: Rest  PRECAUTIONS: See protocol in chart  WEIGHT BEARING RESTRICTIONS Yes WBAT on RUE  FALLS:  Has patient fallen in last 6 months? No  LIVING ENVIRONMENT: Lives with: lives with their family and lives alone Lives in: House/apartment Stairs: Yes: External: 3 steps; on right going up Has following equipment at home: None  OCCUPATION: Retired  PLOF: [place] motion/strength. Return to full independence.    OBJECTIVE:   DIAGNOSTIC FINDINGS:  XR Shoulder 3 Or More Views Right  Final Result   Acute, comminuted and anteriorly displaced fracture of the right proximal humerus with involvement of the surgical and anatomic necks and fragmentation of the greater tuberosity. Large fracture fragment containing the majority of the glenoid articular surface is rotated laterally.   PATIENT SURVEYS:  FOTO 19 with target of 55  COGNITION: Overall cognitive status: Within functional limits for tasks assessed     SENSATION: WFL  POSTURE: Upper trap activation most likely due to wearing sling. Overall WNL.    CERVICAL AROM:  Flexion: 50 degrees Extension: 45 degrees Rotation R/L: 50/45 degrees Lateral flexion (R/L): 30/35 degrees   UPPER EXTREMITY ROM:   Active ROM Right eval Left eval  Shoulder flexion PROM 74 Full  Shoulder extension    Shoulder abduction PROM 78 Full  Shoulder adduction    Shoulder internal rotation Deferred til week 6 per protocol Full  Shoulder external rotation 16 Full  Elbow flexion 129 Full  Elbow extension 30 from full ext 0 degrees  Wrist flexion    Wrist extension    Wrist ulnar deviation    Wrist radial deviation    Wrist pronation    Wrist supination    (Blank rows = not tested)  UPPER EXTREMITY  MMT:    RUE deferred due to NWB status and in sling MMT Right eval Left eval  Shoulder  flexion  5/5  Shoulder extension    Shoulder abduction  5/5  Shoulder adduction    Shoulder internal rotation  5/5  Shoulder external rotation  5/5  Middle trapezius    Lower trapezius    Elbow flexion  5/5  Elbow extension  5/5  Wrist flexion  5/5  Wrist extension  5/5  Wrist ulnar deviation    Wrist radial deviation    Wrist pronation    Wrist supination    Grip strength (lbs) 30.1 lbs (avg 2 trials) 36.5 lbs (avg 2 trials)  (Blank rows = not tested)   PALPATION: TTP along proximal humerus of RUE.      TODAY'S TREATMENT (03/31/22)  SUBJECTIVE: Pt. Reports no new complaints.  Pt. Has not returned to driving at this time.  Pt. Has MD f/u after next PT tx. Session.        PAIN: 1/10 prior to tx. Session.  Pt. Arrived to PT without use of sling.     Ther-ex.   03/31/22:  B UBE 2.5 minutes forward/backwards.  Warm-up/ discussed weekend activities.    Standing (in front of mirror): B shoulder AAROM with wand:  shoulder flexion/ chest press/ shoulder extension/ IR AAROM (PT assist for proper technique due to R sh. Pain/ guarded movement).     Seated R shoulder flexion/ scaption/ abduction with wand and PT assist 20x each in front of Nautilus machine  Nautilus: seated lat. Pull downs with static shoulder flexion stretches 10x (30#).  Pain tolerable range.  Supine R shoulder press-ups without wand/ minimal PT assist 10x (moderate fatigue/ pain).   Supine R shoulder IR/ER/bicep flexion/ extension manual isometrics 5x each (min. Resistance).    Manual tx.:  Supine passive R elbow extension / flexion x 10.  Supine PROM: shoulder flexion/ abduction to 90+ deg. (pain with end-range flexion).   STM to R shoulder/ UT/ biceps musculature in seated/supine position.    Pt. Will ice at home.       PATIENT EDUCATION: Education details: form/technique with exercise and updated HEP Person  educated: Patient Education method: Explanation, Demonstration, Tactile cues, Verbal cues, and Handouts Education comprehension: verbalized understanding, returned demonstration, and needs further education   HOME EXERCISE PROGRAM: Access Code: [email or web address]  Access Code: [email or web address] URL: [email or web address] Date: 03/12/2022 Prepared by: Dorcas Carrow   Exercises - Seated Shoulder Flexion AAROM with Pulley Behind  - 2 x daily - 7 x weekly - 2 sets - 10 reps - Seated Shoulder Scaption AAROM with Pulley at Side  - 2 x daily - 7 x weekly - 2 sets - 10 reps - Supine Shoulder Press with Dowel  - 1 x daily - 7 x weekly - 3 sets - 10 reps   ASSESSMENT:  CLINICAL IMPRESSION: R shoulder joint hypomobility with all flexion/ abduction/ ER in seated/supine positions.  Pt. Unable to tolerate any ROM >95 deg. Secondary to pain/ muscle guarding.  Pt. Fatigue with reps. And benefits from PT assist to decrease R UT compensation.  Significant R UT muscle tightness noted with STM.   Pt encouraged to continue pulley/ HEP on a consistent basis.  She will benefit from skilled PT services to continue to address her decreased ROM and strength in RUE in order to optimize strength and function of RUE for return to full independence in all activities.    OBJECTIVE IMPAIRMENTS decreased mobility, decreased ROM, decreased strength, hypomobility, impaired flexibility, improper body mechanics, postural dysfunction, and pain.   ACTIVITY LIMITATIONS carrying, lifting, bathing, toileting,  dressing, reach over head, and hygiene/grooming  PARTICIPATION LIMITATIONS: cleaning, driving, shopping, community activity, occupation, and yard work  PERSONAL FACTORS Age, Behavior pattern, Past/current experiences, Time since onset of injury/illness/exacerbation, Transportation, and 3+ comorbidities: HLD, lumbar DDD, OA of cervical spine, scoliosis  are also affecting patient's functional outcome.   REHAB POTENTIAL: Fair  History of R sided RTC tears  CLINICAL DECISION MAKING: Evolving/moderate complexity  EVALUATION COMPLEXITY: Moderate   GOALS: Goals reviewed with patient? No  SHORT TERM GOALS: Target date: 04/14/2022 unless otherwise stated below  Pt will be independent with HEP to improve R shoulder mobility and strength for ADL completion Baseline: Initiated and given Goal status: Partially met  2.  Pt will improve R shoulder PROM to 90 degrees per protocol in prep for progression of AAROM of R shoulder. Baseline: 74 degrees.  R shoulder 95 deg.  Goal status: Goal met Target date: 04/14/2022   3.  Pt will improve R elbow extension to 0 degrees to prevent elbow contracture for ease of RUE ADL completion.  Baseline: Lacking 30 degrees from terminal extension Goal status: Partially met Target date: 04/14/2022    LONG TERM GOALS: Target date: 05/26/2022 unless otherwise posted  Pt will display full PROM by 8 weeks to demonstrate full joint mobility Baseline: Limited in flexion, ER, Abduction Goal status: INITIAL Target Date: 04/28/2022  2.  Pt will improve FOTO to target score to demonstrate clinically significant improvement in functional mobility of RUE Baseline: 19 with target score of 55 Goal status: INITIAL  3.  Pt will demonstrate at least 3/5 strength in RUE in flexion and abduction for overhead ADL completion Baseline: unable to test currently Goal status: INITIAL  4.  Pt will demonstrate > 120 degrees of RUE shoulder flexion to demonstrate adequate mobility to complete needed overhead ADL tasks  Baseline: PROM of 74 degrees flexion, 78 PROM abduction Goal status: INITIAL    PLAN: PT FREQUENCY: 2x/week  PT DURATION: 12 weeks  PLANNED INTERVENTIONS: Therapeutic exercises, Therapeutic activity, Neuromuscular re-education, Patient/Family education, Self Care, Joint mobilization, Dry Needling, Electrical stimulation, Spinal mobilization, Cryotherapy, Moist heat, and Manual  therapy  PLAN FOR NEXT SESSION: Progress R shoulder mobility per protocol - further shoulder and elbow PROM and AAROM. Periscap strengthening, cervical and elbow mobility.   SEND MD PROGRESS NOTE.    Pura Spice, PT, DPT # [id number] Physical Therapist- [facility]  03/31/2022, 12:26 PM

## 2022-04-02 ENCOUNTER — Ambulatory Visit: Payer: Medicare HMO | Admitting: Physical Therapy

## 2022-04-02 DIAGNOSIS — M25511 Pain in right shoulder: Secondary | ICD-10-CM | POA: Diagnosis not present

## 2022-04-02 DIAGNOSIS — S42201D Unspecified fracture of upper end of right humerus, subsequent encounter for fracture with routine healing: Secondary | ICD-10-CM

## 2022-04-02 DIAGNOSIS — M6281 Muscle weakness (generalized): Secondary | ICD-10-CM

## 2022-04-02 NOTE — Therapy (Signed)
OUTPATIENT PHYSICAL THERAPY SHOULDER TREATMENT   Patient Name: Jennifer Morrow MRN: [id number] DOB:1951-08-02, 71 y.o., female Today's Date: 04/02/2022   PT End of Session - 04/02/22 0940     Visit Number 9    Number of Visits 25    Date for PT Re-Evaluation 05/26/22    PT Start Time 0940    PT Stop Time 1026    PT Time Calculation (min) 46 min    Activity Tolerance Patient tolerated treatment well;Patient limited by pain    Behavior During Therapy [facility] for tasks assessed/performed             Past Medical History:  Diagnosis Date   Arthritis    Bronchitis    CHRONIC   Bulging lumbar disc    degenerative dics   Scoliosis    LIMITS LUNG FUNCTION   Spinal stenosis    Past Surgical History:  Procedure Laterality Date   CATARACT EXTRACTION W/PHACO Left 01/06/2018   Procedure: CATARACT EXTRACTION PHACO AND INTRAOCULAR LENS PLACEMENT ([place]);  Surgeon: Eulogio Bear, MD;  Location: [facility] ORS;  Service: Ophthalmology;  Laterality: Left;  [place] 00:27.5 AP% 4.3 CDE 1.18 FLUID PACK LOT # [id number]   CATARACT EXTRACTION W/PHACO Right 02/10/2018   Procedure: CATARACT EXTRACTION PHACO AND INTRAOCULAR LENS PLACEMENT ([facility]);  Surgeon: Eulogio Bear, MD;  Location: [facility] ORS;  Service: Ophthalmology;  Laterality: Right;  [place] 00:30.5 AP% 5.0 CDE 1.51  Fluid pack lot # [id number]   FOOT X 2     HEEL SPUR EXCISION     TONSILLECTOMY     Patient Active Problem List   Diagnosis Date Noted   Osteoarthritis cervical spine 01/14/2022   Age-related osteoporosis without current pathological fracture 05/19/2021   Spondylosis of lumbar region without myelopathy or radiculopathy 05/19/2021   Degenerative disc disease, lumbar 05/19/2021   Scoliosis, unspecified 01/09/2015   Vitamin D deficiency 01/08/2014   Mixed hyperlipidemia 01/08/2014    PCP: Halina Maidens MD  REFERRING PROVIDER: Renee Harder MD  REFERRING DIAG: Closed fracture of proximal right humerus   THERAPY DIAG:   Acute pain of right shoulder  Muscle weakness (generalized)  Closed fracture of proximal end of right humerus with routine healing, unspecified fracture morphology, subsequent encounter  Rationale for Evaluation and Treatment Rehabilitation  ONSET DATE: 01/30/22  SUBJECTIVE:                                                                                                                                                                                      SUBJECTIVE STATEMENT: Pt is s/p R proximal humerus fracture on 01/30/22 for conservative management. Pt has been doing her HEP, and  is able to roll briefly onto her L shoulder while sleeping. 0/10 pain prior to tx. Pt has pain while dressing herself, and is not able to fully independently don/doff a shirt yet. Pt stated that she has pain in her R low back, with numbness down her LLE and L low back but is chronic pain she has dealt with for years. Pt currently sleeps in adjustable bed, with the ability to raise the head/feet of the bed. Has been compliant with HEP reporting shoulder is moving better with less pain with mobility.   PERTINENT HISTORY: Pt presenting with R shoulder pain s/p non operative proximal humerus fracture. Pt reports having a tent fall on her leading her to fall to the ground on her R side. Pt here with non-operative PT protocol. Reports orthopedic MD planning for 6-8 weeks of donning sling. Pt has history of R sided RTC tears and orthopedic MD stating return to full AROM in RUE may not be feasible. No MRI for RTC tissue quality as pt reports MD wants to see how pt manages with AROM with fracture recovery. Pt is R handed. Currently receiving HH aide for 4 hours in the morning to assist ADL's primarily dressing. No longer requiring pain meds except Tylenol PRN. Pain vastly has improved, worst pain nowadays is a 4/10 NPS sporadically. Has been working on elbow extension in recliner, some numbness in hand at night palmar and dorsal.  Rated as moderate, unsure of time of numbness.   PAIN:  Are you having pain? Yes: NPRS scale: 0/10 Pain location: R shoulder Pain description: Dull/achey Aggravating factors: RUE movement Relieving factors: Rest  PRECAUTIONS: See protocol in chart  WEIGHT BEARING RESTRICTIONS Yes WBAT on RUE  FALLS:  Has patient fallen in last 6 months? No  LIVING ENVIRONMENT: Lives with: lives with their family and lives alone Lives in: House/apartment Stairs: Yes: External: 3 steps; on right going up Has following equipment at home: None  OCCUPATION: Retired  PLOF: [place] motion/strength. Return to full independence.    OBJECTIVE:   DIAGNOSTIC FINDINGS:  XR Shoulder 3 Or More Views Right  Final Result   Acute, comminuted and anteriorly displaced fracture of the right proximal humerus with involvement of the surgical and anatomic necks and fragmentation of the greater tuberosity. Large fracture fragment containing the majority of the glenoid articular surface is rotated laterally.   PATIENT SURVEYS:  FOTO 19 with target of 55  COGNITION: Overall cognitive status: Within functional limits for tasks assessed     SENSATION: WFL  POSTURE: Upper trap activation most likely due to wearing sling. Overall WNL.    CERVICAL AROM:  Flexion: 50 degrees Extension: 45 degrees Rotation R/L: 50/45 degrees Lateral flexion (R/L): 30/35 degrees   UPPER EXTREMITY ROM:   Active ROM Right eval Left eval  Shoulder flexion PROM 74 Full  Shoulder extension    Shoulder abduction PROM 78 Full  Shoulder adduction    Shoulder internal rotation Deferred til week 6 per protocol Full  Shoulder external rotation 16 Full  Elbow flexion 129 Full  Elbow extension 30 from full ext 0 degrees  Wrist flexion    Wrist extension    Wrist ulnar deviation    Wrist radial deviation    Wrist pronation    Wrist supination    (Blank rows = not tested)  UPPER EXTREMITY  MMT:    RUE deferred due to NWB status and in sling MMT Right eval Left eval  Shoulder  flexion  5/5  Shoulder extension    Shoulder abduction  5/5  Shoulder adduction    Shoulder internal rotation  5/5  Shoulder external rotation  5/5  Middle trapezius    Lower trapezius    Elbow flexion  5/5  Elbow extension  5/5  Wrist flexion  5/5  Wrist extension  5/5  Wrist ulnar deviation    Wrist radial deviation    Wrist pronation    Wrist supination    Grip strength (lbs) 30.1 lbs (avg 2 trials) 36.5 lbs (avg 2 trials)  (Blank rows = not tested)   PALPATION: TTP along proximal humerus of RUE.      TODAY'S TREATMENT (04/02/22)  SUBJECTIVE: Pt. Reports doing more activity at home because brother is visiting.  Pt. Reports 3/10 R shoulder discomfort/ pain this morning (took Tylenol).  Pt. Was able to peel/cut up vegetables in kitchen.       PAIN: 1-3/10 prior to tx. Session.  Pt. Arrived to PT without use of sling.  Pts. Friend drove her to PT.     Ther-ex.   04/02/22:  Seated shoulder pulley (flexion/ scaption/ abduction)- 20x each with holds.    Standing shoulder flexion AAROM with green theraball at stair railing 12x.    B UBE 3 minutes forward/backwards.  Warm-up/ discussed weekend activities.    Standing (in front of mirror): B shoulder AAROM with wand:  shoulder flexion/ chest press/ shoulder extension/ IR AAROM (PT assist for proper technique due to R sh. Pain/ guarded movement).  Nautilus: seated lat. Pull downs with static shoulder flexion stretches 10x (30#).  Pain tolerable range.  R shoulder A/PROM in supine: flexion (94 deg.), abduction (92 deg.), ER (33 deg.), IR (78 deg.).  Supine R shoulder PROM flexion 124 deg. (Pain)   Standing R shoulder AROM: flexion (85 deg.), abduction (82 deg.).      Seated R shoulder flexion/ scaption/ abduction with wand and PT assist 20x each in front of Nautilus machine  Supine R shoulder IR/ER/bicep flexion/ extension manual  isometrics 5x each (min. Resistance).    Manual tx.:  Supine passive R elbow extension / flexion x 10.  Supine PROM: shoulder flexion/ abduction to 90+ deg. (pain with end-range flexion).   STM to R shoulder/ UT/ biceps musculature in seated/supine position.    Pt. Will ice at home.       PATIENT EDUCATION: Education details: form/technique with exercise and updated HEP Person educated: Patient Education method: Explanation, Demonstration, Tactile cues, Verbal cues, and Handouts Education comprehension: verbalized understanding, returned demonstration, and needs further education   HOME EXERCISE PROGRAM: Access Code: [email or web address]  Access Code: [email or web address] URL: [email or web address] Date: 03/12/2022 Prepared by: Dorcas Carrow   Exercises - Seated Shoulder Flexion AAROM with Pulley Behind  - 2 x daily - 7 x weekly - 2 sets - 10 reps - Seated Shoulder Scaption AAROM with Pulley at Side  - 2 x daily - 7 x weekly - 2 sets - 10 reps - Supine Shoulder Press with Dowel  - 1 x daily - 7 x weekly - 3 sets - 10 reps   ASSESSMENT:  CLINICAL IMPRESSION: R shoulder joint hypomobility with all flexion/ abduction/ ER in seated/supine positions.  Pt. Limited to 124 deg. R shoulder PROM in supine position secondary to pain.  Pt. Fatigue with reps. And benefits from PT assist to decrease R UT compensation.  Significant R UT muscle tightness noted with STM.   Pt encouraged to continue pulley/ HEP on a  consistent basis.  She will benefit from skilled PT services to continue to address her decreased ROM and strength in RUE in order to optimize strength and function of RUE for return to full independence in all activities.    OBJECTIVE IMPAIRMENTS decreased mobility, decreased ROM, decreased strength, hypomobility, impaired flexibility, improper body mechanics, postural dysfunction, and pain.   ACTIVITY LIMITATIONS carrying, lifting, bathing, toileting, dressing, reach over head, and  hygiene/grooming  PARTICIPATION LIMITATIONS: cleaning, driving, shopping, community activity, occupation, and yard work  PERSONAL FACTORS Age, Behavior pattern, Past/current experiences, Time since onset of injury/illness/exacerbation, Transportation, and 3+ comorbidities: HLD, lumbar DDD, OA of cervical spine, scoliosis  are also affecting patient's functional outcome.   REHAB POTENTIAL: Fair History of R sided RTC tears  CLINICAL DECISION MAKING: Evolving/moderate complexity  EVALUATION COMPLEXITY: Moderate   GOALS: Goals reviewed with patient? No  SHORT TERM GOALS: Target date: 04/14/2022 unless otherwise stated below  Pt will be independent with HEP to improve R shoulder mobility and strength for ADL completion Baseline: Initiated and given Goal status: Partially met  2.  Pt will improve R shoulder PROM to 90 degrees per protocol in prep for progression of AAROM of R shoulder. Baseline: 74 degrees.  R shoulder 95 deg.  Goal status: Goal met Target date: 04/14/2022   3.  Pt will improve R elbow extension to 0 degrees to prevent elbow contracture for ease of RUE ADL completion.  Baseline: Lacking 30 degrees from terminal extension Goal status: Partially met Target date: 04/14/2022    LONG TERM GOALS: Target date: 05/26/2022 unless otherwise posted  Pt will display full PROM by 8 weeks to demonstrate full joint mobility Baseline: Limited in flexion, ER, Abduction Goal status: INITIAL Target Date: 04/28/2022  2.  Pt will improve FOTO to target score to demonstrate clinically significant improvement in functional mobility of RUE Baseline: 19 with target score of 55 Goal status: INITIAL  3.  Pt will demonstrate at least 3/5 strength in RUE in flexion and abduction for overhead ADL completion Baseline: unable to test currently Goal status: INITIAL  4.  Pt will demonstrate > 120 degrees of RUE shoulder flexion to demonstrate adequate mobility to complete needed overhead ADL  tasks  Baseline: PROM of 74 degrees flexion, 78 PROM abduction Goal status: INITIAL    PLAN: PT FREQUENCY: 2x/week  PT DURATION: 12 weeks  PLANNED INTERVENTIONS: Therapeutic exercises, Therapeutic activity, Neuromuscular re-education, Patient/Family education, Self Care, Joint mobilization, Dry Needling, Electrical stimulation, Spinal mobilization, Cryotherapy, Moist heat, and Manual therapy  PLAN FOR NEXT SESSION: Progress R shoulder mobility per protocol - further shoulder and elbow PROM and AAROM. Periscap strengthening, cervical and elbow mobility.  Discuss MD f/u.   Pura Spice, PT, DPT # [id number] Physical Therapist- [facility]  04/02/2022, 10:27 AM

## 2022-04-06 DIAGNOSIS — S42201A Unspecified fracture of upper end of right humerus, initial encounter for closed fracture: Secondary | ICD-10-CM | POA: Diagnosis not present

## 2022-04-07 ENCOUNTER — Ambulatory Visit: Payer: Medicare HMO | Admitting: Physical Therapy

## 2022-04-07 ENCOUNTER — Encounter: Payer: Self-pay | Admitting: Physical Therapy

## 2022-04-07 DIAGNOSIS — M6281 Muscle weakness (generalized): Secondary | ICD-10-CM

## 2022-04-07 DIAGNOSIS — S42201D Unspecified fracture of upper end of right humerus, subsequent encounter for fracture with routine healing: Secondary | ICD-10-CM | POA: Diagnosis not present

## 2022-04-07 DIAGNOSIS — M25511 Pain in right shoulder: Secondary | ICD-10-CM

## 2022-04-07 NOTE — Therapy (Signed)
OUTPATIENT PHYSICAL THERAPY SHOULDER TREATMENT Physical Therapy Progress Note   Dates of reporting period  03/03/22  to  04/07/22   Patient Name: Jennifer Morrow MRN: [id number] DOB:Apr 25, 1951, 71 y.o., female Today's Date: 04/07/2022   PT End of Session - 04/07/22 1033     Visit Number 10    Number of Visits 25    Date for PT Re-Evaluation 05/26/22    PT Start Time 1038    PT Stop Time 1119    PT Time Calculation (min) 41 min    Activity Tolerance Patient tolerated treatment well;Patient limited by pain    Behavior During Therapy [facility] for tasks assessed/performed             Past Medical History:  Diagnosis Date   Arthritis    Bronchitis    CHRONIC   Bulging lumbar disc    degenerative dics   Scoliosis    LIMITS LUNG FUNCTION   Spinal stenosis    Past Surgical History:  Procedure Laterality Date   CATARACT EXTRACTION W/PHACO Left 01/06/2018   Procedure: CATARACT EXTRACTION PHACO AND INTRAOCULAR LENS PLACEMENT ([place]);  Surgeon: Eulogio Bear, MD;  Location: [facility] ORS;  Service: Ophthalmology;  Laterality: Left;  [place] 00:27.5 AP% 4.3 CDE 1.18 FLUID PACK LOT # [id number]   CATARACT EXTRACTION W/PHACO Right 02/10/2018   Procedure: CATARACT EXTRACTION PHACO AND INTRAOCULAR LENS PLACEMENT ([facility]);  Surgeon: Eulogio Bear, MD;  Location: [facility] ORS;  Service: Ophthalmology;  Laterality: Right;  [place] 00:30.5 AP% 5.0 CDE 1.51  Fluid pack lot # [id number]   FOOT X 2     HEEL SPUR EXCISION     TONSILLECTOMY     Patient Active Problem List   Diagnosis Date Noted   Osteoarthritis cervical spine 01/14/2022   Age-related osteoporosis without current pathological fracture 05/19/2021   Spondylosis of lumbar region without myelopathy or radiculopathy 05/19/2021   Degenerative disc disease, lumbar 05/19/2021   Scoliosis, unspecified 01/09/2015   Vitamin D deficiency 01/08/2014   Mixed hyperlipidemia 01/08/2014    PCP: Halina Maidens MD  REFERRING PROVIDER: Renee Harder  MD  REFERRING DIAG: Closed fracture of proximal right humerus   THERAPY DIAG:  Acute pain of right shoulder  Muscle weakness (generalized)  Closed fracture of proximal end of right humerus with routine healing, unspecified fracture morphology, subsequent encounter  Rationale for Evaluation and Treatment Rehabilitation  ONSET DATE: 01/30/22  SUBJECTIVE:                                                                                                                                                                                      SUBJECTIVE STATEMENT: Pt is s/p  R proximal humerus fracture on 01/30/22 for conservative management. Pt has been doing her HEP, and is able to roll briefly onto her L shoulder while sleeping. 0/10 pain prior to tx. Pt has pain while dressing herself, and is not able to fully independently don/doff a shirt yet. Pt stated that she has pain in her R low back, with numbness down her LLE and L low back but is chronic pain she has dealt with for years. Pt currently sleeps in adjustable bed, with the ability to raise the head/feet of the bed. Has been compliant with HEP reporting shoulder is moving better with less pain with mobility.   PERTINENT HISTORY: Pt presenting with R shoulder pain s/p non operative proximal humerus fracture. Pt reports having a tent fall on her leading her to fall to the ground on her R side. Pt here with non-operative PT protocol. Reports orthopedic MD planning for 6-8 weeks of donning sling. Pt has history of R sided RTC tears and orthopedic MD stating return to full AROM in RUE may not be feasible. No MRI for RTC tissue quality as pt reports MD wants to see how pt manages with AROM with fracture recovery. Pt is R handed. Currently receiving HH aide for 4 hours in the morning to assist ADL's primarily dressing. No longer requiring pain meds except Tylenol PRN. Pain vastly has improved, worst pain nowadays is a 4/10 NPS sporadically. Has been working  on elbow extension in recliner, some numbness in hand at night palmar and dorsal. Rated as moderate, unsure of time of numbness.   PAIN:  Are you having pain? Yes: NPRS scale: 0/10 Pain location: R shoulder Pain description: Dull/achey Aggravating factors: RUE movement Relieving factors: Rest  PRECAUTIONS: See protocol in chart  WEIGHT BEARING RESTRICTIONS Yes WBAT on RUE  FALLS:  Has patient fallen in last 6 months? No  LIVING ENVIRONMENT: Lives with: lives with their family and lives alone Lives in: House/apartment Stairs: Yes: External: 3 steps; on right going up Has following equipment at home: None  OCCUPATION: Retired  PLOF: [place] [employer]. Return to full independence.    OBJECTIVE:   DIAGNOSTIC FINDINGS:  XR Shoulder 3 Or More Views Right  Final Result   Acute, comminuted and anteriorly displaced fracture of the right proximal humerus with involvement of the surgical and anatomic necks and fragmentation of the greater tuberosity. Large fracture fragment containing the majority of the glenoid articular surface is rotated laterally.   PATIENT SURVEYS:  FOTO 19 with target of 106  COGNITION: Overall cognitive status: Within functional limits for tasks assessed     SENSATION: WFL  POSTURE: Upper trap activation most likely due to wearing sling. Overall WNL.    CERVICAL AROM:  Flexion: 50 degrees Extension: 45 degrees Rotation R/L: 50/45 degrees Lateral flexion (R/L): 30/35 degrees   UPPER EXTREMITY ROM:   Active ROM Right eval Left eval  Shoulder flexion PROM 74 Full  Shoulder extension    Shoulder abduction PROM 78 Full  Shoulder adduction    Shoulder internal rotation Deferred til week 6 per protocol Full  Shoulder external rotation 16 Full  Elbow flexion 129 Full  Elbow extension 30 from full ext 0 degrees  Wrist flexion    Wrist extension    Wrist ulnar deviation    Wrist radial deviation    Wrist  pronation    Wrist supination    (Blank rows = not tested)  UPPER EXTREMITY MMT:  RUE deferred due to NWB status and in sling MMT Right eval Left eval  Shoulder flexion  5/5  Shoulder extension    Shoulder abduction  5/5  Shoulder adduction    Shoulder internal rotation  5/5  Shoulder external rotation  5/5  Middle trapezius    Lower trapezius    Elbow flexion  5/5  Elbow extension  5/5  Wrist flexion  5/5  Wrist extension  5/5  Wrist ulnar deviation    Wrist radial deviation    Wrist pronation    Wrist supination    Grip strength (lbs) 30.1 lbs (avg 2 trials) 36.5 lbs (avg 2 trials)  (Blank rows = not tested)   PALPATION: TTP along proximal humerus of RUE.     04/02/22:  R shoulder A/PROM in supine: flexion (94 deg.), abduction (92 deg.), ER (33 deg.), IR (78 deg.).  Supine R shoulder PROM flexion 124 deg. (Pain).  Standing R shoulder AROM: flexion (85 deg.), abduction (82 deg.).     TODAY'S TREATMENT (04/07/22)  SUBJECTIVE:   Pt. Reports 3/10 R shoulder discomfort/ pain this morning prior to taking Tylenol.  Pt. Trying to sleep in normal bed but shoulder is not as well supported.  Pt. States MD appt was yesterday and bone is healing but not completely healed.  MD says it will take 2 more months to heal.  Pt. Allowed to drive but will continue to have a friend bring her to PT tx.  Pt. Has a trip to [facility] next week.       PAIN: 1-3/10 prior to tx. Session.  Pt. Arrived to PT without use of sling.  Pts. Friend drove her to PT.     Ther-ex.   04/07/22:    B UBE 3 minutes forward/backwards.  Warm-up/ discussed weekend activities.    Seated wand AAROM: shoulder flexion/ scaption/ abduction AA/PROM in chair (moderate PT assist).  Standing wand shoulder extension/ IR/ chest press 20x.  Cuing to prevent L lateral lean/ R UT overcompensation.  Nautilus: seated lat. Pull downs with static shoulder flexion stretches 20x (40#).  Standing 20# tricep ext. With bar 20x.  Pain  tolerable range.  Supine R shoulder IR/ER/bicep flexion/ extension manual isometrics 5x each (min. Resistance).    Manual tx.:  Supine passive R elbow extension / flexion x 10.  Supine PROM: shoulder flexion/ abduction to 90+ deg. (pain with end-range flexion).   STM to R shoulder/ UT/ biceps musculature in seated/supine position.    Pt. Will ice at home.       PATIENT EDUCATION: Education details: form/technique with exercise and updated HEP Person educated: Patient Education method: Explanation, Demonstration, Tactile cues, Verbal cues, and Handouts Education comprehension: verbalized understanding, returned demonstration, and needs further education   HOME EXERCISE PROGRAM: Access Code: [email or web address]  Access Code: [email or web address] URL: [email or web address] Date: 03/12/2022 Prepared by: Dorcas Carrow   Exercises - Seated Shoulder Flexion AAROM with Pulley Behind  - 2 x daily - 7 x weekly - 2 sets - 10 reps - Seated Shoulder Scaption AAROM with Pulley at Side  - 2 x daily - 7 x weekly - 2 sets - 10 reps - Supine Shoulder Press with Dowel  - 1 x daily - 7 x weekly - 3 sets - 10 reps   ASSESSMENT:  CLINICAL IMPRESSION: R shoulder joint hypomobility with all flexion/ abduction/ ER in seated/supine positions.  Significant R UT muscle tightness/ overcompensation during A/AROM in seated and standing posture.  Pt. Encouraged to use R  UE more with daily tasks in a pain tolerable range.  She will benefit from skilled PT services to continue to address her decreased ROM and strength in RUE in order to optimize strength and function of RUE for return to full independence in all activities.    OBJECTIVE IMPAIRMENTS decreased mobility, decreased ROM, decreased strength, hypomobility, impaired flexibility, improper body mechanics, postural dysfunction, and pain.   ACTIVITY LIMITATIONS carrying, lifting, bathing, toileting, dressing, reach over head, and  hygiene/grooming  PARTICIPATION LIMITATIONS: cleaning, driving, shopping, community activity, occupation, and yard work  PERSONAL FACTORS Age, Behavior pattern, Past/current experiences, Time since onset of injury/illness/exacerbation, Transportation, and 3+ comorbidities: HLD, lumbar DDD, OA of cervical spine, scoliosis  are also affecting patient's functional outcome.   REHAB POTENTIAL: Fair History of R sided RTC tears  CLINICAL DECISION MAKING: Evolving/moderate complexity  EVALUATION COMPLEXITY: Moderate   GOALS: Goals reviewed with patient? No  SHORT TERM GOALS: Target date: 04/14/2022 unless otherwise stated below  Pt will be independent with HEP to improve R shoulder mobility and strength for ADL completion Baseline: Initiated and given Goal status: Partially met  2.  Pt will improve R shoulder PROM to 90 degrees per protocol in prep for progression of AAROM of R shoulder. Baseline: 74 degrees.  R shoulder 95 deg.  Goal status: Goal met Target date: 04/14/2022   3.  Pt will improve R elbow extension to 0 degrees to prevent elbow contracture for ease of RUE ADL completion.  Baseline: Lacking 30 degrees from terminal extension Goal status: Partially met Target date: 04/14/2022    LONG TERM GOALS: Target date: 05/26/2022 unless otherwise posted  Pt will display full PROM by 8 weeks to demonstrate full joint mobility Baseline: Limited in flexion, ER, Abduction Goal status: INITIAL Target Date: 04/28/2022  2.  Pt will improve FOTO to target score to demonstrate clinically significant improvement in functional mobility of RUE Baseline: 19 with target score of 55 Goal status: INITIAL  3.  Pt will demonstrate at least 3/5 strength in RUE in flexion and abduction for overhead ADL completion Baseline: unable to test currently Goal status: INITIAL  4.  Pt will demonstrate > 120 degrees of RUE shoulder flexion to demonstrate adequate mobility to complete needed overhead ADL  tasks  Baseline: PROM of 74 degrees flexion, 78 PROM abduction Goal status: INITIAL    PLAN: PT FREQUENCY: 2x/week  PT DURATION: 12 weeks  PLANNED INTERVENTIONS: Therapeutic exercises, Therapeutic activity, Neuromuscular re-education, Patient/Family education, Self Care, Joint mobilization, Dry Needling, Electrical stimulation, Spinal mobilization, Cryotherapy, Moist heat, and Manual therapy  PLAN FOR NEXT SESSION: Progress R shoulder mobility per protocol - further shoulder and elbow PROM and AAROM. Periscap strengthening, cervical and elbow mobility.     Pura Spice, PT, DPT # [phone] Physical Therapist- [facility]  04/07/2022, 12:41 PM

## 2022-04-09 ENCOUNTER — Encounter: Payer: Self-pay | Admitting: Physical Therapy

## 2022-04-09 ENCOUNTER — Ambulatory Visit: Payer: Medicare HMO | Admitting: Physical Therapy

## 2022-04-09 DIAGNOSIS — M25511 Pain in right shoulder: Secondary | ICD-10-CM | POA: Diagnosis not present

## 2022-04-09 DIAGNOSIS — M6281 Muscle weakness (generalized): Secondary | ICD-10-CM | POA: Diagnosis not present

## 2022-04-09 DIAGNOSIS — S42201D Unspecified fracture of upper end of right humerus, subsequent encounter for fracture with routine healing: Secondary | ICD-10-CM | POA: Diagnosis not present

## 2022-04-09 NOTE — Therapy (Signed)
OUTPATIENT PHYSICAL THERAPY SHOULDER TREATMENT  Patient Name: Jennifer Morrow MRN: [id number] DOB:02-Apr-1951, 71 y.o., female Today's Date: 04/09/2022   PT End of Session - 04/09/22 0942     Visit Number 11    Number of Visits 25    Date for PT Re-Evaluation 05/26/22    PT Start Time 0942    PT Stop Time 1033    PT Time Calculation (min) 51 min    Activity Tolerance Patient tolerated treatment well;Patient limited by pain    Behavior During Therapy [facility] for tasks assessed/performed             Past Medical History:  Diagnosis Date   Arthritis    Bronchitis    CHRONIC   Bulging lumbar disc    degenerative dics   Scoliosis    LIMITS LUNG FUNCTION   Spinal stenosis    Past Surgical History:  Procedure Laterality Date   CATARACT EXTRACTION W/PHACO Left 01/06/2018   Procedure: CATARACT EXTRACTION PHACO AND INTRAOCULAR LENS PLACEMENT ([place]);  Surgeon: Eulogio Bear, MD;  Location: [facility] ORS;  Service: Ophthalmology;  Laterality: Left;  [place] 00:27.5 AP% 4.3 CDE 1.18 FLUID PACK LOT # [id number]   CATARACT EXTRACTION W/PHACO Right 02/10/2018   Procedure: CATARACT EXTRACTION PHACO AND INTRAOCULAR LENS PLACEMENT ([facility]);  Surgeon: Eulogio Bear, MD;  Location: [facility] ORS;  Service: Ophthalmology;  Laterality: Right;  [place] 00:30.5 AP% 5.0 CDE 1.51  Fluid pack lot # [id number]   FOOT X 2     HEEL SPUR EXCISION     TONSILLECTOMY     Patient Active Problem List   Diagnosis Date Noted   Osteoarthritis cervical spine 01/14/2022   Age-related osteoporosis without current pathological fracture 05/19/2021   Spondylosis of lumbar region without myelopathy or radiculopathy 05/19/2021   Degenerative disc disease, lumbar 05/19/2021   Scoliosis, unspecified 01/09/2015   Vitamin D deficiency 01/08/2014   Mixed hyperlipidemia 01/08/2014    PCP: Halina Maidens MD  REFERRING PROVIDER: Renee Harder MD  REFERRING DIAG: Closed fracture of proximal right humerus   THERAPY DIAG:   Acute pain of right shoulder  Muscle weakness (generalized)  Closed fracture of proximal end of right humerus with routine healing, unspecified fracture morphology, subsequent encounter  Rationale for Evaluation and Treatment Rehabilitation  ONSET DATE: 01/30/22  SUBJECTIVE:                                                                                                                                                                                      SUBJECTIVE STATEMENT: Pt is s/p R proximal humerus fracture on 01/30/22 for conservative management. Pt has been doing her HEP, and is  able to roll briefly onto her L shoulder while sleeping. 0/10 pain prior to tx. Pt has pain while dressing herself, and is not able to fully independently don/doff a shirt yet. Pt stated that she has pain in her R low back, with numbness down her LLE and L low back but is chronic pain she has dealt with for years. Pt currently sleeps in adjustable bed, with the ability to raise the head/feet of the bed. Has been compliant with HEP reporting shoulder is moving better with less pain with mobility.   PERTINENT HISTORY: Pt presenting with R shoulder pain s/p non operative proximal humerus fracture. Pt reports having a tent fall on her leading her to fall to the ground on her R side. Pt here with non-operative PT protocol. Reports orthopedic MD planning for 6-8 weeks of donning sling. Pt has history of R sided RTC tears and orthopedic MD stating return to full AROM in RUE may not be feasible. No MRI for RTC tissue quality as pt reports MD wants to see how pt manages with AROM with fracture recovery. Pt is R handed. Currently receiving HH aide for 4 hours in the morning to assist ADL's primarily dressing. No longer requiring pain meds except Tylenol PRN. Pain vastly has improved, worst pain nowadays is a 4/10 NPS sporadically. Has been working on elbow extension in recliner, some numbness in hand at night palmar and dorsal.  Rated as moderate, unsure of time of numbness.   PAIN:  Are you having pain? Yes: NPRS scale: 0/10 Pain location: R shoulder Pain description: Dull/achey Aggravating factors: RUE movement Relieving factors: Rest  PRECAUTIONS: See protocol in chart  WEIGHT BEARING RESTRICTIONS Yes WBAT on RUE  FALLS:  Has patient fallen in last 6 months? No  LIVING ENVIRONMENT: Lives with: lives with their family and lives alone Lives in: House/apartment Stairs: Yes: External: 3 steps; on right going up Has following equipment at home: None  OCCUPATION: Retired  PLOF: [place] [employer]. Return to full independence.    OBJECTIVE:   DIAGNOSTIC FINDINGS:  XR Shoulder 3 Or More Views Right  Final Result   Acute, comminuted and anteriorly displaced fracture of the right proximal humerus with involvement of the surgical and anatomic necks and fragmentation of the greater tuberosity. Large fracture fragment containing the majority of the glenoid articular surface is rotated laterally.   PATIENT SURVEYS:  FOTO 19 with target of 40  COGNITION: Overall cognitive status: Within functional limits for tasks assessed     SENSATION: WFL  POSTURE: Upper trap activation most likely due to wearing sling. Overall WNL.    CERVICAL AROM:  Flexion: 50 degrees Extension: 45 degrees Rotation R/L: 50/45 degrees Lateral flexion (R/L): 30/35 degrees   UPPER EXTREMITY ROM:   Active ROM Right eval Left eval  Shoulder flexion PROM 74 Full  Shoulder extension    Shoulder abduction PROM 78 Full  Shoulder adduction    Shoulder internal rotation Deferred til week 6 per protocol Full  Shoulder external rotation 16 Full  Elbow flexion 129 Full  Elbow extension 30 from full ext 0 degrees  Wrist flexion    Wrist extension    Wrist ulnar deviation    Wrist radial deviation    Wrist pronation    Wrist supination    (Blank rows = not tested)  UPPER EXTREMITY  MMT:    RUE deferred due to NWB status and in sling MMT Right eval Left eval  Shoulder flexion  5/5  Shoulder extension    Shoulder abduction  5/5  Shoulder adduction    Shoulder internal rotation  5/5  Shoulder external rotation  5/5  Middle trapezius    Lower trapezius    Elbow flexion  5/5  Elbow extension  5/5  Wrist flexion  5/5  Wrist extension  5/5  Wrist ulnar deviation    Wrist radial deviation    Wrist pronation    Wrist supination    Grip strength (lbs) 30.1 lbs (avg 2 trials) 36.5 lbs (avg 2 trials)  (Blank rows = not tested)   PALPATION: TTP along proximal humerus of RUE.     04/02/22:  R shoulder A/PROM in supine: flexion (94 deg.), abduction (92 deg.), ER (33 deg.), IR (78 deg.).  Supine R shoulder PROM flexion 124 deg. (Pain).  Standing R shoulder AROM: flexion (85 deg.), abduction (82 deg.).     TODAY'S TREATMENT (04/09/22)  SUBJECTIVE:   Pt. Reports 4/10 R shoulder discomfort/ pain this morning prior to taking Tylenol.  Pt. Was sore after last PT tx. Session.  Pt. Has a trip to [facility] next week.       PAIN: 1-4/10 prior to tx. Session.  Pt. Arrived to PT without use of sling.  Pts. Friend drove her to PT.     Ther-ex.:   04/09/22:    Nustep L4 10 min. B UE/LE.  Discussed upcoming activities.    Nautilus: seated 40# lat. Pull downs with static shoulder flexion stretches 20x (as tolerated).  Standing 20# tricep ext./ sh. Extension/ 30# scap. retraction With bar 20x.  Pain tolerable range.   Seated wand AAROM: shoulder flexion/ scaption/ abduction AA/PROM in chair (moderate PT assist).  Supine R shoulder IR/ER/bicep flexion/ extension manual isometrics 5x each (min. Resistance).    Manual tx.:  Supine passive R elbow extension / flexion x 10.  Supine PROM: shoulder flexion/ abduction to 90+ deg. (pain with end-range flexion).   STM to R shoulder/ UT/ biceps musculature in seated/supine position.    Pt. Will ice at home.       PATIENT  EDUCATION: Education details: form/technique with exercise and updated HEP Person educated: Patient Education method: Explanation, Demonstration, Tactile cues, Verbal cues, and Handouts Education comprehension: verbalized understanding, returned demonstration, and needs further education   HOME EXERCISE PROGRAM: Access Code: [email or web address]  Access Code: [email or web address] URL: [email or web address] Date: 03/12/2022 Prepared by: Dorcas Carrow   Exercises - Seated Shoulder Flexion AAROM with Pulley Behind  - 2 x daily - 7 x weekly - 2 sets - 10 reps - Seated Shoulder Scaption AAROM with Pulley at Side  - 2 x daily - 7 x weekly - 2 sets - 10 reps - Supine Shoulder Press with Dowel  - 1 x daily - 7 x weekly - 3 sets - 10 reps   ASSESSMENT:  CLINICAL IMPRESSION: R shoulder joint hypomobility with all flexion/ abduction/ ER in seated/supine positions.  Significant R UT muscle tightness/ overcompensation during A/AROM in seated and standing posture.  Pt. Encouraged to use R UE more with daily tasks in a pain tolerable range. No shoulder mobs. At this time until full healing of bone documented.  She will benefit from skilled PT services to continue to address her decreased ROM and strength in RUE in order to optimize strength and function of RUE for return to full independence in all activities.    OBJECTIVE IMPAIRMENTS decreased mobility, decreased ROM, decreased strength, hypomobility, impaired flexibility, improper body mechanics, postural dysfunction, and  pain.   ACTIVITY LIMITATIONS carrying, lifting, bathing, toileting, dressing, reach over head, and hygiene/grooming  PARTICIPATION LIMITATIONS: cleaning, driving, shopping, community activity, occupation, and yard work  PERSONAL FACTORS Age, Behavior pattern, Past/current experiences, Time since onset of injury/illness/exacerbation, Transportation, and 3+ comorbidities: HLD, lumbar DDD, OA of cervical spine, scoliosis  are also affecting  patient's functional outcome.   REHAB POTENTIAL: Fair History of R sided RTC tears  CLINICAL DECISION MAKING: Evolving/moderate complexity  EVALUATION COMPLEXITY: Moderate   GOALS: Goals reviewed with patient? No  SHORT TERM GOALS: Target date: 04/14/2022 unless otherwise stated below  Pt will be independent with HEP to improve R shoulder mobility and strength for ADL completion Baseline: Initiated and given Goal status: Partially met  2.  Pt will improve R shoulder PROM to 90 degrees per protocol in prep for progression of AAROM of R shoulder. Baseline: 74 degrees.  R shoulder 95 deg.  Goal status: Goal met Target date: 04/14/2022   3.  Pt will improve R elbow extension to 0 degrees to prevent elbow contracture for ease of RUE ADL completion.  Baseline: Lacking 30 degrees from terminal extension Goal status: Partially met Target date: 04/14/2022    LONG TERM GOALS: Target date: 05/26/2022 unless otherwise posted  Pt will display full PROM by 8 weeks to demonstrate full joint mobility Baseline: Limited in flexion, ER, Abduction Goal status: INITIAL Target Date: 04/28/2022  2.  Pt will improve FOTO to target score to demonstrate clinically significant improvement in functional mobility of RUE Baseline: 19 with target score of 55 Goal status: INITIAL  3.  Pt will demonstrate at least 3/5 strength in RUE in flexion and abduction for overhead ADL completion Baseline: unable to test currently Goal status: INITIAL  4.  Pt will demonstrate > 120 degrees of RUE shoulder flexion to demonstrate adequate mobility to complete needed overhead ADL tasks  Baseline: PROM of 74 degrees flexion, 78 PROM abduction Goal status: INITIAL    PLAN: PT FREQUENCY: 2x/week  PT DURATION: 12 weeks  PLANNED INTERVENTIONS: Therapeutic exercises, Therapeutic activity, Neuromuscular re-education, Patient/Family education, Self Care, Joint mobilization, Dry Needling, Electrical stimulation, Spinal  mobilization, Cryotherapy, Moist heat, and Manual therapy  PLAN FOR NEXT SESSION: Progress R shoulder mobility per protocol - further shoulder and elbow PROM and AAROM. Periscap strengthening, cervical and elbow mobility.     Pura Spice, PT, DPT # [id number] Physical Therapist- [facility]  04/09/2022, 12:47 PM

## 2022-04-10 DIAGNOSIS — M5136 Other intervertebral disc degeneration, lumbar region: Secondary | ICD-10-CM | POA: Diagnosis not present

## 2022-04-10 DIAGNOSIS — M419 Scoliosis, unspecified: Secondary | ICD-10-CM | POA: Diagnosis not present

## 2022-04-10 DIAGNOSIS — M47816 Spondylosis without myelopathy or radiculopathy, lumbar region: Secondary | ICD-10-CM | POA: Diagnosis not present

## 2022-04-10 DIAGNOSIS — M5416 Radiculopathy, lumbar region: Secondary | ICD-10-CM | POA: Diagnosis not present

## 2022-04-14 ENCOUNTER — Ambulatory Visit: Payer: Medicare HMO | Admitting: Physical Therapy

## 2022-04-14 ENCOUNTER — Encounter: Payer: Self-pay | Admitting: Physical Therapy

## 2022-04-14 DIAGNOSIS — M25511 Pain in right shoulder: Secondary | ICD-10-CM | POA: Diagnosis not present

## 2022-04-14 DIAGNOSIS — S42201D Unspecified fracture of upper end of right humerus, subsequent encounter for fracture with routine healing: Secondary | ICD-10-CM

## 2022-04-14 DIAGNOSIS — M6281 Muscle weakness (generalized): Secondary | ICD-10-CM

## 2022-04-14 NOTE — Therapy (Signed)
OUTPATIENT PHYSICAL THERAPY SHOULDER TREATMENT  Patient Name: Jennifer Morrow MRN: [id number] DOB:March 04, 1951, 71 y.o., female Today's Date: 04/14/2022   PT End of Session - 04/14/22 1032     Visit Number 12    Number of Visits 25    Date for PT Re-Evaluation 05/26/22    PT Start Time 1027    PT Stop Time 1115    PT Time Calculation (min) 48 min    Activity Tolerance Patient tolerated treatment well;Patient limited by pain    Behavior During Therapy [facility] for tasks assessed/performed             Past Medical History:  Diagnosis Date   Arthritis    Bronchitis    CHRONIC   Bulging lumbar disc    degenerative dics   Scoliosis    LIMITS LUNG FUNCTION   Spinal stenosis    Past Surgical History:  Procedure Laterality Date   CATARACT EXTRACTION W/PHACO Left 01/06/2018   Procedure: CATARACT EXTRACTION PHACO AND INTRAOCULAR LENS PLACEMENT ([place]);  Surgeon: Eulogio Bear, MD;  Location: [facility] ORS;  Service: Ophthalmology;  Laterality: Left;  [place] 00:27.5 AP% 4.3 CDE 1.18 FLUID PACK LOT # [id number]   CATARACT EXTRACTION W/PHACO Right 02/10/2018   Procedure: CATARACT EXTRACTION PHACO AND INTRAOCULAR LENS PLACEMENT ([facility]);  Surgeon: Eulogio Bear, MD;  Location: [facility] ORS;  Service: Ophthalmology;  Laterality: Right;  [place] 00:30.5 AP% 5.0 CDE 1.51  Fluid pack lot # [id number]   FOOT X 2     HEEL SPUR EXCISION     TONSILLECTOMY     Patient Active Problem List   Diagnosis Date Noted   Osteoarthritis cervical spine 01/14/2022   Age-related osteoporosis without current pathological fracture 05/19/2021   Spondylosis of lumbar region without myelopathy or radiculopathy 05/19/2021   Degenerative disc disease, lumbar 05/19/2021   Scoliosis, unspecified 01/09/2015   Vitamin D deficiency 01/08/2014   Mixed hyperlipidemia 01/08/2014    PCP: Halina Maidens MD  REFERRING PROVIDER: Renee Harder MD  REFERRING DIAG: Closed fracture of proximal right humerus   THERAPY DIAG:   Acute pain of right shoulder  Muscle weakness (generalized)  Closed fracture of proximal end of right humerus with routine healing, unspecified fracture morphology, subsequent encounter  Rationale for Evaluation and Treatment Rehabilitation  ONSET DATE: 01/30/22  SUBJECTIVE:                                                                                                                                                                                      SUBJECTIVE STATEMENT: Pt is s/p R proximal humerus fracture on 01/30/22 for conservative management. Pt has been doing her HEP, and is  able to roll briefly onto her L shoulder while sleeping. 0/10 pain prior to tx. Pt has pain while dressing herself, and is not able to fully independently don/doff a shirt yet. Pt stated that she has pain in her R low back, with numbness down her LLE and L low back but is chronic pain she has dealt with for years. Pt currently sleeps in adjustable bed, with the ability to raise the head/feet of the bed. Has been compliant with HEP reporting shoulder is moving better with less pain with mobility.   PERTINENT HISTORY: Pt presenting with R shoulder pain s/p non operative proximal humerus fracture. Pt reports having a tent fall on her leading her to fall to the ground on her R side. Pt here with non-operative PT protocol. Reports orthopedic MD planning for 6-8 weeks of donning sling. Pt has history of R sided RTC tears and orthopedic MD stating return to full AROM in RUE may not be feasible. No MRI for RTC tissue quality as pt reports MD wants to see how pt manages with AROM with fracture recovery. Pt is R handed. Currently receiving HH aide for 4 hours in the morning to assist ADL's primarily dressing. No longer requiring pain meds except Tylenol PRN. Pain vastly has improved, worst pain nowadays is a 4/10 NPS sporadically. Has been working on elbow extension in recliner, some numbness in hand at night palmar and dorsal.  Rated as moderate, unsure of time of numbness.   PAIN:  Are you having pain? Yes: NPRS scale: 0/10 Pain location: R shoulder Pain description: Dull/achey Aggravating factors: RUE movement Relieving factors: Rest  PRECAUTIONS: See protocol in chart  WEIGHT BEARING RESTRICTIONS Yes WBAT on RUE  FALLS:  Has patient fallen in last 6 months? No  LIVING ENVIRONMENT: Lives with: lives with their family and lives alone Lives in: House/apartment Stairs: Yes: External: 3 steps; on right going up Has following equipment at home: None  OCCUPATION: Retired  PLOF: [place] motion/strength. Return to full independence.    OBJECTIVE:   DIAGNOSTIC FINDINGS:  XR Shoulder 3 Or More Views Right  Final Result   Acute, comminuted and anteriorly displaced fracture of the right proximal humerus with involvement of the surgical and anatomic necks and fragmentation of the greater tuberosity. Large fracture fragment containing the majority of the glenoid articular surface is rotated laterally.   PATIENT SURVEYS:  FOTO 19 with target of 40  COGNITION: Overall cognitive status: Within functional limits for tasks assessed     SENSATION: WFL  POSTURE: Upper trap activation most likely due to wearing sling. Overall WNL.    CERVICAL AROM:  Flexion: 50 degrees Extension: 45 degrees Rotation R/L: 50/45 degrees Lateral flexion (R/L): 30/35 degrees   UPPER EXTREMITY ROM:   Active ROM Right eval Left eval  Shoulder flexion PROM 74 Full  Shoulder extension    Shoulder abduction PROM 78 Full  Shoulder adduction    Shoulder internal rotation Deferred til week 6 per protocol Full  Shoulder external rotation 16 Full  Elbow flexion 129 Full  Elbow extension 30 from full ext 0 degrees  Wrist flexion    Wrist extension    Wrist ulnar deviation    Wrist radial deviation    Wrist pronation    Wrist supination    (Blank rows = not tested)  UPPER EXTREMITY  MMT:    RUE deferred due to NWB status and in sling MMT Right eval Left eval  Shoulder flexion  5/5  Shoulder extension    Shoulder abduction  5/5  Shoulder adduction    Shoulder internal rotation  5/5  Shoulder external rotation  5/5  Middle trapezius    Lower trapezius    Elbow flexion  5/5  Elbow extension  5/5  Wrist flexion  5/5  Wrist extension  5/5  Wrist ulnar deviation    Wrist radial deviation    Wrist pronation    Wrist supination    Grip strength (lbs) 30.1 lbs (avg 2 trials) 36.5 lbs (avg 2 trials)  (Blank rows = not tested)   PALPATION: TTP along proximal humerus of RUE.     04/02/22:  R shoulder A/PROM in supine: flexion (94 deg.), abduction (92 deg.), ER (33 deg.), IR (78 deg.).  Supine R shoulder PROM flexion 124 deg. (Pain).  Standing R shoulder AROM: flexion (85 deg.), abduction (82 deg.).     TODAY'S TREATMENT (04/14/22)  SUBJECTIVE:   Pt. Reports 1-2/10 R shoulder discomfort/ pain after taking 1,000 mg of extra strength Tylenol.  Pt. Has continued swelling and soreness in R shoulder/upper arm at night and using ice to manage.         PAIN: 1-4/10 prior to tx. Session.  Pt. Arrived to PT without use of sling.  Pts. Friend drove her to PT.     Ther-ex.:   04/14/22:    B UBE 3.5 min. F/b (consistent cadence)- discussed weekend activities.    Yellow bulb grasping on R: 2 psi.  Handheld dynamometer: L 56.2#/ R 35.7#.    Seated wand AAROM: shoulder flexion/ chest press AA/PROM in chair (moderate PT assist).  Standing shoulder extension/ IR/ ER 10x each.    Nautilus: seated 40# lat. Pull downs with static shoulder flexion stretches 20x (as tolerated).  Standing 20# tricep ext./ sh. Extension/ 30# scap. retraction With bar 20x.  Pain tolerable range.   Supine R shoulder IR/ER/extension manual isometrics 5x each (min. Resistance).    Manual tx.:  Seated R shoulder PROM (flexion/ scaption/ abduction)- 10x each with PT assist.  STM to R shoulder/ UT  region.  R shoulder muscle energy technique with IR/ER (increase ROM)  Supine passive R elbow extension / flexion x 10.  Supine PROM: shoulder flexion/ abduction to 90+ deg. (pain with end-range flexion).     Pt. Will ice at home.       PATIENT EDUCATION: Education details: form/technique with exercise and updated HEP Person educated: Patient Education method: Explanation, Demonstration, Tactile cues, Verbal cues, and Handouts Education comprehension: verbalized understanding, returned demonstration, and needs further education   HOME EXERCISE PROGRAM: Access Code: [email or web address]  Access Code: [email or web address] URL: [email or web address] Date: 03/12/2022 Prepared by: Dorcas Carrow   Exercises - Seated Shoulder Flexion AAROM with Pulley Behind  - 2 x daily - 7 x weekly - 2 sets - 10 reps - Seated Shoulder Scaption AAROM with Pulley at Side  - 2 x daily - 7 x weekly - 2 sets - 10 reps - Supine Shoulder Press with Dowel  - 1 x daily - 7 x weekly - 3 sets - 10 reps   ASSESSMENT:  CLINICAL IMPRESSION: R shoulder joint hypomobility with all flexion/ abduction/ ER in seated/supine positions.  Significant R UT muscle tightness/ overcompensation during A/AROM in seated and standing posture.  Pt. Encouraged to use R UE more with daily tasks in a pain tolerable range. No shoulder mobs. At this time until full healing of bone documented.  She will benefit from skilled PT services  to continue to address her decreased ROM and strength in RUE in order to optimize strength and function of RUE for return to full independence in all activities.    OBJECTIVE IMPAIRMENTS decreased mobility, decreased ROM, decreased strength, hypomobility, impaired flexibility, improper body mechanics, postural dysfunction, and pain.   ACTIVITY LIMITATIONS carrying, lifting, bathing, toileting, dressing, reach over head, and hygiene/grooming  PARTICIPATION LIMITATIONS: cleaning, driving, shopping, community  activity, occupation, and yard work  PERSONAL FACTORS Age, Behavior pattern, Past/current experiences, Time since onset of injury/illness/exacerbation, Transportation, and 3+ comorbidities: HLD, lumbar DDD, OA of cervical spine, scoliosis  are also affecting patient's functional outcome.   REHAB POTENTIAL: Fair History of R sided RTC tears  CLINICAL DECISION MAKING: Evolving/moderate complexity  EVALUATION COMPLEXITY: Moderate   GOALS: Goals reviewed with patient? No  SHORT TERM GOALS: Target date: 04/14/2022 unless otherwise stated below  Pt will be independent with HEP to improve R shoulder mobility and strength for ADL completion Baseline: Initiated and given Goal status: Partially met  2.  Pt will improve R shoulder PROM to 90 degrees per protocol in prep for progression of AAROM of R shoulder. Baseline: 74 degrees.  R shoulder 95 deg.  Goal status: Goal met Target date: 04/14/2022   3.  Pt will improve R elbow extension to 0 degrees to prevent elbow contracture for ease of RUE ADL completion.  Baseline: Lacking 30 degrees from terminal extension Goal status: Partially met Target date: 04/14/2022    LONG TERM GOALS: Target date: 05/26/2022 unless otherwise posted  Pt will display full PROM by 8 weeks to demonstrate full joint mobility Baseline: Limited in flexion, ER, Abduction Goal status: INITIAL Target Date: 04/28/2022  2.  Pt will improve FOTO to target score to demonstrate clinically significant improvement in functional mobility of RUE Baseline: 19 with target score of 55 Goal status: INITIAL  3.  Pt will demonstrate at least 3/5 strength in RUE in flexion and abduction for overhead ADL completion Baseline: unable to test currently Goal status: INITIAL  4.  Pt will demonstrate > 120 degrees of RUE shoulder flexion to demonstrate adequate mobility to complete needed overhead ADL tasks  Baseline: PROM of 74 degrees flexion, 78 PROM abduction Goal status:  INITIAL    PLAN: PT FREQUENCY: 2x/week  PT DURATION: 12 weeks  PLANNED INTERVENTIONS: Therapeutic exercises, Therapeutic activity, Neuromuscular re-education, Patient/Family education, Self Care, Joint mobilization, Dry Needling, Electrical stimulation, Spinal mobilization, Cryotherapy, Moist heat, and Manual therapy  PLAN FOR NEXT SESSION: Progress R shoulder mobility per protocol - further shoulder and elbow PROM and AAROM. Periscap strengthening, cervical and elbow mobility.     Pura Spice, PT, DPT # [id number] Physical Therapist- [facility]  04/14/2022, 3:41 PM

## 2022-04-16 ENCOUNTER — Encounter: Payer: Medicare HMO | Admitting: Physical Therapy

## 2022-04-17 ENCOUNTER — Encounter: Payer: Medicare HMO | Admitting: Physical Therapy

## 2022-04-23 ENCOUNTER — Ambulatory Visit: Payer: Medicare HMO | Attending: Orthopaedic Surgery | Admitting: Physical Therapy

## 2022-04-23 ENCOUNTER — Encounter: Payer: Self-pay | Admitting: Physical Therapy

## 2022-04-23 DIAGNOSIS — M6281 Muscle weakness (generalized): Secondary | ICD-10-CM | POA: Insufficient documentation

## 2022-04-23 DIAGNOSIS — S42201D Unspecified fracture of upper end of right humerus, subsequent encounter for fracture with routine healing: Secondary | ICD-10-CM | POA: Diagnosis not present

## 2022-04-23 DIAGNOSIS — M25511 Pain in right shoulder: Secondary | ICD-10-CM | POA: Insufficient documentation

## 2022-04-23 NOTE — Therapy (Signed)
OUTPATIENT PHYSICAL THERAPY SHOULDER TREATMENT  Patient Name: Jennifer Morrow MRN: [id number] DOB:20-Nov-1950, 71 y.o., female Today's Date: 04/23/2022   PT End of Session - 04/23/22 0949     Visit Number 13    Number of Visits 25    Date for PT Re-Evaluation 05/26/22    PT Start Time 0947    Activity Tolerance Patient tolerated treatment well;Patient limited by pain    Behavior During Therapy [facility] for tasks assessed/performed            0947 to 8938  (46 minutes)   Past Medical History:  Diagnosis Date   Arthritis    Bronchitis    CHRONIC   Bulging lumbar disc    degenerative dics   Scoliosis    LIMITS LUNG FUNCTION   Spinal stenosis    Past Surgical History:  Procedure Laterality Date   CATARACT EXTRACTION W/PHACO Left 01/06/2018   Procedure: CATARACT EXTRACTION PHACO AND INTRAOCULAR LENS PLACEMENT ([place]);  Surgeon: Eulogio Bear, MD;  Location: [facility] ORS;  Service: Ophthalmology;  Laterality: Left;  [place] 00:27.5 AP% 4.3 CDE 1.18 FLUID PACK LOT # [id number]   CATARACT EXTRACTION W/PHACO Right 02/10/2018   Procedure: CATARACT EXTRACTION PHACO AND INTRAOCULAR LENS PLACEMENT ([facility]);  Surgeon: Eulogio Bear, MD;  Location: [facility] ORS;  Service: Ophthalmology;  Laterality: Right;  [place] 00:30.5 AP% 5.0 CDE 1.51  Fluid pack lot # [id number]   FOOT X 2     HEEL SPUR EXCISION     TONSILLECTOMY     Patient Active Problem List   Diagnosis Date Noted   Osteoarthritis cervical spine 01/14/2022   Age-related osteoporosis without current pathological fracture 05/19/2021   Spondylosis of lumbar region without myelopathy or radiculopathy 05/19/2021   Degenerative disc disease, lumbar 05/19/2021   Scoliosis, unspecified 01/09/2015   Vitamin D deficiency 01/08/2014   Mixed hyperlipidemia 01/08/2014    PCP: Halina Maidens MD  REFERRING PROVIDER: Renee Harder MD  REFERRING DIAG: Closed fracture of proximal right humerus   THERAPY DIAG:  Acute pain of right  shoulder  Muscle weakness (generalized)  Closed fracture of proximal end of right humerus with routine healing, unspecified fracture morphology, subsequent encounter  Rationale for Evaluation and Treatment Rehabilitation  ONSET DATE: 01/30/22  SUBJECTIVE:                                                                                                                                                                                      SUBJECTIVE STATEMENT: Pt is s/p R proximal humerus fracture on 01/30/22 for conservative management. Pt has been doing her HEP, and is able to roll briefly onto her L shoulder while  sleeping. 0/10 pain prior to tx. Pt has pain while dressing herself, and is not able to fully independently don/doff a shirt yet. Pt stated that she has pain in her R low back, with numbness down her LLE and L low back but is chronic pain she has dealt with for years. Pt currently sleeps in adjustable bed, with the ability to raise the head/feet of the bed. Has been compliant with HEP reporting shoulder is moving better with less pain with mobility.   PERTINENT HISTORY: Pt presenting with R shoulder pain s/p non operative proximal humerus fracture. Pt reports having a tent fall on her leading her to fall to the ground on her R side. Pt here with non-operative PT protocol. Reports orthopedic MD planning for 6-8 weeks of donning sling. Pt has history of R sided RTC tears and orthopedic MD stating return to full AROM in RUE may not be feasible. No MRI for RTC tissue quality as pt reports MD wants to see how pt manages with AROM with fracture recovery. Pt is R handed. Currently receiving HH aide for 4 hours in the morning to assist ADL's primarily dressing. No longer requiring pain meds except Tylenol PRN. Pain vastly has improved, worst pain nowadays is a 4/10 NPS sporadically. Has been working on elbow extension in recliner, some numbness in hand at night palmar and dorsal. Rated as moderate,  unsure of time of numbness.   PAIN:  Are you having pain? Yes: NPRS scale: 0/10 Pain location: R shoulder Pain description: Dull/achey Aggravating factors: RUE movement Relieving factors: Rest  PRECAUTIONS: See protocol in chart  WEIGHT BEARING RESTRICTIONS Yes WBAT on RUE  FALLS:  Has patient fallen in last 6 months? No  LIVING ENVIRONMENT: Lives with: lives with their family and lives alone Lives in: House/apartment Stairs: Yes: External: 3 steps; on right going up Has following equipment at home: None  OCCUPATION: Retired  PLOF: [place] motion/strength. Return to full independence.    OBJECTIVE:   DIAGNOSTIC FINDINGS:  XR Shoulder 3 Or More Views Right  Final Result   Acute, comminuted and anteriorly displaced fracture of the right proximal humerus with involvement of the surgical and anatomic necks and fragmentation of the greater tuberosity. Large fracture fragment containing the majority of the glenoid articular surface is rotated laterally.   PATIENT SURVEYS:  FOTO 19 with target of 41  COGNITION: Overall cognitive status: Within functional limits for tasks assessed     SENSATION: WFL  POSTURE: Upper trap activation most likely due to wearing sling. Overall WNL.    CERVICAL AROM:  Flexion: 50 degrees Extension: 45 degrees Rotation R/L: 50/45 degrees Lateral flexion (R/L): 30/35 degrees   UPPER EXTREMITY ROM:   Active ROM Right eval Left eval  Shoulder flexion PROM 74 Full  Shoulder extension    Shoulder abduction PROM 78 Full  Shoulder adduction    Shoulder internal rotation Deferred til week 6 per protocol Full  Shoulder external rotation 16 Full  Elbow flexion 129 Full  Elbow extension 30 from full ext 0 degrees  Wrist flexion    Wrist extension    Wrist ulnar deviation    Wrist radial deviation    Wrist pronation    Wrist supination    (Blank rows = not tested)  UPPER EXTREMITY MMT:    RUE  deferred due to NWB status and in sling MMT Right eval Left eval  Shoulder flexion  5/5  Shoulder extension    Shoulder  abduction  5/5  Shoulder adduction    Shoulder internal rotation  5/5  Shoulder external rotation  5/5  Middle trapezius    Lower trapezius    Elbow flexion  5/5  Elbow extension  5/5  Wrist flexion  5/5  Wrist extension  5/5  Wrist ulnar deviation    Wrist radial deviation    Wrist pronation    Wrist supination    Grip strength (lbs) 30.1 lbs (avg 2 trials) 36.5 lbs (avg 2 trials)  (Blank rows = not tested)   PALPATION: TTP along proximal humerus of RUE.     04/02/22:  R shoulder A/PROM in supine: flexion (94 deg.), abduction (92 deg.), ER (33 deg.), IR (78 deg.).  Supine R shoulder PROM flexion 124 deg. (Pain).  Standing R shoulder AROM: flexion (85 deg.), abduction (82 deg.).    04/17/22:  Yellow bulb grasping on R: 2 psi.  Handheld dynamometer: L 56.2#/ R 35.7#.     TODAY'S TREATMENT (04/23/22)  SUBJECTIVE:   Pt had a good trip to [place] and states she was able to sleep a couple nights without shoulder pain and not waking up.  Pt. Reports 1-2/10 R shoulder discomfort/ pain after taking 1,000 mg of extra strength Tylenol.       PAIN: 1-2/10 prior to tx. Session.  Pt. Arrived to PT without use of sling.  Pt.has returned to driving.   Ther-ex.:   04/23/22:    B UBE 3.5 min. F/b (consistent cadence)- discussed trip to [place].     Standing wall ladder with R shoulder flexion 5x.    Seated shoulder pulley: flexion/ abduction to >90 deg. With short static holds as tolerated 20x each.  Cuing to keep R elbow extended.     Seated wand AAROM: shoulder flexion/ chest press AA/PROM in chair (moderate PT assist).  Standing shoulder extension/ IR/ ER 10x each.    Nautilus: standing 40# lat. Pull downs with static shoulder flexion stretches 20x (as tolerated).  Standing 20# tricep ext./ sh. Extension/ 30# scap. retraction With bar 20x.  Pain tolerable range.    Supine R shoulder 2# ex.: bicep curls/ serratus punches/ chest press/ IR/ ER 10x2 each.      Manual tx.:  Seated R shoulder PROM (flexion/ scaption/ abduction)- 10x each with PT assist.  STM to R shoulder/ UT region.  R shoulder muscle energy technique with IR/ER (increase ROM)  Supine PROM: shoulder flexion/ abduction to 90+ deg. (pain with end-range flexion).     Pt. Will ice at home (as needed).       PATIENT EDUCATION: Education details: form/technique with exercise and updated HEP Person educated: Patient Education method: Explanation, Demonstration, Tactile cues, Verbal cues, and Handouts Education comprehension: verbalized understanding, returned demonstration, and needs further education   HOME EXERCISE PROGRAM: Access Code: [email or web address]  Access Code: [email or web address] URL: [email or web address] Date: 03/12/2022 Prepared by: Dorcas Carrow   Exercises - Seated Shoulder Flexion AAROM with Pulley Behind  - 2 x daily - 7 x weekly - 2 sets - 10 reps - Seated Shoulder Scaption AAROM with Pulley at Side  - 2 x daily - 7 x weekly - 2 sets - 10 reps - Supine Shoulder Press with Dowel  - 1 x daily - 7 x weekly - 3 sets - 10 reps   ASSESSMENT:  CLINICAL IMPRESSION: R shoulder joint hypomobility with all flexion/ abduction/ ER in seated/supine positions.  Moderate R UT muscle tightness/ overcompensation during A/AROM in seated and standing posture.  Cuing  to encourage full R elbow extension with shoulder A/PROM during tx.. No shoulder mobs. At this time until full healing of bone documented.  She will benefit from skilled PT services to continue to address her decreased ROM and strength in RUE in order to optimize strength and function of RUE for return to full independence in all activities.    OBJECTIVE IMPAIRMENTS decreased mobility, decreased ROM, decreased strength, hypomobility, impaired flexibility, improper body mechanics, postural dysfunction, and pain.   ACTIVITY  LIMITATIONS carrying, lifting, bathing, toileting, dressing, reach over head, and hygiene/grooming  PARTICIPATION LIMITATIONS: cleaning, driving, shopping, community activity, occupation, and yard work  PERSONAL FACTORS Age, Behavior pattern, Past/current experiences, Time since onset of injury/illness/exacerbation, Transportation, and 3+ comorbidities: HLD, lumbar DDD, OA of cervical spine, scoliosis  are also affecting patient's functional outcome.   REHAB POTENTIAL: Fair History of R sided RTC tears  CLINICAL DECISION MAKING: Evolving/moderate complexity  EVALUATION COMPLEXITY: Moderate   GOALS: Goals reviewed with patient? No  SHORT TERM GOALS: Target date: 04/14/2022 unless otherwise stated below  Pt will be independent with HEP to improve R shoulder mobility and strength for ADL completion Baseline: Initiated and given Goal status: Partially met  2.  Pt will improve R shoulder PROM to 90 degrees per protocol in prep for progression of AAROM of R shoulder. Baseline: 74 degrees.  R shoulder 95 deg.  Goal status: Goal met Target date: 04/14/2022   3.  Pt will improve R elbow extension to 0 degrees to prevent elbow contracture for ease of RUE ADL completion.  Baseline: Lacking 30 degrees from terminal extension Goal status: Partially met Target date: 04/14/2022    LONG TERM GOALS: Target date: 05/26/2022 unless otherwise posted  Pt will display full PROM by 8 weeks to demonstrate full joint mobility Baseline: Limited in flexion, ER, Abduction Goal status: INITIAL Target Date: 04/28/2022  2.  Pt will improve FOTO to target score to demonstrate clinically significant improvement in functional mobility of RUE Baseline: 19 with target score of 55 Goal status: INITIAL  3.  Pt will demonstrate at least 3/5 strength in RUE in flexion and abduction for overhead ADL completion Baseline: unable to test currently Goal status: INITIAL  4.  Pt will demonstrate > 120 degrees of RUE  shoulder flexion to demonstrate adequate mobility to complete needed overhead ADL tasks  Baseline: PROM of 74 degrees flexion, 78 PROM abduction Goal status: INITIAL    PLAN: PT FREQUENCY: 2x/week  PT DURATION: 12 weeks  PLANNED INTERVENTIONS: Therapeutic exercises, Therapeutic activity, Neuromuscular re-education, Patient/Family education, Self Care, Joint mobilization, Dry Needling, Electrical stimulation, Spinal mobilization, Cryotherapy, Moist heat, and Manual therapy  PLAN FOR NEXT SESSION: Progress R shoulder mobility per protocol - further shoulder and elbow PROM and AAROM. Periscap strengthening, cervical and elbow mobility.     Pura Spice, PT, DPT # [phone] Physical Therapist- [facility]  04/23/2022, 9:50 AM

## 2022-04-28 ENCOUNTER — Encounter: Payer: Self-pay | Admitting: Physical Therapy

## 2022-04-28 ENCOUNTER — Ambulatory Visit: Payer: Medicare HMO | Admitting: Physical Therapy

## 2022-04-28 DIAGNOSIS — S42201D Unspecified fracture of upper end of right humerus, subsequent encounter for fracture with routine healing: Secondary | ICD-10-CM | POA: Diagnosis not present

## 2022-04-28 DIAGNOSIS — M6281 Muscle weakness (generalized): Secondary | ICD-10-CM | POA: Diagnosis not present

## 2022-04-28 DIAGNOSIS — M25511 Pain in right shoulder: Secondary | ICD-10-CM

## 2022-04-28 NOTE — Therapy (Signed)
OUTPATIENT PHYSICAL THERAPY SHOULDER TREATMENT  Patient Name: Jennifer Morrow MRN: [id number] DOB:September 06, 1950, 71 y.o., female Today's Date: 04/28/2022   PT End of Session - 04/28/22 0949     Visit Number 14    Number of Visits 25    Date for PT Re-Evaluation 05/26/22    PT Start Time 0944    PT Stop Time 1031    PT Time Calculation (min) 47 min    Activity Tolerance Patient tolerated treatment well;Patient limited by pain    Behavior During Therapy [facility] for tasks assessed/performed              Past Medical History:  Diagnosis Date   Arthritis    Bronchitis    CHRONIC   Bulging lumbar disc    degenerative dics   Scoliosis    LIMITS LUNG FUNCTION   Spinal stenosis    Past Surgical History:  Procedure Laterality Date   CATARACT EXTRACTION W/PHACO Left 01/06/2018   Procedure: CATARACT EXTRACTION PHACO AND INTRAOCULAR LENS PLACEMENT ([place]);  Surgeon: Eulogio Bear, MD;  Location: [facility] ORS;  Service: Ophthalmology;  Laterality: Left;  [place] 00:27.5 AP% 4.3 CDE 1.18 FLUID PACK LOT # [id number]   CATARACT EXTRACTION W/PHACO Right 02/10/2018   Procedure: CATARACT EXTRACTION PHACO AND INTRAOCULAR LENS PLACEMENT ([facility]);  Surgeon: Eulogio Bear, MD;  Location: [facility] ORS;  Service: Ophthalmology;  Laterality: Right;  [place] 00:30.5 AP% 5.0 CDE 1.51  Fluid pack lot # [id number]   FOOT X 2     HEEL SPUR EXCISION     TONSILLECTOMY     Patient Active Problem List   Diagnosis Date Noted   Osteoarthritis cervical spine 01/14/2022   Age-related osteoporosis without current pathological fracture 05/19/2021   Spondylosis of lumbar region without myelopathy or radiculopathy 05/19/2021   Degenerative disc disease, lumbar 05/19/2021   Scoliosis, unspecified 01/09/2015   Vitamin D deficiency 01/08/2014   Mixed hyperlipidemia 01/08/2014    PCP: Halina Maidens MD  REFERRING PROVIDER: Renee Harder MD  REFERRING DIAG: Closed fracture of proximal right humerus   THERAPY DIAG:   Acute pain of right shoulder  Muscle weakness (generalized)  Closed fracture of proximal end of right humerus with routine healing, unspecified fracture morphology, subsequent encounter  Rationale for Evaluation and Treatment Rehabilitation  ONSET DATE: 01/30/22  SUBJECTIVE:                                                                                                                                                                                      SUBJECTIVE STATEMENT: Pt is s/p R proximal humerus fracture on 01/30/22 for conservative management. Pt has been doing her HEP, and  is able to roll briefly onto her L shoulder while sleeping. 0/10 pain prior to tx. Pt has pain while dressing herself, and is not able to fully independently don/doff a shirt yet. Pt stated that she has pain in her R low back, with numbness down her LLE and L low back but is chronic pain she has dealt with for years. Pt currently sleeps in adjustable bed, with the ability to raise the head/feet of the bed. Has been compliant with HEP reporting shoulder is moving better with less pain with mobility.   PERTINENT HISTORY: Pt presenting with R shoulder pain s/p non operative proximal humerus fracture. Pt reports having a tent fall on her leading her to fall to the ground on her R side. Pt here with non-operative PT protocol. Reports orthopedic MD planning for 6-8 weeks of donning sling. Pt has history of R sided RTC tears and orthopedic MD stating return to full AROM in RUE may not be feasible. No MRI for RTC tissue quality as pt reports MD wants to see how pt manages with AROM with fracture recovery. Pt is R handed. Currently receiving HH aide for 4 hours in the morning to assist ADL's primarily dressing. No longer requiring pain meds except Tylenol PRN. Pain vastly has improved, worst pain nowadays is a 4/10 NPS sporadically. Has been working on elbow extension in recliner, some numbness in hand at night palmar and dorsal.  Rated as moderate, unsure of time of numbness.   PAIN:  Are you having pain? Yes: NPRS scale: 0/10 Pain location: R shoulder Pain description: Dull/achey Aggravating factors: RUE movement Relieving factors: Rest  PRECAUTIONS: See protocol in chart  WEIGHT BEARING RESTRICTIONS Yes WBAT on RUE  FALLS:  Has patient fallen in last 6 months? No  LIVING ENVIRONMENT: Lives with: lives with their family and lives alone Lives in: House/apartment Stairs: Yes: External: 3 steps; on right going up Has following equipment at home: None  OCCUPATION: Retired  PLOF: [employer] motion/strength. Return to full independence.    OBJECTIVE:   DIAGNOSTIC FINDINGS:  XR Shoulder 3 Or More Views Right  Final Result   Acute, comminuted and anteriorly displaced fracture of the right proximal humerus with involvement of the surgical and anatomic necks and fragmentation of the greater tuberosity. Large fracture fragment containing the majority of the glenoid articular surface is rotated laterally.   PATIENT SURVEYS:  FOTO 19 with target of 55  COGNITION: Overall cognitive status: Within functional limits for tasks assessed     SENSATION: WFL  POSTURE: Upper trap activation most likely due to wearing sling. Overall WNL.    CERVICAL AROM:  Flexion: 50 degrees Extension: 45 degrees Rotation R/L: 50/45 degrees Lateral flexion (R/L): 30/35 degrees   UPPER EXTREMITY ROM:   Active ROM Right eval Left eval  Shoulder flexion PROM 74 Full  Shoulder extension    Shoulder abduction PROM 78 Full  Shoulder adduction    Shoulder internal rotation Deferred til week 6 per protocol Full  Shoulder external rotation 16 Full  Elbow flexion 129 Full  Elbow extension 30 from full ext 0 degrees  Wrist flexion    Wrist extension    Wrist ulnar deviation    Wrist radial deviation    Wrist pronation    Wrist supination    (Blank rows = not tested)  UPPER EXTREMITY  MMT:    RUE deferred due to NWB status and in sling MMT Right eval Left eval  Shoulder  flexion  5/5  Shoulder extension    Shoulder abduction  5/5  Shoulder adduction    Shoulder internal rotation  5/5  Shoulder external rotation  5/5  Middle trapezius    Lower trapezius    Elbow flexion  5/5  Elbow extension  5/5  Wrist flexion  5/5  Wrist extension  5/5  Wrist ulnar deviation    Wrist radial deviation    Wrist pronation    Wrist supination    Grip strength (lbs) 30.1 lbs (avg 2 trials) 36.5 lbs (avg 2 trials)  (Blank rows = not tested)   PALPATION: TTP along proximal humerus of RUE.     04/02/22:  R shoulder A/PROM in supine: flexion (94 deg.), abduction (92 deg.), ER (33 deg.), IR (78 deg.).  Supine R shoulder PROM flexion 124 deg. (Pain).  Standing R shoulder AROM: flexion (85 deg.), abduction (82 deg.).    04/17/22:  Yellow bulb grasping on R: 2 psi.  Handheld dynamometer: L 56.2#/ R 35.7#.     TODAY'S TREATMENT (04/28/22)  SUBJECTIVE:   Pt. Reports continued 1-2/10 R shoulder discomfort/ pain after taking 1,000 mg of extra strength Tylenol this morning.  Pt. Has noticed more "popping" in R shoulder with daily tasks.  Pt. Is heading to [place] on Friday 9/22 to see grand kids for a couple weeks.      PAIN: 1-2/10 prior to tx. Session.  Pt. Arrived to PT without use of sling.  Pt.has returned to driving.   Ther-ex.:   04/28/22:    B UBE 3.5 min. F/b (consistent cadence).  Standing wall ladder with R shoulder flexion 5x.    Seated wand AAROM: shoulder flexion/ chest press AA/PROM in chair (minimal to moderate PT assist).  Standing shoulder extension/ IR/ ER / abduction 10x each with mirror feedback.    Supine R shoulder 2# ex.: bicep curls/ chest press/ tricep extension 10x2 each.   Supine R shoulder horizontal abduction/ adduction 10x each with static holds as tolerated.     No Nautilus today  Manual tx.:  Supine R shoulder AA/PROM (flexion/ scaption/  abduction)- 10x each with PT assist.  STM to R shoulder/ UT region.    STM to R proximal biceps/ deltoid/ UT region in supine position.      Pt. Will ice at home (as needed).       PATIENT EDUCATION: Education details: form/technique with exercise and updated HEP Person educated: Patient Education method: Explanation, Demonstration, Tactile cues, Verbal cues, and Handouts Education comprehension: verbalized understanding, returned demonstration, and needs further education   HOME EXERCISE PROGRAM: Access Code: [email or web address]  Access Code: [email or web address] URL: [email or web address] Date: 03/12/2022 Prepared by: Dorcas Carrow   Exercises - Seated Shoulder Flexion AAROM with Pulley Behind  - 2 x daily - 7 x weekly - 2 sets - 10 reps - Seated Shoulder Scaption AAROM with Pulley at Side  - 2 x daily - 7 x weekly - 2 sets - 10 reps - Supine Shoulder Press with Dowel  - 1 x daily - 7 x weekly - 3 sets - 10 reps   ASSESSMENT:  CLINICAL IMPRESSION: R shoulder joint hypomobility with all flexion/ abduction/ ER in seated/supine positions.  Moderate R UT muscle tightness/ pain during A/AROM in seated and standing posture.  Cuing to encourage full R elbow extension with shoulder A/PROM during tx.. No shoulder mobs. At this time until full healing of bone documented.  She will benefit from skilled PT services to continue to address her  decreased ROM and strength in RUE in order to optimize strength and function of RUE for return to full independence in all activities.    OBJECTIVE IMPAIRMENTS decreased mobility, decreased ROM, decreased strength, hypomobility, impaired flexibility, improper body mechanics, postural dysfunction, and pain.   ACTIVITY LIMITATIONS carrying, lifting, bathing, toileting, dressing, reach over head, and hygiene/grooming  PARTICIPATION LIMITATIONS: cleaning, driving, shopping, community activity, occupation, and yard work  PERSONAL FACTORS Age, Behavior pattern,  Past/current experiences, Time since onset of injury/illness/exacerbation, Transportation, and 3+ comorbidities: HLD, lumbar DDD, OA of cervical spine, scoliosis  are also affecting patient's functional outcome.   REHAB POTENTIAL: Fair History of R sided RTC tears  CLINICAL DECISION MAKING: Evolving/moderate complexity  EVALUATION COMPLEXITY: Moderate   GOALS: Goals reviewed with patient? No  SHORT TERM GOALS: Target date: 04/14/2022 unless otherwise stated below  Pt will be independent with HEP to improve R shoulder mobility and strength for ADL completion Baseline: Initiated and given Goal status: Partially met  2.  Pt will improve R shoulder PROM to 90 degrees per protocol in prep for progression of AAROM of R shoulder. Baseline: 74 degrees.  R shoulder 95 deg.  Goal status: Goal met Target date: 04/14/2022   3.  Pt will improve R elbow extension to 0 degrees to prevent elbow contracture for ease of RUE ADL completion.  Baseline: Lacking 30 degrees from terminal extension Goal status: Partially met Target date: 04/14/2022    LONG TERM GOALS: Target date: 05/26/2022 unless otherwise posted  Pt will display full PROM by 8 weeks to demonstrate full joint mobility Baseline: Limited in flexion, ER, Abduction Goal status: Not met Target Date: 05/26/2022  2.  Pt will improve FOTO to target score to demonstrate clinically significant improvement in functional mobility of RUE Baseline: 19 with target score of 55 Goal status: Ongoing  3.  Pt will demonstrate at least 3/5 strength in RUE in flexion and abduction for overhead ADL completion Baseline: unable to test currently Goal status: Not met  4.  Pt will demonstrate > 120 degrees of RUE shoulder flexion to demonstrate adequate mobility to complete needed overhead ADL tasks  Baseline: PROM of 74 degrees flexion, 78 PROM abduction Goal status: INITIAL    PLAN: PT FREQUENCY: 2x/week  PT DURATION: 12 weeks  PLANNED  INTERVENTIONS: Therapeutic exercises, Therapeutic activity, Neuromuscular re-education, Patient/Family education, Self Care, Joint mobilization, Dry Needling, Electrical stimulation, Spinal mobilization, Cryotherapy, Moist heat, and Manual therapy  PLAN FOR NEXT SESSION: Progress R shoulder mobility per protocol - further shoulder and elbow PROM and AAROM. Periscap strengthening, cervical and elbow mobility.     Pura Spice, PT, DPT # [phone] Physical Therapist- [facility]  04/28/2022, 11:54 AM

## 2022-04-30 ENCOUNTER — Ambulatory Visit: Payer: Medicare HMO | Admitting: Physical Therapy

## 2022-04-30 ENCOUNTER — Encounter: Payer: Self-pay | Admitting: Physical Therapy

## 2022-04-30 DIAGNOSIS — S42201D Unspecified fracture of upper end of right humerus, subsequent encounter for fracture with routine healing: Secondary | ICD-10-CM

## 2022-04-30 DIAGNOSIS — M25511 Pain in right shoulder: Secondary | ICD-10-CM | POA: Diagnosis not present

## 2022-04-30 DIAGNOSIS — M6281 Muscle weakness (generalized): Secondary | ICD-10-CM

## 2022-04-30 NOTE — Therapy (Signed)
OUTPATIENT PHYSICAL THERAPY SHOULDER TREATMENT  Patient Name: Jennifer Morrow MRN: [id number] DOB:1951-02-17, 71 y.o., female Today's Date: 05/01/2022   PT End of Session - 04/30/22 0730     Visit Number 15    Number of Visits 25    Date for PT Re-Evaluation 05/26/22    PT Start Time 0730    PT Stop Time 0817    PT Time Calculation (min) 47 min    Activity Tolerance Patient tolerated treatment well;Patient limited by pain    Behavior During Therapy [facility] for tasks assessed/performed              Past Medical History:  Diagnosis Date   Arthritis    Bronchitis    CHRONIC   Bulging lumbar disc    degenerative dics   Scoliosis    LIMITS LUNG FUNCTION   Spinal stenosis    Past Surgical History:  Procedure Laterality Date   CATARACT EXTRACTION W/PHACO Left 01/06/2018   Procedure: CATARACT EXTRACTION PHACO AND INTRAOCULAR LENS PLACEMENT ([place]);  Surgeon: Eulogio Bear, MD;  Location: [facility] ORS;  Service: Ophthalmology;  Laterality: Left;  [place] 00:27.5 AP% 4.3 CDE 1.18 FLUID PACK LOT # [id number]   CATARACT EXTRACTION W/PHACO Right 02/10/2018   Procedure: CATARACT EXTRACTION PHACO AND INTRAOCULAR LENS PLACEMENT ([facility]);  Surgeon: Eulogio Bear, MD;  Location: [facility] ORS;  Service: Ophthalmology;  Laterality: Right;  [place] 00:30.5 AP% 5.0 CDE 1.51  Fluid pack lot # [id number]   FOOT X 2     HEEL SPUR EXCISION     TONSILLECTOMY     Patient Active Problem List   Diagnosis Date Noted   Osteoarthritis cervical spine 01/14/2022   Age-related osteoporosis without current pathological fracture 05/19/2021   Spondylosis of lumbar region without myelopathy or radiculopathy 05/19/2021   Degenerative disc disease, lumbar 05/19/2021   Scoliosis, unspecified 01/09/2015   Vitamin D deficiency 01/08/2014   Mixed hyperlipidemia 01/08/2014    PCP: Halina Maidens MD  REFERRING PROVIDER: Renee Harder MD  REFERRING DIAG: Closed fracture of proximal right humerus   THERAPY DIAG:   Acute pain of right shoulder  Muscle weakness (generalized)  Closed fracture of proximal end of right humerus with routine healing, unspecified fracture morphology, subsequent encounter  Rationale for Evaluation and Treatment Rehabilitation  ONSET DATE: 01/30/22  SUBJECTIVE:                                                                                                                                                                                      SUBJECTIVE STATEMENT: Pt is s/p R proximal humerus fracture on 01/30/22 for conservative management. Pt has been doing her HEP, and  is able to roll briefly onto her L shoulder while sleeping. 0/10 pain prior to tx. Pt has pain while dressing herself, and is not able to fully independently don/doff a shirt yet. Pt stated that she has pain in her R low back, with numbness down her LLE and L low back but is chronic pain she has dealt with for years. Pt currently sleeps in adjustable bed, with the ability to raise the head/feet of the bed. Has been compliant with HEP reporting shoulder is moving better with less pain with mobility.   PERTINENT HISTORY: Pt presenting with R shoulder pain s/p non operative proximal humerus fracture. Pt reports having a tent fall on her leading her to fall to the ground on her R side. Pt here with non-operative PT protocol. Reports orthopedic MD planning for 6-8 weeks of donning sling. Pt has history of R sided RTC tears and orthopedic MD stating return to full AROM in RUE may not be feasible. No MRI for RTC tissue quality as pt reports MD wants to see how pt manages with AROM with fracture recovery. Pt is R handed. Currently receiving HH aide for 4 hours in the morning to assist ADL's primarily dressing. No longer requiring pain meds except Tylenol PRN. Pain vastly has improved, worst pain nowadays is a 4/10 NPS sporadically. Has been working on elbow extension in recliner, some numbness in hand at night palmar and dorsal.  Rated as moderate, unsure of time of numbness.   PAIN:  Are you having pain? Yes: NPRS scale: 0/10 Pain location: R shoulder Pain description: Dull/achey Aggravating factors: RUE movement Relieving factors: Rest  PRECAUTIONS: See protocol in chart  WEIGHT BEARING RESTRICTIONS Yes WBAT on RUE  FALLS:  Has patient fallen in last 6 months? No  LIVING ENVIRONMENT: Lives with: lives with their family and lives alone Lives in: House/apartment Stairs: Yes: External: 3 steps; on right going up Has following equipment at home: None  OCCUPATION: Retired  PLOF: [employer] motion/strength. Return to full independence.    OBJECTIVE:   DIAGNOSTIC FINDINGS:  XR Shoulder 3 Or More Views Right  Final Result   Acute, comminuted and anteriorly displaced fracture of the right proximal humerus with involvement of the surgical and anatomic necks and fragmentation of the greater tuberosity. Large fracture fragment containing the majority of the glenoid articular surface is rotated laterally.   PATIENT SURVEYS:  FOTO 19 with target of 55  COGNITION: Overall cognitive status: Within functional limits for tasks assessed     SENSATION: WFL  POSTURE: Upper trap activation most likely due to wearing sling. Overall WNL.    CERVICAL AROM:  Flexion: 50 degrees Extension: 45 degrees Rotation R/L: 50/45 degrees Lateral flexion (R/L): 30/35 degrees   UPPER EXTREMITY ROM:   Active ROM Right eval Left eval  Shoulder flexion PROM 74 Full  Shoulder extension    Shoulder abduction PROM 78 Full  Shoulder adduction    Shoulder internal rotation Deferred til week 6 per protocol Full  Shoulder external rotation 16 Full  Elbow flexion 129 Full  Elbow extension 30 from full ext 0 degrees  Wrist flexion    Wrist extension    Wrist ulnar deviation    Wrist radial deviation    Wrist pronation    Wrist supination    (Blank rows = not tested)  UPPER EXTREMITY  MMT:    RUE deferred due to NWB status and in sling MMT Right eval Left eval  Shoulder  flexion  5/5  Shoulder extension    Shoulder abduction  5/5  Shoulder adduction    Shoulder internal rotation  5/5  Shoulder external rotation  5/5  Middle trapezius    Lower trapezius    Elbow flexion  5/5  Elbow extension  5/5  Wrist flexion  5/5  Wrist extension  5/5  Wrist ulnar deviation    Wrist radial deviation    Wrist pronation    Wrist supination    Grip strength (lbs) 30.1 lbs (avg 2 trials) 36.5 lbs (avg 2 trials)  (Blank rows = not tested)   PALPATION: TTP along proximal humerus of RUE.     04/02/22:  R shoulder A/PROM in supine: flexion (94 deg.), abduction (92 deg.), ER (33 deg.), IR (78 deg.).  Supine R shoulder PROM flexion 124 deg. (Pain).  Standing R shoulder AROM: flexion (85 deg.), abduction (82 deg.).    04/17/22:  Yellow bulb grasping on R: 2 psi.  Handheld dynamometer: L 56.2#/ R 35.7#.     TODAY'S TREATMENT (04/30/22)  SUBJECTIVE:   Pt. States she was sore/ swollen in R shoulder after last tx. Session.  Pt. Used ice and is doing better today/ "improving".  Pt. Is heading to [place] on Friday 9/22 to see grand kids for a couple weeks.      PAIN: 1-2/10 prior to tx. Session.  Pt. Arrived to PT without use of sling.  Pt.has returned to driving.   Ther-ex.:   04/30/22:    B UBE 4 min. F/b (consistent cadence).  Standing wand AAROM: shoulder flexion/ chest press/ extension/ IR/ ER/ abduction AA/PROM in chair (minimal to moderate PT assist).  10x each with mirror feedback.    Standing B shoulder 2# ex.: bicep curls/ chest press/ tricep extension 10x2 each.       Nautilus:  50# seated lat. Pull downs/ 30# standing tricep extension/   Manual tx.:  Supine R shoulder AA/PROM (flexion/ scaption/ abduction/ ER)- 10x each with PT assist.  STM to R shoulder/ UT region.    STM to R proximal biceps/ deltoid/ UT region in supine position.      Pt. Will ice at home  (as needed).       PATIENT EDUCATION: Education details: form/technique with exercise and updated HEP Person educated: Patient Education method: Explanation, Demonstration, Tactile cues, Verbal cues, and Handouts Education comprehension: verbalized understanding, returned demonstration, and needs further education   HOME EXERCISE PROGRAM: Access Code: [email or web address]  Access Code: [email or web address] URL: [email or web address] Date: 03/12/2022 Prepared by: Dorcas Carrow   Exercises - Seated Shoulder Flexion AAROM with Pulley Behind  - 2 x daily - 7 x weekly - 2 sets - 10 reps - Seated Shoulder Scaption AAROM with Pulley at Side  - 2 x daily - 7 x weekly - 2 sets - 10 reps - Supine Shoulder Press with Dowel  - 1 x daily - 7 x weekly - 3 sets - 10 reps   ASSESSMENT:  CLINICAL IMPRESSION: R shoulder joint hypomobility with all flexion/ abduction/ ER in seated/supine positions.  Moderate R UT muscle tightness/ pain during A/AROM in seated and standing posture. Pt. Sore in R shoulder after manual stretches in supine/ seated posture.  No shoulder mobs. At this time until full healing of bone documented.  She will benefit from skilled PT services to continue to address her decreased ROM and strength in RUE in order to optimize strength and function of RUE for return to full independence in all activities.  OBJECTIVE IMPAIRMENTS decreased mobility, decreased ROM, decreased strength, hypomobility, impaired flexibility, improper body mechanics, postural dysfunction, and pain.   ACTIVITY LIMITATIONS carrying, lifting, bathing, toileting, dressing, reach over head, and hygiene/grooming  PARTICIPATION LIMITATIONS: cleaning, driving, shopping, community activity, occupation, and yard work  PERSONAL FACTORS Age, Behavior pattern, Past/current experiences, Time since onset of injury/illness/exacerbation, Transportation, and 3+ comorbidities: HLD, lumbar DDD, OA of cervical spine, scoliosis  are  also affecting patient's functional outcome.   REHAB POTENTIAL: Fair History of R sided RTC tears  CLINICAL DECISION MAKING: Evolving/moderate complexity  EVALUATION COMPLEXITY: Moderate   GOALS: Goals reviewed with patient? No  SHORT TERM GOALS: Target date: 04/14/2022 unless otherwise stated below  Pt will be independent with HEP to improve R shoulder mobility and strength for ADL completion Baseline: Initiated and given Goal status: Partially met  2.  Pt will improve R shoulder PROM to 90 degrees per protocol in prep for progression of AAROM of R shoulder. Baseline: 74 degrees.  R shoulder 95 deg.  Goal status: Goal met Target date: 04/14/2022   3.  Pt will improve R elbow extension to 0 degrees to prevent elbow contracture for ease of RUE ADL completion.  Baseline: Lacking 30 degrees from terminal extension Goal status: Partially met Target date: 04/14/2022    LONG TERM GOALS: Target date: 05/26/2022 unless otherwise posted  Pt will display full PROM by 8 weeks to demonstrate full joint mobility Baseline: Limited in flexion, ER, Abduction Goal status: Not met Target Date: 05/26/2022  2.  Pt will improve FOTO to target score to demonstrate clinically significant improvement in functional mobility of RUE Baseline: 19 with target score of 55 Goal status: Ongoing  3.  Pt will demonstrate at least 3/5 strength in RUE in flexion and abduction for overhead ADL completion Baseline: unable to test currently Goal status: Not met  4.  Pt will demonstrate > 120 degrees of RUE shoulder flexion to demonstrate adequate mobility to complete needed overhead ADL tasks  Baseline: PROM of 74 degrees flexion, 78 PROM abduction Goal status: INITIAL    PLAN: PT FREQUENCY: 2x/week  PT DURATION: 12 weeks  PLANNED INTERVENTIONS: Therapeutic exercises, Therapeutic activity, Neuromuscular re-education, Patient/Family education, Self Care, Joint mobilization, Dry Needling, Electrical  stimulation, Spinal mobilization, Cryotherapy, Moist heat, and Manual therapy  PLAN FOR NEXT SESSION: Progress R shoulder mobility per protocol - further shoulder and elbow PROM and AAROM. Periscap strengthening, cervical and elbow mobility.     Pura Spice, PT, DPT # [phone] Physical Therapist- [facility]  05/01/2022, 11:00 AM

## 2022-05-05 ENCOUNTER — Encounter: Payer: Self-pay | Admitting: Physical Therapy

## 2022-05-05 ENCOUNTER — Ambulatory Visit: Payer: Medicare HMO | Admitting: Physical Therapy

## 2022-05-05 DIAGNOSIS — M25511 Pain in right shoulder: Secondary | ICD-10-CM | POA: Diagnosis not present

## 2022-05-05 DIAGNOSIS — S42201D Unspecified fracture of upper end of right humerus, subsequent encounter for fracture with routine healing: Secondary | ICD-10-CM

## 2022-05-05 DIAGNOSIS — M6281 Muscle weakness (generalized): Secondary | ICD-10-CM

## 2022-05-05 NOTE — Therapy (Signed)
OUTPATIENT PHYSICAL THERAPY SHOULDER TREATMENT  Patient Name: Jennifer Morrow MRN: [id number] DOB:[birth year], 71 y.o., female Today's Date: 05/05/2022   PT End of Session - 05/05/22 0902     Visit Number 16    Number of Visits 25    Date for PT Re-Evaluation 05/26/22    PT Start Time 0857    PT Stop Time 0945    PT Time Calculation (min) 48 min    Activity Tolerance Patient tolerated treatment well;Patient limited by pain    Behavior During Therapy [facility] for tasks assessed/performed              Past Medical History:  Diagnosis Date   Arthritis    Bronchitis    CHRONIC   Bulging lumbar disc    degenerative dics   Scoliosis    LIMITS LUNG FUNCTION   Spinal stenosis    Past Surgical History:  Procedure Laterality Date   CATARACT EXTRACTION W/PHACO Left 01/06/2018   Procedure: CATARACT EXTRACTION PHACO AND INTRAOCULAR LENS PLACEMENT ([place]);  Surgeon: Eulogio Bear, MD;  Location: [facility] ORS;  Service: Ophthalmology;  Laterality: Left;  [place] 00:27.5 AP% 4.3 CDE 1.18 FLUID PACK LOT # [id number]   CATARACT EXTRACTION W/PHACO Right 02/10/2018   Procedure: CATARACT EXTRACTION PHACO AND INTRAOCULAR LENS PLACEMENT ([facility]);  Surgeon: Eulogio Bear, MD;  Location: [facility] ORS;  Service: Ophthalmology;  Laterality: Right;  [place] 00:30.5 AP% 5.0 CDE 1.51  Fluid pack lot # [id number]   FOOT X 2     HEEL SPUR EXCISION     TONSILLECTOMY     Patient Active Problem List   Diagnosis Date Noted   Osteoarthritis cervical spine 01/14/2022   Age-related osteoporosis without current pathological fracture 05/19/2021   Spondylosis of lumbar region without myelopathy or radiculopathy 05/19/2021   Degenerative disc disease, lumbar 05/19/2021   Scoliosis, unspecified 01/09/2015   Vitamin D deficiency 01/08/2014   Mixed hyperlipidemia 01/08/2014    PCP: Halina Maidens MD  REFERRING PROVIDER: Renee Harder MD  REFERRING DIAG: Closed fracture of proximal right humerus   THERAPY DIAG:   Acute pain of right shoulder  Muscle weakness (generalized)  Closed fracture of proximal end of right humerus with routine healing, unspecified fracture morphology, subsequent encounter  Rationale for Evaluation and Treatment Rehabilitation  ONSET DATE: 01/30/22  SUBJECTIVE:                                                                                                                                                                                      SUBJECTIVE STATEMENT: Pt is s/p R proximal humerus fracture on 01/30/22 for conservative management. Pt has been doing her HEP, and  is able to roll briefly onto her L shoulder while sleeping. 0/10 pain prior to tx. Pt has pain while dressing herself, and is not able to fully independently don/doff a shirt yet. Pt stated that she has pain in her R low back, with numbness down her LLE and L low back but is chronic pain she has dealt with for years. Pt currently sleeps in adjustable bed, with the ability to raise the head/feet of the bed. Has been compliant with HEP reporting shoulder is moving better with less pain with mobility.   PERTINENT HISTORY: Pt presenting with R shoulder pain s/p non operative proximal humerus fracture. Pt reports having a tent fall on her leading her to fall to the ground on her R side. Pt here with non-operative PT protocol. Reports orthopedic MD planning for 6-8 weeks of donning sling. Pt has history of R sided RTC tears and orthopedic MD stating return to full AROM in RUE may not be feasible. No MRI for RTC tissue quality as pt reports MD wants to see how pt manages with AROM with fracture recovery. Pt is R handed. Currently receiving HH aide for 4 hours in the morning to assist ADL's primarily dressing. No longer requiring pain meds except Tylenol PRN. Pain vastly has improved, worst pain nowadays is a 4/10 NPS sporadically. Has been working on elbow extension in recliner, some numbness in hand at night palmar and dorsal.  Rated as moderate, unsure of time of numbness.   PAIN:  Are you having pain? Yes: NPRS scale: 0/10 Pain location: R shoulder Pain description: Dull/achey Aggravating factors: RUE movement Relieving factors: Rest  PRECAUTIONS: See protocol in chart  WEIGHT BEARING RESTRICTIONS Yes WBAT on RUE  FALLS:  Has patient fallen in last 6 months? No  LIVING ENVIRONMENT: Lives with: lives with their family and lives alone Lives in: House/apartment Stairs: Yes: External: 3 steps; on right going up Has following equipment at home: None  OCCUPATION: Retired  PLOF: [employer] motion/strength. Return to full independence.    OBJECTIVE:   DIAGNOSTIC FINDINGS:  XR Shoulder 3 Or More Views Right  Final Result   Acute, comminuted and anteriorly displaced fracture of the right proximal humerus with involvement of the surgical and anatomic necks and fragmentation of the greater tuberosity. Large fracture fragment containing the majority of the glenoid articular surface is rotated laterally.   PATIENT SURVEYS:  FOTO 19 with target of 55  COGNITION: Overall cognitive status: Within functional limits for tasks assessed     SENSATION: WFL  POSTURE: Upper trap activation most likely due to wearing sling. Overall WNL.    CERVICAL AROM:  Flexion: 50 degrees Extension: 45 degrees Rotation R/L: 50/45 degrees Lateral flexion (R/L): 30/35 degrees   UPPER EXTREMITY ROM:   Active ROM Right eval Left eval  Shoulder flexion PROM 74 Full  Shoulder extension    Shoulder abduction PROM 78 Full  Shoulder adduction    Shoulder internal rotation Deferred til week 6 per protocol Full  Shoulder external rotation 16 Full  Elbow flexion 129 Full  Elbow extension 30 from full ext 0 degrees  Wrist flexion    Wrist extension    Wrist ulnar deviation    Wrist radial deviation    Wrist pronation    Wrist supination    (Blank rows = not tested)  UPPER EXTREMITY  MMT:    RUE deferred due to NWB status and in sling MMT Right eval Left eval  Shoulder  flexion  5/5  Shoulder extension    Shoulder abduction  5/5  Shoulder adduction    Shoulder internal rotation  5/5  Shoulder external rotation  5/5  Middle trapezius    Lower trapezius    Elbow flexion  5/5  Elbow extension  5/5  Wrist flexion  5/5  Wrist extension  5/5  Wrist ulnar deviation    Wrist radial deviation    Wrist pronation    Wrist supination    Grip strength (lbs) 30.1 lbs (avg 2 trials) 36.5 lbs (avg 2 trials)  (Blank rows = not tested)   PALPATION: TTP along proximal humerus of RUE.     04/02/22:  R shoulder A/PROM in supine: flexion (94 deg.), abduction (92 deg.), ER (33 deg.), IR (78 deg.).  Supine R shoulder PROM flexion 124 deg. (Pain).  Standing R shoulder AROM: flexion (85 deg.), abduction (82 deg.).    04/17/22:  Yellow bulb grasping on R: 2 psi.  Handheld dynamometer: L 56.2#/ R 35.7#.     TODAY'S TREATMENT (05/05/22)  SUBJECTIVE:   Pt. Is heading to [place] on Friday 9/22 to see grand kids for a couple weeks. Pt. Reports improvement with being able to lie on L side and reach over to turn off bedroom light.       PAIN: 1-2/10 prior to tx. Session.  Pt. Arrived to PT without use of sling.  Pt.has returned to driving.   Ther-ex.:   05/05/22:    B UBE 4 min. F/b (consistent cadence).   Standing wand AAROM: shoulder flexion/ chest press/ extension/ IR/ ER/ abduction AA/PROM in chair (minimal to moderate PT assist).  10x each with mirror feedback.    Standing green ball at stair handrail 20x (limited without L UE assist).  Nautilus:  30# seated lat. Pull downs/ 30# standing tricep extension/ 30# scapular retraction 20x each.    Reassessment of R elbow flexion/ extension strength.         Manual tx.:  Seated R shoulder AA/PROM (flexion/ scaption/ abduction)- 10x each with PT assist.  STM to R shoulder/ UT region.    STM to R proximal biceps/ deltoid/ UT  region in supine position.      Pt. Will ice at home (as needed).       PATIENT EDUCATION: Education details: form/technique with exercise and updated HEP Person educated: Patient Education method: Explanation, Demonstration, Tactile cues, Verbal cues, and Handouts Education comprehension: verbalized understanding, returned demonstration, and needs further education   HOME EXERCISE PROGRAM: Access Code: [email or web address]  Access Code: [email or web address] URL: [email or web address] Date: 03/12/2022 Prepared by: Dorcas Carrow   Exercises - Seated Shoulder Flexion AAROM with Pulley Behind  - 2 x daily - 7 x weekly - 2 sets - 10 reps - Seated Shoulder Scaption AAROM with Pulley at Side  - 2 x daily - 7 x weekly - 2 sets - 10 reps - Supine Shoulder Press with Dowel  - 1 x daily - 7 x weekly - 3 sets - 10 reps   ASSESSMENT:  CLINICAL IMPRESSION: Pt. Pain limited with R shoulder static stretches at Nautilus and during standing wand AA/PROM with PT assist.  Moderate R UT muscle tightness and R UT overcompensation with R shoulder reaching.  Pt. Sore in R shoulder after manual stretches in seated posture.  No shoulder mobs. At this time until full healing of bone documented.  She will benefit from skilled PT services to continue to address her decreased ROM and strength in RUE in  order to optimize strength and function of RUE for return to full independence in all activities.    OBJECTIVE IMPAIRMENTS decreased mobility, decreased ROM, decreased strength, hypomobility, impaired flexibility, improper body mechanics, postural dysfunction, and pain.   ACTIVITY LIMITATIONS carrying, lifting, bathing, toileting, dressing, reach over head, and hygiene/grooming  PARTICIPATION LIMITATIONS: cleaning, driving, shopping, community activity, occupation, and yard work  PERSONAL FACTORS Age, Behavior pattern, Past/current experiences, Time since onset of injury/illness/exacerbation, Transportation, and 3+  comorbidities: HLD, lumbar DDD, OA of cervical spine, scoliosis  are also affecting patient's functional outcome.   REHAB POTENTIAL: Fair History of R sided RTC tears  CLINICAL DECISION MAKING: Evolving/moderate complexity  EVALUATION COMPLEXITY: Moderate   GOALS: Goals reviewed with patient? No  SHORT TERM GOALS: Target date: 04/14/2022 unless otherwise stated below  Pt will be independent with HEP to improve R shoulder mobility and strength for ADL completion Baseline: Initiated and given Goal status: Partially met  2.  Pt will improve R shoulder PROM to 90 degrees per protocol in prep for progression of AAROM of R shoulder. Baseline: 74 degrees.  R shoulder 95 deg.  Goal status: Goal met Target date: 04/14/2022   3.  Pt will improve R elbow extension to 0 degrees to prevent elbow contracture for ease of RUE ADL completion.  Baseline: Lacking 30 degrees from terminal extension Goal status: Partially met Target date: 04/14/2022    LONG TERM GOALS: Target date: 05/26/2022 unless otherwise posted  Pt will display full PROM by 8 weeks to demonstrate full joint mobility Baseline: Limited in flexion, ER, Abduction Goal status: Not met Target Date: 05/26/2022  2.  Pt will improve FOTO to target score to demonstrate clinically significant improvement in functional mobility of RUE Baseline: 19 with target score of 55 Goal status: Ongoing  3.  Pt will demonstrate at least 3/5 strength in RUE in flexion and abduction for overhead ADL completion Baseline: unable to test currently Goal status: Not met  4.  Pt will demonstrate > 120 degrees of RUE shoulder flexion to demonstrate adequate mobility to complete needed overhead ADL tasks  Baseline: PROM of 74 degrees flexion, 78 PROM abduction Goal status: INITIAL    PLAN: PT FREQUENCY: 2x/week  PT DURATION: 12 weeks  PLANNED INTERVENTIONS: Therapeutic exercises, Therapeutic activity, Neuromuscular re-education, Patient/Family  education, Self Care, Joint mobilization, Dry Needling, Electrical stimulation, Spinal mobilization, Cryotherapy, Moist heat, and Manual therapy  PLAN FOR NEXT SESSION: Progress R shoulder mobility per protocol.  ISSUE resisted ex. For [place] trip.  CHECK GOALS   Pura Spice, PT, DPT # [id number] Physical Therapist- [facility]  05/05/2022, 4:32 PM

## 2022-05-07 ENCOUNTER — Ambulatory Visit: Payer: Medicare HMO | Admitting: Physical Therapy

## 2022-05-07 ENCOUNTER — Encounter: Payer: Self-pay | Admitting: Physical Therapy

## 2022-05-07 DIAGNOSIS — S42201D Unspecified fracture of upper end of right humerus, subsequent encounter for fracture with routine healing: Secondary | ICD-10-CM | POA: Diagnosis not present

## 2022-05-07 DIAGNOSIS — M6281 Muscle weakness (generalized): Secondary | ICD-10-CM | POA: Diagnosis not present

## 2022-05-07 DIAGNOSIS — M25511 Pain in right shoulder: Secondary | ICD-10-CM | POA: Diagnosis not present

## 2022-05-07 DIAGNOSIS — M419 Scoliosis, unspecified: Secondary | ICD-10-CM | POA: Diagnosis not present

## 2022-05-07 DIAGNOSIS — M5416 Radiculopathy, lumbar region: Secondary | ICD-10-CM | POA: Diagnosis not present

## 2022-05-07 DIAGNOSIS — G894 Chronic pain syndrome: Secondary | ICD-10-CM | POA: Diagnosis not present

## 2022-05-07 DIAGNOSIS — M5136 Other intervertebral disc degeneration, lumbar region: Secondary | ICD-10-CM | POA: Diagnosis not present

## 2022-05-07 DIAGNOSIS — M47816 Spondylosis without myelopathy or radiculopathy, lumbar region: Secondary | ICD-10-CM | POA: Diagnosis not present

## 2022-05-07 NOTE — Therapy (Signed)
OUTPATIENT PHYSICAL THERAPY SHOULDER TREATMENT  Patient Name: Jennifer Morrow MRN: [id number] DOB:1951/01/18, 71 y.o., female Today's Date: 05/07/2022   PT End of Session - 05/07/22 0859     Visit Number 17    Number of Visits 25    Date for PT Re-Evaluation 05/26/22    PT Start Time 0850    PT Stop Time 0936    PT Time Calculation (min) 46 min    Activity Tolerance Patient tolerated treatment well;Patient limited by pain    Behavior During Therapy [facility] for tasks assessed/performed              Past Medical History:  Diagnosis Date   Arthritis    Bronchitis    CHRONIC   Bulging lumbar disc    degenerative dics   Scoliosis    LIMITS LUNG FUNCTION   Spinal stenosis    Past Surgical History:  Procedure Laterality Date   CATARACT EXTRACTION W/PHACO Left 01/06/2018   Procedure: CATARACT EXTRACTION PHACO AND INTRAOCULAR LENS PLACEMENT ([place]);  Surgeon: Eulogio Bear, MD;  Location: [facility] ORS;  Service: Ophthalmology;  Laterality: Left;  [place] 00:27.5 AP% 4.3 CDE 1.18 FLUID PACK LOT # [id number]   CATARACT EXTRACTION W/PHACO Right 02/10/2018   Procedure: CATARACT EXTRACTION PHACO AND INTRAOCULAR LENS PLACEMENT ([facility]);  Surgeon: Eulogio Bear, MD;  Location: [facility] ORS;  Service: Ophthalmology;  Laterality: Right;  [place] 00:30.5 AP% 5.0 CDE 1.51  Fluid pack lot # [id number]   FOOT X 2     HEEL SPUR EXCISION     TONSILLECTOMY     Patient Active Problem List   Diagnosis Date Noted   Osteoarthritis cervical spine 01/14/2022   Age-related osteoporosis without current pathological fracture 05/19/2021   Spondylosis of lumbar region without myelopathy or radiculopathy 05/19/2021   Degenerative disc disease, lumbar 05/19/2021   Scoliosis, unspecified 01/09/2015   Vitamin D deficiency 01/08/2014   Mixed hyperlipidemia 01/08/2014    PCP: Halina Maidens MD  REFERRING PROVIDER: Renee Harder MD  REFERRING DIAG: Closed fracture of proximal right humerus   THERAPY DIAG:   Acute pain of right shoulder  Muscle weakness (generalized)  Closed fracture of proximal end of right humerus with routine healing, unspecified fracture morphology, subsequent encounter  Rationale for Evaluation and Treatment Rehabilitation  ONSET DATE: 01/30/22  SUBJECTIVE:                                                                                                                                                                                      SUBJECTIVE STATEMENT: Pt is s/p R proximal humerus fracture on 01/30/22 for conservative management. Pt has been doing her HEP, and  is able to roll briefly onto her L shoulder while sleeping. 0/10 pain prior to tx. Pt has pain while dressing herself, and is not able to fully independently don/doff a shirt yet. Pt stated that she has pain in her R low back, with numbness down her LLE and L low back but is chronic pain she has dealt with for years. Pt currently sleeps in adjustable bed, with the ability to raise the head/feet of the bed. Has been compliant with HEP reporting shoulder is moving better with less pain with mobility.   PERTINENT HISTORY: Pt presenting with R shoulder pain s/p non operative proximal humerus fracture. Pt reports having a tent fall on her leading her to fall to the ground on her R side. Pt here with non-operative PT protocol. Reports orthopedic MD planning for 6-8 weeks of donning sling. Pt has history of R sided RTC tears and orthopedic MD stating return to full AROM in RUE may not be feasible. No MRI for RTC tissue quality as pt reports MD wants to see how pt manages with AROM with fracture recovery. Pt is R handed. Currently receiving HH aide for 4 hours in the morning to assist ADL's primarily dressing. No longer requiring pain meds except Tylenol PRN. Pain vastly has improved, worst pain nowadays is a 4/10 NPS sporadically. Has been working on elbow extension in recliner, some numbness in hand at night palmar and dorsal.  Rated as moderate, unsure of time of numbness.   PAIN:  Are you having pain? Yes: NPRS scale: 0/10 Pain location: R shoulder Pain description: Dull/achey Aggravating factors: RUE movement Relieving factors: Rest  PRECAUTIONS: See protocol in chart  WEIGHT BEARING RESTRICTIONS Yes WBAT on RUE  FALLS:  Has patient fallen in last 6 months? No  LIVING ENVIRONMENT: Lives with: lives with their family and lives alone Lives in: House/apartment Stairs: Yes: External: 3 steps; on right going up Has following equipment at home: None  OCCUPATION: Retired  PLOF: [employer] motion/strength. Return to full independence.    OBJECTIVE:   DIAGNOSTIC FINDINGS:  XR Shoulder 3 Or More Views Right  Final Result   Acute, comminuted and anteriorly displaced fracture of the right proximal humerus with involvement of the surgical and anatomic necks and fragmentation of the greater tuberosity. Large fracture fragment containing the majority of the glenoid articular surface is rotated laterally.   PATIENT SURVEYS:  FOTO 19 with target of 55  COGNITION: Overall cognitive status: Within functional limits for tasks assessed     SENSATION: WFL  POSTURE: Upper trap activation most likely due to wearing sling. Overall WNL.    CERVICAL AROM:  Flexion: 50 degrees Extension: 45 degrees Rotation R/L: 50/45 degrees Lateral flexion (R/L): 30/35 degrees   UPPER EXTREMITY ROM:   Active ROM Right eval Left eval  Shoulder flexion PROM 74 Full  Shoulder extension    Shoulder abduction PROM 78 Full  Shoulder adduction    Shoulder internal rotation Deferred til week 6 per protocol Full  Shoulder external rotation 16 Full  Elbow flexion 129 Full  Elbow extension 30 from full ext 0 degrees  Wrist flexion    Wrist extension    Wrist ulnar deviation    Wrist radial deviation    Wrist pronation    Wrist supination    (Blank rows = not tested)  UPPER EXTREMITY  MMT:    RUE deferred due to NWB status and in sling MMT Right eval Left eval  Shoulder  flexion  5/5  Shoulder extension    Shoulder abduction  5/5  Shoulder adduction    Shoulder internal rotation  5/5  Shoulder external rotation  5/5  Middle trapezius    Lower trapezius    Elbow flexion  5/5  Elbow extension  5/5  Wrist flexion  5/5  Wrist extension  5/5  Wrist ulnar deviation    Wrist radial deviation    Wrist pronation    Wrist supination    Grip strength (lbs) 30.1 lbs (avg 2 trials) 36.5 lbs (avg 2 trials)  (Blank rows = not tested)   PALPATION: TTP along proximal humerus of RUE.     04/02/22:  R shoulder A/PROM in supine: flexion (94 deg.), abduction (92 deg.), ER (33 deg.), IR (78 deg.).  Supine R shoulder PROM flexion 124 deg. (Pain).  Standing R shoulder AROM: flexion (85 deg.), abduction (82 deg.).    04/17/22:  Yellow bulb grasping on R: 2 psi.  Handheld dynamometer: L 56.2#/ R 35.7#.     TODAY'S TREATMENT (05/05/22)  SUBJECTIVE:   Pt. Is heading to [place] on Friday 9/22 to see grand kids for a couple weeks. Pt. Has MD appt. This afternoon to discuss low back issues.       PAIN: 1-2/10 prior to tx. Session.  Pt. Arrived to PT without use of sling.  Pt.has returned to driving.   Ther-ex.:   05/07/22:    B UBE 4 min. F/b (consistent cadence).   Standing wall ladder 5x with holds (as tolerated).    Seated/ standing wand AAROM: shoulder flexion/ chest press/ extension/ IR/ ER/ abduction AA/PROM in chair (minimal to moderate PT assist).  10x each with mirror feedback.    Nautilus:  30# seated lat. Pull downs/ 30# standing tricep extension/ 30# scapular retraction 20x each.  SEE HEP  Reassessment of R shoulder AROM/ strength (see goals).  Grip strength: L 61#/ R 42# (marked improvement).         No manual tx. Today.  Pt. Will ice at home (as needed).       PATIENT EDUCATION: Education details: form/technique with exercise and updated HEP Person  educated: Patient Education method: Explanation, Demonstration, Tactile cues, Verbal cues, and Handouts Education comprehension: verbalized understanding, returned demonstration, and needs further education   HOME EXERCISE PROGRAM: Access Code: [email or web address]  Access Code: [email or web address] URL: [email or web address] Date: 03/12/2022 Prepared by: Dorcas Carrow   Exercises - Seated Shoulder Flexion AAROM with Pulley Behind  - 2 x daily - 7 x weekly - 2 sets - 10 reps - Seated Shoulder Scaption AAROM with Pulley at Side  - 2 x daily - 7 x weekly - 2 sets - 10 reps - Supine Shoulder Press with Dowel  - 1 x daily - 7 x weekly - 3 sets - 10 reps   Access Code: [email or web address] URL: [email or web address] Date: 05/07/2022 Prepared by: Dorcas Carrow  Exercises - Seated Shoulder Flexion AAROM with Pulley Behind  - 2 x daily - 7 x weekly - 2 sets - 10 reps - Seated Shoulder Scaption AAROM with Pulley at Side  - 2 x daily - 7 x weekly - 2 sets - 10 reps - Standing Shoulder Extension with Dowel  - 1 x daily - 7 x weekly - 2 sets - 10 reps - Standing Bilateral Shoulder Internal Rotation AAROM with Dowel  - 1 x daily - 7 x weekly - 2 sets - 10 reps - Standing Shoulder Flexion AAROM with Dowel  - 1  x daily - 7 x weekly - 2 sets - 10 reps - Supine Shoulder Press with Dowel  - 1 x daily - 7 x weekly - 2 sets - 10 reps - Scapular Retraction with Resistance  - 1 x daily - 5 x weekly - 1 sets - 20 reps - Shoulder extension with resistance - Neutral  - 1 x daily - 5 x weekly - 1 sets - 20 reps - Standing Shoulder Flexion with Resistance  - 1 x daily - 5 x weekly - 1 sets - 20 reps - Standing Single Arm Elbow Flexion with Resistance  - 1 x daily - 5 x weekly - 1 sets - 20 reps   ASSESSMENT:  CLINICAL IMPRESSION: Pt. Pain limited with R shoulder static stretches at Nautilus and during standing wand AA/PROM with PT assist.  Moderate R UT muscle tightness and R UT overcompensation with R shoulder  reaching.  See updated PT goals and R shoulder ROM/ strengthening.  No shoulder mobs. At this time until full healing of bone documented.  She will benefit from skilled PT services to continue to address her decreased ROM and strength in RUE in order to optimize strength and function of RUE for return to full independence in all activities.    OBJECTIVE IMPAIRMENTS decreased mobility, decreased ROM, decreased strength, hypomobility, impaired flexibility, improper body mechanics, postural dysfunction, and pain.   ACTIVITY LIMITATIONS carrying, lifting, bathing, toileting, dressing, reach over head, and hygiene/grooming  PARTICIPATION LIMITATIONS: cleaning, driving, shopping, community activity, occupation, and yard work  PERSONAL FACTORS Age, Behavior pattern, Past/current experiences, Time since onset of injury/illness/exacerbation, Transportation, and 3+ comorbidities: HLD, lumbar DDD, OA of cervical spine, scoliosis  are also affecting patient's functional outcome.   REHAB POTENTIAL: Fair History of R sided RTC tears  CLINICAL DECISION MAKING: Evolving/moderate complexity  EVALUATION COMPLEXITY: Moderate   GOALS: Goals reviewed with patient? No  SHORT TERM GOALS: Target date: 04/14/2022 unless otherwise stated below  Pt will be independent with HEP to improve R shoulder mobility and strength for ADL completion Baseline: Initiated and given.  9/21: see updated HEP with RTB (good technique) Goal status: Goal met  2.  Pt will improve R shoulder PROM to 90 degrees per protocol in prep for progression of AAROM of R shoulder. Baseline: 74 degrees.  R shoulder 95 deg.  9/21: 98 deg. AROM (pain) Goal status: Goal met   3.  Pt will improve R elbow extension to 0 degrees to prevent elbow contracture for ease of RUE ADL completion.  Baseline: Lacking 30 degrees from terminal extension.  9/21: -2 deg. extension Goal status: Goal met    LONG TERM GOALS: Target date: 05/26/2022 unless  otherwise posted  Pt will display full PROM by 8 weeks to demonstrate full joint mobility Baseline: Limited in flexion, ER, Abduction Goal status: Not met Target Date: 05/26/2022  2.  Pt will improve FOTO to target score to demonstrate clinically significant improvement in functional mobility of RUE Baseline: 19 with target score of 55.  9/21: 60 (marked improvement)- limited with overhead reaching/ heavy household chores.   Goal status: Ongoing  3.  Pt will demonstrate at least 3/5 strength in RUE in flexion and abduction for overhead ADL completion Baseline: unable to test currently.  9/21:  R shoulder flexion 3-/5 (pain), bicep 4/5, tricep 4+/5, IR 4/5, ER 3-/5, extension 4+/5 MMT Goal status: Not met  4.  Pt will demonstrate > 120 degrees of RUE shoulder flexion to demonstrate adequate mobility  to complete needed overhead ADL tasks  Baseline: PROM of 74 degrees flexion, 78 PROM abduction.  9/21: 98 deg. AROM in seated posture Goal status:  Not met    PLAN: PT FREQUENCY: 2x/week  PT DURATION: 12 weeks  PLANNED INTERVENTIONS: Therapeutic exercises, Therapeutic activity, Neuromuscular re-education, Patient/Family education, Self Care, Joint mobilization, Dry Needling, Electrical stimulation, Spinal mobilization, Cryotherapy, Moist heat, and Manual therapy  PLAN FOR NEXT SESSION: Progress R shoulder mobility per protocol.  Discuss trip to [place], [place], DPT # [facility]  05/07/2022, 9:43 AM

## 2022-05-28 ENCOUNTER — Ambulatory Visit: Payer: Medicare HMO | Attending: Orthopaedic Surgery | Admitting: Physical Therapy

## 2022-05-28 ENCOUNTER — Encounter: Payer: Self-pay | Admitting: Physical Therapy

## 2022-05-28 DIAGNOSIS — S42201D Unspecified fracture of upper end of right humerus, subsequent encounter for fracture with routine healing: Secondary | ICD-10-CM | POA: Insufficient documentation

## 2022-05-28 DIAGNOSIS — M6281 Muscle weakness (generalized): Secondary | ICD-10-CM | POA: Insufficient documentation

## 2022-05-28 DIAGNOSIS — M25511 Pain in right shoulder: Secondary | ICD-10-CM | POA: Diagnosis not present

## 2022-05-28 NOTE — Therapy (Signed)
OUTPATIENT PHYSICAL THERAPY SHOULDER TREATMENT/RECERTIFICATION  Patient Name: Jennifer Morrow MRN: [id number] DOB:Sep 23, 1950, 71 y.o., female Today's Date: 05/28/22     PT End of Session -     Visit Number 18    Number of Visits 25    Date for PT Re-Evaluation 06/25/22    PT Start Time 0904 to 2671   Activity Tolerance Patient tolerated treatment well;Patient limited by pain    Behavior During Therapy [facility] for tasks assessed/performed           Past Medical History:  Diagnosis Date   Arthritis    Bronchitis    CHRONIC   Bulging lumbar disc    degenerative dics   Scoliosis    LIMITS LUNG FUNCTION   Spinal stenosis    Past Surgical History:  Procedure Laterality Date   CATARACT EXTRACTION W/PHACO Left 01/06/2018   Procedure: CATARACT EXTRACTION PHACO AND INTRAOCULAR LENS PLACEMENT ([place]);  Surgeon: Eulogio Bear, MD;  Location: [facility] ORS;  Service: Ophthalmology;  Laterality: Left;  [place] 00:27.5 AP% 4.3 CDE 1.18 FLUID PACK LOT # [id number]   CATARACT EXTRACTION W/PHACO Right 02/10/2018   Procedure: CATARACT EXTRACTION PHACO AND INTRAOCULAR LENS PLACEMENT ([facility]);  Surgeon: Eulogio Bear, MD;  Location: [facility] ORS;  Service: Ophthalmology;  Laterality: Right;  [place] 00:30.5 AP% 5.0 CDE 1.51  Fluid pack lot # [id number]   FOOT X 2     HEEL SPUR EXCISION     TONSILLECTOMY     Patient Active Problem List   Diagnosis Date Noted   Osteoarthritis cervical spine 01/14/2022   Age-related osteoporosis without current pathological fracture 05/19/2021   Spondylosis of lumbar region without myelopathy or radiculopathy 05/19/2021   Degenerative disc disease, lumbar 05/19/2021   Scoliosis, unspecified 01/09/2015   Vitamin D deficiency 01/08/2014   Mixed hyperlipidemia 01/08/2014    PCP: Halina Maidens MD  REFERRING PROVIDER: Renee Harder MD  REFERRING DIAG: Closed fracture of proximal right humerus   THERAPY DIAG:  Acute pain of right shoulder  Muscle weakness  (generalized)  Closed fracture of proximal end of right humerus with routine healing, unspecified fracture morphology, subsequent encounter  Rationale for Evaluation and Treatment Rehabilitation  ONSET DATE: 01/30/22  SUBJECTIVE:                                                                                                                                                                                      SUBJECTIVE STATEMENT:  EVALUATION Pt is s/p R proximal humerus fracture on 01/30/22 for conservative management. Pt has been doing her HEP, and is able to roll briefly onto her L shoulder while sleeping. 0/10 pain prior to tx.  Pt has pain while dressing herself, and is not able to fully independently don/doff a shirt yet. Pt stated that she has pain in her R low back, with numbness down her LLE and L low back but is chronic pain she has dealt with for years. Pt currently sleeps in adjustable bed, with the ability to raise the head/feet of the bed. Has been compliant with HEP reporting shoulder is moving better with less pain with mobility.   PERTINENT HISTORY: Pt presenting with R shoulder pain s/p non operative proximal humerus fracture. Pt reports having a tent fall on her leading her to fall to the ground on her R side. Pt here with non-operative PT protocol. Reports orthopedic MD planning for 6-8 weeks of donning sling. Pt has history of R sided RTC tears and orthopedic MD stating return to full AROM in RUE may not be feasible. No MRI for RTC tissue quality as pt reports MD wants to see how pt manages with AROM with fracture recovery. Pt is R handed. Currently receiving HH aide for 4 hours in the morning to assist ADL's primarily dressing. No longer requiring pain meds except Tylenol PRN. Pain vastly has improved, worst pain nowadays is a 4/10 NPS sporadically. Has been working on elbow extension in recliner, some numbness in hand at night palmar and dorsal. Rated as moderate, unsure of time of  numbness.   PAIN:  Are you having pain? Yes: NPRS scale: 0/10 Pain location: R shoulder Pain description: Dull/achey Aggravating factors: RUE movement Relieving factors: Rest  PRECAUTIONS: See protocol in chart  WEIGHT BEARING RESTRICTIONS Yes WBAT on RUE  FALLS:  Has patient fallen in last 6 months? No  LIVING ENVIRONMENT: Lives with: lives with their family and lives alone Lives in: House/apartment Stairs: Yes: External: 3 steps; on right going up Has following equipment at home: None  OCCUPATION: Retired  PLOF: [place] motion/strength. Return to full independence.    OBJECTIVE:   DIAGNOSTIC FINDINGS:  XR Shoulder 3 Or More Views Right  Final Result   Acute, comminuted and anteriorly displaced fracture of the right proximal humerus with involvement of the surgical and anatomic necks and fragmentation of the greater tuberosity. Large fracture fragment containing the majority of the glenoid articular surface is rotated laterally.   PATIENT SURVEYS:  FOTO 19 with target of 86  COGNITION: Overall cognitive status: Within functional limits for tasks assessed     SENSATION: WFL  POSTURE: Upper trap activation most likely due to wearing sling. Overall WNL.    CERVICAL AROM:  Flexion: 50 degrees Extension: 45 degrees Rotation R/L: 50/45 degrees Lateral flexion (R/L): 30/35 degrees   UPPER EXTREMITY ROM:   Active ROM Right eval Left eval  Shoulder flexion PROM 74 Full  Shoulder extension    Shoulder abduction PROM 78 Full  Shoulder adduction    Shoulder internal rotation Deferred til week 6 per protocol Full  Shoulder external rotation 16 Full  Elbow flexion 129 Full  Elbow extension 30 from full ext 0 degrees   UPPER EXTREMITY MMT:    RUE deferred due to NWB status and in sling MMT Right eval Left eval  Shoulder flexion  5/5  Shoulder extension    Shoulder abduction  5/5  Shoulder adduction    Shoulder internal  rotation  5/5  Shoulder external rotation  5/5  Middle trapezius    Lower trapezius    Elbow flexion  5/5  Elbow extension  5/5  Wrist flexion  5/5  Wrist extension  5/5  Wrist ulnar deviation    Wrist radial deviation    Wrist pronation    Wrist supination    Grip strength (lbs) 30.1 lbs (avg 2 trials) 36.5 lbs (avg 2 trials)  (Blank rows = not tested)   PALPATION: TTP along proximal humerus of RUE.     04/02/22:  R shoulder A/PROM in supine: flexion (94 deg.), abduction (92 deg.), ER (33 deg.), IR (78 deg.).  Supine R shoulder PROM flexion 124 deg. (Pain).  Standing R shoulder AROM: flexion (85 deg.), abduction (82 deg.).    04/17/22:  Yellow bulb grasping on R: 2 psi.  Handheld dynamometer: L 56.2#/ R 35.7#.    05/07/22:  Grip strength: L 61#/ R 42# (marked improvement).     TODAY'S TREATMENT (05/28/22)  SUBJECTIVE:   Pt. Reports no R shoulder pain currently.  Pt. Has decreased use if extra strength Tylenol.  Pt. Had a great trip to [place] to see family.  Pt. Reports shoulder is doing much better and returns to MD on Monday for imaging to determine if bone is healed.       PAIN: 0/10 prior to tx. Session.   Pt.has returned to driving.   Ther-ex.:   05/28/22:    B UBE 4 min. F/b (consistent cadence)- discussed use of shoulder while in [place]  Standing wall ladder 5x with holds (as tolerated)- increase sticker placement.     Nautilus:  40# seated lat. Pull downs/ 30# standing tricep extension/ 30# scapular retraction 20x each.   Standing weighted wand AAROM: shoulder flexion/ chest press/ extension/ IR/ ER/ abduction AA/PROM in chair (minimal to moderate PT assist).  10x each with mirror feedback.   Reassessment of R shoulder AROM/ strength.        No manual tx. Today.  Pt. Will ice at home (as needed).       PATIENT EDUCATION: Education details: form/technique with exercise and updated HEP Person educated: Patient Education method: Explanation,  Demonstration, Tactile cues, Verbal cues, and Handouts Education comprehension: verbalized understanding, returned demonstration, and needs further education   HOME EXERCISE PROGRAM: Access Code: [email or web address]  Access Code: [email or web address] URL: [email or web address] Date: 03/12/2022 Prepared by: Dorcas Carrow   Exercises - Seated Shoulder Flexion AAROM with Pulley Behind  - 2 x daily - 7 x weekly - 2 sets - 10 reps - Seated Shoulder Scaption AAROM with Pulley at Side  - 2 x daily - 7 x weekly - 2 sets - 10 reps - Supine Shoulder Press with Dowel  - 1 x daily - 7 x weekly - 3 sets - 10 reps   Access Code: [email or web address] URL: [email or web address] Date: 05/07/2022 Prepared by: Dorcas Carrow  Exercises - Seated Shoulder Flexion AAROM with Pulley Behind  - 2 x daily - 7 x weekly - 2 sets - 10 reps - Seated Shoulder Scaption AAROM with Pulley at Side  - 2 x daily - 7 x weekly - 2 sets - 10 reps - Standing Shoulder Extension with Dowel  - 1 x daily - 7 x weekly - 2 sets - 10 reps - Standing Bilateral Shoulder Internal Rotation AAROM with Dowel  - 1 x daily - 7 x weekly - 2 sets - 10 reps - Standing Shoulder Flexion AAROM with Dowel  - 1 x daily - 7 x weekly - 2 sets - 10 reps - Supine Shoulder Press with Dowel  - 1 x daily - 7 x weekly - 2 sets - 10  reps - Scapular Retraction with Resistance  - 1 x daily - 5 x weekly - 1 sets - 20 reps - Shoulder extension with resistance - Neutral  - 1 x daily - 5 x weekly - 1 sets - 20 reps - Standing Shoulder Flexion with Resistance  - 1 x daily - 5 x weekly - 1 sets - 20 reps - Standing Single Arm Elbow Flexion with Resistance  - 1 x daily - 5 x weekly - 1 sets - 20 reps   ASSESSMENT:  CLINICAL IMPRESSION: Pt. Pain limited with R shoulder static stretches at Nautilus and during standing wand AA/PROM with PT assist.  Moderate R UT muscle tightness and R UT overcompensation with R shoulder reaching.  See updated PT goals and R shoulder  ROM/ strengthening.  No shoulder mobs. At this time until full healing of bone documented.  She will benefit from skilled PT services to continue to address her decreased ROM and strength in RUE in order to optimize strength and function of RUE for return to full independence in all activities.    OBJECTIVE IMPAIRMENTS decreased mobility, decreased ROM, decreased strength, hypomobility, impaired flexibility, improper body mechanics, postural dysfunction, and pain.   ACTIVITY LIMITATIONS carrying, lifting, bathing, toileting, dressing, reach over head, and hygiene/grooming  PARTICIPATION LIMITATIONS: cleaning, driving, shopping, community activity, occupation, and yard work  PERSONAL FACTORS Age, Behavior pattern, Past/current experiences, Time since onset of injury/illness/exacerbation, Transportation, and 3+ comorbidities: HLD, lumbar DDD, OA of cervical spine, scoliosis  are also affecting patient's functional outcome.   REHAB POTENTIAL: Fair History of R sided RTC tears  CLINICAL DECISION MAKING: Evolving/moderate complexity  EVALUATION COMPLEXITY: Moderate   GOALS: Goals reviewed with patient? Yes  SHORT TERM GOALS:   Pt will be independent with HEP to improve R shoulder mobility and strength for ADL completion Baseline: Initiated and given.  9/21: see updated HEP with RTB (good technique) Goal status: Goal met  2.  Pt will improve R shoulder PROM to 90 degrees per protocol in prep for progression of AAROM of R shoulder. Baseline: 74 degrees.  R shoulder 95 deg.  9/21: 98 deg. AROM (pain) Goal status: Goal met   3.  Pt will improve R elbow extension to 0 degrees to prevent elbow contracture for ease of RUE ADL completion.  Baseline: Lacking 30 degrees from terminal extension.  9/21: -2 deg. extension Goal status: Goal met    LONG TERM GOALS: Target date: 06/25/22 unless otherwise posted  Pt will display full PROM by 8 weeks to demonstrate full joint mobility Baseline:  Limited in flexion, ER, Abduction Goal status: Not met Target Date: 06/25/2022  2.  Pt will improve FOTO to target score to demonstrate clinically significant improvement in functional mobility of RUE Baseline: 19 with target score of 55.  9/21: 60 (marked improvement)- limited with overhead reaching/ heavy household chores.   Goal status: Ongoing  3.  Pt will demonstrate at least 3/5 strength in RUE in flexion and abduction for overhead ADL completion Baseline: unable to test currently.  9/21:  R shoulder flexion 3-/5 (pain), bicep 4/5, tricep 4+/5, IR 4/5, ER 3-/5, extension 4+/5 MMT Goal status: Not met  4.  Pt will demonstrate > 120 degrees of RUE shoulder flexion to demonstrate adequate mobility to complete needed overhead ADL tasks  Baseline: PROM of 74 degrees flexion, 78 PROM abduction.  9/21: 98 deg. AROM in seated posture Goal status:  Not met    PLAN: PT FREQUENCY: 2x/week  PT  DURATION: 4 weeks  PLANNED INTERVENTIONS: Therapeutic exercises, Therapeutic activity, Neuromuscular re-education, Patient/Family education, Self Care, Joint mobilization, Dry Needling, Electrical stimulation, Spinal mobilization, Cryotherapy, Moist heat, and Manual therapy  PLAN FOR NEXT SESSION: Progress R shoulder mobility per protocol.     Pura Spice, PT, DPT # [phone] Physical Therapist- [facility]  05/31/2022, 6:26 PM

## 2022-06-01 DIAGNOSIS — S42201A Unspecified fracture of upper end of right humerus, initial encounter for closed fracture: Secondary | ICD-10-CM | POA: Diagnosis not present

## 2022-06-02 ENCOUNTER — Ambulatory Visit: Payer: Medicare HMO | Admitting: Physical Therapy

## 2022-06-02 DIAGNOSIS — M25511 Pain in right shoulder: Secondary | ICD-10-CM | POA: Diagnosis not present

## 2022-06-02 DIAGNOSIS — S42201D Unspecified fracture of upper end of right humerus, subsequent encounter for fracture with routine healing: Secondary | ICD-10-CM | POA: Diagnosis not present

## 2022-06-02 DIAGNOSIS — M6281 Muscle weakness (generalized): Secondary | ICD-10-CM | POA: Diagnosis not present

## 2022-06-02 NOTE — Therapy (Signed)
OUTPATIENT PHYSICAL THERAPY SHOULDER TREATMENT  Patient Name: Jennifer Morrow MRN: [id number] DOB:10/02/1950, 71 y.o., female Today's Date: 06/02/22   PT End of Session - 06/02/22 1045     Visit Number 19    Number of Visits 25    Date for PT Re-Evaluation 06/25/22    PT Start Time 0906    PT Stop Time 0946    PT Time Calculation (min) 40 min    Activity Tolerance Patient tolerated treatment well;Patient limited by pain    Behavior During Therapy [facility] for tasks assessed/performed             Past Medical History:  Diagnosis Date   Arthritis    Bronchitis    CHRONIC   Bulging lumbar disc    degenerative dics   Scoliosis    LIMITS LUNG FUNCTION   Spinal stenosis    Past Surgical History:  Procedure Laterality Date   CATARACT EXTRACTION W/PHACO Left 01/06/2018   Procedure: CATARACT EXTRACTION PHACO AND INTRAOCULAR LENS PLACEMENT ([place]);  Surgeon: Eulogio Bear, MD;  Location: [facility] ORS;  Service: Ophthalmology;  Laterality: Left;  [place] 00:27.5 AP% 4.3 CDE 1.18 FLUID PACK LOT # [id number]   CATARACT EXTRACTION W/PHACO Right 02/10/2018   Procedure: CATARACT EXTRACTION PHACO AND INTRAOCULAR LENS PLACEMENT ([facility]);  Surgeon: Eulogio Bear, MD;  Location: [facility] ORS;  Service: Ophthalmology;  Laterality: Right;  [place] 00:30.5 AP% 5.0 CDE 1.51  Fluid pack lot # [id number]   FOOT X 2     HEEL SPUR EXCISION     TONSILLECTOMY     Patient Active Problem List   Diagnosis Date Noted   Osteoarthritis cervical spine 01/14/2022   Age-related osteoporosis without current pathological fracture 05/19/2021   Spondylosis of lumbar region without myelopathy or radiculopathy 05/19/2021   Degenerative disc disease, lumbar 05/19/2021   Scoliosis, unspecified 01/09/2015   Vitamin D deficiency 01/08/2014   Mixed hyperlipidemia 01/08/2014    PCP: Halina Maidens MD  REFERRING PROVIDER: Renee Harder MD  REFERRING DIAG: Closed fracture of proximal right humerus   THERAPY DIAG:  Acute  pain of right shoulder  Muscle weakness (generalized)  Closed fracture of proximal end of right humerus with routine healing, unspecified fracture morphology, subsequent encounter  Rationale for Evaluation and Treatment Rehabilitation  ONSET DATE: 01/30/22  SUBJECTIVE:                                                                                                                                                                                      SUBJECTIVE STATEMENT:  EVALUATION Pt is s/p R proximal humerus fracture on 01/30/22 for conservative management. Pt has been doing her HEP,  and is able to roll briefly onto her L shoulder while sleeping. 0/10 pain prior to tx. Pt has pain while dressing herself, and is not able to fully independently don/doff a shirt yet. Pt stated that she has pain in her R low back, with numbness down her LLE and L low back but is chronic pain she has dealt with for years. Pt currently sleeps in adjustable bed, with the ability to raise the head/feet of the bed. Has been compliant with HEP reporting shoulder is moving better with less pain with mobility.   PERTINENT HISTORY: Pt presenting with R shoulder pain s/p non operative proximal humerus fracture. Pt reports having a tent fall on her leading her to fall to the ground on her R side. Pt here with non-operative PT protocol. Reports orthopedic MD planning for 6-8 weeks of donning sling. Pt has history of R sided RTC tears and orthopedic MD stating return to full AROM in RUE may not be feasible. No MRI for RTC tissue quality as pt reports MD wants to see how pt manages with AROM with fracture recovery. Pt is R handed. Currently receiving HH aide for 4 hours in the morning to assist ADL's primarily dressing. No longer requiring pain meds except Tylenol PRN. Pain vastly has improved, worst pain nowadays is a 4/10 NPS sporadically. Has been working on elbow extension in recliner, some numbness in hand at night palmar and  dorsal. Rated as moderate, unsure of time of numbness.   PAIN:  Are you having pain? Yes: NPRS scale: 0/10 Pain location: R shoulder Pain description: Dull/achey Aggravating factors: RUE movement Relieving factors: Rest  PRECAUTIONS: See protocol in chart  WEIGHT BEARING RESTRICTIONS Yes WBAT on RUE  FALLS:  Has patient fallen in last 6 months? No  LIVING ENVIRONMENT: Lives with: lives with their family and lives alone Lives in: House/apartment Stairs: Yes: External: 3 steps; on right going up Has following equipment at home: None  OCCUPATION: Retired  PLOF: [place] motion/strength. Return to full independence.    OBJECTIVE:   DIAGNOSTIC FINDINGS:  XR Shoulder 3 Or More Views Right  Final Result   Acute, comminuted and anteriorly displaced fracture of the right proximal humerus with involvement of the surgical and anatomic necks and fragmentation of the greater tuberosity. Large fracture fragment containing the majority of the glenoid articular surface is rotated laterally.   PATIENT SURVEYS:  FOTO 19 with target of 67  COGNITION: Overall cognitive status: Within functional limits for tasks assessed     SENSATION: WFL  POSTURE: Upper trap activation most likely due to wearing sling. Overall WNL.    CERVICAL AROM:  Flexion: 50 degrees Extension: 45 degrees Rotation R/L: 50/45 degrees Lateral flexion (R/L): 30/35 degrees   UPPER EXTREMITY ROM:   Active ROM Right eval Left eval  Shoulder flexion PROM 74 Full  Shoulder extension    Shoulder abduction PROM 78 Full  Shoulder adduction    Shoulder internal rotation Deferred til week 6 per protocol Full  Shoulder external rotation 16 Full  Elbow flexion 129 Full  Elbow extension 30 from full ext 0 degrees   UPPER EXTREMITY MMT:    RUE deferred due to NWB status and in sling MMT Right eval Left eval  Shoulder flexion  5/5  Shoulder extension    Shoulder abduction   5/5  Shoulder adduction    Shoulder internal rotation  5/5  Shoulder external rotation  5/5  Middle trapezius  Lower trapezius    Elbow flexion  5/5  Elbow extension  5/5  Wrist flexion  5/5  Wrist extension  5/5  Wrist ulnar deviation    Wrist radial deviation    Wrist pronation    Wrist supination    Grip strength (lbs) 30.1 lbs (avg 2 trials) 36.5 lbs (avg 2 trials)  (Blank rows = not tested)   PALPATION: TTP along proximal humerus of RUE.     04/02/22:  R shoulder A/PROM in supine: flexion (94 deg.), abduction (92 deg.), ER (33 deg.), IR (78 deg.).  Supine R shoulder PROM flexion 124 deg. (Pain).  Standing R shoulder AROM: flexion (85 deg.), abduction (82 deg.).    04/17/22:  Yellow bulb grasping on R: 2 psi.  Handheld dynamometer: L 56.2#/ R 35.7#.    05/07/22:  Grip strength: L 61#/ R 42# (marked improvement).     TODAY'S TREATMENT (06/02/22)  SUBJECTIVE:   Pt. States x-ray reveals fracture is healed.  Pt. Reports she won't be able to get normal shoulder ROM/ flexion due to location of fracture.  Pt. States she has a busy day and wants to take it easy today due to garden auction this afternoon.       PAIN: 0/10 prior to tx. Session.   Pt.has returned to driving.   Ther-ex.:   06/02/22:    There.ex.:  B UBE 4 min. F/b (consistent cadence)- discussed MD appt./ functional tasks at home.   Seated R shoulder A/AROM with mirror feedback 10x all planes.  Pt. Limited with holding R shoulder >95 deg.   Reassessment of R shoulder AROM/ strength.     Supine 2# dumbbell R shoulder flexion/ press-ups/ tricep extension/ bicep curls 20x each.    L sidelying R shoulder abduction/ horizontal abduction AROM 10x each.        Manual tx.:  Supine/ L sidelying R shoulder PROM to focus on flexion/ abduction.  R scapular mobs during shoulder ROM.  STM to R UT/posterior deltoid/ rhomboid musculature.     Pt. Will ice at home (as needed).       PATIENT EDUCATION: Education  details: form/technique with exercise and updated HEP Person educated: Patient Education method: Explanation, Demonstration, Tactile cues, Verbal cues, and Handouts Education comprehension: verbalized understanding, returned demonstration, and needs further education   HOME EXERCISE PROGRAM: Access Code: [email or web address]  Access Code: [email or web address] URL: [email or web address] Date: 03/12/2022 Prepared by: Dorcas Carrow   Exercises - Seated Shoulder Flexion AAROM with Pulley Behind  - 2 x daily - 7 x weekly - 2 sets - 10 reps - Seated Shoulder Scaption AAROM with Pulley at Side  - 2 x daily - 7 x weekly - 2 sets - 10 reps - Supine Shoulder Press with Dowel  - 1 x daily - 7 x weekly - 3 sets - 10 reps   Access Code: [email or web address] URL: [email or web address] Date: 05/07/2022 Prepared by: Dorcas Carrow  Exercises - Seated Shoulder Flexion AAROM with Pulley Behind  - 2 x daily - 7 x weekly - 2 sets - 10 reps - Seated Shoulder Scaption AAROM with Pulley at Side  - 2 x daily - 7 x weekly - 2 sets - 10 reps - Standing Shoulder Extension with Dowel  - 1 x daily - 7 x weekly - 2 sets - 10 reps - Standing Bilateral Shoulder Internal Rotation AAROM with Dowel  - 1 x daily - 7 x weekly - 2 sets - 10 reps - Standing Shoulder Flexion AAROM with  Dowel  - 1 x daily - 7 x weekly - 2 sets - 10 reps - Supine Shoulder Press with Dowel  - 1 x daily - 7 x weekly - 2 sets - 10 reps - Scapular Retraction with Resistance  - 1 x daily - 5 x weekly - 1 sets - 20 reps - Shoulder extension with resistance - Neutral  - 1 x daily - 5 x weekly - 1 sets - 20 reps - Standing Shoulder Flexion with Resistance  - 1 x daily - 5 x weekly - 1 sets - 20 reps - Standing Single Arm Elbow Flexion with Resistance  - 1 x daily - 5 x weekly - 1 sets - 20 reps   ASSESSMENT:  CLINICAL IMPRESSION: Moderate R UT muscle tightness and R UT overcompensation with R shoulder reaching.  Tx. Focus on R shoulder ROM and light  resisted ex. Today.  Pt has limited scapular mobility due to shoulder ROM limitations.  No pain reported during tx. And pt. Motivated to increase shoulder to assist with daily tasks.  Pt. will benefit from skilled PT services to continue to address her decreased ROM and strength in RUE in order to optimize strength and function of RUE for return to full independence in all activities.    OBJECTIVE IMPAIRMENTS decreased mobility, decreased ROM, decreased strength, hypomobility, impaired flexibility, improper body mechanics, postural dysfunction, and pain.   ACTIVITY LIMITATIONS carrying, lifting, bathing, toileting, dressing, reach over head, and hygiene/grooming  PARTICIPATION LIMITATIONS: cleaning, driving, shopping, community activity, occupation, and yard work  PERSONAL FACTORS Age, Behavior pattern, Past/current experiences, Time since onset of injury/illness/exacerbation, Transportation, and 3+ comorbidities: HLD, lumbar DDD, OA of cervical spine, scoliosis  are also affecting patient's functional outcome.   REHAB POTENTIAL: Fair History of R sided RTC tears  CLINICAL DECISION MAKING: Evolving/moderate complexity  EVALUATION COMPLEXITY: Moderate   GOALS: Goals reviewed with patient? Yes  SHORT TERM GOALS:   Pt will be independent with HEP to improve R shoulder mobility and strength for ADL completion Baseline: Initiated and given.  9/21: see updated HEP with RTB (good technique) Goal status: Goal met  2.  Pt will improve R shoulder PROM to 90 degrees per protocol in prep for progression of AAROM of R shoulder. Baseline: 74 degrees.  R shoulder 95 deg.  9/21: 98 deg. AROM (pain) Goal status: Goal met   3.  Pt will improve R elbow extension to 0 degrees to prevent elbow contracture for ease of RUE ADL completion.  Baseline: Lacking 30 degrees from terminal extension.  9/21: -2 deg. extension Goal status: Goal met    LONG TERM GOALS: Target date: 06/25/22 unless otherwise  posted  Pt will display full PROM by 8 weeks to demonstrate full joint mobility Baseline: Limited in flexion, ER, Abduction Goal status: Not met Target Date: 06/25/2022  2.  Pt will improve FOTO to target score to demonstrate clinically significant improvement in functional mobility of RUE Baseline: 19 with target score of 55.  9/21: 60 (marked improvement)- limited with overhead reaching/ heavy household chores.   Goal status: Ongoing  3.  Pt will demonstrate at least 3/5 strength in RUE in flexion and abduction for overhead ADL completion Baseline: unable to test currently.  9/21:  R shoulder flexion 3-/5 (pain), bicep 4/5, tricep 4+/5, IR 4/5, ER 3-/5, extension 4+/5 MMT Goal status: Not met  4.  Pt will demonstrate > 120 degrees of RUE shoulder flexion to demonstrate adequate mobility to complete needed overhead  ADL tasks  Baseline: PROM of 74 degrees flexion, 78 PROM abduction.  9/21: 98 deg. AROM in seated posture Goal status:  Not met    PLAN: PT FREQUENCY: 2x/week  PT DURATION: 4 weeks  PLANNED INTERVENTIONS: Therapeutic exercises, Therapeutic activity, Neuromuscular re-education, Patient/Family education, Self Care, Joint mobilization, Dry Needling, Electrical stimulation, Spinal mobilization, Cryotherapy, Moist heat, and Manual therapy  PLAN FOR NEXT SESSION: Progress R shoulder mobility per protocol.  Strengthening focus.  10th visit progress note next tx.     Pura Spice, PT, DPT # [phone] Physical Therapist- [facility]  06/02/2022, 10:47 AM

## 2022-06-04 ENCOUNTER — Ambulatory Visit: Payer: Medicare HMO | Admitting: Physical Therapy

## 2022-06-04 DIAGNOSIS — M25511 Pain in right shoulder: Secondary | ICD-10-CM | POA: Diagnosis not present

## 2022-06-04 DIAGNOSIS — M6281 Muscle weakness (generalized): Secondary | ICD-10-CM

## 2022-06-04 DIAGNOSIS — S42201D Unspecified fracture of upper end of right humerus, subsequent encounter for fracture with routine healing: Secondary | ICD-10-CM | POA: Diagnosis not present

## 2022-06-04 NOTE — Therapy (Signed)
OUTPATIENT PHYSICAL THERAPY SHOULDER TREATMENT Physical Therapy Progress Note   Dates of reporting period  04/09/22 to  06/04/22   Patient Name: Jennifer Morrow MRN: [id number] DOB:01-01-1951, 71 y.o., female Today's Date: 06/04/22   PT End of Session - 06/04/22 0908     Visit Number 20    Number of Visits 25    Date for PT Re-Evaluation 06/25/22    PT Start Time 0901    PT Stop Time 0945    PT Time Calculation (min) 44 min    Activity Tolerance Patient tolerated treatment well;Patient limited by pain    Behavior During Therapy [facility] for tasks assessed/performed             Past Medical History:  Diagnosis Date   Arthritis    Bronchitis    CHRONIC   Bulging lumbar disc    degenerative dics   Scoliosis    LIMITS LUNG FUNCTION   Spinal stenosis    Past Surgical History:  Procedure Laterality Date   CATARACT EXTRACTION W/PHACO Left 01/06/2018   Procedure: CATARACT EXTRACTION PHACO AND INTRAOCULAR LENS PLACEMENT ([place]);  Surgeon: Eulogio Bear, MD;  Location: [facility] OR[facility];  Service: Ophthalmology;  Laterality: Left;  [place] 00:27.5 AP% 4.3 CDE 1.18 FLUID PACK LOT # [id number]   CATARACT EXTRACTION W/PHACO Right 02/10/2018   Procedure: CATARACT EXTRACTION PHACO AND INTRAOCULAR LENS PLACEMENT ([facility]);  Surgeon: Eulogio Bear, MD;  Location: [facility] OR[facility];  Service: Ophthalmology;  Laterality: Right;  [place] 00:30.5 AP% 5.0 CDE 1.51  Fluid pack lot # [id number]   FOOT X 2     HEEL SPUR EXCISION     TONSILLECTOMY     Patient Active Problem List   Diagnosis Date Noted   Osteoarthritis cervical spine 01/14/2022   Age-related osteoporosis without current pathological fracture 05/19/2021   Spondylosis of lumbar region without myelopathy or radiculopathy 05/19/2021   Degenerative disc disease, lumbar 05/19/2021   Scoliosis, unspecified 01/09/2015   Vitamin D deficiency 01/08/2014   Mixed hyperlipidemia 01/08/2014    PCP: Halina Maidens MD  REFERRING PROVIDER: Renee Harder  MD  REFERRING DIAG: Closed fracture of proximal right humerus   THERAPY DIAG:  Acute pain of right shoulder  Muscle weakness (generalized)  Closed fracture of proximal end of right humerus with routine healing, unspecified fracture morphology, subsequent encounter  Rationale for Evaluation and Treatment Rehabilitation  ONSET DATE: 01/30/22  SUBJECTIVE:                                                                                                                                                                                      SUBJECTIVE STATEMENT:  EVALUATION Pt is  s/p R proximal humerus fracture on 01/30/22 for conservative management. Pt has been doing her HEP, and is able to roll briefly onto her L shoulder while sleeping. 0/10 pain prior to tx. Pt has pain while dressing herself, and is not able to fully independently don/doff a shirt yet. Pt stated that she has pain in her R low back, with numbness down her LLE and L low back but is chronic pain she has dealt with for years. Pt currently sleeps in adjustable bed, with the ability to raise the head/feet of the bed. Has been compliant with HEP reporting shoulder is moving better with less pain with mobility.   PERTINENT HISTORY: Pt presenting with R shoulder pain s/p non operative proximal humerus fracture. Pt reports having a tent fall on her leading her to fall to the ground on her R side. Pt here with non-operative PT protocol. Reports orthopedic MD planning for 6-8 weeks of donning sling. Pt has history of R sided RTC tears and orthopedic MD stating return to full AROM in RUE may not be feasible. No MRI for RTC tissue quality as pt reports MD wants to see how pt manages with AROM with fracture recovery. Pt is R handed. Currently receiving HH aide for 4 hours in the morning to assist ADL's primarily dressing. No longer requiring pain meds except Tylenol PRN. Pain vastly has improved, worst pain nowadays is a 4/10 NPS sporadically. Has  been working on elbow extension in recliner, some numbness in hand at night palmar and dorsal. Rated as moderate, unsure of time of numbness.   PAIN:  Are you having pain? Yes: NPRS scale: 0/10 Pain location: R shoulder Pain description: Dull/achey Aggravating factors: RUE movement Relieving factors: Rest  PRECAUTIONS: See protocol in chart  WEIGHT BEARING RESTRICTIONS Yes WBAT on RUE  FALLS:  Has patient fallen in last 6 months? No  LIVING ENVIRONMENT: Lives with: lives with their family and lives alone Lives in: House/apartment Stairs: Yes: External: 3 steps; on right going up Has following equipment at home: None  OCCUPATION: Retired  PLOF: [place] motion/strength. Return to full independence.    OBJECTIVE:   DIAGNOSTIC FINDINGS:  XR Shoulder 3 Or More Views Right  Final Result   Acute, comminuted and anteriorly displaced fracture of the right proximal humerus with involvement of the surgical and anatomic necks and fragmentation of the greater tuberosity. Large fracture fragment containing the majority of the glenoid articular surface is rotated laterally.   PATIENT SURVEYS:  FOTO 19 with target of 16  COGNITION: Overall cognitive status: Within functional limits for tasks assessed     SENSATION: WFL  POSTURE: Upper trap activation most likely due to wearing sling. Overall WNL.    CERVICAL AROM:  Flexion: 50 degrees Extension: 45 degrees Rotation R/L: 50/45 degrees Lateral flexion (R/L): 30/35 degrees   UPPER EXTREMITY ROM:   Active ROM Right eval Left eval  Shoulder flexion PROM 74 Full  Shoulder extension    Shoulder abduction PROM 78 Full  Shoulder adduction    Shoulder internal rotation Deferred til week 6 per protocol Full  Shoulder external rotation 16 Full  Elbow flexion 129 Full  Elbow extension 30 from full ext 0 degrees   UPPER EXTREMITY MMT:    RUE deferred due to NWB status and in sling MMT  Right eval Left eval  Shoulder flexion  5/5  Shoulder extension    Shoulder abduction  5/5  Shoulder adduction    Shoulder  internal rotation  5/5  Shoulder external rotation  5/5  Middle trapezius    Lower trapezius    Elbow flexion  5/5  Elbow extension  5/5  Wrist flexion  5/5  Wrist extension  5/5  Wrist ulnar deviation    Wrist radial deviation    Wrist pronation    Wrist supination    Grip strength (lbs) 30.1 lbs (avg 2 trials) 36.5 lbs (avg 2 trials)  (Blank rows = not tested)   PALPATION: TTP along proximal humerus of RUE.     04/02/22:  R shoulder A/PROM in supine: flexion (94 deg.), abduction (92 deg.), ER (33 deg.), IR (78 deg.).  Supine R shoulder PROM flexion 124 deg. (Pain).  Standing R shoulder AROM: flexion (85 deg.), abduction (82 deg.).    04/17/22:  Yellow bulb grasping on R: 2 psi.  Handheld dynamometer: L 56.2#/ R 35.7#.    05/07/22:  Grip strength: L 61#/ R 42# (marked improvement).     TODAY'S TREATMENT (06/04/22)  SUBJECTIVE:   Pt. Tired after past couple of days and reports R shoulder is sore and required ice last night.        PAIN: 0/10 prior to tx. Session.   Pt.has returned to driving.   Ther-ex.:   06/04/22:    There.ex.:  B UBE 4 min. F/b (consistent cadence)- discussed MD appt./ functional tasks at home.   Seated pulley shoulder flexion/ abduction with holds 20x. Each.    Nautilus: seated lat. Pull downs 40#/ standing tricep extension 20#/ standing scap. Retraction 40# 20x each.  Standing 2# ex. Bicep curls 20x.    L sidelying R shoulder abduction/ horizontal abduction AROM 10x each.        Manual tx.:  Supine/ L sidelying R shoulder PROM to focus on flexion/ abduction.  R scapular mobs during shoulder ROM.  STM to R UT/posterior deltoid/ rhomboid musculature.    Supine R shoulder mobs. AP/PA/inf. Grade II-III.  3x20 sec. Each (as tolerated).     Pt. Will ice at home (as needed).       PATIENT EDUCATION: Education details:  form/technique with exercise and updated HEP Person educated: Patient Education method: Explanation, Demonstration, Tactile cues, Verbal cues, and Handouts Education comprehension: verbalized understanding, returned demonstration, and needs further education   HOME EXERCISE PROGRAM: Access Code: [email or web address]  Access Code: [email or web address] URL: [email or web address] Date: 03/12/2022 Prepared by: Dorcas Carrow   Exercises - Seated Shoulder Flexion AAROM with Pulley Behind  - 2 x daily - 7 x weekly - 2 sets - 10 reps - Seated Shoulder Scaption AAROM with Pulley at Side  - 2 x daily - 7 x weekly - 2 sets - 10 reps - Supine Shoulder Press with Dowel  - 1 x daily - 7 x weekly - 3 sets - 10 reps   Access Code: [email or web address] URL: [email or web address] Date: 05/07/2022 Prepared by: Dorcas Carrow  Exercises - Seated Shoulder Flexion AAROM with Pulley Behind  - 2 x daily - 7 x weekly - 2 sets - 10 reps - Seated Shoulder Scaption AAROM with Pulley at Side  - 2 x daily - 7 x weekly - 2 sets - 10 reps - Standing Shoulder Extension with Dowel  - 1 x daily - 7 x weekly - 2 sets - 10 reps - Standing Bilateral Shoulder Internal Rotation AAROM with Dowel  - 1 x daily - 7 x weekly - 2 sets - 10 reps - Standing Shoulder Flexion AAROM with Dowel  - 1  x daily - 7 x weekly - 2 sets - 10 reps - Supine Shoulder Press with Dowel  - 1 x daily - 7 x weekly - 2 sets - 10 reps - Scapular Retraction with Resistance  - 1 x daily - 5 x weekly - 1 sets - 20 reps - Shoulder extension with resistance - Neutral  - 1 x daily - 5 x weekly - 1 sets - 20 reps - Standing Shoulder Flexion with Resistance  - 1 x daily - 5 x weekly - 1 sets - 20 reps - Standing Single Arm Elbow Flexion with Resistance  - 1 x daily - 5 x weekly - 1 sets - 20 reps   ASSESSMENT:  CLINICAL IMPRESSION: Moderate R UT muscle tightness and R UT overcompensation with R shoulder reaching.  Tx. Focus on R shoulder ROM and light resisted  ex. Today.  Pt has limited scapular mobility due to shoulder ROM limitations.  No pain reported during tx. And pt. Motivated to increase shoulder to assist with daily tasks.  Pt. will benefit from skilled PT services to continue to address her decreased ROM and strength in RUE in order to optimize strength and function of RUE for return to full independence in all activities.    OBJECTIVE IMPAIRMENTS decreased mobility, decreased ROM, decreased strength, hypomobility, impaired flexibility, improper body mechanics, postural dysfunction, and pain.   ACTIVITY LIMITATIONS carrying, lifting, bathing, toileting, dressing, reach over head, and hygiene/grooming  PARTICIPATION LIMITATIONS: cleaning, driving, shopping, community activity, occupation, and yard work  PERSONAL FACTORS Age, Behavior pattern, Past/current experiences, Time since onset of injury/illness/exacerbation, Transportation, and 3+ comorbidities: HLD, lumbar DDD, OA of cervical spine, scoliosis  are also affecting patient's functional outcome.   REHAB POTENTIAL: Fair History of R sided RTC tears  CLINICAL DECISION MAKING: Evolving/moderate complexity  EVALUATION COMPLEXITY: Moderate   GOALS: Goals reviewed with patient? Yes  SHORT TERM GOALS:   Pt will be independent with HEP to improve R shoulder mobility and strength for ADL completion Baseline: Initiated and given.  9/21: see updated HEP with RTB (good technique) Goal status: Goal met  2.  Pt will improve R shoulder PROM to 90 degrees per protocol in prep for progression of AAROM of R shoulder. Baseline: 74 degrees.  R shoulder 95 deg.  9/21: 98 deg. AROM (pain) Goal status: Goal met   3.  Pt will improve R elbow extension to 0 degrees to prevent elbow contracture for ease of RUE ADL completion.  Baseline: Lacking 30 degrees from terminal extension.  9/21: -2 deg. extension Goal status: Goal met    LONG TERM GOALS: Target date: 06/25/22 unless otherwise posted  Pt  will display full PROM by 8 weeks to demonstrate full joint mobility Baseline: Limited in flexion, ER, Abduction Goal status: Not met Target Date: 06/25/2022  2.  Pt will improve FOTO to target score to demonstrate clinically significant improvement in functional mobility of RUE Baseline: 19 with target score of 55.  9/21: 60 (marked improvement)- limited with overhead reaching/ heavy household chores.   Goal status: Ongoing  3.  Pt will demonstrate at least 3/5 strength in RUE in flexion and abduction for overhead ADL completion Baseline: unable to test currently.  9/21:  R shoulder flexion 3-/5 (pain), bicep 4/5, tricep 4+/5, IR 4/5, ER 3-/5, extension 4+/5 MMT Goal status: Not met  4.  Pt will demonstrate > 120 degrees of RUE shoulder flexion to demonstrate adequate mobility to complete needed overhead ADL tasks  Baseline:  PROM of 74 degrees flexion, 78 PROM abduction.  9/21: 98 deg. AROM in seated posture Goal status:  Not met    PLAN: PT FREQUENCY: 2x/week  PT DURATION: 4 weeks  PLANNED INTERVENTIONS: Therapeutic exercises, Therapeutic activity, Neuromuscular re-education, Patient/Family education, Self Care, Joint mobilization, Dry Needling, Electrical stimulation, Spinal mobilization, Cryotherapy, Moist heat, and Manual therapy  PLAN FOR NEXT SESSION: Progress R shoulder mobility per protocol.  Strengthening focus.    Pura Spice, PT, DPT # [phone] Physical Therapist- [facility]  06/04/2022, 3:41 PM

## 2022-06-09 ENCOUNTER — Ambulatory Visit: Payer: Medicare HMO | Admitting: Physical Therapy

## 2022-06-09 DIAGNOSIS — M6281 Muscle weakness (generalized): Secondary | ICD-10-CM

## 2022-06-09 DIAGNOSIS — M25511 Pain in right shoulder: Secondary | ICD-10-CM

## 2022-06-09 DIAGNOSIS — S42201D Unspecified fracture of upper end of right humerus, subsequent encounter for fracture with routine healing: Secondary | ICD-10-CM | POA: Diagnosis not present

## 2022-06-09 NOTE — Therapy (Signed)
OUTPATIENT PHYSICAL THERAPY SHOULDER TREATMENT  Patient Name: Jennifer Morrow MRN: [id number] DOB:[birth year], 71 y.o., female Today's Date: 06/09/22   PT End of Session - 06/09/22 0911     Visit Number 21    Number of Visits 25    Date for PT Re-Evaluation 06/25/22    PT Start Time 0905    PT Stop Time 0950    PT Time Calculation (min) 45 min    Activity Tolerance Patient tolerated treatment well;Patient limited by pain    Behavior During Therapy [facility] for tasks assessed/performed             Past Medical History:  Diagnosis Date   Arthritis    Bronchitis    CHRONIC   Bulging lumbar disc    degenerative dics   Scoliosis    LIMITS LUNG FUNCTION   Spinal stenosis    Past Surgical History:  Procedure Laterality Date   CATARACT EXTRACTION W/PHACO Left 01/06/2018   Procedure: CATARACT EXTRACTION PHACO AND INTRAOCULAR LENS PLACEMENT ([place]);  Surgeon: Eulogio Bear, MD;  Location: [facility] ORS;  Service: Ophthalmology;  Laterality: Left;  [place] 00:27.5 AP% 4.3 CDE 1.18 FLUID PACK LOT # [id number]   CATARACT EXTRACTION W/PHACO Right 02/10/2018   Procedure: CATARACT EXTRACTION PHACO AND INTRAOCULAR LENS PLACEMENT (IOC);  Surgeon: Eulogio Bear, MD;  Location: [facility] ORS;  Service: Ophthalmology;  Laterality: Right;  [place] 00:30.5 AP% 5.0 CDE 1.51  Fluid pack lot # [id number]   FOOT X 2     HEEL SPUR EXCISION     TONSILLECTOMY     Patient Active Problem List   Diagnosis Date Noted   Osteoarthritis cervical spine 01/14/2022   Age-related osteoporosis without current pathological fracture 05/19/2021   Spondylosis of lumbar region without myelopathy or radiculopathy 05/19/2021   Degenerative disc disease, lumbar 05/19/2021   Scoliosis, unspecified 01/09/2015   Vitamin D deficiency 01/08/2014   Mixed hyperlipidemia 01/08/2014    PCP: Halina Maidens MD  REFERRING PROVIDER: Renee Harder MD  REFERRING DIAG: Closed fracture of proximal right humerus   THERAPY DIAG:  Acute  pain of right shoulder  Muscle weakness (generalized)  Closed fracture of proximal end of right humerus with routine healing, unspecified fracture morphology, subsequent encounter  Rationale for Evaluation and Treatment Rehabilitation  ONSET DATE: 01/30/22  SUBJECTIVE:                                                                                                                                                                                      SUBJECTIVE STATEMENT:  EVALUATION Pt is s/p R proximal humerus fracture on 01/30/22 for conservative management. Pt has been doing her HEP,  and is able to roll briefly onto her L shoulder while sleeping. 0/10 pain prior to tx. Pt has pain while dressing herself, and is not able to fully independently don/doff a shirt yet. Pt stated that she has pain in her R low back, with numbness down her LLE and L low back but is chronic pain she has dealt with for years. Pt currently sleeps in adjustable bed, with the ability to raise the head/feet of the bed. Has been compliant with HEP reporting shoulder is moving better with less pain with mobility.   PERTINENT HISTORY: Pt presenting with R shoulder pain s/p non operative proximal humerus fracture. Pt reports having a tent fall on her leading her to fall to the ground on her R side. Pt here with non-operative PT protocol. Reports orthopedic MD planning for 6-8 weeks of donning sling. Pt has history of R sided RTC tears and orthopedic MD stating return to full AROM in RUE may not be feasible. No MRI for RTC tissue quality as pt reports MD wants to see how pt manages with AROM with fracture recovery. Pt is R handed. Currently receiving HH aide for 4 hours in the morning to assist ADL's primarily dressing. No longer requiring pain meds except Tylenol PRN. Pain vastly has improved, worst pain nowadays is a 4/10 NPS sporadically. Has been working on elbow extension in recliner, some numbness in hand at night palmar and  dorsal. Rated as moderate, unsure of time of numbness.   PAIN:  Are you having pain? Yes: NPRS scale: 0/10 Pain location: R shoulder Pain description: Dull/achey Aggravating factors: RUE movement Relieving factors: Rest  PRECAUTIONS: See protocol in chart  WEIGHT BEARING RESTRICTIONS Yes WBAT on RUE  FALLS:  Has patient fallen in last 6 months? No  LIVING ENVIRONMENT: Lives with: lives with their family and lives alone Lives in: House/apartment Stairs: Yes: External: 3 steps; on right going up Has following equipment at home: None  OCCUPATION: Retired  PLOF: [place] motion/strength. Return to full independence.    OBJECTIVE:   DIAGNOSTIC FINDINGS:  XR Shoulder 3 Or More Views Right  Final Result   Acute, comminuted and anteriorly displaced fracture of the right proximal humerus with involvement of the surgical and anatomic necks and fragmentation of the greater tuberosity. Large fracture fragment containing the majority of the glenoid articular surface is rotated laterally.   PATIENT SURVEYS:  FOTO 19 with target of 67  COGNITION: Overall cognitive status: Within functional limits for tasks assessed     SENSATION: WFL  POSTURE: Upper trap activation most likely due to wearing sling. Overall WNL.    CERVICAL AROM:  Flexion: 50 degrees Extension: 45 degrees Rotation R/L: 50/45 degrees Lateral flexion (R/L): 30/35 degrees   UPPER EXTREMITY ROM:   Active ROM Right eval Left eval  Shoulder flexion PROM 74 Full  Shoulder extension    Shoulder abduction PROM 78 Full  Shoulder adduction    Shoulder internal rotation Deferred til week 6 per protocol Full  Shoulder external rotation 16 Full  Elbow flexion 129 Full  Elbow extension 30 from full ext 0 degrees   UPPER EXTREMITY MMT:    RUE deferred due to NWB status and in sling MMT Right eval Left eval  Shoulder flexion  5/5  Shoulder extension    Shoulder abduction   5/5  Shoulder adduction    Shoulder internal rotation  5/5  Shoulder external rotation  5/5  Middle trapezius  Lower trapezius    Elbow flexion  5/5  Elbow extension  5/5  Wrist flexion  5/5  Wrist extension  5/5  Wrist ulnar deviation    Wrist radial deviation    Wrist pronation    Wrist supination    Grip strength (lbs) 30.1 lbs (avg 2 trials) 36.5 lbs (avg 2 trials)  (Blank rows = not tested)   PALPATION: TTP along proximal humerus of RUE.     04/02/22:  R shoulder A/PROM in supine: flexion (94 deg.), abduction (92 deg.), ER (33 deg.), IR (78 deg.).  Supine R shoulder PROM flexion 124 deg. (Pain).  Standing R shoulder AROM: flexion (85 deg.), abduction (82 deg.).    04/17/22:  Yellow bulb grasping on R: 2 psi.  Handheld dynamometer: L 56.2#/ R 35.7#.    05/07/22:  Grip strength: L 61#/ R 42# (marked improvement).     TODAY'S TREATMENT (06/09/22)  SUBJECTIVE:   Pt. States she has continued to be busy with daily activities and reports increase shoulder discomfort/ swelling at night.       PAIN: 0/10 prior to tx. Session.   Pt.has returned to driving.   Ther-ex.:   06/09/22:    There.ex.:  B UBE 4 min. F/b (consistent cadence)- discussed weekend activities.   Standing wand ex. At mirror AAROM:  flexion/ extension/ IR/ abduction/ chest press 20x each.    Nautilus: seated lat. Pull downs 40#/ standing tricep extension 30#/ standing scap. Retraction 40#/ chest press 20#/ shoulder adduction with handles 20# 20x each.    Supine R shoulder serratus punches/ punches/ bicep curls 20x each.    L sidelying R shoulder abduction/ horizontal abduction AROM 10x each.        Manual tx.:  Supine/ L sidelying R shoulder PROM to focus on flexion/ abduction.  STM to R UT/posterior deltoid/ rhomboid musculature.    Supine R shoulder mobs. AP/PA/inf. Grade II-III.  3x20 sec. Each (as tolerated).     Pt. Will ice at home (as needed).       PATIENT EDUCATION: Education  details: form/technique with exercise and updated HEP Person educated: Patient Education method: Explanation, Demonstration, Tactile cues, Verbal cues, and Handouts Education comprehension: verbalized understanding, returned demonstration, and needs further education   HOME EXERCISE PROGRAM: Access Code: [email or web address]  Access Code: [email or web address] URL: [email or web address] Date: 03/12/2022 Prepared by: Dorcas Carrow   Exercises - Seated Shoulder Flexion AAROM with Pulley Behind  - 2 x daily - 7 x weekly - 2 sets - 10 reps - Seated Shoulder Scaption AAROM with Pulley at Side  - 2 x daily - 7 x weekly - 2 sets - 10 reps - Supine Shoulder Press with Dowel  - 1 x daily - 7 x weekly - 3 sets - 10 reps   Access Code: [email or web address] URL: [email or web address] Date: 05/07/2022 Prepared by: Dorcas Carrow  Exercises - Seated Shoulder Flexion AAROM with Pulley Behind  - 2 x daily - 7 x weekly - 2 sets - 10 reps - Seated Shoulder Scaption AAROM with Pulley at Side  - 2 x daily - 7 x weekly - 2 sets - 10 reps - Standing Shoulder Extension with Dowel  - 1 x daily - 7 x weekly - 2 sets - 10 reps - Standing Bilateral Shoulder Internal Rotation AAROM with Dowel  - 1 x daily - 7 x weekly - 2 sets - 10 reps - Standing Shoulder Flexion AAROM with Dowel  - 1 x daily - 7 x weekly -  2 sets - 10 reps - Supine Shoulder Press with Dowel  - 1 x daily - 7 x weekly - 2 sets - 10 reps - Scapular Retraction with Resistance  - 1 x daily - 5 x weekly - 1 sets - 20 reps - Shoulder extension with resistance - Neutral  - 1 x daily - 5 x weekly - 1 sets - 20 reps - Standing Shoulder Flexion with Resistance  - 1 x daily - 5 x weekly - 1 sets - 20 reps - Standing Single Arm Elbow Flexion with Resistance  - 1 x daily - 5 x weekly - 1 sets - 20 reps   ASSESSMENT:  CLINICAL IMPRESSION: Tx. Focus on R shoulder ROM and light resisted ex. Today.  Pt has limited scapular mobility due to shoulder ROM  limitations.  No pain reported during tx. And pt. Motivated to increase shoulder to assist with daily tasks.  Pt. will benefit from skilled PT services to continue to address her decreased ROM and strength in RUE in order to optimize strength and function of RUE for return to full independence in all activities.    OBJECTIVE IMPAIRMENTS decreased mobility, decreased ROM, decreased strength, hypomobility, impaired flexibility, improper body mechanics, postural dysfunction, and pain.   ACTIVITY LIMITATIONS carrying, lifting, bathing, toileting, dressing, reach over head, and hygiene/grooming  PARTICIPATION LIMITATIONS: cleaning, driving, shopping, community activity, occupation, and yard work  PERSONAL FACTORS Age, Behavior pattern, Past/current experiences, Time since onset of injury/illness/exacerbation, Transportation, and 3+ comorbidities: HLD, lumbar DDD, OA of cervical spine, scoliosis  are also affecting patient's functional outcome.   REHAB POTENTIAL: Fair History of R sided RTC tears  CLINICAL DECISION MAKING: Evolving/moderate complexity  EVALUATION COMPLEXITY: Moderate   GOALS: Goals reviewed with patient? Yes  SHORT TERM GOALS:   Pt will be independent with HEP to improve R shoulder mobility and strength for ADL completion Baseline: Initiated and given.  9/21: see updated HEP with RTB (good technique) Goal status: Goal met  2.  Pt will improve R shoulder PROM to 90 degrees per protocol in prep for progression of AAROM of R shoulder. Baseline: 74 degrees.  R shoulder 95 deg.  9/21: 98 deg. AROM (pain) Goal status: Goal met   3.  Pt will improve R elbow extension to 0 degrees to prevent elbow contracture for ease of RUE ADL completion.  Baseline: Lacking 30 degrees from terminal extension.  9/21: -2 deg. extension Goal status: Goal met    LONG TERM GOALS: Target date: 06/25/22 unless otherwise posted  Pt will display full PROM by 8 weeks to demonstrate full joint  mobility Baseline: Limited in flexion, ER, Abduction Goal status: Not met Target Date: 06/25/2022  2.  Pt will improve FOTO to target score to demonstrate clinically significant improvement in functional mobility of RUE Baseline: 19 with target score of 55.  9/21: 60 (marked improvement)- limited with overhead reaching/ heavy household chores.   Goal status: Ongoing  3.  Pt will demonstrate at least 3/5 strength in RUE in flexion and abduction for overhead ADL completion Baseline: unable to test currently.  9/21:  R shoulder flexion 3-/5 (pain), bicep 4/5, tricep 4+/5, IR 4/5, ER 3-/5, extension 4+/5 MMT Goal status: Not met  4.  Pt will demonstrate > 120 degrees of RUE shoulder flexion to demonstrate adequate mobility to complete needed overhead ADL tasks  Baseline: PROM of 74 degrees flexion, 78 PROM abduction.  9/21: 98 deg. AROM in seated posture Goal status:  Not met  PLAN: PT FREQUENCY: 2x/week  PT DURATION: 4 weeks  PLANNED INTERVENTIONS: Therapeutic exercises, Therapeutic activity, Neuromuscular re-education, Patient/Family education, Self Care, Joint mobilization, Dry Needling, Electrical stimulation, Spinal mobilization, Cryotherapy, Moist heat, and Manual therapy  PLAN FOR NEXT SESSION: Progress R shoulder mobility per protocol.  Strengthening focus.    Pura Spice, PT, DPT # [id number] Physical Therapist- [facility]  06/09/2022, 8:52 PM

## 2022-06-16 ENCOUNTER — Ambulatory Visit: Payer: Medicare HMO | Admitting: Physical Therapy

## 2022-06-16 ENCOUNTER — Encounter: Payer: Self-pay | Admitting: Physical Therapy

## 2022-06-16 DIAGNOSIS — S42201D Unspecified fracture of upper end of right humerus, subsequent encounter for fracture with routine healing: Secondary | ICD-10-CM

## 2022-06-16 DIAGNOSIS — M6281 Muscle weakness (generalized): Secondary | ICD-10-CM

## 2022-06-16 DIAGNOSIS — M25511 Pain in right shoulder: Secondary | ICD-10-CM | POA: Diagnosis not present

## 2022-06-16 NOTE — Therapy (Signed)
OUTPATIENT PHYSICAL THERAPY SHOULDER TREATMENT  Patient Name: Jennifer Morrow MRN: [id number] DOB:July 01, 1951, 71 y.o., female Today's Date: 06/16/22   PT End of Session - 06/16/22 0909     Visit Number 22    Number of Visits 25    Date for PT Re-Evaluation 06/25/22    PT Start Time 0901    PT Stop Time 0947    PT Time Calculation (min) 46 min    Activity Tolerance Patient tolerated treatment well;Patient limited by pain    Behavior During Therapy [facility] for tasks assessed/performed             Past Medical History:  Diagnosis Date   Arthritis    Bronchitis    CHRONIC   Bulging lumbar disc    degenerative dics   Scoliosis    LIMITS LUNG FUNCTION   Spinal stenosis    Past Surgical History:  Procedure Laterality Date   CATARACT EXTRACTION W/PHACO Left 01/06/2018   Procedure: CATARACT EXTRACTION PHACO AND INTRAOCULAR LENS PLACEMENT ([place]);  Surgeon: Eulogio Bear, MD;  Location: [facility] ORS;  Service: Ophthalmology;  Laterality: Left;  [place] 00:27.5 AP% 4.3 CDE 1.18 FLUID PACK LOT # [id number]   CATARACT EXTRACTION W/PHACO Right 02/10/2018   Procedure: CATARACT EXTRACTION PHACO AND INTRAOCULAR LENS PLACEMENT ([facility]);  Surgeon: Eulogio Bear, MD;  Location: [facility] ORS;  Service: Ophthalmology;  Laterality: Right;  [place] 00:30.5 AP% 5.0 CDE 1.51  Fluid pack lot # [id number]   FOOT X 2     HEEL SPUR EXCISION     TONSILLECTOMY     Patient Active Problem List   Diagnosis Date Noted   Osteoarthritis cervical spine 01/14/2022   Age-related osteoporosis without current pathological fracture 05/19/2021   Spondylosis of lumbar region without myelopathy or radiculopathy 05/19/2021   Degenerative disc disease, lumbar 05/19/2021   Scoliosis, unspecified 01/09/2015   Vitamin D deficiency 01/08/2014   Mixed hyperlipidemia 01/08/2014    PCP: Halina Maidens MD  REFERRING PROVIDER: Renee Harder MD  REFERRING DIAG: Closed fracture of proximal right humerus   THERAPY DIAG:  Acute  pain of right shoulder  Muscle weakness (generalized)  Closed fracture of proximal end of right humerus with routine healing, unspecified fracture morphology, subsequent encounter  Rationale for Evaluation and Treatment Rehabilitation  ONSET DATE: 01/30/22  SUBJECTIVE:                                                                                                                                                                                      SUBJECTIVE STATEMENT:  EVALUATION Pt is s/p R proximal humerus fracture on 01/30/22 for conservative management. Pt has been doing her HEP,  and is able to roll briefly onto her L shoulder while sleeping. 0/10 pain prior to tx. Pt has pain while dressing herself, and is not able to fully independently don/doff a shirt yet. Pt stated that she has pain in her R low back, with numbness down her LLE and L low back but is chronic pain she has dealt with for years. Pt currently sleeps in adjustable bed, with the ability to raise the head/feet of the bed. Has been compliant with HEP reporting shoulder is moving better with less pain with mobility.   PERTINENT HISTORY: Pt presenting with R shoulder pain s/p non operative proximal humerus fracture. Pt reports having a tent fall on her leading her to fall to the ground on her R side. Pt here with non-operative PT protocol. Reports orthopedic MD planning for 6-8 weeks of donning sling. Pt has history of R sided RTC tears and orthopedic MD stating return to full AROM in RUE may not be feasible. No MRI for RTC tissue quality as pt reports MD wants to see how pt manages with AROM with fracture recovery. Pt is R handed. Currently receiving HH aide for 4 hours in the morning to assist ADL's primarily dressing. No longer requiring pain meds except Tylenol PRN. Pain vastly has improved, worst pain nowadays is a 4/10 NPS sporadically. Has been working on elbow extension in recliner, some numbness in hand at night palmar and  dorsal. Rated as moderate, unsure of time of numbness.   PAIN:  Are you having pain? Yes: NPRS scale: 0/10 Pain location: R shoulder Pain description: Dull/achey Aggravating factors: RUE movement Relieving factors: Rest  PRECAUTIONS: See protocol in chart  WEIGHT BEARING RESTRICTIONS Yes WBAT on RUE  FALLS:  Has patient fallen in last 6 months? No  LIVING ENVIRONMENT: Lives with: lives with their family and lives alone Lives in: House/apartment Stairs: Yes: External: 3 steps; on right going up Has following equipment at home: None  OCCUPATION: Retired  PLOF: [place] motion/strength. Return to full independence.    OBJECTIVE:   DIAGNOSTIC FINDINGS:  XR Shoulder 3 Or More Views Right  Final Result   Acute, comminuted and anteriorly displaced fracture of the right proximal humerus with involvement of the surgical and anatomic necks and fragmentation of the greater tuberosity. Large fracture fragment containing the majority of the glenoid articular surface is rotated laterally.   PATIENT SURVEYS:  FOTO 19 with target of 67  COGNITION: Overall cognitive status: Within functional limits for tasks assessed     SENSATION: WFL  POSTURE: Upper trap activation most likely due to wearing sling. Overall WNL.    CERVICAL AROM:  Flexion: 50 degrees Extension: 45 degrees Rotation R/L: 50/45 degrees Lateral flexion (R/L): 30/35 degrees   UPPER EXTREMITY ROM:   Active ROM Right eval Left eval  Shoulder flexion PROM 74 Full  Shoulder extension    Shoulder abduction PROM 78 Full  Shoulder adduction    Shoulder internal rotation Deferred til week 6 per protocol Full  Shoulder external rotation 16 Full  Elbow flexion 129 Full  Elbow extension 30 from full ext 0 degrees   UPPER EXTREMITY MMT:    RUE deferred due to NWB status and in sling MMT Right eval Left eval  Shoulder flexion  5/5  Shoulder extension    Shoulder abduction   5/5  Shoulder adduction    Shoulder internal rotation  5/5  Shoulder external rotation  5/5  Middle trapezius  Lower trapezius    Elbow flexion  5/5  Elbow extension  5/5  Wrist flexion  5/5  Wrist extension  5/5  Wrist ulnar deviation    Wrist radial deviation    Wrist pronation    Wrist supination    Grip strength (lbs) 30.1 lbs (avg 2 trials) 36.5 lbs (avg 2 trials)  (Blank rows = not tested)   PALPATION: TTP along proximal humerus of RUE.     04/02/22:  R shoulder A/PROM in supine: flexion (94 deg.), abduction (92 deg.), ER (33 deg.), IR (78 deg.).  Supine R shoulder PROM flexion 124 deg. (Pain).  Standing R shoulder AROM: flexion (85 deg.), abduction (82 deg.).    04/17/22:  Yellow bulb grasping on R: 2 psi.  Handheld dynamometer: L 56.2#/ R 35.7#.    05/07/22:  Grip strength: L 61#/ R 42# (marked improvement).     TODAY'S TREATMENT (06/16/22)  SUBJECTIVE:   Pt. States she has continued to be busy with daily activities and reports increase shoulder discomfort/ swelling at night, which benefits from icing.   Pt. States she is now able to put R hand behind head to complete a ponytail.       PAIN: 0/10 prior to tx. Session.      Ther-ex.:   06/16/22:    There.ex.:  B UBE 4 min. F/b (consistent cadence)- discussed weekend activities.   Standing wand ex. At mirror AAROM:  flexion/ extension/ IR 20x each.    Nautilus: seated lat. Pull downs 40#/ standing tricep extension 30#/ shoulder extension 30#/ standing scap. Retraction 40#/ chest press 20#/ shoulder adduction with handles 20# 20x each.    Seated B shoulder chest press with wand 20x.  Standing 3# bicep curls 20x.  PT assist for full R elbow extension/ control.       Discussed HEP   Pt. Will ice at home (as needed).       PATIENT EDUCATION: Education details: form/technique with exercise and updated HEP Person educated: Patient Education method: Explanation, Demonstration, Tactile cues, Verbal cues, and  Handouts Education comprehension: verbalized understanding, returned demonstration, and needs further education   HOME EXERCISE PROGRAM: Access Code: [email or web address]  Access Code: [email or web address] URL: [email or web address] Date: 03/12/2022 Prepared by: Dorcas Carrow   Exercises - Seated Shoulder Flexion AAROM with Pulley Behind  - 2 x daily - 7 x weekly - 2 sets - 10 reps - Seated Shoulder Scaption AAROM with Pulley at Side  - 2 x daily - 7 x weekly - 2 sets - 10 reps - Supine Shoulder Press with Dowel  - 1 x daily - 7 x weekly - 3 sets - 10 reps   Access Code: [email or web address] URL: [email or web address] Date: 05/07/2022 Prepared by: Dorcas Carrow  Exercises - Seated Shoulder Flexion AAROM with Pulley Behind  - 2 x daily - 7 x weekly - 2 sets - 10 reps - Seated Shoulder Scaption AAROM with Pulley at Side  - 2 x daily - 7 x weekly - 2 sets - 10 reps - Standing Shoulder Extension with Dowel  - 1 x daily - 7 x weekly - 2 sets - 10 reps - Standing Bilateral Shoulder Internal Rotation AAROM with Dowel  - 1 x daily - 7 x weekly - 2 sets - 10 reps - Standing Shoulder Flexion AAROM with Dowel  - 1 x daily - 7 x weekly - 2 sets - 10 reps - Supine Shoulder Press with Dowel  - 1 x daily - 7 x weekly -  2 sets - 10 reps - Scapular Retraction with Resistance  - 1 x daily - 5 x weekly - 1 sets - 20 reps - Shoulder extension with resistance - Neutral  - 1 x daily - 5 x weekly - 1 sets - 20 reps - Standing Shoulder Flexion with Resistance  - 1 x daily - 5 x weekly - 1 sets - 20 reps - Standing Single Arm Elbow Flexion with Resistance  - 1 x daily - 5 x weekly - 1 sets - 20 reps   ASSESSMENT:  CLINICAL IMPRESSION: Tx. Focus on R shoulder ROM and standing resisted ex. At [employer].  Pt has limited scapular mobility due to shoulder ROM limitations.  No pain reported during tx. And pt. Motivated to increase shoulder to assist with daily tasks.  Pt. will benefit from skilled PT services to  continue to address her decreased ROM and strength in RUE in order to optimize strength and function of RUE for return to full independence in all activities.    OBJECTIVE IMPAIRMENTS decreased mobility, decreased ROM, decreased strength, hypomobility, impaired flexibility, improper body mechanics, postural dysfunction, and pain.   ACTIVITY LIMITATIONS carrying, lifting, bathing, toileting, dressing, reach over head, and hygiene/grooming  PARTICIPATION LIMITATIONS: cleaning, driving, shopping, community activity, occupation, and yard work  PERSONAL FACTORS Age, Behavior pattern, Past/current experiences, Time since onset of injury/illness/exacerbation, Transportation, and 3+ comorbidities: HLD, lumbar DDD, OA of cervical spine, scoliosis  are also affecting patient's functional outcome.   REHAB POTENTIAL: Fair History of R sided RTC tears  CLINICAL DECISION MAKING: Evolving/moderate complexity  EVALUATION COMPLEXITY: Moderate   GOALS: Goals reviewed with patient? Yes  SHORT TERM GOALS:   Pt will be independent with HEP to improve R shoulder mobility and strength for ADL completion Baseline: Initiated and given.  9/21: see updated HEP with RTB (good technique) Goal status: Goal met  2.  Pt will improve R shoulder PROM to 90 degrees per protocol in prep for progression of AAROM of R shoulder. Baseline: 74 degrees.  R shoulder 95 deg.  9/21: 98 deg. AROM (pain) Goal status: Goal met   3.  Pt will improve R elbow extension to 0 degrees to prevent elbow contracture for ease of RUE ADL completion.  Baseline: Lacking 30 degrees from terminal extension.  9/21: -2 deg. extension Goal status: Goal met    LONG TERM GOALS: Target date: 06/25/22 unless otherwise posted  Pt will display full PROM by 8 weeks to demonstrate full joint mobility Baseline: Limited in flexion, ER, Abduction Goal status: Not met Target Date: 06/25/2022  2.  Pt will improve FOTO to target score to demonstrate  clinically significant improvement in functional mobility of RUE Baseline: 19 with target score of 55.  9/21: 60 (marked improvement)- limited with overhead reaching/ heavy household chores.   Goal status: Ongoing  3.  Pt will demonstrate at least 3/5 strength in RUE in flexion and abduction for overhead ADL completion Baseline: unable to test currently.  9/21:  R shoulder flexion 3-/5 (pain), bicep 4/5, tricep 4+/5, IR 4/5, ER 3-/5, extension 4+/5 MMT Goal status: Not met  4.  Pt will demonstrate > 120 degrees of RUE shoulder flexion to demonstrate adequate mobility to complete needed overhead ADL tasks  Baseline: PROM of 74 degrees flexion, 78 PROM abduction.  9/21: 98 deg. AROM in seated posture Goal status:  Not met    PLAN: PT FREQUENCY: 2x/week  PT DURATION: 4 weeks  PLANNED INTERVENTIONS: Therapeutic exercises, Therapeutic activity, Neuromuscular  re-education, Patient/Family education, Self Care, Joint mobilization, Dry Needling, Electrical stimulation, Spinal mobilization, Cryotherapy, Moist heat, and Manual therapy  PLAN FOR NEXT SESSION: Progress R shoulder mobility per protocol.  Strengthening focus.    Pura Spice, PT, DPT # [phone] Physical Therapist- [facility]  06/16/2022, 1:23 PM

## 2022-06-18 ENCOUNTER — Encounter: Payer: Medicare HMO | Admitting: Physical Therapy

## 2022-06-18 DIAGNOSIS — M5134 Other intervertebral disc degeneration, thoracic region: Secondary | ICD-10-CM | POA: Diagnosis not present

## 2022-06-18 DIAGNOSIS — M5116 Intervertebral disc disorders with radiculopathy, lumbar region: Secondary | ICD-10-CM | POA: Diagnosis not present

## 2022-06-18 DIAGNOSIS — M4726 Other spondylosis with radiculopathy, lumbar region: Secondary | ICD-10-CM | POA: Diagnosis not present

## 2022-06-18 DIAGNOSIS — M419 Scoliosis, unspecified: Secondary | ICD-10-CM | POA: Diagnosis not present

## 2022-06-18 DIAGNOSIS — M47813 Spondylosis without myelopathy or radiculopathy, cervicothoracic region: Secondary | ICD-10-CM | POA: Diagnosis not present

## 2022-06-18 DIAGNOSIS — M48061 Spinal stenosis, lumbar region without neurogenic claudication: Secondary | ICD-10-CM | POA: Diagnosis not present

## 2022-06-18 DIAGNOSIS — M4186 Other forms of scoliosis, lumbar region: Secondary | ICD-10-CM | POA: Diagnosis not present

## 2022-06-18 DIAGNOSIS — G8929 Other chronic pain: Secondary | ICD-10-CM | POA: Diagnosis not present

## 2022-06-18 DIAGNOSIS — M5442 Lumbago with sciatica, left side: Secondary | ICD-10-CM | POA: Diagnosis not present

## 2022-06-18 DIAGNOSIS — Z79899 Other long term (current) drug therapy: Secondary | ICD-10-CM | POA: Diagnosis not present

## 2022-06-18 DIAGNOSIS — M5030 Other cervical disc degeneration, unspecified cervical region: Secondary | ICD-10-CM | POA: Diagnosis not present

## 2022-06-18 DIAGNOSIS — M170 Bilateral primary osteoarthritis of knee: Secondary | ICD-10-CM | POA: Diagnosis not present

## 2022-06-23 ENCOUNTER — Ambulatory Visit: Payer: Medicare HMO | Attending: Orthopaedic Surgery | Admitting: Physical Therapy

## 2022-06-23 DIAGNOSIS — M25511 Pain in right shoulder: Secondary | ICD-10-CM | POA: Diagnosis not present

## 2022-06-23 DIAGNOSIS — S42201D Unspecified fracture of upper end of right humerus, subsequent encounter for fracture with routine healing: Secondary | ICD-10-CM | POA: Diagnosis not present

## 2022-06-23 DIAGNOSIS — M6281 Muscle weakness (generalized): Secondary | ICD-10-CM | POA: Diagnosis not present

## 2022-06-23 DIAGNOSIS — M5459 Other low back pain: Secondary | ICD-10-CM

## 2022-06-23 NOTE — Therapy (Signed)
OUTPATIENT PHYSICAL THERAPY SHOULDER TREATMENT  Patient Name: Jennifer Morrow MRN: [id number] DOB:[birth year], 71 y.o., female Today's Date: 06/23/22   PT End of Session - 06/23/22 0904     Visit Number 23    Number of Visits 25    Date for PT Re-Evaluation 06/25/22    PT Start Time 0900    Activity Tolerance Patient tolerated treatment well;Patient limited by pain    Behavior During Therapy [facility] for tasks assessed/performed            0900 to 0946  (46 minutes).     Past Medical History:  Diagnosis Date   Arthritis    Bronchitis    CHRONIC   Bulging lumbar disc    degenerative dics   Scoliosis    LIMITS LUNG FUNCTION   Spinal stenosis    Past Surgical History:  Procedure Laterality Date   CATARACT EXTRACTION W/PHACO Left 01/06/2018   Procedure: CATARACT EXTRACTION PHACO AND INTRAOCULAR LENS PLACEMENT ([place]);  Surgeon: Eulogio Bear, MD;  Location: [facility] ORS;  Service: Ophthalmology;  Laterality: Left;  [place] 00:27.5 AP% 4.3 CDE 1.18 FLUID PACK LOT # [id number]   CATARACT EXTRACTION W/PHACO Right 02/10/2018   Procedure: CATARACT EXTRACTION PHACO AND INTRAOCULAR LENS PLACEMENT ([facility]);  Surgeon: Eulogio Bear, MD;  Location: [facility] ORS;  Service: Ophthalmology;  Laterality: Right;  [place] 00:30.5 AP% 5.0 CDE 1.51  Fluid pack lot # [id number]   FOOT X 2     HEEL SPUR EXCISION     TONSILLECTOMY     Patient Active Problem List   Diagnosis Date Noted   Osteoarthritis cervical spine 01/14/2022   Age-related osteoporosis without current pathological fracture 05/19/2021   Spondylosis of lumbar region without myelopathy or radiculopathy 05/19/2021   Degenerative disc disease, lumbar 05/19/2021   Scoliosis, unspecified 01/09/2015   Vitamin D deficiency 01/08/2014   Mixed hyperlipidemia 01/08/2014    PCP: Halina Maidens MD  REFERRING PROVIDER: Renee Harder MD  REFERRING DIAG: Closed fracture of proximal right humerus   THERAPY DIAG:  Acute pain of right  shoulder  Muscle weakness (generalized)  Closed fracture of proximal end of right humerus with routine healing, unspecified fracture morphology, subsequent encounter  Other low back pain  Rationale for Evaluation and Treatment Rehabilitation  ONSET DATE: 01/30/22  SUBJECTIVE:                                                                                                                                                                                      SUBJECTIVE STATEMENT:  EVALUATION Pt is s/p R proximal humerus fracture on 01/30/22 for conservative management. Pt has been doing her HEP, and is  able to roll briefly onto her L shoulder while sleeping. 0/10 pain prior to tx. Pt has pain while dressing herself, and is not able to fully independently don/doff a shirt yet. Pt stated that she has pain in her R low back, with numbness down her LLE and L low back but is chronic pain she has dealt with for years. Pt currently sleeps in adjustable bed, with the ability to raise the head/feet of the bed. Has been compliant with HEP reporting shoulder is moving better with less pain with mobility.   PERTINENT HISTORY: Pt presenting with R shoulder pain s/p non operative proximal humerus fracture. Pt reports having a tent fall on her leading her to fall to the ground on her R side. Pt here with non-operative PT protocol. Reports orthopedic MD planning for 6-8 weeks of donning sling. Pt has history of R sided RTC tears and orthopedic MD stating return to full AROM in RUE may not be feasible. No MRI for RTC tissue quality as pt reports MD wants to see how pt manages with AROM with fracture recovery. Pt is R handed. Currently receiving HH aide for 4 hours in the morning to assist ADL's primarily dressing. No longer requiring pain meds except Tylenol PRN. Pain vastly has improved, worst pain nowadays is a 4/10 NPS sporadically. Has been working on elbow extension in recliner, some numbness in hand at night palmar  and dorsal. Rated as moderate, unsure of time of numbness.   PAIN:  Are you having pain? Yes: NPRS scale: 0/10 Pain location: R shoulder Pain description: Dull/achey Aggravating factors: RUE movement Relieving factors: Rest  PRECAUTIONS: See protocol in chart  WEIGHT BEARING RESTRICTIONS Yes WBAT on RUE  FALLS:  Has patient fallen in last 6 months? No  LIVING ENVIRONMENT: Lives with: lives with their family and lives alone Lives in: House/apartment Stairs: Yes: External: 3 steps; on right going up Has following equipment at home: None  OCCUPATION: Retired  PLOF: [place] motion/strength. Return to full independence.    OBJECTIVE:   DIAGNOSTIC FINDINGS:  XR Shoulder 3 Or More Views Right  Final Result   Acute, comminuted and anteriorly displaced fracture of the right proximal humerus with involvement of the surgical and anatomic necks and fragmentation of the greater tuberosity. Large fracture fragment containing the majority of the glenoid articular surface is rotated laterally.   PATIENT SURVEYS:  FOTO 19 with target of 25  COGNITION: Overall cognitive status: Within functional limits for tasks assessed     SENSATION: WFL  POSTURE: Upper trap activation most likely due to wearing sling. Overall WNL.    CERVICAL AROM:  Flexion: 50 degrees Extension: 45 degrees Rotation R/L: 50/45 degrees Lateral flexion (R/L): 30/35 degrees   UPPER EXTREMITY ROM:   Active ROM Right eval Left eval  Shoulder flexion PROM 74 Full  Shoulder extension    Shoulder abduction PROM 78 Full  Shoulder adduction    Shoulder internal rotation Deferred til week 6 per protocol Full  Shoulder external rotation 16 Full  Elbow flexion 129 Full  Elbow extension 30 from full ext 0 degrees   UPPER EXTREMITY MMT:    RUE deferred due to NWB status and in sling MMT Right eval Left eval  Shoulder flexion  5/5  Shoulder extension    Shoulder  abduction  5/5  Shoulder adduction    Shoulder internal rotation  5/5  Shoulder external rotation  5/5  Middle trapezius    Lower trapezius  Elbow flexion  5/5  Elbow extension  5/5  Wrist flexion  5/5  Wrist extension  5/5  Wrist ulnar deviation    Wrist radial deviation    Wrist pronation    Wrist supination    Grip strength (lbs) 30.1 lbs (avg 2 trials) 36.5 lbs (avg 2 trials)  (Blank rows = not tested)   PALPATION: TTP along proximal humerus of RUE.     04/02/22:  R shoulder A/PROM in supine: flexion (94 deg.), abduction (92 deg.), ER (33 deg.), IR (78 deg.).  Supine R shoulder PROM flexion 124 deg. (Pain).  Standing R shoulder AROM: flexion (85 deg.), abduction (82 deg.).    04/17/22:  Yellow bulb grasping on R: 2 psi.  Handheld dynamometer: L 56.2#/ R 35.7#.    05/07/22:  Grip strength: L 61#/ R 42# (marked improvement).     TODAY'S TREATMENT (06/23/22)  SUBJECTIVE:   Pt. Has several appts. For diagnostic imaging for low back pain coming up.  Pt. Brought in MD order for PT from Cletis Athens, NP for chronic bilateral low back pain with L sided sciatica.  Pt. States all the testing will determine if she is a surgical candidate or not.       PAIN: R shoulder 0/10 prior to tx. Session.      Ther-ex.:   06/23/22:    There.ex.:  B UBE 2 min. F/b (consistent cadence)- discussed MD order for lumbar spine.   Nautilus (with handles): seated lat. Pull downs 40#/ standing tricep extension 30#/ standing scap. Retraction 40#/ chest press 20#/ shoulder adduction with handles 20# 20x each.    Standing B shoulder A/AROM in front of mirror 10x each (all planes).    Standing RTB scap. Retraction 20x.  Standing bicep curls RTB 20x.         Lumbar screen:   Lumbar rotn. (Tight in supine)/ SLR 60 deg./ pain with bridging).    B LE strength grossly 5/5 MMT except hip flexion/ abduction 4/5 MMT.    PT will do a complete lumbar evaluation next week.      Pt. Will ice at home  (as needed).       PATIENT EDUCATION: Education details: form/technique with exercise and updated HEP Person educated: Patient Education method: Explanation, Demonstration, Tactile cues, Verbal cues, and Handouts Education comprehension: verbalized understanding, returned demonstration, and needs further education   HOME EXERCISE PROGRAM: Access Code: [email or web address]  Access Code: [email or web address] URL: [email or web address] Date: 03/12/2022 Prepared by: Dorcas Carrow   Exercises - Seated Shoulder Flexion AAROM with Pulley Behind  - 2 x daily - 7 x weekly - 2 sets - 10 reps - Seated Shoulder Scaption AAROM with Pulley at Side  - 2 x daily - 7 x weekly - 2 sets - 10 reps - Supine Shoulder Press with Dowel  - 1 x daily - 7 x weekly - 3 sets - 10 reps   Access Code: [email or web address] URL: [email or web address] Date: 05/07/2022 Prepared by: Dorcas Carrow  Exercises - Seated Shoulder Flexion AAROM with Pulley Behind  - 2 x daily - 7 x weekly - 2 sets - 10 reps - Seated Shoulder Scaption AAROM with Pulley at Side  - 2 x daily - 7 x weekly - 2 sets - 10 reps - Standing Shoulder Extension with Dowel  - 1 x daily - 7 x weekly - 2 sets - 10 reps - Standing Bilateral Shoulder Internal Rotation AAROM with Dowel  - 1 x daily - 7 x weekly -  2 sets - 10 reps - Standing Shoulder Flexion AAROM with Dowel  - 1 x daily - 7 x weekly - 2 sets - 10 reps - Supine Shoulder Press with Dowel  - 1 x daily - 7 x weekly - 2 sets - 10 reps - Scapular Retraction with Resistance  - 1 x daily - 5 x weekly - 1 sets - 20 reps - Shoulder extension with resistance - Neutral  - 1 x daily - 5 x weekly - 1 sets - 20 reps - Standing Shoulder Flexion with Resistance  - 1 x daily - 5 x weekly - 1 sets - 20 reps - Standing Single Arm Elbow Flexion with Resistance  - 1 x daily - 5 x weekly - 1 sets - 20 reps   ASSESSMENT:  CLINICAL IMPRESSION: Tx. Focus on R shoulder ROM and standing resisted ex. At [employer].   Pt has limited scapular mobility due to shoulder ROM limitations.  No pain reported during tx. And pt. Motivated to increase shoulder to assist with daily tasks.  Pt. Limited with daily tasks by chronic low back pain and has MD order for PT to eval and tx. Low back.   Pt. will benefit from skilled PT services to continue to address her decreased ROM and strength in RUE in order to optimize strength and function of RUE for return to full independence in all activities.    OBJECTIVE IMPAIRMENTS decreased mobility, decreased ROM, decreased strength, hypomobility, impaired flexibility, improper body mechanics, postural dysfunction, and pain.   ACTIVITY LIMITATIONS carrying, lifting, bathing, toileting, dressing, reach over head, and hygiene/grooming  PARTICIPATION LIMITATIONS: cleaning, driving, shopping, community activity, occupation, and yard work  PERSONAL FACTORS Age, Behavior pattern, Past/current experiences, Time since onset of injury/illness/exacerbation, Transportation, and 3+ comorbidities: HLD, lumbar DDD, OA of cervical spine, scoliosis  are also affecting patient's functional outcome.   REHAB POTENTIAL: Fair History of R sided RTC tears  CLINICAL DECISION MAKING: Evolving/moderate complexity  EVALUATION COMPLEXITY: Moderate   GOALS: Goals reviewed with patient? Yes  SHORT TERM GOALS:   Pt will be independent with HEP to improve R shoulder mobility and strength for ADL completion Baseline: Initiated and given.  9/21: see updated HEP with RTB (good technique) Goal status: Goal met  2.  Pt will improve R shoulder PROM to 90 degrees per protocol in prep for progression of AAROM of R shoulder. Baseline: 74 degrees.  R shoulder 95 deg.  9/21: 98 deg. AROM (pain) Goal status: Goal met   3.  Pt will improve R elbow extension to 0 degrees to prevent elbow contracture for ease of RUE ADL completion.  Baseline: Lacking 30 degrees from terminal extension.  9/21: -2 deg. extension Goal  status: Goal met    LONG TERM GOALS: Target date: 06/25/22 unless otherwise posted  Pt will display full PROM by 8 weeks to demonstrate full joint mobility Baseline: Limited in flexion, ER, Abduction Goal status: Not met Target Date: 06/25/2022  2.  Pt will improve FOTO to target score to demonstrate clinically significant improvement in functional mobility of RUE Baseline: 19 with target score of 55.  9/21: 60 (marked improvement)- limited with overhead reaching/ heavy household chores.   Goal status: Ongoing  3.  Pt will demonstrate at least 3/5 strength in RUE in flexion and abduction for overhead ADL completion Baseline: unable to test currently.  9/21:  R shoulder flexion 3-/5 (pain), bicep 4/5, tricep 4+/5, IR 4/5, ER 3-/5, extension 4+/5 MMT Goal status: Not  met  4.  Pt will demonstrate > 120 degrees of RUE shoulder flexion to demonstrate adequate mobility to complete needed overhead ADL tasks  Baseline: PROM of 74 degrees flexion, 78 PROM abduction.  9/21: 98 deg. AROM in seated posture Goal status:  Not met    PLAN: PT FREQUENCY: 2x/week  PT DURATION: 4 weeks  PLANNED INTERVENTIONS: Therapeutic exercises, Therapeutic activity, Neuromuscular re-education, Patient/Family education, Self Care, Joint mobilization, Dry Needling, Electrical stimulation, Spinal mobilization, Cryotherapy, Moist heat, and Manual therapy  PLAN FOR NEXT SESSION: Progress R shoulder mobility per protocol.  Strengthening focus.   Complete lumbar eval/ issue HEP.     Pura Spice, PT, DPT # [phone] Physical Therapist- [facility]  06/23/2022, 9:05 AM

## 2022-06-24 DIAGNOSIS — G8929 Other chronic pain: Secondary | ICD-10-CM | POA: Diagnosis not present

## 2022-06-24 DIAGNOSIS — M5442 Lumbago with sciatica, left side: Secondary | ICD-10-CM | POA: Diagnosis not present

## 2022-06-24 DIAGNOSIS — M5135 Other intervertebral disc degeneration, thoracolumbar region: Secondary | ICD-10-CM | POA: Diagnosis not present

## 2022-06-24 DIAGNOSIS — M419 Scoliosis, unspecified: Secondary | ICD-10-CM | POA: Diagnosis not present

## 2022-06-25 ENCOUNTER — Ambulatory Visit: Payer: Medicare HMO | Admitting: Physical Therapy

## 2022-06-25 DIAGNOSIS — M6281 Muscle weakness (generalized): Secondary | ICD-10-CM | POA: Diagnosis not present

## 2022-06-25 DIAGNOSIS — S42201D Unspecified fracture of upper end of right humerus, subsequent encounter for fracture with routine healing: Secondary | ICD-10-CM

## 2022-06-25 DIAGNOSIS — M25511 Pain in right shoulder: Secondary | ICD-10-CM | POA: Diagnosis not present

## 2022-06-25 DIAGNOSIS — M5459 Other low back pain: Secondary | ICD-10-CM | POA: Diagnosis not present

## 2022-06-29 ENCOUNTER — Encounter: Payer: Self-pay | Admitting: Physical Therapy

## 2022-06-29 ENCOUNTER — Ambulatory Visit: Payer: Medicare HMO | Admitting: Physical Therapy

## 2022-06-29 DIAGNOSIS — M25511 Pain in right shoulder: Secondary | ICD-10-CM

## 2022-06-29 DIAGNOSIS — M5459 Other low back pain: Secondary | ICD-10-CM | POA: Diagnosis not present

## 2022-06-29 DIAGNOSIS — M6281 Muscle weakness (generalized): Secondary | ICD-10-CM | POA: Diagnosis not present

## 2022-06-29 DIAGNOSIS — S42201D Unspecified fracture of upper end of right humerus, subsequent encounter for fracture with routine healing: Secondary | ICD-10-CM | POA: Diagnosis not present

## 2022-07-02 ENCOUNTER — Ambulatory Visit: Payer: Medicare HMO | Admitting: Physical Therapy

## 2022-07-02 DIAGNOSIS — S42201D Unspecified fracture of upper end of right humerus, subsequent encounter for fracture with routine healing: Secondary | ICD-10-CM

## 2022-07-02 DIAGNOSIS — M6281 Muscle weakness (generalized): Secondary | ICD-10-CM

## 2022-07-02 DIAGNOSIS — M5459 Other low back pain: Secondary | ICD-10-CM | POA: Diagnosis not present

## 2022-07-02 DIAGNOSIS — M25511 Pain in right shoulder: Secondary | ICD-10-CM | POA: Diagnosis not present

## 2022-07-04 NOTE — Therapy (Signed)
OUTPATIENT PHYSICAL THERAPY SHOULDER TREATMENT  Patient Name: Jennifer Morrow MRN: [id number] DOB:10-Mar-1951, 71 y.o., female Today's Date: 06/25/22   PT End of Session - 07/04/22 0656     Visit Number 24    Number of Visits 25    Date for PT Re-Evaluation 06/25/22    PT Start Time 0900    PT Stop Time 0948    PT Time Calculation (min) 48 min    Activity Tolerance Patient tolerated treatment well;Patient limited by pain    Behavior During Therapy [facility] for tasks assessed/performed             Past Medical History:  Diagnosis Date   Arthritis    Bronchitis    CHRONIC   Bulging lumbar disc    degenerative dics   Scoliosis    LIMITS LUNG FUNCTION   Spinal stenosis    Past Surgical History:  Procedure Laterality Date   CATARACT EXTRACTION W/PHACO Left 01/06/2018   Procedure: CATARACT EXTRACTION PHACO AND INTRAOCULAR LENS PLACEMENT ([place]);  Surgeon: Eulogio Bear, MD;  Location: [facility] ORS;  Service: Ophthalmology;  Laterality: Left;  [place] 00:27.5 AP% 4.3 CDE 1.18 FLUID PACK LOT # [id number]   CATARACT EXTRACTION W/PHACO Right 02/10/2018   Procedure: CATARACT EXTRACTION PHACO AND INTRAOCULAR LENS PLACEMENT ([facility]);  Surgeon: Eulogio Bear, MD;  Location: [facility] ORS;  Service: Ophthalmology;  Laterality: Right;  [place] 00:30.5 AP% 5.0 CDE 1.51  Fluid pack lot # [id number]   FOOT X 2     HEEL SPUR EXCISION     TONSILLECTOMY     Patient Active Problem List   Diagnosis Date Noted   Osteoarthritis cervical spine 01/14/2022   Age-related osteoporosis without current pathological fracture 05/19/2021   Spondylosis of lumbar region without myelopathy or radiculopathy 05/19/2021   Degenerative disc disease, lumbar 05/19/2021   Scoliosis, unspecified 01/09/2015   Vitamin D deficiency 01/08/2014   Mixed hyperlipidemia 01/08/2014    PCP: Halina Maidens MD  REFERRING PROVIDER: Renee Harder MD  REFERRING DIAG: Closed fracture of proximal right humerus   THERAPY DIAG:  Acute  pain of right shoulder  Muscle weakness (generalized)  Closed fracture of proximal end of right humerus with routine healing, unspecified fracture morphology, subsequent encounter  Other low back pain  Rationale for Evaluation and Treatment Rehabilitation  ONSET DATE: 01/30/22  SUBJECTIVE:                                                                                                                                                                                      SUBJECTIVE STATEMENT:  EVALUATION Pt is s/p R proximal humerus fracture on 01/30/22 for conservative management. Pt  has been doing her HEP, and is able to roll briefly onto her L shoulder while sleeping. 0/10 pain prior to tx. Pt has pain while dressing herself, and is not able to fully independently don/doff a shirt yet. Pt stated that she has pain in her R low back, with numbness down her LLE and L low back but is chronic pain she has dealt with for years. Pt currently sleeps in adjustable bed, with the ability to raise the head/feet of the bed. Has been compliant with HEP reporting shoulder is moving better with less pain with mobility.   PERTINENT HISTORY: Pt presenting with R shoulder pain s/p non operative proximal humerus fracture. Pt reports having a tent fall on her leading her to fall to the ground on her R side. Pt here with non-operative PT protocol. Reports orthopedic MD planning for 6-8 weeks of donning sling. Pt has history of R sided RTC tears and orthopedic MD stating return to full AROM in RUE may not be feasible. No MRI for RTC tissue quality as pt reports MD wants to see how pt manages with AROM with fracture recovery. Pt is R handed. Currently receiving HH aide for 4 hours in the morning to assist ADL's primarily dressing. No longer requiring pain meds except Tylenol PRN. Pain vastly has improved, worst pain nowadays is a 4/10 NPS sporadically. Has been working on elbow extension in recliner, some numbness in hand at  night palmar and dorsal. Rated as moderate, unsure of time of numbness.   PAIN:  Are you having pain? Yes: NPRS scale: 0/10 Pain location: R shoulder Pain description: Dull/achey Aggravating factors: RUE movement Relieving factors: Rest  PRECAUTIONS: See protocol in chart  WEIGHT BEARING RESTRICTIONS Yes WBAT on RUE  FALLS:  Has patient fallen in last 6 months? No  LIVING ENVIRONMENT: Lives with: lives with their family and lives alone Lives in: House/apartment Stairs: Yes: External: 3 steps; on right going up Has following equipment at home: None  OCCUPATION: Retired  PLOF: Independent  PATIENT GOALS Improve RUE motion/strength. Return to full independence.    OBJECTIVE:   DIAGNOSTIC FINDINGS:  XR Shoulder 3 Or More Views Right  Final Result   Acute, comminuted and anteriorly displaced fracture of the right proximal humerus with involvement of the surgical and anatomic necks and fragmentation of the greater tuberosity. Large fracture fragment containing the majority of the glenoid articular surface is rotated laterally.   PATIENT SURVEYS:  FOTO 19 with target of 55  COGNITION: Overall cognitive status: Within functional limits for tasks assessed     SENSATION: WFL  POSTURE: Upper trap activation most likely due to wearing sling. Overall WNL.    CERVICAL AROM:  Flexion: 50 degrees Extension: 45 degrees Rotation R/L: 50/45 degrees Lateral flexion (R/L): 30/35 degrees   UPPER EXTREMITY ROM:   Active ROM Right eval Left eval  Shoulder flexion PROM 74 Full  Shoulder extension    Shoulder abduction PROM 78 Full  Shoulder adduction    Shoulder internal rotation Deferred til week 6 per protocol Full  Shoulder external rotation 16 Full  Elbow flexion 129 Full  Elbow extension 30 from full ext 0 degrees   UPPER EXTREMITY MMT:    RUE deferred due to NWB status and in sling MMT Right eval Left eval  Shoulder flexion  5/5  Shoulder extension     Shoulder abduction  5/5  Shoulder adduction    Shoulder internal rotation  5/5  Shoulder external rotation  5/5    Middle trapezius    Lower trapezius    Elbow flexion  5/5  Elbow extension  5/5  Wrist flexion  5/5  Wrist extension  5/5  Wrist ulnar deviation    Wrist radial deviation    Wrist pronation    Wrist supination    Grip strength (lbs) 30.1 lbs (avg 2 trials) 36.5 lbs (avg 2 trials)  (Blank rows = not tested)   PALPATION: TTP along proximal humerus of RUE.     04/02/22:  R shoulder A/PROM in supine: flexion (94 deg.), abduction (92 deg.), ER (33 deg.), IR (78 deg.).  Supine R shoulder PROM flexion 124 deg. (Pain).  Standing R shoulder AROM: flexion (85 deg.), abduction (82 deg.).    04/17/22:  Yellow bulb grasping on R: 2 psi.  Handheld dynamometer: L 56.2#/ R 35.7#.    05/07/22:  Grip strength: L 61#/ R 42# (marked improvement).     TODAY'S TREATMENT (06/25/22)  SUBJECTIVE:   Pt. Discussed MD report (see below). No new complaints.  Primary PT focus remains R shoulder ROM/ strength/ pain mgmt.     Findings:  Planar Imaging: Thoracolumbar scoliosis, convex to the right at T8-T9 and to the left at L2. Multifocal and multilevel increased uptake throughout the thoracolumbar spine centered approximately at T9-T10 and most prominent on the left. Focal uptake within the right proximal humerus.  SPECT imaging: Multifocal uptake seen throughout multiple levels of the thoracic and lumbar spine. Greatest uptake is seen in the left facet joint of T9-T10 and left facet joint of L4-L5. Additionally, significant uptake is seen at the right interdisc space of L3-L4 and the left disc space of L4-L5.  Focal uptake is seen within the right humeral head, corresponding with deformity on CT images; CT technique reduces sensitivity for detection of nondisplaced fracture.  Impression: 1.  Degenerative disc disease and facet arthropathy of the thoracolumbar spine as described  above. 2.  Intense uptake in the proximal humerus corresponding to focal deformity of the right humoral head. If there is concern for acute fracture, recommend dedicated radiographs for correlation.  Electronically Reviewed by:  Elonda Husky, MD, [facility] Electronically Reviewed on:  06/24/2022 4:32 PM      PAIN: R shoulder 0/10 prior to tx. Session.  5/10 LBP   Ther-ex.:   06/25/22:    There.ex.:  Nustep L4 10 min. B UE/LE. Discussed imaging results.   Standing wt. Wand B shoulder AAROM (flexion/ extension/ abduction/ chest press/ IR)- 20x with mirror feedback.    Nautilus (with handles): seated lat. Pull downs 50#/ standing tricep extension 30#/ standing scap. Retraction 40#/ chest press 20#/ shoulder adduction with handles 20# 20x each.    Reassessment of R shoulder AROM (PT assist for OP with flexion/ abduction)- pain limited.   Discussed HEP       Lumbar screen:   Lumbar rotn. (Tight in supine)/ SLR 60 deg./ pain with bridging).    B LE strength grossly 5/5 MMT except hip flexion/ abduction 4/5 MMT.    PT will do a complete lumbar evaluation next week during [facility]     Pt. Will ice at home (as needed).       PATIENT EDUCATION: Education details: form/technique with exercise and updated HEP Person educated: Patient Education method: Explanation, Demonstration, Tactile cues, Verbal cues, and Handouts Education comprehension: verbalized understanding, returned demonstration, and needs further education   HOME EXERCISE PROGRAM: Access Code: [email or web address]  Access Code: [email or web address] URL: [email or web address] Date: 03/12/2022 Prepared by: Dorcas Carrow  Exercises - Seated Shoulder Flexion AAROM with Pulley Behind  - 2 x daily - 7 x weekly - 2 sets - 10 reps - Seated Shoulder Scaption AAROM with Pulley at Side  - 2 x daily - 7 x weekly - 2 sets - 10 reps - Supine Shoulder Press with Dowel  - 1 x daily - 7 x weekly - 3 sets - 10 reps   Access  Code: [email or web address] URL: [email or web address] Date: 05/07/2022 Prepared by: Dorcas Carrow  Exercises - Seated Shoulder Flexion AAROM with Pulley Behind  - 2 x daily - 7 x weekly - 2 sets - 10 reps - Seated Shoulder Scaption AAROM with Pulley at Side  - 2 x daily - 7 x weekly - 2 sets - 10 reps - Standing Shoulder Extension with Dowel  - 1 x daily - 7 x weekly - 2 sets - 10 reps - Standing Bilateral Shoulder Internal Rotation AAROM with Dowel  - 1 x daily - 7 x weekly - 2 sets - 10 reps - Standing Shoulder Flexion AAROM with Dowel  - 1 x daily - 7 x weekly - 2 sets - 10 reps - Supine Shoulder Press with Dowel  - 1 x daily - 7 x weekly - 2 sets - 10 reps - Scapular Retraction with Resistance  - 1 x daily - 5 x weekly - 1 sets - 20 reps - Shoulder extension with resistance - Neutral  - 1 x daily - 5 x weekly - 1 sets - 20 reps - Standing Shoulder Flexion with Resistance  - 1 x daily - 5 x weekly - 1 sets - 20 reps - Standing Single Arm Elbow Flexion with Resistance  - 1 x daily - 5 x weekly - 1 sets - 20 reps   ASSESSMENT:  CLINICAL IMPRESSION: Tx. Focus on R shoulder ROM and standing resisted ex. At [employer].  Pt has limited scapular mobility due to shoulder ROM limitations.  No pain reported during tx. And pt. Motivated to increase shoulder to assist with daily tasks.  Pt. Limited with daily tasks by chronic low back pain and has MD order for PT to eval and tx. Low back.   Pt. will benefit from skilled PT services to continue to address her decreased ROM and strength in RUE in order to optimize strength and function of RUE for return to full independence in all activities.    OBJECTIVE IMPAIRMENTS decreased mobility, decreased ROM, decreased strength, hypomobility, impaired flexibility, improper body mechanics, postural dysfunction, and pain.   ACTIVITY LIMITATIONS carrying, lifting, bathing, toileting, dressing, reach over head, and hygiene/grooming  PARTICIPATION LIMITATIONS:  cleaning, driving, shopping, community activity, occupation, and yard work  PERSONAL FACTORS Age, Behavior pattern, Past/current experiences, Time since onset of injury/illness/exacerbation, Transportation, and 3+ comorbidities: HLD, lumbar DDD, OA of cervical spine, scoliosis  are also affecting patient's functional outcome.   REHAB POTENTIAL: Fair History of R sided RTC tears  CLINICAL DECISION MAKING: Evolving/moderate complexity  EVALUATION COMPLEXITY: Moderate   GOALS: Goals reviewed with patient? Yes  SHORT TERM GOALS:   Pt will be independent with HEP to improve R shoulder mobility and strength for ADL completion Baseline: Initiated and given.  9/21: see updated HEP with RTB (good technique) Goal status: Goal met  2.  Pt will improve R shoulder PROM to 90 degrees per protocol in prep for progression of AAROM of R shoulder. Baseline: 74 degrees.  R shoulder 95 deg.  9/21: 98 deg. AROM (pain) Goal status: Goal  met   3.  Pt will improve R elbow extension to 0 degrees to prevent elbow contracture for ease of RUE ADL completion.  Baseline: Lacking 30 degrees from terminal extension.  9/21: -2 deg. extension Goal status: Goal met    LONG TERM GOALS: Target date: 06/25/22 unless otherwise posted  Pt will display full PROM by 8 weeks to demonstrate full joint mobility Baseline: Limited in flexion, ER, Abduction Goal status: Not met Target Date: 06/25/2022  2.  Pt will improve FOTO to target score to demonstrate clinically significant improvement in functional mobility of RUE Baseline: 19 with target score of 55.  9/21: 60 (marked improvement)- limited with overhead reaching/ heavy household chores.   Goal status: Partially met  3.  Pt will demonstrate at least 3/5 strength in RUE in flexion and abduction for overhead ADL completion Baseline: unable to test currently.  9/21:  R shoulder flexion 3-/5 (pain), bicep 4/5, tricep 4+/5, IR 4/5, ER 3-/5, extension 4+/5 MMT Goal  status: Not met  4.  Pt will demonstrate > 120 degrees of RUE shoulder flexion to demonstrate adequate mobility to complete needed overhead ADL tasks  Baseline: PROM of 74 degrees flexion, 78 PROM abduction.  9/21: 98 deg. AROM in seated posture Goal status:  Not met    PLAN: PT FREQUENCY: 2x/week  PT DURATION: 4 weeks  PLANNED INTERVENTIONS: Therapeutic exercises, Therapeutic activity, Neuromuscular re-education, Patient/Family education, Self Care, Joint mobilization, Dry Needling, Electrical stimulation, Spinal mobilization, Cryotherapy, Moist heat, and Manual therapy  PLAN FOR NEXT SESSION: Progress R shoulder mobility per protocol.  Strengthening focus.   Complete lumbar eval/ issue HEP.     Pura Spice, PT, DPT # [phone] Physical Therapist- [facility]  07/04/2022, 6:57 AM

## 2022-07-04 NOTE — Therapy (Signed)
OUTPATIENT PHYSICAL THERAPY SHOULDER TREATMENT  Patient Name: Jennifer Morrow MRN: [id number] DOB:June 10, 1951, 71 y.o., female Today's Date: 07/02/22   PT End of Session - 07/04/22 1719     Visit Number 26    Number of Visits 41    Date for PT Re-Evaluation 08/24/22    PT Start Time 0859    PT Stop Time 0946    PT Time Calculation (min) 47 min    Activity Tolerance Patient tolerated treatment well;Patient limited by pain    Behavior During Therapy [facility] for tasks assessed/performed             Past Medical History:  Diagnosis Date   Arthritis    Bronchitis    CHRONIC   Bulging lumbar disc    degenerative dics   Scoliosis    LIMITS LUNG FUNCTION   Spinal stenosis    Past Surgical History:  Procedure Laterality Date   CATARACT EXTRACTION W/PHACO Left 01/06/2018   Procedure: CATARACT EXTRACTION PHACO AND INTRAOCULAR LENS PLACEMENT ([place]);  Surgeon: Eulogio Bear, MD;  Location: [facility] ORS;  Service: Ophthalmology;  Laterality: Left;  [place] 00:27.5 AP% 4.3 CDE 1.18 FLUID PACK LOT # [id number]   CATARACT EXTRACTION W/PHACO Right 02/10/2018   Procedure: CATARACT EXTRACTION PHACO AND INTRAOCULAR LENS PLACEMENT ([facility]);  Surgeon: Eulogio Bear, MD;  Location: [facility] ORS;  Service: Ophthalmology;  Laterality: Right;  [place] 00:30.5 AP% 5.0 CDE 1.51  Fluid pack lot # [id number]   FOOT X 2     HEEL SPUR EXCISION     TONSILLECTOMY     Patient Active Problem List   Diagnosis Date Noted   Osteoarthritis cervical spine 01/14/2022   Age-related osteoporosis without current pathological fracture 05/19/2021   Spondylosis of lumbar region without myelopathy or radiculopathy 05/19/2021   Degenerative disc disease, lumbar 05/19/2021   Scoliosis, unspecified 01/09/2015   Vitamin D deficiency 01/08/2014   Mixed hyperlipidemia 01/08/2014    PCP: Halina Maidens MD  REFERRING PROVIDER: Renee Harder MD  REFERRING DIAG: Closed fracture of proximal right humerus   THERAPY DIAG:  Acute  pain of right shoulder  Muscle weakness (generalized)  Closed fracture of proximal end of right humerus with routine healing, unspecified fracture morphology, subsequent encounter  Other low back pain  Rationale for Evaluation and Treatment Rehabilitation  ONSET DATE: 01/30/22  SUBJECTIVE:                                                                                                                                                                                      SUBJECTIVE STATEMENT:  EVALUATION Pt is s/p R proximal humerus fracture on 01/30/22 for conservative management. Pt  has been doing her HEP, and is able to roll briefly onto her L shoulder while sleeping. 0/10 pain prior to tx. Pt has pain while dressing herself, and is not able to fully independently don/doff a shirt yet. Pt stated that she has pain in her R low back, with numbness down her LLE and L low back but is chronic pain she has dealt with for years. Pt currently sleeps in adjustable bed, with the ability to raise the head/feet of the bed. Has been compliant with HEP reporting shoulder is moving better with less pain with mobility.   PERTINENT HISTORY: Pt presenting with R shoulder pain s/p non operative proximal humerus fracture. Pt reports having a tent fall on her leading her to fall to the ground on her R side. Pt here with non-operative PT protocol. Reports orthopedic MD planning for 6-8 weeks of donning sling. Pt has history of R sided RTC tears and orthopedic MD stating return to full AROM in RUE may not be feasible. No MRI for RTC tissue quality as pt reports MD wants to see how pt manages with AROM with fracture recovery. Pt is R handed. Currently receiving HH aide for 4 hours in the morning to assist ADL's primarily dressing. No longer requiring pain meds except Tylenol PRN. Pain vastly has improved, worst pain nowadays is a 4/10 NPS sporadically. Has been working on elbow extension in recliner, some numbness in hand at  night palmar and dorsal. Rated as moderate, unsure of time of numbness.   PAIN:  Are you having pain? Yes: NPRS scale: 0/10 Pain location: R shoulder Pain description: Dull/achey Aggravating factors: RUE movement Relieving factors: Rest  PRECAUTIONS: See protocol in chart  WEIGHT BEARING RESTRICTIONS Yes WBAT on RUE  FALLS:  Has patient fallen in last 6 months? No  LIVING ENVIRONMENT: Lives with: lives with their family and lives alone Lives in: House/apartment Stairs: Yes: External: 3 steps; on right going up Has following equipment at home: None  OCCUPATION: Retired  PLOF: Independent  PATIENT GOALS Improve RUE motion/strength. Return to full independence.    OBJECTIVE:   DIAGNOSTIC FINDINGS:  XR Shoulder 3 Or More Views Right  Final Result   Acute, comminuted and anteriorly displaced fracture of the right proximal humerus with involvement of the surgical and anatomic necks and fragmentation of the greater tuberosity. Large fracture fragment containing the majority of the glenoid articular surface is rotated laterally.   PATIENT SURVEYS:  FOTO 19 with target of 55  COGNITION: Overall cognitive status: Within functional limits for tasks assessed     SENSATION: WFL  POSTURE: Upper trap activation most likely due to wearing sling. Overall WNL.    CERVICAL AROM:  Flexion: 50 degrees Extension: 45 degrees Rotation R/L: 50/45 degrees Lateral flexion (R/L): 30/35 degrees   UPPER EXTREMITY ROM:   Active ROM Right eval Left eval  Shoulder flexion PROM 74 Full  Shoulder extension    Shoulder abduction PROM 78 Full  Shoulder adduction    Shoulder internal rotation Deferred til week 6 per protocol Full  Shoulder external rotation 16 Full  Elbow flexion 129 Full  Elbow extension 30 from full ext 0 degrees   UPPER EXTREMITY MMT:    RUE deferred due to NWB status and in sling MMT Right eval Left eval  Shoulder flexion  5/5  Shoulder extension     Shoulder abduction  5/5  Shoulder adduction    Shoulder internal rotation  5/5  Shoulder external rotation  5/5    Middle trapezius    Lower trapezius    Elbow flexion  5/5  Elbow extension  5/5  Wrist flexion  5/5  Wrist extension  5/5  Wrist ulnar deviation    Wrist radial deviation    Wrist pronation    Wrist supination    Grip strength (lbs) 30.1 lbs (avg 2 trials) 36.5 lbs (avg 2 trials)  (Blank rows = not tested)   PALPATION: TTP along proximal humerus of RUE.     04/02/22:  R shoulder A/PROM in supine: flexion (94 deg.), abduction (92 deg.), ER (33 deg.), IR (78 deg.).  Supine R shoulder PROM flexion 124 deg. (Pain).  Standing R shoulder AROM: flexion (85 deg.), abduction (82 deg.).    04/17/22:  Yellow bulb grasping on R: 2 psi.  Handheld dynamometer: L 56.2#/ R 35.7#.    05/07/22:  Grip strength: L 61#/ R 42# (marked improvement).   Lumbar screen:   Lumbar rotn. (Tight in supine)/ SLR 60 deg./ pain with bridging).    B LE strength grossly 5/5 MMT except hip flexion/ abduction 4/5 MMT.     TODAY'S TREATMENT (07/02/22)  SUBJECTIVE:   Pt. Reports she has been busy and doesn't have her endurance back.  Pt. Has to plan out her tasks due to fatigue/ R shoulder and low back pain.     PAIN: R shoulder 0/10 prior to tx. Session.  Pt. Reports 1/10 LBP in morning and >5/10 in afternoon.    Ther-ex.:   07/02/22:    There.ex.:  Nustep L4 10 min. B UE/LE.  Discussed upcoming events with friends.    Nautilus (with handles): seated lat. Pull downs 50#/ standing tricep extension 30#/ standing scap. Retraction 40#/ chest press 20#/ shoulder adduction with handles 20# 20x each.   Standing ER with 10# 10x each (difficulty).    3# B shoulder shrugs/ bicep curls 20x.    See lumbar stab. Ex. Program (Access Code: [email or web address])- attempted bridging but pain limited.  Improved pain with use of red bolster 10x.   Supine R shoulder AA/PROM (all planes)- with holds as tolerated.    Supine  hip/lumbar generalized stretches (pain tolerable range)- benefits from gentle rotn. To L/R.      Pt. Will ice at home (as needed).       PATIENT EDUCATION: Education details: form/technique with exercise and updated HEP.  Access Code: [email or web address] Person educated: Patient Education method: Explanation, Demonstration, Tactile cues, Verbal cues, and Handouts Education comprehension: verbalized understanding, returned demonstration, and needs further education   HOME EXERCISE PROGRAM: Access Code: [email or web address]  Access Code: [email or web address] URL: [email or web address] Date: 03/12/2022 Prepared by: Dorcas Carrow   Exercises - Seated Shoulder Flexion AAROM with Pulley Behind  - 2 x daily - 7 x weekly - 2 sets - 10 reps - Seated Shoulder Scaption AAROM with Pulley at Side  - 2 x daily - 7 x weekly - 2 sets - 10 reps - Supine Shoulder Press with Dowel  - 1 x daily - 7 x weekly - 3 sets - 10 reps   Access Code: [email or web address] URL: [email or web address] Date: 05/07/2022 Prepared by: Dorcas Carrow  Exercises - Seated Shoulder Flexion AAROM with Pulley Behind  - 2 x daily - 7 x weekly - 2 sets - 10 reps - Seated Shoulder Scaption AAROM with Pulley at Side  - 2 x daily - 7 x weekly - 2 sets - 10 reps - Standing Shoulder Extension with Dowel  - 1 x daily - 7 x weekly -  2 sets - 10 reps - Standing Bilateral Shoulder Internal Rotation AAROM with Dowel  - 1 x daily - 7 x weekly - 2 sets - 10 reps - Standing Shoulder Flexion AAROM with Dowel  - 1 x daily - 7 x weekly - 2 sets - 10 reps - Supine Shoulder Press with Dowel  - 1 x daily - 7 x weekly - 2 sets - 10 reps - Scapular Retraction with Resistance  - 1 x daily - 5 x weekly - 1 sets - 20 reps - Shoulder extension with resistance - Neutral  - 1 x daily - 5 x weekly - 1 sets - 20 reps - Standing Shoulder Flexion with Resistance  - 1 x daily - 5 x weekly - 1 sets - 20 reps - Standing Single Arm Elbow Flexion with Resistance  - 1 x daily  - 5 x weekly - 1 sets - 20 reps   Access Code: [email or web address] URL: [email or web address] Date: 07/04/2022 Prepared by: Dorcas Carrow  Exercises - Hooklying Transversus Abdominis Palpation  - 1 x daily - 7 x weekly - 2 sets - 10 reps - 3 hold - Supine Posterior Pelvic Tilt  - 1 x daily - 7 x weekly - 2 sets - 10 reps - 3 hold - Supine Transversus Abdominis Bracing with Heel Slide  - 1 x daily - 7 x weekly - 2 sets - 10 reps - Supine March  - 1 x daily - 7 x weekly - 2 sets - 10 reps - Supine Lower Trunk Rotation  - 1 x daily - 7 x weekly - 1 sets - 3 reps - 2 hold - Hooklying Single Knee to Chest Stretch  - 1 x daily - 7 x weekly - 1 sets - 3 reps   ASSESSMENT:  CLINICAL IMPRESSION:  Pt. Remains limited with daily activity/ functional mobility due to chronic back pain.  PT issued TrA ex. With progression for HEP.   Pt. Continues to benefit from UE strengthening/ shoulder stretches to improve functional mobility.  Pt. will benefit from skilled PT services to continue to address her decreased ROM and strength in RUE in order to optimize strength and function of RUE for return to full independence in all activities.  Pt. Will benefit from core stability ex. Program to improve daily activity/ pain-free mobility.     OBJECTIVE IMPAIRMENTS decreased mobility, decreased ROM, decreased strength, hypomobility, impaired flexibility, improper body mechanics, postural dysfunction, and pain.   ACTIVITY LIMITATIONS carrying, lifting, bathing, toileting, dressing, reach over head, and hygiene/grooming  PARTICIPATION LIMITATIONS: cleaning, driving, shopping, community activity, occupation, and yard work  PERSONAL FACTORS Age, Behavior pattern, Past/current experiences, Time since onset of injury/illness/exacerbation, Transportation, and 3+ comorbidities: HLD, lumbar DDD, OA of cervical spine, scoliosis  are also affecting patient's functional outcome.   REHAB POTENTIAL: Fair History of R sided  RTC tears  CLINICAL DECISION MAKING: Evolving/moderate complexity  EVALUATION COMPLEXITY: Moderate   GOALS: Goals reviewed with patient? Yes  SHORT TERM GOALS:  07/27/22  Pt will be independent with HEP to improve R shoulder mobility and strength for ADL completion Baseline: Initiated and given.  9/21: see updated HEP with RTB (good technique) Goal status: Goal met  2.  Pt will improve R shoulder PROM to 90 degrees per protocol in prep for progression of AAROM of R shoulder. Baseline: 74 degrees.  R shoulder 95 deg.  9/21: 98 deg. AROM (pain) Goal status: Goal met   3.  Pt will improve R  elbow extension to 0 degrees to prevent elbow contracture for ease of RUE ADL completion.  Baseline: Lacking 30 degrees from terminal extension.  9/21: -2 deg. extension Goal status: Goal met    4.  Pt. Will be independent with core stability ex. Program to improve standing tolerance/ completion of daily household tasks.      Baseline:  pt. Currently not participating with core ex.    Goal status: Initial  LONG TERM GOALS: Target date: 08/24/22  Pt will display full AROM as compared to L shoulder to demonstrate full joint mobility/ overhead reaching.  Baseline: Limited in flexion, ER, Abduction Goal status: Not met Target Date: 08/24/2022  2.  Pt will improve FOTO to target score to demonstrate clinically significant improvement in functional mobility of RUE Baseline: 19 with target score of 55.  9/21: 60 (marked improvement)- limited with overhead reaching/ heavy household chores.   Goal status: Partially met  3.  Pt will demonstrate at least 3/5 strength in RUE in flexion and abduction for overhead ADL completion Baseline: unable to test currently.  9/21:  R shoulder flexion 3-/5 (pain), bicep 4/5, tricep 4+/5, IR 4/5, ER 3-/5, extension 4+/5 MMT Goal status: Not met  4.  Pt will demonstrate > 120 degrees of RUE shoulder flexion to demonstrate adequate mobility to complete needed overhead  ADL tasks  Baseline: PROM of 74 degrees flexion, 78 PROM abduction.  9/21: 98 deg. AROM in seated posture Goal status:  Not met    PLAN: PT FREQUENCY: 2x/week  PT DURATION: 8 weeks  PLANNED INTERVENTIONS: Therapeutic exercises, Therapeutic activity, Neuromuscular re-education, Patient/Family education, Self Care, Joint mobilization, Dry Needling, Electrical stimulation, Spinal mobilization, Cryotherapy, Moist heat, and Manual therapy  PLAN FOR NEXT SESSION: Progress R shoulder mobility per protocol.  Strengthening focus.   Discuss core ex.   Pura Spice, PT, DPT # [phone] Physical Therapist- [facility]  07/04/2022, 5:20 PM

## 2022-07-04 NOTE — Therapy (Signed)
OUTPATIENT PHYSICAL THERAPY SHOULDER TREATMENT/ RECERTIFICATION  Patient Name: Jennifer Morrow MRN: [id number] DOB:July 12, 1951, 71 y.o., female Today's Date: 06/29/22   PT End of Session - 07/04/22 1610     Visit Number 25    Number of Visits 41    Date for PT Re-Evaluation 08/24/22    PT Start Time 0943    PT Stop Time 1031    PT Time Calculation (min) 48 min    Activity Tolerance Patient tolerated treatment well;Patient limited by pain    Behavior During Therapy [facility] for tasks assessed/performed             Past Medical History:  Diagnosis Date   Arthritis    Bronchitis    CHRONIC   Bulging lumbar disc    degenerative dics   Scoliosis    LIMITS LUNG FUNCTION   Spinal stenosis    Past Surgical History:  Procedure Laterality Date   CATARACT EXTRACTION W/PHACO Left 01/06/2018   Procedure: CATARACT EXTRACTION PHACO AND INTRAOCULAR LENS PLACEMENT ([place]);  Surgeon: Eulogio Bear, MD;  Location: [facility] ORS;  Service: Ophthalmology;  Laterality: Left;  [place] 00:27.5 AP% 4.3 CDE 1.18 FLUID PACK LOT # [id number]   CATARACT EXTRACTION W/PHACO Right 02/10/2018   Procedure: CATARACT EXTRACTION PHACO AND INTRAOCULAR LENS PLACEMENT ([facility]);  Surgeon: Eulogio Bear, MD;  Location: [facility] ORS;  Service: Ophthalmology;  Laterality: Right;  [place] 00:30.5 AP% 5.0 CDE 1.51  Fluid pack lot # [id number]   FOOT X 2     HEEL SPUR EXCISION     TONSILLECTOMY     Patient Active Problem List   Diagnosis Date Noted   Osteoarthritis cervical spine 01/14/2022   Age-related osteoporosis without current pathological fracture 05/19/2021   Spondylosis of lumbar region without myelopathy or radiculopathy 05/19/2021   Degenerative disc disease, lumbar 05/19/2021   Scoliosis, unspecified 01/09/2015   Vitamin D deficiency 01/08/2014   Mixed hyperlipidemia 01/08/2014    PCP: Halina Maidens MD  REFERRING PROVIDER: Renee Harder MD  REFERRING DIAG: Closed fracture of proximal right humerus    THERAPY DIAG:  Acute pain of right shoulder  Muscle weakness (generalized)  Closed fracture of proximal end of right humerus with routine healing, unspecified fracture morphology, subsequent encounter  Other low back pain  Rationale for Evaluation and Treatment Rehabilitation  ONSET DATE: 01/30/22  SUBJECTIVE:                                                                                                                                                                                      SUBJECTIVE STATEMENT:  EVALUATION Pt is s/p R proximal humerus fracture on 01/30/22 for conservative management.  Pt has been doing her HEP, and is able to roll briefly onto her L shoulder while sleeping. 0/10 pain prior to tx. Pt has pain while dressing herself, and is not able to fully independently don/doff a shirt yet. Pt stated that she has pain in her R low back, with numbness down her LLE and L low back but is chronic pain she has dealt with for years. Pt currently sleeps in adjustable bed, with the ability to raise the head/feet of the bed. Has been compliant with HEP reporting shoulder is moving better with less pain with mobility.   PERTINENT HISTORY: Pt presenting with R shoulder pain s/p non operative proximal humerus fracture. Pt reports having a tent fall on her leading her to fall to the ground on her R side. Pt here with non-operative PT protocol. Reports orthopedic MD planning for 6-8 weeks of donning sling. Pt has history of R sided RTC tears and orthopedic MD stating return to full AROM in RUE may not be feasible. No MRI for RTC tissue quality as pt reports MD wants to see how pt manages with AROM with fracture recovery. Pt is R handed. Currently receiving HH aide for 4 hours in the morning to assist ADL's primarily dressing. No longer requiring pain meds except Tylenol PRN. Pain vastly has improved, worst pain nowadays is a 4/10 NPS sporadically. Has been working on elbow extension in  recliner, some numbness in hand at night palmar and dorsal. Rated as moderate, unsure of time of numbness.   PAIN:  Are you having pain? Yes: NPRS scale: 0/10 Pain location: R shoulder Pain description: Dull/achey Aggravating factors: RUE movement Relieving factors: Rest  PRECAUTIONS: See protocol in chart  WEIGHT BEARING RESTRICTIONS Yes WBAT on RUE  FALLS:  Has patient fallen in last 6 months? No  LIVING ENVIRONMENT: Lives with: lives with their family and lives alone Lives in: House/apartment Stairs: Yes: External: 3 steps; on right going up Has following equipment at home: None  OCCUPATION: Retired  PLOF: [employer] motion/strength. Return to full independence.    OBJECTIVE:   DIAGNOSTIC FINDINGS:  XR Shoulder 3 Or More Views Right  Final Result   Acute, comminuted and anteriorly displaced fracture of the right proximal humerus with involvement of the surgical and anatomic necks and fragmentation of the greater tuberosity. Large fracture fragment containing the majority of the glenoid articular surface is rotated laterally.   PATIENT SURVEYS:  FOTO 19 with target of 73  COGNITION: Overall cognitive status: Within functional limits for tasks assessed     SENSATION: WFL  POSTURE: Upper trap activation most likely due to wearing sling. Overall WNL.    CERVICAL AROM:  Flexion: 50 degrees Extension: 45 degrees Rotation R/L: 50/45 degrees Lateral flexion (R/L): 30/35 degrees   UPPER EXTREMITY ROM:   Active ROM Right eval Left eval  Shoulder flexion PROM 74 Full  Shoulder extension    Shoulder abduction PROM 78 Full  Shoulder adduction    Shoulder internal rotation Deferred til week 6 per protocol Full  Shoulder external rotation 16 Full  Elbow flexion 129 Full  Elbow extension 30 from full ext 0 degrees   UPPER EXTREMITY MMT:    RUE deferred due to NWB status and in sling MMT Right eval Left eval  Shoulder flexion   5/5  Shoulder extension    Shoulder abduction  5/5  Shoulder adduction    Shoulder internal rotation  5/5  Shoulder external rotation  5/5  Middle trapezius    Lower trapezius    Elbow flexion  5/5  Elbow extension  5/5  Wrist flexion  5/5  Wrist extension  5/5  Wrist ulnar deviation    Wrist radial deviation    Wrist pronation    Wrist supination    Grip strength (lbs) 30.1 lbs (avg 2 trials) 36.5 lbs (avg 2 trials)  (Blank rows = not tested)   PALPATION: TTP along proximal humerus of RUE.     04/02/22:  R shoulder A/PROM in supine: flexion (94 deg.), abduction (92 deg.), ER (33 deg.), IR (78 deg.).  Supine R shoulder PROM flexion 124 deg. (Pain).  Standing R shoulder AROM: flexion (85 deg.), abduction (82 deg.).    04/17/22:  Yellow bulb grasping on R: 2 psi.  Handheld dynamometer: L 56.2#/ R 35.7#.    05/07/22:  Grip strength: L 61#/ R 42# (marked improvement).   Lumbar screen:   Lumbar rotn. (Tight in supine)/ SLR 60 deg./ pain with bridging).    B LE strength grossly 5/5 MMT except hip flexion/ abduction 4/5 MMT.     TODAY'S TREATMENT (06/29/22)  SUBJECTIVE:   Pt. Reports difficulty sleeping at night secondary to increase R shoulder discomfort.  Pt. Benefits from ice to R shoulder to manage pain.  Pt. Reports no R shoulder pain currently at rest but 5/10 low back pain.        PAIN: R shoulder 0/10 prior to tx. Session.  5/10 LBP  Ther-ex.:   06/29/22:    There.ex.:  Nustep L4 10 min. B UE/LE. Discussed imaging results.   Nautilus (with handles): seated lat. Pull downs 50#/ standing tricep extension 30#/ standing scap. Retraction 40#/ chest press 20#/ shoulder adduction with handles 20# 20x each.   Standing ER with 10# 10x each (difficulty).    3# B shoulder shrugs/ bicep curls 20x.    Supine R shoulder AA/PROM (all planes)- with holds as tolerated.    Supine hip/lumbar generalized stretches (pain tolerable range)- benefits from gentle rotn. To L/R.       Pt. Will ice at home (as needed).       PATIENT EDUCATION: Education details: form/technique with exercise and updated HEP Person educated: Patient Education method: Explanation, Demonstration, Tactile cues, Verbal cues, and Handouts Education comprehension: verbalized understanding, returned demonstration, and needs further education   HOME EXERCISE PROGRAM: Access Code: [email or web address]  Access Code: [email or web address] URL: [email or web address] Date: 03/12/2022 Prepared by: Dorcas Carrow   Exercises - Seated Shoulder Flexion AAROM with Pulley Behind  - 2 x daily - 7 x weekly - 2 sets - 10 reps - Seated Shoulder Scaption AAROM with Pulley at Side  - 2 x daily - 7 x weekly - 2 sets - 10 reps - Supine Shoulder Press with Dowel  - 1 x daily - 7 x weekly - 3 sets - 10 reps   Access Code: [email or web address] URL: [email or web address] Date: 05/07/2022 Prepared by: Dorcas Carrow  Exercises - Seated Shoulder Flexion AAROM with Pulley Behind  - 2 x daily - 7 x weekly - 2 sets - 10 reps - Seated Shoulder Scaption AAROM with Pulley at Side  - 2 x daily - 7 x weekly - 2 sets - 10 reps - Standing Shoulder Extension with Dowel  - 1 x daily - 7 x weekly - 2 sets - 10 reps - Standing Bilateral Shoulder Internal Rotation AAROM with Dowel  - 1 x daily - 7 x weekly - 2 sets - 10 reps -  Standing Shoulder Flexion AAROM with Dowel  - 1 x daily - 7 x weekly - 2 sets - 10 reps - Supine Shoulder Press with Dowel  - 1 x daily - 7 x weekly - 2 sets - 10 reps - Scapular Retraction with Resistance  - 1 x daily - 5 x weekly - 1 sets - 20 reps - Shoulder extension with resistance - Neutral  - 1 x daily - 5 x weekly - 1 sets - 20 reps - Standing Shoulder Flexion with Resistance  - 1 x daily - 5 x weekly - 1 sets - 20 reps - Standing Single Arm Elbow Flexion with Resistance  - 1 x daily - 5 x weekly - 1 sets - 20 reps   ASSESSMENT:  CLINICAL IMPRESSION: Tx. Focus on R shoulder ROM and standing  resisted ex. At [employer].  Pt has limited scapular mobility due to shoulder ROM limitations.  No pain reported during tx. And pt. Motivated to increase shoulder to assist with daily tasks.  Pt. Remains limited with daily activity/ functional mobility due to chronic back pain.  Pt. Having several imaging appts. To determine if she is a surgical candidate for back issues.  Pt. Is not interested in spinal stimulator.  Pt. will benefit from skilled PT services to continue to address her decreased ROM and strength in RUE in order to optimize strength and function of RUE for return to full independence in all activities.  Pt. Will benefit from core stability ex. Program to improve daily activity/ pain-free mobility.     OBJECTIVE IMPAIRMENTS decreased mobility, decreased ROM, decreased strength, hypomobility, impaired flexibility, improper body mechanics, postural dysfunction, and pain.   ACTIVITY LIMITATIONS carrying, lifting, bathing, toileting, dressing, reach over head, and hygiene/grooming  PARTICIPATION LIMITATIONS: cleaning, driving, shopping, community activity, occupation, and yard work  PERSONAL FACTORS Age, Behavior pattern, Past/current experiences, Time since onset of injury/illness/exacerbation, Transportation, and 3+ comorbidities: HLD, lumbar DDD, OA of cervical spine, scoliosis  are also affecting patient's functional outcome.   REHAB POTENTIAL: Fair History of R sided RTC tears  CLINICAL DECISION MAKING: Evolving/moderate complexity  EVALUATION COMPLEXITY: Moderate   GOALS: Goals reviewed with patient? Yes  SHORT TERM GOALS:  07/27/22  Pt will be independent with HEP to improve R shoulder mobility and strength for ADL completion Baseline: Initiated and given.  9/21: see updated HEP with RTB (good technique) Goal status: Goal met  2.  Pt will improve R shoulder PROM to 90 degrees per protocol in prep for progression of AAROM of R shoulder. Baseline: 74 degrees.  R shoulder 95  deg.  9/21: 98 deg. AROM (pain) Goal status: Goal met   3.  Pt will improve R elbow extension to 0 degrees to prevent elbow contracture for ease of RUE ADL completion.  Baseline: Lacking 30 degrees from terminal extension.  9/21: -2 deg. extension Goal status: Goal met    4.  Pt. Will be independent with core stability ex. Program to improve standing tolerance/ completion of daily household tasks.      Baseline:  pt. Currently not participating with core ex.    Goal status: Initial  LONG TERM GOALS: Target date: 08/24/22  Pt will display full AROM as compared to L shoulder to demonstrate full joint mobility/ overhead reaching.  Baseline: Limited in flexion, ER, Abduction Goal status: Not met Target Date: 08/24/2022  2.  Pt will improve FOTO to target score to demonstrate clinically significant improvement in functional mobility of RUE Baseline: 19  with target score of 55.  9/21: 60 (marked improvement)- limited with overhead reaching/ heavy household chores.   Goal status: Partially met  3.  Pt will demonstrate at least 3/5 strength in RUE in flexion and abduction for overhead ADL completion Baseline: unable to test currently.  9/21:  R shoulder flexion 3-/5 (pain), bicep 4/5, tricep 4+/5, IR 4/5, ER 3-/5, extension 4+/5 MMT Goal status: Not met  4.  Pt will demonstrate > 120 degrees of RUE shoulder flexion to demonstrate adequate mobility to complete needed overhead ADL tasks  Baseline: PROM of 74 degrees flexion, 78 PROM abduction.  9/21: 98 deg. AROM in seated posture Goal status:  Not met    PLAN: PT FREQUENCY: 2x/week  PT DURATION: 8 weeks  PLANNED INTERVENTIONS: Therapeutic exercises, Therapeutic activity, Neuromuscular re-education, Patient/Family education, Self Care, Joint mobilization, Dry Needling, Electrical stimulation, Spinal mobilization, Cryotherapy, Moist heat, and Manual therapy  PLAN FOR NEXT SESSION: Progress R shoulder mobility per protocol.  Strengthening  focus.   ISSUE CORE STABILITY EX.   Pura Spice, PT, DPT # [id number] Physical Therapist- [facility]  07/04/2022, 4:19 PM

## 2022-07-07 ENCOUNTER — Ambulatory Visit: Payer: Medicare HMO | Admitting: Physical Therapy

## 2022-07-07 DIAGNOSIS — M6281 Muscle weakness (generalized): Secondary | ICD-10-CM

## 2022-07-07 DIAGNOSIS — M25511 Pain in right shoulder: Secondary | ICD-10-CM | POA: Diagnosis not present

## 2022-07-07 DIAGNOSIS — M5459 Other low back pain: Secondary | ICD-10-CM | POA: Diagnosis not present

## 2022-07-07 DIAGNOSIS — S42201D Unspecified fracture of upper end of right humerus, subsequent encounter for fracture with routine healing: Secondary | ICD-10-CM

## 2022-07-07 NOTE — Therapy (Signed)
OUTPATIENT PHYSICAL THERAPY SHOULDER TREATMENT  Patient Name: Jennifer Morrow MRN: [id number] DOB:03/22/1951, 71 y.o., female Today's Date: 07/07/22   PT End of Session - 07/07/22 0850     Visit Number 27    Number of Visits 41    Date for PT Re-Evaluation 08/24/22    PT Start Time 0857    PT Stop Time 0945    PT Time Calculation (min) 48 min    Activity Tolerance Patient tolerated treatment well;Patient limited by pain    Behavior During Therapy WFL for tasks assessed/performed             Past Medical History:  Diagnosis Date   Arthritis    Bronchitis    CHRONIC   Bulging lumbar disc    degenerative dics   Scoliosis    LIMITS LUNG FUNCTION   Spinal stenosis    Past Surgical History:  Procedure Laterality Date   CATARACT EXTRACTION W/PHACO Left 01/06/2018   Procedure: CATARACT EXTRACTION PHACO AND INTRAOCULAR LENS PLACEMENT (IOC);  Surgeon: King, Bradley Mark, MD;  Location: [facility];  Service: Ophthalmology;  Laterality: Left;  US 00:27.5 AP% 4.3 CDE 1.18 FLUID PACK LOT # [id number]   CATARACT EXTRACTION W/PHACO Right 02/10/2018   Procedure: CATARACT EXTRACTION PHACO AND INTRAOCULAR LENS PLACEMENT (IOC);  Surgeon: King, Bradley Mark, MD;  Location: [facility];  Service: Ophthalmology;  Laterality: Right;  US 00:30.5 AP% 5.0 CDE 1.51  Fluid pack lot # [id number]   FOOT X 2     HEEL SPUR EXCISION     TONSILLECTOMY     Patient Active Problem List   Diagnosis Date Noted   Osteoarthritis cervical spine 01/14/2022   Age-related osteoporosis without current pathological fracture 05/19/2021   Spondylosis of lumbar region without myelopathy or radiculopathy 05/19/2021   Degenerative disc disease, lumbar 05/19/2021   Scoliosis, unspecified 01/09/2015   Vitamin D deficiency 01/08/2014   Mixed hyperlipidemia 01/08/2014    PCP: Berglund, Laura MD  REFERRING PROVIDER: Crawford, Matthew MD  REFERRING DIAG: Closed fracture of proximal right humerus   THERAPY DIAG:  Acute  pain of right shoulder  Muscle weakness (generalized)  Closed fracture of proximal end of right humerus with routine healing, unspecified fracture morphology, subsequent encounter  Other low back pain  Rationale for Evaluation and Treatment Rehabilitation  ONSET DATE: 01/30/22  SUBJECTIVE:                                                                                                                                                                                      SUBJECTIVE STATEMENT:  EVALUATION Pt is s/p R proximal humerus fracture on 01/30/22 for conservative management. Pt   has been doing her HEP, and is able to roll briefly onto her L shoulder while sleeping. 0/10 pain prior to tx. Pt has pain while dressing herself, and is not able to fully independently don/doff a shirt yet. Pt stated that she has pain in her R low back, with numbness down her LLE and L low back but is chronic pain she has dealt with for years. Pt currently sleeps in adjustable bed, with the ability to raise the head/feet of the bed. Has been compliant with HEP reporting shoulder is moving better with less pain with mobility.   PERTINENT HISTORY: Pt presenting with R shoulder pain s/p non operative proximal humerus fracture. Pt reports having a tent fall on her leading her to fall to the ground on her R side. Pt here with non-operative PT protocol. Reports orthopedic MD planning for 6-8 weeks of donning sling. Pt has history of R sided RTC tears and orthopedic MD stating return to full AROM in RUE may not be feasible. No MRI for RTC tissue quality as pt reports MD wants to see how pt manages with AROM with fracture recovery. Pt is R handed. Currently receiving HH aide for 4 hours in the morning to assist ADL's primarily dressing. No longer requiring pain meds except Tylenol PRN. Pain vastly has improved, worst pain nowadays is a 4/10 NPS sporadically. Has been working on elbow extension in recliner, some numbness in hand at  night palmar and dorsal. Rated as moderate, unsure of time of numbness.   PAIN:  Are you having pain? Yes: NPRS scale: 0/10 Pain location: R shoulder Pain description: Dull/achey Aggravating factors: RUE movement Relieving factors: Rest  PRECAUTIONS: See protocol in chart  WEIGHT BEARING RESTRICTIONS Yes WBAT on RUE  FALLS:  Has patient fallen in last 6 months? No  LIVING ENVIRONMENT: Lives with: lives with their family and lives alone Lives in: House/apartment Stairs: Yes: External: 3 steps; on right going up Has following equipment at home: None  OCCUPATION: Retired  PLOF: [place] motion/strength. Return to full independence.    OBJECTIVE:   DIAGNOSTIC FINDINGS:  XR Shoulder 3 Or More Views Right  Final Result   Acute, comminuted and anteriorly displaced fracture of the right proximal humerus with involvement of the surgical and anatomic necks and fragmentation of the greater tuberosity. Large fracture fragment containing the majority of the glenoid articular surface is rotated laterally.   PATIENT SURVEYS:  FOTO 19 with target of 83  COGNITION: Overall cognitive status: Within functional limits for tasks assessed     SENSATION: WFL  POSTURE: Upper trap activation most likely due to wearing sling. Overall WNL.    CERVICAL AROM:  Flexion: 50 degrees Extension: 45 degrees Rotation R/L: 50/45 degrees Lateral flexion (R/L): 30/35 degrees   UPPER EXTREMITY ROM:   Active ROM Right eval Left eval  Shoulder flexion PROM 74 Full  Shoulder extension    Shoulder abduction PROM 78 Full  Shoulder adduction    Shoulder internal rotation Deferred til week 6 per protocol Full  Shoulder external rotation 16 Full  Elbow flexion 129 Full  Elbow extension 30 from full ext 0 degrees   UPPER EXTREMITY MMT:    RUE deferred due to NWB status and in sling MMT Right eval Left eval  Shoulder flexion  5/5  Shoulder extension     Shoulder abduction  5/5  Shoulder adduction    Shoulder internal rotation  5/5  Shoulder external rotation  5/5  Middle trapezius    Lower trapezius    Elbow flexion  5/5  Elbow extension  5/5  Wrist flexion  5/5  Wrist extension  5/5  Wrist ulnar deviation    Wrist radial deviation    Wrist pronation    Wrist supination    Grip strength (lbs) 30.1 lbs (avg 2 trials) 36.5 lbs (avg 2 trials)  (Blank rows = not tested)   PALPATION: TTP along proximal humerus of RUE.     04/02/22:  R shoulder A/PROM in supine: flexion (94 deg.), abduction (92 deg.), ER (33 deg.), IR (78 deg.).  Supine R shoulder PROM flexion 124 deg. (Pain).  Standing R shoulder AROM: flexion (85 deg.), abduction (82 deg.).    04/17/22:  Yellow bulb grasping on R: 2 psi.  Handheld dynamometer: L 56.2#/ R 35.7#.    05/07/22:  Grip strength: L 61#/ R 42# (marked improvement).   Lumbar screen:   Lumbar rotn. (Tight in supine)/ SLR 60 deg./ pain with bridging).    B LE strength grossly 5/5 MMT except hip flexion/ abduction 4/5 MMT.     TODAY'S TREATMENT (07/07/22)  SUBJECTIVE:   Pt. States she returned to deep water ex. Program and did well.  Pt. Was really tired but it felt good.  Pt. C/o 1/10 R shoulder pain.       PAIN: R shoulder 1/10 prior to tx. Session.  Pt. Reports 1/10 LBP in morning and >5/10 in afternoon.    Ther-ex.:   07/07/22:    There.ex.:  Nustep L5 10 min. B UE/LE.  Discussed aquatic ex./ weekend activities.     Standing R shoulder AROM (all planes)- 5x each with mirror feedback.    Nautilus (with wand): seated lat. Pull downs 50#/ standing tricep extension 30#/ standing scap. Retraction 40#/ chest press 20#/ shoulder adduction with handles 20# 20x each.      3# B shoulder ER/ B shoulder shrugs/ bicep curls 20x.    Reviewed lumbar stab. Ex. Program   Supine R shoulder AA/PROM (all planes)- with holds as tolerated.  Focus on IR/ER.    Supine hip/lumbar generalized stretches (pain  tolerable range)- benefits from gentle rotn. To L/R.      Pt. Will ice at home (as needed).       PATIENT EDUCATION: Education details: form/technique with exercise and updated HEP.  Access Code: [email or web address] Person educated: Patient Education method: Explanation, Demonstration, Tactile cues, Verbal cues, and Handouts Education comprehension: verbalized understanding, returned demonstration, and needs further education   HOME EXERCISE PROGRAM: Access Code: [email or web address]  Access Code: [email or web address] URL: [email or web address] Date: 03/12/2022 Prepared by: Michael Sherk   Exercises - Seated Shoulder Flexion AAROM with Pulley Behind  - 2 x daily - 7 x weekly - 2 sets - 10 reps - Seated Shoulder Scaption AAROM with Pulley at Side  - 2 x daily - 7 x weekly - 2 sets - 10 reps - Supine Shoulder Press with Dowel  - 1 x daily - 7 x weekly - 3 sets - 10 reps   Access Code: [email or web address] URL: [email or web address] Date: 05/07/2022 Prepared by: Michael Sherk  Exercises - Seated Shoulder Flexion AAROM with Pulley Behind  - 2 x daily - 7 x weekly - 2 sets - 10 reps - Seated Shoulder Scaption AAROM with Pulley at Side  - 2 x daily - 7 x weekly - 2 sets - 10 reps - Standing Shoulder Extension with Dowel  - 1 x daily - 7 x weekly - 2   sets - 10 reps - Standing Bilateral Shoulder Internal Rotation AAROM with Dowel  - 1 x daily - 7 x weekly - 2 sets - 10 reps - Standing Shoulder Flexion AAROM with Dowel  - 1 x daily - 7 x weekly - 2 sets - 10 reps - Supine Shoulder Press with Dowel  - 1 x daily - 7 x weekly - 2 sets - 10 reps - Scapular Retraction with Resistance  - 1 x daily - 5 x weekly - 1 sets - 20 reps - Shoulder extension with resistance - Neutral  - 1 x daily - 5 x weekly - 1 sets - 20 reps - Standing Shoulder Flexion with Resistance  - 1 x daily - 5 x weekly - 1 sets - 20 reps - Standing Single Arm Elbow Flexion with Resistance  - 1 x daily - 5 x weekly - 1 sets - 20  reps   Access Code: [email or web address] URL: [email or web address] Date: 07/04/2022 Prepared by: Michael Sherk  Exercises - Hooklying Transversus Abdominis Palpation  - 1 x daily - 7 x weekly - 2 sets - 10 reps - 3 hold - Supine Posterior Pelvic Tilt  - 1 x daily - 7 x weekly - 2 sets - 10 reps - 3 hold - Supine Transversus Abdominis Bracing with Heel Slide  - 1 x daily - 7 x weekly - 2 sets - 10 reps - Supine March  - 1 x daily - 7 x weekly - 2 sets - 10 reps - Supine Lower Trunk Rotation  - 1 x daily - 7 x weekly - 1 sets - 3 reps - 2 hold - Hooklying Single Knee to Chest Stretch  - 1 x daily - 7 x weekly - 1 sets - 3 reps   ASSESSMENT:  CLINICAL IMPRESSION:  Pt. Remains limited with daily activity/ functional mobility due to chronic back pain.  PT issued TrA ex. With progression for HEP.   Pt. Continues to benefit from UE strengthening/ shoulder stretches to improve functional mobility.  No change to HEP at this time.  Pt. Will continue with core ex./ use of R UE with daily tasks and HEP.   Pt. will benefit from skilled PT services to continue to address her decreased ROM and strength in RUE in order to optimize strength and function of RUE for return to full independence in all activities.  Pt. Will benefit from core stability ex. Program to improve daily activity/ pain-free mobility.     OBJECTIVE IMPAIRMENTS decreased mobility, decreased ROM, decreased strength, hypomobility, impaired flexibility, improper body mechanics, postural dysfunction, and pain.   ACTIVITY LIMITATIONS carrying, lifting, bathing, toileting, dressing, reach over head, and hygiene/grooming  PARTICIPATION LIMITATIONS: cleaning, driving, shopping, community activity, occupation, and yard work  PERSONAL FACTORS Age, Behavior pattern, Past/current experiences, Time since onset of injury/illness/exacerbation, Transportation, and 3+ comorbidities: HLD, lumbar DDD, OA of cervical spine, scoliosis  are also  affecting patient's functional outcome.   REHAB POTENTIAL: Fair History of R sided RTC tears  CLINICAL DECISION MAKING: Evolving/moderate complexity  EVALUATION COMPLEXITY: Moderate   GOALS: Goals reviewed with patient? Yes  SHORT TERM GOALS:  07/27/22  Pt will be independent with HEP to improve R shoulder mobility and strength for ADL completion Baseline: Initiated and given.  9/21: see updated HEP with RTB (good technique) Goal status: Goal met  2.  Pt will improve R shoulder PROM to 90 degrees per protocol in prep for progression of AAROM of R shoulder. Baseline: 74   degrees.  R shoulder 95 deg.  9/21: 98 deg. AROM (pain) Goal status: Goal met   3.  Pt will improve R elbow extension to 0 degrees to prevent elbow contracture for ease of RUE ADL completion.  Baseline: Lacking 30 degrees from terminal extension.  9/21: -2 deg. extension Goal status: Goal met    4.  Pt. Will be independent with core stability ex. Program to improve standing tolerance/ completion of daily household tasks.      Baseline:  pt. Currently not participating with core ex.    Goal status: Initial  LONG TERM GOALS: Target date: 08/24/22  Pt will display full AROM as compared to L shoulder to demonstrate full joint mobility/ overhead reaching.  Baseline: Limited in flexion, ER, Abduction Goal status: Not met Target Date: 08/24/2022  2.  Pt will improve FOTO to target score to demonstrate clinically significant improvement in functional mobility of RUE Baseline: 19 with target score of 55.  9/21: 60 (marked improvement)- limited with overhead reaching/ heavy household chores.   Goal status: Partially met  3.  Pt will demonstrate at least 3/5 strength in RUE in flexion and abduction for overhead ADL completion Baseline: unable to test currently.  9/21:  R shoulder flexion 3-/5 (pain), bicep 4/5, tricep 4+/5, IR 4/5, ER 3-/5, extension 4+/5 MMT Goal status: Not met  4.  Pt will demonstrate > 120 degrees of  RUE shoulder flexion to demonstrate adequate mobility to complete needed overhead ADL tasks  Baseline: PROM of 74 degrees flexion, 78 PROM abduction.  9/21: 98 deg. AROM in seated posture Goal status:  Not met    PLAN: PT FREQUENCY: 2x/week  PT DURATION: 8 weeks  PLANNED INTERVENTIONS: Therapeutic exercises, Therapeutic activity, Neuromuscular re-education, Patient/Family education, Self Care, Joint mobilization, Dry Needling, Electrical stimulation, Spinal mobilization, Cryotherapy, Moist heat, and Manual therapy  PLAN FOR NEXT SESSION: Progress R shoulder mobility per protocol.  Strengthening focus.      Pura Spice, PT, DPT # [phone] Physical Therapist- [facility]  07/07/2022, 9:46 AM

## 2022-07-14 ENCOUNTER — Ambulatory Visit: Payer: Medicare HMO | Admitting: Physical Therapy

## 2022-07-14 DIAGNOSIS — M25511 Pain in right shoulder: Secondary | ICD-10-CM

## 2022-07-14 DIAGNOSIS — S42201D Unspecified fracture of upper end of right humerus, subsequent encounter for fracture with routine healing: Secondary | ICD-10-CM

## 2022-07-14 DIAGNOSIS — M6281 Muscle weakness (generalized): Secondary | ICD-10-CM | POA: Diagnosis not present

## 2022-07-14 DIAGNOSIS — M5459 Other low back pain: Secondary | ICD-10-CM

## 2022-07-14 NOTE — Therapy (Signed)
OUTPATIENT PHYSICAL THERAPY SHOULDER TREATMENT  Patient Name: Jennifer Morrow MRN: [id number] DOB:Apr 07, 1951, 71 y.o., female Today's Date: 07/14/22   PT End of Session - 07/14/22 0908     Visit Number 28    Number of Visits 41    Date for PT Re-Evaluation 08/24/22    PT Start Time 0859    Activity Tolerance Patient tolerated treatment well;Patient limited by pain    Behavior During Therapy [facility] for tasks assessed/performed             Past Medical History:  Diagnosis Date   Arthritis    Bronchitis    CHRONIC   Bulging lumbar disc    degenerative dics   Scoliosis    LIMITS LUNG FUNCTION   Spinal stenosis    Past Surgical History:  Procedure Laterality Date   CATARACT EXTRACTION W/PHACO Left 01/06/2018   Procedure: CATARACT EXTRACTION PHACO AND INTRAOCULAR LENS PLACEMENT ([place]);  Surgeon: Eulogio Bear, MD;  Location: [facility] ORS;  Service: Ophthalmology;  Laterality: Left;  [place] 00:27.5 AP% 4.3 CDE 1.18 FLUID PACK LOT # [id number]   CATARACT EXTRACTION W/PHACO Right 02/10/2018   Procedure: CATARACT EXTRACTION PHACO AND INTRAOCULAR LENS PLACEMENT ([facility]);  Surgeon: Eulogio Bear, MD;  Location: [facility] ORS;  Service: Ophthalmology;  Laterality: Right;  [place] 00:30.5 AP% 5.0 CDE 1.51  Fluid pack lot # [id number]   FOOT X 2     HEEL SPUR EXCISION     TONSILLECTOMY     Patient Active Problem List   Diagnosis Date Noted   Osteoarthritis cervical spine 01/14/2022   Age-related osteoporosis without current pathological fracture 05/19/2021   Spondylosis of lumbar region without myelopathy or radiculopathy 05/19/2021   Degenerative disc disease, lumbar 05/19/2021   Scoliosis, unspecified 01/09/2015   Vitamin D deficiency 01/08/2014   Mixed hyperlipidemia 01/08/2014    PCP: Halina Maidens MD  REFERRING PROVIDER: Renee Harder MD  REFERRING DIAG: Closed fracture of proximal right humerus   THERAPY DIAG:  Acute pain of right shoulder  Muscle weakness  (generalized)  Closed fracture of proximal end of right humerus with routine healing, unspecified fracture morphology, subsequent encounter  Other low back pain  Rationale for Evaluation and Treatment Rehabilitation  ONSET DATE: 01/30/22  SUBJECTIVE:                                                                                                                                                                                      SUBJECTIVE STATEMENT:  EVALUATION Pt is s/p R proximal humerus fracture on 01/30/22 for conservative management. Pt has been doing her HEP, and is able to roll briefly onto her L shoulder while  sleeping. 0/10 pain prior to tx. Pt has pain while dressing herself, and is not able to fully independently don/doff a shirt yet. Pt stated that she has pain in her R low back, with numbness down her LLE and L low back but is chronic pain she has dealt with for years. Pt currently sleeps in adjustable bed, with the ability to raise the head/feet of the bed. Has been compliant with HEP reporting shoulder is moving better with less pain with mobility.   PERTINENT HISTORY: Pt presenting with R shoulder pain s/p non operative proximal humerus fracture. Pt reports having a tent fall on her leading her to fall to the ground on her R side. Pt here with non-operative PT protocol. Reports orthopedic MD planning for 6-8 weeks of donning sling. Pt has history of R sided RTC tears and orthopedic MD stating return to full AROM in RUE may not be feasible. No MRI for RTC tissue quality as pt reports MD wants to see how pt manages with AROM with fracture recovery. Pt is R handed. Currently receiving HH aide for 4 hours in the morning to assist ADL's primarily dressing. No longer requiring pain meds except Tylenol PRN. Pain vastly has improved, worst pain nowadays is a 4/10 NPS sporadically. Has been working on elbow extension in recliner, some numbness in hand at night palmar and dorsal. Rated as  moderate, unsure of time of numbness.   PAIN:  Are you having pain? Yes: NPRS scale: 0/10 Pain location: R shoulder Pain description: Dull/achey Aggravating factors: RUE movement Relieving factors: Rest  PRECAUTIONS: See protocol in chart  WEIGHT BEARING RESTRICTIONS Yes WBAT on RUE  FALLS:  Has patient fallen in last 6 months? No  LIVING ENVIRONMENT: Lives with: lives with their family and lives alone Lives in: House/apartment Stairs: Yes: External: 3 steps; on right going up Has following equipment at home: None  OCCUPATION: Retired  PLOF: [place] motion/strength. Return to full independence.    OBJECTIVE:   DIAGNOSTIC FINDINGS:  XR Shoulder 3 Or More Views Right  Final Result   Acute, comminuted and anteriorly displaced fracture of the right proximal humerus with involvement of the surgical and anatomic necks and fragmentation of the greater tuberosity. Large fracture fragment containing the majority of the glenoid articular surface is rotated laterally.   PATIENT SURVEYS:  FOTO 19 with target of 87  COGNITION: Overall cognitive status: Within functional limits for tasks assessed     SENSATION: WFL  POSTURE: Upper trap activation most likely due to wearing sling. Overall WNL.    CERVICAL AROM:  Flexion: 50 degrees Extension: 45 degrees Rotation R/L: 50/45 degrees Lateral flexion (R/L): 30/35 degrees   UPPER EXTREMITY ROM:   Active ROM Right eval Left eval  Shoulder flexion PROM 74 Full  Shoulder extension    Shoulder abduction PROM 78 Full  Shoulder adduction    Shoulder internal rotation Deferred til week 6 per protocol Full  Shoulder external rotation 16 Full  Elbow flexion 129 Full  Elbow extension 30 from full ext 0 degrees   UPPER EXTREMITY MMT:    RUE deferred due to NWB status and in sling MMT Right eval Left eval  Shoulder flexion  5/5  Shoulder extension    Shoulder abduction  5/5  Shoulder  adduction    Shoulder internal rotation  5/5  Shoulder external rotation  5/5  Middle trapezius    Lower trapezius    Elbow flexion  5/5  Elbow  extension  5/5  Wrist flexion  5/5  Wrist extension  5/5  Wrist ulnar deviation    Wrist radial deviation    Wrist pronation    Wrist supination    Grip strength (lbs) 30.1 lbs (avg 2 trials) 36.5 lbs (avg 2 trials)  (Blank rows = not tested)   PALPATION: TTP along proximal humerus of RUE.     04/02/22:  R shoulder A/PROM in supine: flexion (94 deg.), abduction (92 deg.), ER (33 deg.), IR (78 deg.).  Supine R shoulder PROM flexion 124 deg. (Pain).  Standing R shoulder AROM: flexion (85 deg.), abduction (82 deg.).    04/17/22:  Yellow bulb grasping on R: 2 psi.  Handheld dynamometer: L 56.2#/ R 35.7#.    05/07/22:  Grip strength: L 61#/ R 42# (marked improvement).   Lumbar screen:   Lumbar rotn. (Tight in supine)/ SLR 60 deg./ pain with bridging).    B LE strength grossly 5/5 MMT except hip flexion/ abduction 4/5 MMT.     TODAY'S TREATMENT (07/14/22)  SUBJECTIVE:   Pt. States she went to [facility] for Thanksgiving and had a nice trip but tired.  Pt. States she has not had to use ice for past 6 days at night.  Pt. C/o 1/10 R shoulder pain.  Pt. Has MRI scheduled for this Friday.       PAIN: R shoulder 1/10 prior to tx. Session.  Pt. Reports 1/10 LBP in morning and >5/10 in afternoon.    Ther-ex.:   07/14/22:    There.ex.:  B UBE 4 min. F/b.  Discussed Thanksgiving break/ pt. Leaves for FL on 12/20.      Nautilus (with wand): seated lat. Pull downs 50#/ standing tricep extension 30#/ standing shoulder extension 30#/ standing scap. Retraction 40#/ chest press 20#/ shoulder adduction with handles 20# 20x each.    Focus on maintaining R elbow extension.    Seated B shoulder ER (with added OP), bicep curls/ punches 10x.    Seated R shoulder AA/PROM (all planes)- with holds as tolerated.  Focus on IR/ER.    Reassessment of R shoulder AROM at  end of tx.     Pt. Will ice at home (as needed).       PATIENT EDUCATION: Education details: form/technique with exercise and updated HEP.  Access Code: [email or web address] Person educated: Patient Education method: Explanation, Demonstration, Tactile cues, Verbal cues, and Handouts Education comprehension: verbalized understanding, returned demonstration, and needs further education   HOME EXERCISE PROGRAM: Access Code: [email or web address]  Access Code: [email or web address] URL: [email or web address] Date: 03/12/2022 Prepared by: Dorcas Carrow   Exercises - Seated Shoulder Flexion AAROM with Pulley Behind  - 2 x daily - 7 x weekly - 2 sets - 10 reps - Seated Shoulder Scaption AAROM with Pulley at Side  - 2 x daily - 7 x weekly - 2 sets - 10 reps - Supine Shoulder Press with Dowel  - 1 x daily - 7 x weekly - 3 sets - 10 reps   Access Code: [email or web address] URL: [email or web address] Date: 05/07/2022 Prepared by: Dorcas Carrow  Exercises - Seated Shoulder Flexion AAROM with Pulley Behind  - 2 x daily - 7 x weekly - 2 sets - 10 reps - Seated Shoulder Scaption AAROM with Pulley at Side  - 2 x daily - 7 x weekly - 2 sets - 10 reps - Standing Shoulder Extension with Dowel  - 1 x daily - 7 x weekly - 2 sets - 10 reps - Standing Bilateral Shoulder  Internal Rotation AAROM with Dowel  - 1 x daily - 7 x weekly - 2 sets - 10 reps - Standing Shoulder Flexion AAROM with Dowel  - 1 x daily - 7 x weekly - 2 sets - 10 reps - Supine Shoulder Press with Dowel  - 1 x daily - 7 x weekly - 2 sets - 10 reps - Scapular Retraction with Resistance  - 1 x daily - 5 x weekly - 1 sets - 20 reps - Shoulder extension with resistance - Neutral  - 1 x daily - 5 x weekly - 1 sets - 20 reps - Standing Shoulder Flexion with Resistance  - 1 x daily - 5 x weekly - 1 sets - 20 reps - Standing Single Arm Elbow Flexion with Resistance  - 1 x daily - 5 x weekly - 1 sets - 20 reps   Access Code: [email or web address] URL:  [email or web address] Date: 07/04/2022 Prepared by: Dorcas Carrow  Exercises - Hooklying Transversus Abdominis Palpation  - 1 x daily - 7 x weekly - 2 sets - 10 reps - 3 hold - Supine Posterior Pelvic Tilt  - 1 x daily - 7 x weekly - 2 sets - 10 reps - 3 hold - Supine Transversus Abdominis Bracing with Heel Slide  - 1 x daily - 7 x weekly - 2 sets - 10 reps - Supine March  - 1 x daily - 7 x weekly - 2 sets - 10 reps - Supine Lower Trunk Rotation  - 1 x daily - 7 x weekly - 1 sets - 3 reps - 2 hold - Hooklying Single Knee to Chest Stretch  - 1 x daily - 7 x weekly - 1 sets - 3 reps   ASSESSMENT:  CLINICAL IMPRESSION:  Pt. Remains limited with daily activity/ functional mobility due to chronic back pain.  Pt. Continues to benefit from UE strengthening/ shoulder stretches to improve functional mobility.  Pt. Requires rest breaks due to easily fatigued with increase daily activity.  Pt. Will continue with core ex./ use of R UE with daily tasks and HEP.   Pt. will benefit from skilled PT services to continue to address her decreased ROM and strength in RUE in order to optimize strength and function of RUE for return to full independence in all activities.  Pt. Will benefit from core stability ex. Program to improve daily activity/ pain-free mobility.     OBJECTIVE IMPAIRMENTS decreased mobility, decreased ROM, decreased strength, hypomobility, impaired flexibility, improper body mechanics, postural dysfunction, and pain.   ACTIVITY LIMITATIONS carrying, lifting, bathing, toileting, dressing, reach over head, and hygiene/grooming  PARTICIPATION LIMITATIONS: cleaning, driving, shopping, community activity, occupation, and yard work  PERSONAL FACTORS Age, Behavior pattern, Past/current experiences, Time since onset of injury/illness/exacerbation, Transportation, and 3+ comorbidities: HLD, lumbar DDD, OA of cervical spine, scoliosis  are also affecting patient's functional outcome.    REHAB POTENTIAL: Fair History of R sided RTC tears  CLINICAL DECISION MAKING: Evolving/moderate complexity  EVALUATION COMPLEXITY: Moderate   GOALS: Goals reviewed with patient? Yes  SHORT TERM GOALS:  07/27/22  Pt will be independent with HEP to improve R shoulder mobility and strength for ADL completion Baseline: Initiated and given.  9/21: see updated HEP with RTB (good technique) Goal status: Goal met  2.  Pt will improve R shoulder PROM to 90 degrees per protocol in prep for progression of AAROM of R shoulder. Baseline: 74 degrees.  R shoulder 95 deg.  9/21: 98 deg. AROM (pain) Goal  status: Goal met   3.  Pt will improve R elbow extension to 0 degrees to prevent elbow contracture for ease of RUE ADL completion.  Baseline: Lacking 30 degrees from terminal extension.  9/21: -2 deg. extension Goal status: Goal met    4.  Pt. Will be independent with core stability ex. Program to improve standing tolerance/ completion of daily household tasks.      Baseline:  pt. Currently not participating with core ex.    Goal status: Initial  LONG TERM GOALS: Target date: 08/24/22  Pt will display full AROM as compared to L shoulder to demonstrate full joint mobility/ overhead reaching.  Baseline: Limited in flexion, ER, Abduction Goal status: Not met Target Date: 08/24/2022  2.  Pt will improve FOTO to target score to demonstrate clinically significant improvement in functional mobility of RUE Baseline: 19 with target score of 55.  9/21: 60 (marked improvement)- limited with overhead reaching/ heavy household chores.   Goal status: Partially met  3.  Pt will demonstrate at least 3/5 strength in RUE in flexion and abduction for overhead ADL completion Baseline: unable to test currently.  9/21:  R shoulder flexion 3-/5 (pain), bicep 4/5, tricep 4+/5, IR 4/5, ER 3-/5, extension 4+/5 MMT Goal status: Not met  4.  Pt will demonstrate > 120 degrees of RUE shoulder flexion to demonstrate  adequate mobility to complete needed overhead ADL tasks  Baseline: PROM of 74 degrees flexion, 78 PROM abduction.  9/21: 98 deg. AROM in seated posture Goal status:  Not met    PLAN: PT FREQUENCY: 2x/week  PT DURATION: 8 weeks  PLANNED INTERVENTIONS: Therapeutic exercises, Therapeutic activity, Neuromuscular re-education, Patient/Family education, Self Care, Joint mobilization, Dry Needling, Electrical stimulation, Spinal mobilization, Cryotherapy, Moist heat, and Manual therapy  PLAN FOR NEXT SESSION: Progress R shoulder mobility per protocol.  Strengthening focus.      Pura Spice, PT, DPT # [phone] Physical Therapist- [facility]  07/14/2022, 9:09 AM

## 2022-07-16 ENCOUNTER — Ambulatory Visit: Payer: Medicare HMO | Admitting: Physical Therapy

## 2022-07-16 DIAGNOSIS — M6281 Muscle weakness (generalized): Secondary | ICD-10-CM | POA: Diagnosis not present

## 2022-07-16 DIAGNOSIS — S42201D Unspecified fracture of upper end of right humerus, subsequent encounter for fracture with routine healing: Secondary | ICD-10-CM | POA: Diagnosis not present

## 2022-07-16 DIAGNOSIS — M25511 Pain in right shoulder: Secondary | ICD-10-CM | POA: Diagnosis not present

## 2022-07-16 DIAGNOSIS — M5459 Other low back pain: Secondary | ICD-10-CM

## 2022-07-17 DIAGNOSIS — M5442 Lumbago with sciatica, left side: Secondary | ICD-10-CM | POA: Diagnosis not present

## 2022-07-17 DIAGNOSIS — M47816 Spondylosis without myelopathy or radiculopathy, lumbar region: Secondary | ICD-10-CM | POA: Diagnosis not present

## 2022-07-17 DIAGNOSIS — G8929 Other chronic pain: Secondary | ICD-10-CM | POA: Diagnosis not present

## 2022-07-17 DIAGNOSIS — M4316 Spondylolisthesis, lumbar region: Secondary | ICD-10-CM | POA: Diagnosis not present

## 2022-07-17 DIAGNOSIS — M47814 Spondylosis without myelopathy or radiculopathy, thoracic region: Secondary | ICD-10-CM | POA: Diagnosis not present

## 2022-07-17 DIAGNOSIS — M9973 Connective tissue and disc stenosis of intervertebral foramina of lumbar region: Secondary | ICD-10-CM | POA: Diagnosis not present

## 2022-07-17 DIAGNOSIS — M419 Scoliosis, unspecified: Secondary | ICD-10-CM | POA: Diagnosis not present

## 2022-07-18 NOTE — Therapy (Signed)
OUTPATIENT PHYSICAL THERAPY SHOULDER TREATMENT  Patient Name: Jennifer Morrow MRN: [id number] DOB:[birth year], 71 y.o., female Today's Date: 07/16/22   PT End of Session - 07/18/22 1009     Visit Number 29    Number of Visits 41    Date for PT Re-Evaluation 08/24/22    PT Start Time 0858    PT Stop Time 0946    PT Time Calculation (min) 48 min    Activity Tolerance Patient tolerated treatment well;Patient limited by pain    Behavior During Therapy [facility] for tasks assessed/performed             Past Medical History:  Diagnosis Date   Arthritis    Bronchitis    CHRONIC   Bulging lumbar disc    degenerative dics   Scoliosis    LIMITS LUNG FUNCTION   Spinal stenosis    Past Surgical History:  Procedure Laterality Date   CATARACT EXTRACTION W/PHACO Left 01/06/2018   Procedure: CATARACT EXTRACTION PHACO AND INTRAOCULAR LENS PLACEMENT ([place]);  Surgeon: Eulogio Bear, MD;  Location: [facility] ORS;  Service: Ophthalmology;  Laterality: Left;  [place] 00:27.5 AP% 4.3 CDE 1.18 FLUID PACK LOT # [id number]   CATARACT EXTRACTION W/PHACO Right 02/10/2018   Procedure: CATARACT EXTRACTION PHACO AND INTRAOCULAR LENS PLACEMENT ([facility]);  Surgeon: Eulogio Bear, MD;  Location: [facility] ORS;  Service: Ophthalmology;  Laterality: Right;  [place] 00:30.5 AP% 5.0 CDE 1.51  Fluid pack lot # [id number]   FOOT X 2     HEEL SPUR EXCISION     TONSILLECTOMY     Patient Active Problem List   Diagnosis Date Noted   Osteoarthritis cervical spine 01/14/2022   Age-related osteoporosis without current pathological fracture 05/19/2021   Spondylosis of lumbar region without myelopathy or radiculopathy 05/19/2021   Degenerative disc disease, lumbar 05/19/2021   Scoliosis, unspecified 01/09/2015   Vitamin D deficiency 01/08/2014   Mixed hyperlipidemia 01/08/2014    PCP: Halina Maidens MD  REFERRING PROVIDER: Renee Harder MD  REFERRING DIAG: Closed fracture of proximal right humerus   THERAPY DIAG:  Acute  pain of right shoulder  Muscle weakness (generalized)  Closed fracture of proximal end of right humerus with routine healing, unspecified fracture morphology, subsequent encounter  Other low back pain  Rationale for Evaluation and Treatment Rehabilitation  ONSET DATE: 01/30/22  SUBJECTIVE:                                                                                                                                                                                      SUBJECTIVE STATEMENT:  EVALUATION Pt is s/p R proximal humerus fracture on 01/30/22 for conservative management. Pt  has been doing her HEP, and is able to roll briefly onto her L shoulder while sleeping. 0/10 pain prior to tx. Pt has pain while dressing herself, and is not able to fully independently don/doff a shirt yet. Pt stated that she has pain in her R low back, with numbness down her LLE and L low back but is chronic pain she has dealt with for years. Pt currently sleeps in adjustable bed, with the ability to raise the head/feet of the bed. Has been compliant with HEP reporting shoulder is moving better with less pain with mobility.   PERTINENT HISTORY: Pt presenting with R shoulder pain s/p non operative proximal humerus fracture. Pt reports having a tent fall on her leading her to fall to the ground on her R side. Pt here with non-operative PT protocol. Reports orthopedic MD planning for 6-8 weeks of donning sling. Pt has history of R sided RTC tears and orthopedic MD stating return to full AROM in RUE may not be feasible. No MRI for RTC tissue quality as pt reports MD wants to see how pt manages with AROM with fracture recovery. Pt is R handed. Currently receiving HH aide for 4 hours in the morning to assist ADL's primarily dressing. No longer requiring pain meds except Tylenol PRN. Pain vastly has improved, worst pain nowadays is a 4/10 NPS sporadically. Has been working on elbow extension in recliner, some numbness in hand at  night palmar and dorsal. Rated as moderate, unsure of time of numbness.   PAIN:  Are you having pain? Yes: NPRS scale: 0/10 Pain location: R shoulder Pain description: Dull/achey Aggravating factors: RUE movement Relieving factors: Rest  PRECAUTIONS: See protocol in chart  WEIGHT BEARING RESTRICTIONS Yes WBAT on RUE  FALLS:  Has patient fallen in last 6 months? No  LIVING ENVIRONMENT: Lives with: lives with their family and lives alone Lives in: House/apartment Stairs: Yes: External: 3 steps; on right going up Has following equipment at home: None  OCCUPATION: Retired  PLOF: Independent  PATIENT GOALS Improve RUE motion/strength. Return to full independence.    OBJECTIVE:   DIAGNOSTIC FINDINGS:  XR Shoulder 3 Or More Views Right  Final Result   Acute, comminuted and anteriorly displaced fracture of the right proximal humerus with involvement of the surgical and anatomic necks and fragmentation of the greater tuberosity. Large fracture fragment containing the majority of the glenoid articular surface is rotated laterally.   PATIENT SURVEYS:  FOTO 19 with target of 55  COGNITION: Overall cognitive status: Within functional limits for tasks assessed     SENSATION: WFL  POSTURE: Upper trap activation most likely due to wearing sling. Overall WNL.    CERVICAL AROM:  Flexion: 50 degrees Extension: 45 degrees Rotation R/L: 50/45 degrees Lateral flexion (R/L): 30/35 degrees   UPPER EXTREMITY ROM:   Active ROM Right eval Left eval  Shoulder flexion PROM 74 Full  Shoulder extension    Shoulder abduction PROM 78 Full  Shoulder adduction    Shoulder internal rotation Deferred til week 6 per protocol Full  Shoulder external rotation 16 Full  Elbow flexion 129 Full  Elbow extension 30 from full ext 0 degrees   UPPER EXTREMITY MMT:    RUE deferred due to NWB status and in sling MMT Right eval Left eval  Shoulder flexion  5/5  Shoulder extension     Shoulder abduction  5/5  Shoulder adduction    Shoulder internal rotation  5/5  Shoulder external rotation  5/5    Middle trapezius    Lower trapezius    Elbow flexion  5/5  Elbow extension  5/5  Wrist flexion  5/5  Wrist extension  5/5  Wrist ulnar deviation    Wrist radial deviation    Wrist pronation    Wrist supination    Grip strength (lbs) 30.1 lbs (avg 2 trials) 36.5 lbs (avg 2 trials)  (Blank rows = not tested)   PALPATION: TTP along proximal humerus of RUE.     04/02/22:  R shoulder A/PROM in supine: flexion (94 deg.), abduction (92 deg.), ER (33 deg.), IR (78 deg.).  Supine R shoulder PROM flexion 124 deg. (Pain).  Standing R shoulder AROM: flexion (85 deg.), abduction (82 deg.).    04/17/22:  Yellow bulb grasping on R: 2 psi.  Handheld dynamometer: L 56.2#/ R 35.7#.    05/07/22:  Grip strength: L 61#/ R 42# (marked improvement).   Lumbar screen:   Lumbar rotn. (Tight in supine)/ SLR 60 deg./ pain with bridging).    B LE strength grossly 5/5 MMT except hip flexion/ abduction 4/5 MMT.     TODAY'S TREATMENT (07/16/22)  SUBJECTIVE:   Pt. Reports an increase in R shoulder discomfort past couple days due to increase activity.  Pt. Has returned to icing shoulder at night and not sleeping well.  Pt. Has MRI scheduled for this Friday for back.       PAIN: R shoulder 1/10 prior to tx. Session.  Pt. Reports 1/10 LBP in morning and >5/10 in afternoon.    Ther-ex.:   B UBE 4 min. F/b.  Discussed upcoming MRI for back/ R shoulder symptoms.    Standing wand shoulder flexion/ abduction/ extension/ IR/ chest press 20x each (mirror feedback)- PT AAROM and cuing to increase R elbow extension.    Seated shoulder pulley: flexion/ abduction 20x.      Nautilus (with wand): seated lat. Pull downs 50#/ standing tricep extension 30#/ standing shoulder extension 30#/ standing scap. Retraction 40#/ chest press 20#/ shoulder adduction with handles 20# 20x each.    Focus on maintaining R  elbow extension.    Seated/ supine R shoulder AA/PROM (all planes)- with holds as tolerated.  Focus on IR/ER.    Reassessment of R elbow AROM at end of tx.     Pt. Will ice at home (as needed).       PATIENT EDUCATION: Education details: form/technique with exercise and updated HEP.  Access Code: [email or web address] Person educated: Patient Education method: Explanation, Demonstration, Tactile cues, Verbal cues, and Handouts Education comprehension: verbalized understanding, returned demonstration, and needs further education   HOME EXERCISE PROGRAM: Access Code: [email or web address]  Access Code: [email or web address] URL: [email or web address] Date: 03/12/2022 Prepared by: Dorcas Carrow   Exercises - Seated Shoulder Flexion AAROM with Pulley Behind  - 2 x daily - 7 x weekly - 2 sets - 10 reps - Seated Shoulder Scaption AAROM with Pulley at Side  - 2 x daily - 7 x weekly - 2 sets - 10 reps - Supine Shoulder Press with Dowel  - 1 x daily - 7 x weekly - 3 sets - 10 reps   Access Code: [email or web address] URL: [email or web address] Date: 05/07/2022 Prepared by: Dorcas Carrow  Exercises - Seated Shoulder Flexion AAROM with Pulley Behind  - 2 x daily - 7 x weekly - 2 sets - 10 reps - Seated Shoulder Scaption AAROM with Pulley at Side  - 2 x daily - 7 x weekly - 2 sets - 10 reps - Standing Shoulder  Extension with Dowel  - 1 x daily - 7 x weekly - 2 sets - 10 reps - Standing Bilateral Shoulder Internal Rotation AAROM with Dowel  - 1 x daily - 7 x weekly - 2 sets - 10 reps - Standing Shoulder Flexion AAROM with Dowel  - 1 x daily - 7 x weekly - 2 sets - 10 reps - Supine Shoulder Press with Dowel  - 1 x daily - 7 x weekly - 2 sets - 10 reps - Scapular Retraction with Resistance  - 1 x daily - 5 x weekly - 1 sets - 20 reps - Shoulder extension with resistance - Neutral  - 1 x daily - 5 x weekly - 1 sets - 20 reps - Standing Shoulder Flexion with Resistance  - 1 x daily - 5 x weekly - 1 sets - 20  reps - Standing Single Arm Elbow Flexion with Resistance  - 1 x daily - 5 x weekly - 1 sets - 20 reps   Access Code: [email or web address] URL: [email or web address] Date: 07/04/2022 Prepared by: Dorcas Carrow  Exercises - Hooklying Transversus Abdominis Palpation  - 1 x daily - 7 x weekly - 2 sets - 10 reps - 3 hold - Supine Posterior Pelvic Tilt  - 1 x daily - 7 x weekly - 2 sets - 10 reps - 3 hold - Supine Transversus Abdominis Bracing with Heel Slide  - 1 x daily - 7 x weekly - 2 sets - 10 reps - Supine March  - 1 x daily - 7 x weekly - 2 sets - 10 reps - Supine Lower Trunk Rotation  - 1 x daily - 7 x weekly - 1 sets - 3 reps - 2 hold - Hooklying Single Knee to Chest Stretch  - 1 x daily - 7 x weekly - 1 sets - 3 reps   ASSESSMENT:  CLINICAL IMPRESSION:  Pt. Remains limited with daily activity/ functional mobility due to chronic back pain.  Pt. Continues to benefit from UE strengthening/ shoulder stretches to improve functional mobility.  Pt. Requires rest breaks due to easily fatigued with increase daily activity.  Pt. Will continue with core ex./ use of R UE with daily tasks and HEP.   Pt. will benefit from skilled PT services to continue to address her decreased ROM and strength in RUE in order to optimize strength and function of RUE for return to full independence in all activities.  Pt. Will benefit from core stability ex. Program to improve daily activity/ pain-free mobility.     OBJECTIVE IMPAIRMENTS decreased mobility, decreased ROM, decreased strength, hypomobility, impaired flexibility, improper body mechanics, postural dysfunction, and pain.   ACTIVITY LIMITATIONS carrying, lifting, bathing, toileting, dressing, reach over head, and hygiene/grooming  PARTICIPATION LIMITATIONS: cleaning, driving, shopping, community activity, occupation, and yard work  PERSONAL FACTORS Age, Behavior pattern, Past/current experiences, Time since onset of injury/illness/exacerbation,  Transportation, and 3+ comorbidities: HLD, lumbar DDD, OA of cervical spine, scoliosis  are also affecting patient's functional outcome.   REHAB POTENTIAL: Fair History of R sided RTC tears  CLINICAL DECISION MAKING: Evolving/moderate complexity  EVALUATION COMPLEXITY: Moderate   GOALS: Goals reviewed with patient? Yes  SHORT TERM GOALS:  07/27/22  Pt will be independent with HEP to improve R shoulder mobility and strength for ADL completion Baseline: Initiated and given.  9/21: see updated HEP with RTB (good technique) Goal status: Goal met  2.  Pt will improve R shoulder PROM to 90 degrees per protocol in prep  for progression of AAROM of R shoulder. Baseline: 74 degrees.  R shoulder 95 deg.  9/21: 98 deg. AROM (pain) Goal status: Goal met   3.  Pt will improve R elbow extension to 0 degrees to prevent elbow contracture for ease of RUE ADL completion.  Baseline: Lacking 30 degrees from terminal extension.  9/21: -2 deg. extension Goal status: Goal met    4.  Pt. Will be independent with core stability ex. Program to improve standing tolerance/ completion of daily household tasks.      Baseline:  pt. Currently not participating with core ex.    Goal status: Initial  LONG TERM GOALS: Target date: 08/24/22  Pt will display full AROM as compared to L shoulder to demonstrate full joint mobility/ overhead reaching.  Baseline: Limited in flexion, ER, Abduction Goal status: Not met Target Date: 08/24/2022  2.  Pt will improve FOTO to target score to demonstrate clinically significant improvement in functional mobility of RUE Baseline: 19 with target score of 55.  9/21: 60 (marked improvement)- limited with overhead reaching/ heavy household chores.   Goal status: Partially met  3.  Pt will demonstrate at least 3/5 strength in RUE in flexion and abduction for overhead ADL completion Baseline: unable to test currently.  9/21:  R shoulder flexion 3-/5 (pain), bicep 4/5, tricep 4+/5, IR  4/5, ER 3-/5, extension 4+/5 MMT Goal status: Not met  4.  Pt will demonstrate > 120 degrees of RUE shoulder flexion to demonstrate adequate mobility to complete needed overhead ADL tasks  Baseline: PROM of 74 degrees flexion, 78 PROM abduction.  9/21: 98 deg. AROM in seated posture Goal status:  Not met    PLAN: PT FREQUENCY: 2x/week  PT DURATION: 8 weeks  PLANNED INTERVENTIONS: Therapeutic exercises, Therapeutic activity, Neuromuscular re-education, Patient/Family education, Self Care, Joint mobilization, Dry Needling, Electrical stimulation, Spinal mobilization, Cryotherapy, Moist heat, and Manual therapy  PLAN FOR NEXT SESSION: Progress R shoulder mobility per protocol.  Strengthening focus.   10th visit progress note next tx.  Decrease frequency to 1x/week until leaving for [place] in  3 weeks.    Pura Spice, PT, DPT # [phone] Physical Therapist- [facility]  07/18/2022, 10:12 AM

## 2022-07-23 ENCOUNTER — Ambulatory Visit: Payer: Medicare HMO | Attending: Orthopaedic Surgery | Admitting: Physical Therapy

## 2022-07-23 ENCOUNTER — Encounter: Payer: Self-pay | Admitting: Physical Therapy

## 2022-07-23 DIAGNOSIS — M5459 Other low back pain: Secondary | ICD-10-CM | POA: Insufficient documentation

## 2022-07-23 DIAGNOSIS — M25511 Pain in right shoulder: Secondary | ICD-10-CM | POA: Insufficient documentation

## 2022-07-23 DIAGNOSIS — S42201D Unspecified fracture of upper end of right humerus, subsequent encounter for fracture with routine healing: Secondary | ICD-10-CM | POA: Diagnosis not present

## 2022-07-23 DIAGNOSIS — M6281 Muscle weakness (generalized): Secondary | ICD-10-CM | POA: Diagnosis not present

## 2022-07-23 NOTE — Therapy (Signed)
OUTPATIENT PHYSICAL THERAPY SHOULDER TREATMENT/ DISCHARGE  Physical Therapy Progress Note  Dates of reporting period  06/09/22  to  07/23/22   Patient Name: Jennifer Morrow MRN: [id number] DOB:04/19/1951, 71 y.o., female Today's Date: 07/23/22   PT End of Session - 07/23/22 0730     Visit Number 30    Number of Visits 41    Date for PT Re-Evaluation 08/24/22    PT Start Time 0731    Activity Tolerance Patient tolerated treatment well;Patient limited by pain    Behavior During Therapy [facility] for tasks assessed/performed            0731 to 0817  (46 minutes).     Past Medical History:  Diagnosis Date   Arthritis    Bronchitis    CHRONIC   Bulging lumbar disc    degenerative dics   Scoliosis    LIMITS LUNG FUNCTION   Spinal stenosis    Past Surgical History:  Procedure Laterality Date   CATARACT EXTRACTION W/PHACO Left 01/06/2018   Procedure: CATARACT EXTRACTION PHACO AND INTRAOCULAR LENS PLACEMENT ([place]);  Surgeon: Eulogio Bear, MD;  Location: [facility] ORS;  Service: Ophthalmology;  Laterality: Left;  [place] 00:27.5 AP% 4.3 CDE 1.18 FLUID PACK LOT # [id number]   CATARACT EXTRACTION W/PHACO Right 02/10/2018   Procedure: CATARACT EXTRACTION PHACO AND INTRAOCULAR LENS PLACEMENT ([facility]);  Surgeon: Eulogio Bear, MD;  Location: [facility] ORS;  Service: Ophthalmology;  Laterality: Right;  [place] 00:30.5 AP% 5.0 CDE 1.51  Fluid pack lot # [id number]   FOOT X 2     HEEL SPUR EXCISION     TONSILLECTOMY     Patient Active Problem List   Diagnosis Date Noted   Osteoarthritis cervical spine 01/14/2022   Age-related osteoporosis without current pathological fracture 05/19/2021   Spondylosis of lumbar region without myelopathy or radiculopathy 05/19/2021   Degenerative disc disease, lumbar 05/19/2021   Scoliosis, unspecified 01/09/2015   Vitamin D deficiency 01/08/2014   Mixed hyperlipidemia 01/08/2014    PCP: Halina Maidens MD  REFERRING PROVIDER: Renee Harder MD  REFERRING  DIAG: Closed fracture of proximal right humerus   THERAPY DIAG:  Acute pain of right shoulder  Muscle weakness (generalized)  Closed fracture of proximal end of right humerus with routine healing, unspecified fracture morphology, subsequent encounter  Other low back pain  Rationale for Evaluation and Treatment Rehabilitation  ONSET DATE: 01/30/22  SUBJECTIVE:                                                                                                                                                                                      SUBJECTIVE STATEMENT:  EVALUATION Pt is  s/p R proximal humerus fracture on 01/30/22 for conservative management. Pt has been doing her HEP, and is able to roll briefly onto her L shoulder while sleeping. 0/10 pain prior to tx. Pt has pain while dressing herself, and is not able to fully independently don/doff a shirt yet. Pt stated that she has pain in her R low back, with numbness down her LLE and L low back but is chronic pain she has dealt with for years. Pt currently sleeps in adjustable bed, with the ability to raise the head/feet of the bed. Has been compliant with HEP reporting shoulder is moving better with less pain with mobility.   PERTINENT HISTORY: Pt presenting with R shoulder pain s/p non operative proximal humerus fracture. Pt reports having a tent fall on her leading her to fall to the ground on her R side. Pt here with non-operative PT protocol. Reports orthopedic MD planning for 6-8 weeks of donning sling. Pt has history of R sided RTC tears and orthopedic MD stating return to full AROM in RUE may not be feasible. No MRI for RTC tissue quality as pt reports MD wants to see how pt manages with AROM with fracture recovery. Pt is R handed. Currently receiving HH aide for 4 hours in the morning to assist ADL's primarily dressing. No longer requiring pain meds except Tylenol PRN. Pain vastly has improved, worst pain nowadays is a 4/10 NPS sporadically.  Has been working on elbow extension in recliner, some numbness in hand at night palmar and dorsal. Rated as moderate, unsure of time of numbness.   PAIN:  Are you having pain? Yes: NPRS scale: 0/10 Pain location: R shoulder Pain description: Dull/achey Aggravating factors: RUE movement Relieving factors: Rest  PRECAUTIONS: See protocol in chart  WEIGHT BEARING RESTRICTIONS Yes WBAT on RUE  FALLS:  Has patient fallen in last 6 months? No  LIVING ENVIRONMENT: Lives with: lives with their family and lives alone Lives in: House/apartment Stairs: Yes: External: 3 steps; on right going up Has following equipment at home: None  OCCUPATION: Retired  PLOF: [place] motion/strength. Return to full independence.    OBJECTIVE:   DIAGNOSTIC FINDINGS:  XR Shoulder 3 Or More Views Right  Final Result   Acute, comminuted and anteriorly displaced fracture of the right proximal humerus with involvement of the surgical and anatomic necks and fragmentation of the greater tuberosity. Large fracture fragment containing the majority of the glenoid articular surface is rotated laterally.   PATIENT SURVEYS:  FOTO 19 with target of 75  COGNITION: Overall cognitive status: Within functional limits for tasks assessed     SENSATION: WFL  POSTURE: Upper trap activation most likely due to wearing sling. Overall WNL.    CERVICAL AROM:  Flexion: 50 degrees Extension: 45 degrees Rotation R/L: 50/45 degrees Lateral flexion (R/L): 30/35 degrees   UPPER EXTREMITY ROM:   Active ROM Right eval Left eval  Shoulder flexion PROM 74 Full  Shoulder extension    Shoulder abduction PROM 78 Full  Shoulder adduction    Shoulder internal rotation Deferred til week 6 per protocol Full  Shoulder external rotation 16 Full  Elbow flexion 129 Full  Elbow extension 30 from full ext 0 degrees   UPPER EXTREMITY MMT:    RUE deferred due to NWB status and in sling MMT  Right eval Left eval  Shoulder flexion  5/5  Shoulder extension    Shoulder abduction  5/5  Shoulder adduction    Shoulder  internal rotation  5/5  Shoulder external rotation  5/5  Middle trapezius    Lower trapezius    Elbow flexion  5/5  Elbow extension  5/5  Wrist flexion  5/5  Wrist extension  5/5  Wrist ulnar deviation    Wrist radial deviation    Wrist pronation    Wrist supination    Grip strength (lbs) 30.1 lbs (avg 2 trials) 36.5 lbs (avg 2 trials)  (Blank rows = not tested)   PALPATION: TTP along proximal humerus of RUE.     04/02/22:  R shoulder A/PROM in supine: flexion (94 deg.), abduction (92 deg.), ER (33 deg.), IR (78 deg.).  Supine R shoulder PROM flexion 124 deg. (Pain).  Standing R shoulder AROM: flexion (85 deg.), abduction (82 deg.).    04/17/22:  Yellow bulb grasping on R: 2 psi.  Handheld dynamometer: L 56.2#/ R 35.7#.    05/07/22:  Grip strength: L 61#/ R 42# (marked improvement).   Lumbar screen:   Lumbar rotn. (Tight in supine)/ SLR 60 deg./ pain with bridging).    B LE strength grossly 5/5 MMT except hip flexion/ abduction 4/5 MMT.     TODAY'S TREATMENT (07/23/22)  SUBJECTIVE:   Pt. Remains busy with daily activity.  Pt. Wants to make today last PT appt. Due to upcoming trip to [place].  Pt. Will continue with core and UE ex. Program.       PAIN: R shoulder 0/10 and 2/10 R elbow prior to tx. Session.  Pt. Reports 1/10 LBP in morning and >5/10 in afternoon.    Ther-ex.:   Nustep B UE/LE L4 for 10 min. (Consistent cadence).  Discussed MRI results.    Seated shoulder pulley: flexion/ abduction 20x.  Reassessment of shoulder ROM (>130 deg. Flexion/ abduction in pain tolerable range).     Nautilus (with handles): seated lat. Pull downs 50#/ standing tricep extension 30#/ standing shoulder extension 30#/ standing scap. Retraction 40#/ chest press 20#/ shoulder adduction with handles 20# 20x each.    Focus on maintaining R elbow extension.    Seated/  supine R shoulder AA/PROM (all planes)- with holds as tolerated.  Focus on IR/ER.    Reassessment of goals.      Pt. Will ice at home (as needed).       PATIENT EDUCATION: Education details: form/technique with exercise and updated HEP.  Access Code: [email or web address] Person educated: Patient Education method: Explanation, Demonstration, Tactile cues, Verbal cues, and Handouts Education comprehension: verbalized understanding, returned demonstration, and needs further education   HOME EXERCISE PROGRAM: Access Code: [email or web address]  Access Code: [email or web address] URL: [email or web address] Date: 03/12/2022 Prepared by: Dorcas Carrow   Exercises - Seated Shoulder Flexion AAROM with Pulley Behind  - 2 x daily - 7 x weekly - 2 sets - 10 reps - Seated Shoulder Scaption AAROM with Pulley at Side  - 2 x daily - 7 x weekly - 2 sets - 10 reps - Supine Shoulder Press with Dowel  - 1 x daily - 7 x weekly - 3 sets - 10 reps   Access Code: [email or web address] URL: [email or web address] Date: 05/07/2022 Prepared by: Dorcas Carrow  Exercises - Seated Shoulder Flexion AAROM with Pulley Behind  - 2 x daily - 7 x weekly - 2 sets - 10 reps - Seated Shoulder Scaption AAROM with Pulley at Side  - 2 x daily - 7 x weekly - 2 sets - 10 reps - Standing Shoulder Extension with Dowel  - 1 x daily - 7 x  weekly - 2 sets - 10 reps - Standing Bilateral Shoulder Internal Rotation AAROM with Dowel  - 1 x daily - 7 x weekly - 2 sets - 10 reps - Standing Shoulder Flexion AAROM with Dowel  - 1 x daily - 7 x weekly - 2 sets - 10 reps - Supine Shoulder Press with Dowel  - 1 x daily - 7 x weekly - 2 sets - 10 reps - Scapular Retraction with Resistance  - 1 x daily - 5 x weekly - 1 sets - 20 reps - Shoulder extension with resistance - Neutral  - 1 x daily - 5 x weekly - 1 sets - 20 reps - Standing Shoulder Flexion with Resistance  - 1 x daily - 5 x weekly - 1 sets - 20 reps - Standing Single Arm Elbow Flexion with  Resistance  - 1 x daily - 5 x weekly - 1 sets - 20 reps   Access Code: [email or web address] URL: [email or web address] Date: 07/04/2022 Prepared by: Dorcas Carrow  Exercises - Hooklying Transversus Abdominis Palpation  - 1 x daily - 7 x weekly - 2 sets - 10 reps - 3 hold - Supine Posterior Pelvic Tilt  - 1 x daily - 7 x weekly - 2 sets - 10 reps - 3 hold - Supine Transversus Abdominis Bracing with Heel Slide  - 1 x daily - 7 x weekly - 2 sets - 10 reps - Supine March  - 1 x daily - 7 x weekly - 2 sets - 10 reps - Supine Lower Trunk Rotation  - 1 x daily - 7 x weekly - 1 sets - 3 reps - 2 hold - Hooklying Single Knee to Chest Stretch  - 1 x daily - 7 x weekly - 1 sets - 3 reps   ASSESSMENT:  CLINICAL IMPRESSION:  Pt. Remains limited with daily activity/ functional mobility due to chronic back pain.  Pt. Continues to benefit from UE strengthening/ shoulder stretches to improve functional mobility.  Pt. Will continue with core ex./ use of R UE with daily tasks and HEP.   Pt. Has trip to [facility] for >5 weeks coming up soon and will be done with skilled PT services at this time.  Pt. Instructed to contact PT if any questions or concerns.      OBJECTIVE IMPAIRMENTS decreased mobility, decreased ROM, decreased strength, hypomobility, impaired flexibility, improper body mechanics, postural dysfunction, and pain.   ACTIVITY LIMITATIONS carrying, lifting, bathing, toileting, dressing, reach over head, and hygiene/grooming  PARTICIPATION LIMITATIONS: cleaning, driving, shopping, community activity, occupation, and yard work  PERSONAL FACTORS Age, Behavior pattern, Past/current experiences, Time since onset of injury/illness/exacerbation, Transportation, and 3+ comorbidities: HLD, lumbar DDD, OA of cervical spine, scoliosis  are also affecting patient's functional outcome.   REHAB POTENTIAL: Fair History of R sided RTC tears  CLINICAL DECISION MAKING: Evolving/moderate complexity  EVALUATION  COMPLEXITY: Moderate   GOALS: Goals reviewed with patient? Yes  SHORT TERM GOALS:  07/27/22  Pt will be independent with HEP to improve R shoulder mobility and strength for ADL completion Baseline: Initiated and given.  9/21: see updated HEP with RTB (good technique) Goal status: Goal met  2.  Pt will improve R shoulder PROM to 90 degrees per protocol in prep for progression of AAROM of R shoulder. Baseline: 74 degrees.  R shoulder 95 deg.  9/21: 98 deg. AROM (pain) Goal status: Goal met   3.  Pt will improve R elbow extension to 0 degrees to prevent  elbow contracture for ease of RUE ADL completion.  Baseline: Lacking 30 degrees from terminal extension.  9/21: -2 deg. extension Goal status: Goal met    4.  Pt. Will be independent with core stability ex. Program to improve standing tolerance/ completion of daily household tasks.      Baseline:  pt. Currently not participating with core ex.  12/7: understands HEP    Goal status: Goal met  LONG TERM GOALS: Target date: 08/24/22  Pt will display full AROM as compared to L shoulder to demonstrate full joint mobility/ overhead reaching.  Baseline: Limited in flexion, ER, Abduction Goal status: Not met Target Date: 08/24/2022  2.  Pt will improve FOTO to target score to demonstrate clinically significant improvement in functional mobility of RUE Baseline: 19 with target score of 55.  9/21: 60 (marked improvement)- limited with overhead reaching/ heavy household chores.  12/7: 63  (limited with heavy lifting/ overhead tasks).   Goal status: Partially met  3.  Pt will demonstrate at least 3/5 strength in RUE in flexion and abduction for overhead ADL completion Baseline: unable to test currently.  9/21:  R shoulder flexion 3-/5 (pain), bicep 4/5, tricep 4+/5, IR 4/5, ER 3-/5, extension 4+/5 MMT Goal status: Not met  4.  Pt will demonstrate > 120 degrees of RUE shoulder flexion to demonstrate adequate mobility to complete needed overhead ADL  tasks  Baseline: PROM of 74 degrees flexion, 78 PROM abduction.  9/21: 98 deg. AROM in seated posture.   Goal status:  Not met    PLAN: PT FREQUENCY: 2x/week  PT DURATION: 8 weeks  PLANNED INTERVENTIONS: Therapeutic exercises, Therapeutic activity, Neuromuscular re-education, Patient/Family education, Self Care, Joint mobilization, Dry Needling, Electrical stimulation, Spinal mobilization, Cryotherapy, Moist heat, and Manual therapy  PLAN FOR NEXT SESSION:   Discharge with focus on HEP.  Pt. Instructed to contact PT with any questions while in [place].     Pura Spice, PT, DPT # [phone] Physical Therapist- [facility]  07/23/2022, 7:45 AM

## 2022-07-28 ENCOUNTER — Encounter: Payer: Medicare HMO | Admitting: Physical Therapy

## 2022-08-04 ENCOUNTER — Encounter: Payer: Medicare HMO | Admitting: Physical Therapy

## 2022-08-25 ENCOUNTER — Telehealth: Payer: Medicare HMO | Admitting: Internal Medicine

## 2022-09-09 ENCOUNTER — Ambulatory Visit (INDEPENDENT_AMBULATORY_CARE_PROVIDER_SITE_OTHER): Payer: Medicare HMO | Admitting: Internal Medicine

## 2022-09-09 ENCOUNTER — Encounter: Payer: Self-pay | Admitting: Internal Medicine

## 2022-09-09 VITALS — BP 112/70 | HR 81 | Temp 98.0°F | Ht 67.0 in | Wt 196.2 lb

## 2022-09-09 DIAGNOSIS — J0100 Acute maxillary sinusitis, unspecified: Secondary | ICD-10-CM | POA: Diagnosis not present

## 2022-09-09 MED ORDER — AZITHROMYCIN 250 MG PO TABS
ORAL_TABLET | ORAL | 0 refills | Status: AC
Start: 2022-09-09 — End: 2022-09-14

## 2022-09-09 NOTE — Progress Notes (Signed)
Date:  09/09/2022   Name:  Jennifer Morrow   DOB:  November 20, 1950   MRN:  [id number]   Chief Complaint: URI  URI  This is a new problem. The current episode started 1 to 4 weeks ago. The problem has been waxing and waning. There has been no fever. Associated symptoms include congestion, coughing, headaches and sinus pain. Pertinent negatives include no chest pain, diarrhea, ear pain, sore throat or wheezing.    Lab Results  Component Value Date   NA 142 09/23/2021   K 4.9 09/23/2021   CO2 23 09/23/2021   GLUCOSE 96 09/23/2021   BUN 24 09/23/2021   CREATININE 0.75 09/23/2021   CALCIUM 9.3 09/23/2021   EGFR 86 09/23/2021   Lab Results  Component Value Date   CHOL 222 (H) 09/23/2021   HDL 72 09/23/2021   LDLCALC 135 (H) 09/23/2021   TRIG 85 09/23/2021   CHOLHDL 3.1 09/23/2021   Lab Results  Component Value Date   TSH 2.540 09/23/2021   No results found for: "HGBA1C" Lab Results  Component Value Date   WBC 4.0 09/23/2021   HGB 15.3 09/23/2021   HCT 44.2 09/23/2021   MCV 90 09/23/2021   PLT 291 09/23/2021   Lab Results  Component Value Date   ALT 60 (H) 09/23/2021   AST 30 09/23/2021   ALKPHOS 58 09/23/2021   BILITOT 0.4 09/23/2021   Lab Results  Component Value Date   VD25OH 42.6 05/23/2021     Review of Systems  Constitutional:  Positive for fatigue. Negative for chills and fever.  HENT:  Positive for congestion and sinus pain. Negative for ear pain and sore throat.   Respiratory:  Positive for cough. Negative for chest tightness, shortness of breath and wheezing.   Cardiovascular:  Negative for chest pain and palpitations.  Gastrointestinal:  Negative for diarrhea.  Neurological:  Positive for headaches. Negative for dizziness.  Psychiatric/Behavioral:  Negative for dysphoric mood and sleep disturbance. The patient is not nervous/anxious.     Patient Active Problem List   Diagnosis Date Noted   Osteoarthritis cervical spine 01/14/2022   Age-related  osteoporosis without current pathological fracture 05/19/2021   Spondylosis of lumbar region without myelopathy or radiculopathy 05/19/2021   Degenerative disc disease, lumbar 05/19/2021   Scoliosis, unspecified 01/09/2015   Vitamin D deficiency 01/08/2014   Mixed hyperlipidemia 01/08/2014    Allergies  Allergen Reactions   Celecoxib Other (See Comments)    Elevated LFT's   Oxaprozin Other (See Comments)    Elevated LFT's   Terbinafine Other (See Comments)    Elevated LFT's    Past Surgical History:  Procedure Laterality Date   CATARACT EXTRACTION W/PHACO Left 01/06/2018   Procedure: CATARACT EXTRACTION PHACO AND INTRAOCULAR LENS PLACEMENT ([facility]);  Surgeon: Nevada Crane, MD;  Location: [facility] ORS;  Service: Ophthalmology;  Laterality: Left;  [place] 00:27.5 AP% 4.3 CDE 1.18 FLUID PACK LOT # [id number]   CATARACT EXTRACTION W/PHACO Right 02/10/2018   Procedure: CATARACT EXTRACTION PHACO AND INTRAOCULAR LENS PLACEMENT ([facility]);  Surgeon: Nevada Crane, MD;  Location: [facility] ORS;  Service: Ophthalmology;  Laterality: Right;  [place] 00:30.5 AP% 5.0 CDE 1.51  Fluid pack lot # [id number] H   FOOT X 2     HEEL SPUR EXCISION     TONSILLECTOMY      Social History   Tobacco Use   Smoking status: Never   Smokeless tobacco: Never  Vaping Use   Vaping Use: Never used  Substance  Use Topics   Alcohol use: Yes    Comment: OCCAS   Drug use: Never     Medication list has been reviewed and updated.  Current Meds  Medication Sig   alendronate (FOSAMAX) 70 MG tablet Take 70 mg by mouth once a week.   azithromycin (ZITHROMAX Z-PAK) 250 MG tablet UAD   Calcium-Vitamin D-Vitamin K 650-12.5-40 MG-MCG-MCG CHEW    cetirizine (ZYRTEC) 10 MG tablet Take 10 mg by mouth daily as needed for allergies.   fluticasone (FLONASE) 50 MCG/ACT nasal spray Place into the nose.   gabapentin (NEURONTIN) 300 MG capsule Take 1 capsule by mouth 3 (three) times daily.   HYDROcodone-acetaminophen (NORCO/VICODIN) 5-325  MG tablet Take by mouth as needed. Pt taking 5 times per day   meloxicam (MOBIC) 15 MG tablet Take 15 mg by mouth as needed for pain.   tiZANidine (ZANAFLEX) 2 MG tablet Take 2 mg by mouth daily as needed for muscle pain.   Triamcinolone Acetonide (NASACORT ALLERGY 24HR NA) Place 2 sprays into the nose daily as needed (allergies).   Vitamin D, Ergocalciferol, (DRISDOL) 1.25 MG (50000 UNIT) CAPS capsule Take 50,000 Units by mouth every 7 (seven) days.       09/09/2022    9:10 AM 09/23/2021    8:43 AM 05/20/2021   11:12 AM  GAD 7 : Generalized Anxiety Score  Nervous, Anxious, on Edge 0 0 0  Control/stop worrying 0 0 0  Worry too much - different things 0 0 0  Trouble relaxing 0 0 0  Restless 0 0 0  Easily annoyed or irritable 0 0 0  Afraid - awful might happen 0 0 0  Total GAD 7 Score 0 0 0  Anxiety Difficulty Not difficult at all Not difficult at all        09/09/2022    9:10 AM 01/14/2022   11:25 AM 09/23/2021    8:42 AM  Depression screen PHQ 2/9  Decreased Interest 0 0 0  Down, Depressed, Hopeless 0 0 0  PHQ - 2 Score 0 0 0  Altered sleeping 0  1  Tired, decreased energy 3  2  Change in appetite 2  0  Feeling bad or failure about yourself  0  0  Trouble concentrating 1  0  Moving slowly or fidgety/restless 0  0  Suicidal thoughts 0  0  PHQ-9 Score 6  3  Difficult doing work/chores Not difficult at all  Not difficult at all    BP Readings from Last 3 Encounters:  09/09/22 112/70  09/23/21 104/68  05/24/21 (!) 134/54    Physical Exam Constitutional:      Appearance: She is well-developed.  HENT:     Right Ear: Ear canal and external ear normal. Tympanic membrane is not erythematous or retracted.     Left Ear: Ear canal and external ear normal. Tympanic membrane is not erythematous or retracted.     Nose:     Right Sinus: Maxillary sinus tenderness present. No frontal sinus tenderness.     Left Sinus: Maxillary sinus tenderness present. No frontal sinus tenderness.      Mouth/Throat:     Mouth: No oral lesions.     Pharynx: Uvula midline.  Cardiovascular:     Rate and Rhythm: Normal rate and regular rhythm.     Heart sounds: Normal heart sounds.  Pulmonary:     Breath sounds: Normal breath sounds. No wheezing or rales.  Lymphadenopathy:     Cervical: No cervical  adenopathy.  Skin:    General: Skin is warm.  Neurological:     Mental Status: She is alert and oriented to person, place, and time.     Wt Readings from Last 3 Encounters:  09/09/22 196 lb 3.2 oz (89 kg)  09/23/21 194 lb 9.6 oz (88.3 kg)  05/24/21 188 lb (85.3 kg)    BP 112/70   Pulse 81   Temp 98 F (36.7 C) (Oral)   Ht 5\' 7"  (1.702 m)   Wt 196 lb 3.2 oz (89 kg)   SpO2 98%   BMI 30.73 kg/m   Assessment and Plan: Problem List Items Addressed This Visit   None Visit Diagnoses     Acute non-recurrent maxillary sinusitis    -  Primary   continue otc sinus/cold meds push fluids return if no improvement   Relevant Medications   azithromycin (ZITHROMAX Z-PAK) 250 MG tablet        Partially dictated using Editor, commissioning. Any errors are unintentional.  Halina Maidens, MD [facility]  09/09/2022

## 2022-09-10 DIAGNOSIS — M8588 Other specified disorders of bone density and structure, other site: Secondary | ICD-10-CM | POA: Diagnosis not present

## 2022-09-14 DIAGNOSIS — M5136 Other intervertebral disc degeneration, lumbar region: Secondary | ICD-10-CM | POA: Diagnosis not present

## 2022-09-14 DIAGNOSIS — Z79899 Other long term (current) drug therapy: Secondary | ICD-10-CM | POA: Diagnosis not present

## 2022-09-14 DIAGNOSIS — M545 Low back pain, unspecified: Secondary | ICD-10-CM | POA: Diagnosis not present

## 2022-09-14 DIAGNOSIS — M25851 Other specified joint disorders, right hip: Secondary | ICD-10-CM | POA: Diagnosis not present

## 2022-09-14 DIAGNOSIS — M47814 Spondylosis without myelopathy or radiculopathy, thoracic region: Secondary | ICD-10-CM | POA: Diagnosis not present

## 2022-09-14 DIAGNOSIS — M25852 Other specified joint disorders, left hip: Secondary | ICD-10-CM | POA: Diagnosis not present

## 2022-09-14 DIAGNOSIS — M5030 Other cervical disc degeneration, unspecified cervical region: Secondary | ICD-10-CM | POA: Diagnosis not present

## 2022-09-14 DIAGNOSIS — M4185 Other forms of scoliosis, thoracolumbar region: Secondary | ICD-10-CM | POA: Diagnosis not present

## 2022-09-14 DIAGNOSIS — M419 Scoliosis, unspecified: Secondary | ICD-10-CM | POA: Diagnosis not present

## 2022-09-14 DIAGNOSIS — G8929 Other chronic pain: Secondary | ICD-10-CM | POA: Diagnosis not present

## 2022-09-14 DIAGNOSIS — M5442 Lumbago with sciatica, left side: Secondary | ICD-10-CM | POA: Diagnosis not present

## 2022-09-14 DIAGNOSIS — M48061 Spinal stenosis, lumbar region without neurogenic claudication: Secondary | ICD-10-CM | POA: Diagnosis not present

## 2022-09-14 DIAGNOSIS — M5134 Other intervertebral disc degeneration, thoracic region: Secondary | ICD-10-CM | POA: Diagnosis not present

## 2022-09-21 ENCOUNTER — Encounter: Payer: Self-pay | Admitting: Internal Medicine

## 2022-09-21 ENCOUNTER — Other Ambulatory Visit: Payer: Self-pay | Admitting: Internal Medicine

## 2022-09-21 ENCOUNTER — Ambulatory Visit (INDEPENDENT_AMBULATORY_CARE_PROVIDER_SITE_OTHER): Payer: Medicare HMO | Admitting: Internal Medicine

## 2022-09-21 VITALS — BP 112/74 | HR 70 | Temp 98.4°F | Ht 67.0 in | Wt 192.0 lb

## 2022-09-21 DIAGNOSIS — R6889 Other general symptoms and signs: Secondary | ICD-10-CM

## 2022-09-21 DIAGNOSIS — U071 COVID-19: Secondary | ICD-10-CM

## 2022-09-21 DIAGNOSIS — R059 Cough, unspecified: Secondary | ICD-10-CM | POA: Diagnosis not present

## 2022-09-21 DIAGNOSIS — R509 Fever, unspecified: Secondary | ICD-10-CM | POA: Diagnosis not present

## 2022-09-21 LAB — POC COVID19 BINAXNOW: SARS Coronavirus 2 Ag: POSITIVE — AB

## 2022-09-21 MED ORDER — NIRMATRELVIR/RITONAVIR (PAXLOVID)TABLET: 3.0000 | ORAL_TABLET | Freq: Two times a day (BID) | ORAL | 0 refills | Status: AC

## 2022-09-21 NOTE — Patient Instructions (Signed)
Take Tylenol 650 - 1000 mg every 6-8 hours for fever, body aches and headache. Drink plenty of fluids with electrolytes. Monitor for fever that does not decrease and/or shortness of breath that worsens or is present at rest. Quarantine for 5 days.

## 2022-09-21 NOTE — Progress Notes (Signed)
Date:  09/21/2022   Name:  Jennifer Morrow   DOB:  September 14, 1950   MRN:  [id number]   Chief Complaint: Cough (Headache, cough, congestion, sinus pressure, malaise, and felt feverish. )  Cough This is a new problem. Episode onset: past three days. The problem has been gradually worsening. The problem occurs every few minutes. The cough is Non-productive. Associated symptoms include chills, a fever, headaches, myalgias and nasal congestion. Pertinent negatives include no chest pain, sore throat or wheezing.    Lab Results  Component Value Date   NA 142 09/23/2021   K 4.9 09/23/2021   CO2 23 09/23/2021   GLUCOSE 96 09/23/2021   BUN 24 09/23/2021   CREATININE 0.75 09/23/2021   CALCIUM 9.3 09/23/2021   EGFR 86 09/23/2021   Lab Results  Component Value Date   CHOL 222 (H) 09/23/2021   HDL 72 09/23/2021   LDLCALC 135 (H) 09/23/2021   TRIG 85 09/23/2021   CHOLHDL 3.1 09/23/2021   Lab Results  Component Value Date   TSH 2.540 09/23/2021   No results found for: "HGBA1C" Lab Results  Component Value Date   WBC 4.0 09/23/2021   HGB 15.3 09/23/2021   HCT 44.2 09/23/2021   MCV 90 09/23/2021   PLT 291 09/23/2021   Lab Results  Component Value Date   ALT 60 (H) 09/23/2021   AST 30 09/23/2021   ALKPHOS 58 09/23/2021   BILITOT 0.4 09/23/2021   Lab Results  Component Value Date   VD25OH 42.6 05/23/2021     Review of Systems  Constitutional:  Positive for appetite change, chills and fever.  HENT:  Positive for congestion and sinus pressure. Negative for sore throat and trouble swallowing.   Eyes:  Negative for visual disturbance.  Respiratory:  Positive for cough. Negative for chest tightness and wheezing.   Cardiovascular:  Negative for chest pain and palpitations.  Gastrointestinal:  Positive for vomiting (one episode).  Musculoskeletal:  Positive for back pain and myalgias.  Neurological:  Positive for headaches. Negative for dizziness, light-headedness and numbness.   Psychiatric/Behavioral:  Positive for sleep disturbance. Negative for dysphoric mood. The patient is nervous/anxious.     Patient Active Problem List   Diagnosis Date Noted   Osteoarthritis cervical spine 01/14/2022   Age-related osteoporosis without current pathological fracture 05/19/2021   Spondylosis of lumbar region without myelopathy or radiculopathy 05/19/2021   Degenerative disc disease, lumbar 05/19/2021   Scoliosis, unspecified 01/09/2015   Vitamin D deficiency 01/08/2014   Mixed hyperlipidemia 01/08/2014    Allergies  Allergen Reactions   Celecoxib Other (See Comments)    Elevated LFT's   Oxaprozin Other (See Comments)    Elevated LFT's   Terbinafine Other (See Comments)    Elevated LFT's    Past Surgical History:  Procedure Laterality Date   CATARACT EXTRACTION W/PHACO Left 01/06/2018   Procedure: CATARACT EXTRACTION PHACO AND INTRAOCULAR LENS PLACEMENT ([place]);  Surgeon: Eulogio Bear, MD;  Location: [facility];  Service: Ophthalmology;  Laterality: Left;  [place] 00:27.5 AP% 4.3 CDE 1.18 FLUID PACK LOT # [id number]   CATARACT EXTRACTION W/PHACO Right 02/10/2018   Procedure: CATARACT EXTRACTION PHACO AND INTRAOCULAR LENS PLACEMENT ([facility]);  Surgeon: Eulogio Bear, MD;  Location: [facility];  Service: Ophthalmology;  Laterality: Right;  [place] 00:30.5 AP% 5.0 CDE 1.51  Fluid pack lot # [id number]   FOOT X 2     HEEL SPUR EXCISION     TONSILLECTOMY      Social History  Tobacco Use   Smoking status: Never   Smokeless tobacco: Never  Vaping Use   Vaping Use: Never used  Substance Use Topics   Alcohol use: Yes    Comment: OCCAS   Drug use: Never     Medication list has been reviewed and updated.  Current Meds  Medication Sig   alendronate (FOSAMAX) 70 MG tablet Take 70 mg by mouth once a week.   Calcium-Vitamin D-Vitamin K 650-12.5-40 MG-MCG-MCG CHEW    cetirizine (ZYRTEC) 10 MG tablet Take 10 mg by mouth daily as needed for allergies.   fluticasone  (FLONASE) 50 MCG/ACT nasal spray Place into the nose.   gabapentin (NEURONTIN) 300 MG capsule Take 1 capsule by mouth 3 (three) times daily.   HYDROcodone-acetaminophen (NORCO/VICODIN) 5-325 MG tablet Take by mouth as needed. Pt taking 5 times per day   meloxicam (MOBIC) 15 MG tablet Take 15 mg by mouth as needed for pain.   nirmatrelvir/ritonavir (PAXLOVID) 20 x 150 MG & 10 x 100MG  TABS Take 3 tablets by mouth 2 (two) times daily for 5 days. (Take nirmatrelvir 150 mg two tablets twice daily for 5 days and ritonavir 100 mg one tablet twice daily for 5 days) Patient GFR is 86   OVER THE COUNTER MEDICATION CBD roll on ointment PRN   tiZANidine (ZANAFLEX) 2 MG tablet Take 2 mg by mouth daily as needed for muscle pain.   Triamcinolone Acetonide (NASACORT ALLERGY 24HR NA) Place 2 sprays into the nose daily as needed (allergies).   Vitamin D, Ergocalciferol, (DRISDOL) 1.25 MG (50000 UNIT) CAPS capsule Take 50,000 Units by mouth every 7 (seven) days.       09/09/2022    9:10 AM 09/23/2021    8:43 AM 05/20/2021   11:12 AM  GAD 7 : Generalized Anxiety Score  Nervous, Anxious, on Edge 0 0 0  Control/stop worrying 0 0 0  Worry too much - different things 0 0 0  Trouble relaxing 0 0 0  Restless 0 0 0  Easily annoyed or irritable 0 0 0  Afraid - awful might happen 0 0 0  Total GAD 7 Score 0 0 0  Anxiety Difficulty Not difficult at all Not difficult at all        09/09/2022    9:10 AM 01/14/2022   11:25 AM 09/23/2021    8:42 AM  Depression screen PHQ 2/9  Decreased Interest 0 0 0  Down, Depressed, Hopeless 0 0 0  PHQ - 2 Score 0 0 0  Altered sleeping 0  1  Tired, decreased energy 3  2  Change in appetite 2  0  Feeling bad or failure about yourself  0  0  Trouble concentrating 1  0  Moving slowly or fidgety/restless 0  0  Suicidal thoughts 0  0  PHQ-9 Score 6  3  Difficult doing work/chores Not difficult at all  Not difficult at all    BP Readings from Last 3 Encounters:  09/21/22 112/74   09/09/22 112/70  09/23/21 104/68    Physical Exam Vitals and nursing note reviewed.  Constitutional:      General: She is not in acute distress.    Appearance: Normal appearance. She is well-developed.  HENT:     Head: Normocephalic and atraumatic.  Cardiovascular:     Rate and Rhythm: Normal rate and regular rhythm.  Pulmonary:     Effort: Pulmonary effort is normal. No respiratory distress.     Breath sounds: No wheezing or  rhonchi.  Musculoskeletal:     Cervical back: Normal range of motion.     Right lower leg: No edema.     Left lower leg: No edema.  Lymphadenopathy:     Cervical: No cervical adenopathy.  Skin:    General: Skin is warm and dry.     Findings: No rash.  Neurological:     Mental Status: She is alert and oriented to person, place, and time.  Psychiatric:        Mood and Affect: Mood normal.        Behavior: Behavior normal.     Wt Readings from Last 3 Encounters:  09/21/22 192 lb (87.1 kg)  09/09/22 196 lb 3.2 oz (89 kg)  09/23/21 194 lb 9.6 oz (88.3 kg)    BP 112/74   Pulse 70   Temp 98.4 F (36.9 C) (Oral)   Ht 5\' 7"  (1.702 m)   Wt 192 lb (87.1 kg)   SpO2 96%   BMI 30.07 kg/m   Assessment and Plan: Problem List Items Addressed This Visit   None Visit Diagnoses     COVID-19 virus infection    -  Primary   Continue fluids, rest, otc cough syrup call for Rx cough syrup if needed   Relevant Medications   nirmatrelvir/ritonavir (PAXLOVID) 20 x 150 MG & 10 x 100MG  TABS   Flu-like symptoms       Relevant Orders   POC COVID-19 BinaxNow (Completed)        Partially dictated using Editor, commissioning. Any errors are unintentional.  Halina Maidens, MD [facility]  09/21/2022

## 2022-09-21 NOTE — Telephone Encounter (Signed)
Pt informed and verbalized understanding.  KP 

## 2022-09-29 ENCOUNTER — Ambulatory Visit (INDEPENDENT_AMBULATORY_CARE_PROVIDER_SITE_OTHER): Payer: Medicare HMO | Admitting: Internal Medicine

## 2022-09-29 ENCOUNTER — Encounter: Payer: Self-pay | Admitting: Internal Medicine

## 2022-09-29 VITALS — BP 122/78 | HR 68 | Ht 67.0 in | Wt 193.8 lb

## 2022-09-29 DIAGNOSIS — Z791 Long term (current) use of non-steroidal anti-inflammatories (NSAID): Secondary | ICD-10-CM | POA: Diagnosis not present

## 2022-09-29 DIAGNOSIS — E782 Mixed hyperlipidemia: Secondary | ICD-10-CM

## 2022-09-29 DIAGNOSIS — Z0000 Encounter for general adult medical examination without abnormal findings: Secondary | ICD-10-CM | POA: Diagnosis not present

## 2022-09-29 DIAGNOSIS — R7989 Other specified abnormal findings of blood chemistry: Secondary | ICD-10-CM

## 2022-09-29 DIAGNOSIS — M810 Age-related osteoporosis without current pathological fracture: Secondary | ICD-10-CM

## 2022-09-29 DIAGNOSIS — Z1231 Encounter for screening mammogram for malignant neoplasm of breast: Secondary | ICD-10-CM | POA: Diagnosis not present

## 2022-09-29 DIAGNOSIS — E559 Vitamin D deficiency, unspecified: Secondary | ICD-10-CM | POA: Diagnosis not present

## 2022-09-29 NOTE — Assessment & Plan Note (Addendum)
On alendronate with calcium and D DEXA last month improved but apparently a CT showed severe bone loss of the spine She is seeing Endo today to discuss other treatment options

## 2022-09-29 NOTE — Progress Notes (Signed)
Date:  09/29/2022   Name:  Jennifer Morrow   DOB:  03/22/1951   MRN:  [id number]   Chief Complaint: Annual Exam Jennifer Morrow is a 72 y.o. female who presents today for her Complete Annual Exam. She feels fairly well. She reports exercising- none. She reports she is sleeping fairly well. Breast complaints - none. She is recovering well from Covid diagnosis 8 days ago. Still mild congestion and fatigue but no fever.  She was not able to get Paxlovid (not in stock).  Mammogram: 08/2021 DEXA: 08/2022 improved from 2022 now osteopenia Colonoscopy: 02/2020  Health Maintenance Due  Topic Date Due   COVID-19 Vaccine (7 - 2023-24 season) 06/27/2022   MAMMOGRAM  08/26/2022    Immunization History  Administered Date(s) Administered   Fluad Quad(high Dose 65+) 05/01/2019, 05/19/2021   Hepatitis A 11/01/2002, 05/01/2003   Hepatitis B, adult 08/17/1990   IPV 08/17/1990   Influenza, High Dose Seasonal PF 05/07/2017, 05/05/2018, 04/06/2020   Influenza, Seasonal, Injecte, Preservative Fre 05/09/2013   Influenza,inj,Quad PF,6+ Mos 05/20/2016   Influenza-Unspecified 06/08/2015, 04/21/2022   Meningococcal Conjugate 06/28/2018   PFIZER Comirnaty(Gray Top)Covid-19 Tri-Sucrose Vaccine 04/06/2020   PFIZER(Purple Top)SARS-COV-2 Vaccination 09/09/2019, 10/01/2019, 05/02/2022   PNEUMOCOCCAL CONJUGATE-20 09/23/2021   Pfizer Covid-19 Vaccine Bivalent Booster 90yr & up 05/19/2021, 01/08/2022   Pneumococcal Conjugate-13 07/26/2018   Pneumococcal Polysaccharide-23 07/21/2017   Rabies Immune Globulin 06/28/2018, 07/05/2018, 07/19/2018   Respiratory Syncytial Virus Vaccine,Recomb Aduvanted(Arexvy) 04/21/2022   Rubella 08/17/1981   Smallpox 08/17/1981   Td 08/17/1994   Tdap 01/09/2014   Typhoid Live 06/28/2018   Yellow Fever 06/03/2018   Zoster Recombinat (Shingrix) 11/02/2017, 03/08/2018   Zoster, Live 05/10/2013    HPI  Lab Results  Component Value Date   NA 142 09/23/2021   K 4.9 09/23/2021    CO2 23 09/23/2021   GLUCOSE 96 09/23/2021   BUN 24 09/23/2021   CREATININE 0.75 09/23/2021   CALCIUM 9.3 09/23/2021   EGFR 86 09/23/2021   Lab Results  Component Value Date   CHOL 222 (H) 09/23/2021   HDL 72 09/23/2021   LDLCALC 135 (H) 09/23/2021   TRIG 85 09/23/2021   CHOLHDL 3.1 09/23/2021   Lab Results  Component Value Date   TSH 2.540 09/23/2021   No results found for: "HGBA1C" Lab Results  Component Value Date   WBC 4.0 09/23/2021   HGB 15.3 09/23/2021   HCT 44.2 09/23/2021   MCV 90 09/23/2021   PLT 291 09/23/2021   Lab Results  Component Value Date   ALT 60 (H) 09/23/2021   AST 30 09/23/2021   ALKPHOS 58 09/23/2021   BILITOT 0.4 09/23/2021   Lab Results  Component Value Date   VD25OH 42.6 05/23/2021     Review of Systems  Constitutional:  Positive for fatigue. Negative for chills and fever.  HENT:  Positive for congestion. Negative for hearing loss, tinnitus, trouble swallowing and voice change.   Eyes:  Negative for visual disturbance.  Respiratory:  Positive for cough. Negative for chest tightness, shortness of breath and wheezing.   Cardiovascular:  Negative for chest pain, palpitations and leg swelling.  Gastrointestinal:  Negative for abdominal pain, constipation, diarrhea and vomiting.  Endocrine: Negative for polydipsia and polyuria.  Genitourinary:  Negative for dysuria, frequency, genital sores, vaginal bleeding and vaginal discharge.  Musculoskeletal:  Negative for arthralgias, gait problem and joint swelling.  Skin:  Negative for color change and rash.  Neurological:  Negative for dizziness, tremors, light-headedness and  headaches.  Hematological:  Negative for adenopathy. Does not bruise/bleed easily.  Psychiatric/Behavioral:  Negative for dysphoric mood and sleep disturbance. The patient is not nervous/anxious.     Patient Active Problem List   Diagnosis Date Noted   Osteoarthritis cervical spine 01/14/2022   Age-related osteoporosis  without current pathological fracture 05/19/2021   Spondylosis of lumbar region without myelopathy or radiculopathy 05/19/2021   Degenerative disc disease, lumbar 05/19/2021   Scoliosis, unspecified 01/09/2015   Vitamin D deficiency 01/08/2014   Mixed hyperlipidemia 01/08/2014    Allergies  Allergen Reactions   Celecoxib Other (See Comments)    Elevated LFT's   Oxaprozin Other (See Comments)    Elevated LFT's   Terbinafine Other (See Comments)    Elevated LFT's    Past Surgical History:  Procedure Laterality Date   CATARACT EXTRACTION W/PHACO Left 01/06/2018   Procedure: CATARACT EXTRACTION PHACO AND INTRAOCULAR LENS PLACEMENT ([place]);  Surgeon: Eulogio Bear, MD;  Location: [facility];  Service: Ophthalmology;  Laterality: Left;  [place] 00:27.5 AP% 4.3 CDE 1.18 FLUID PACK LOT # [id number]   CATARACT EXTRACTION W/PHACO Right 02/10/2018   Procedure: CATARACT EXTRACTION PHACO AND INTRAOCULAR LENS PLACEMENT ([facility]);  Surgeon: Eulogio Bear, MD;  Location: [facility];  Service: Ophthalmology;  Laterality: Right;  [place] 00:30.5 AP% 5.0 CDE 1.51  Fluid pack lot # [id number]   FOOT X 2     HEEL SPUR EXCISION     TONSILLECTOMY      Social History   Tobacco Use   Smoking status: Never   Smokeless tobacco: Never  Vaping Use   Vaping Use: Never used  Substance Use Topics   Alcohol use: Yes    Comment: OCCAS   Drug use: Never     Medication list has been reviewed and updated.  Current Meds  Medication Sig   alendronate (FOSAMAX) 70 MG tablet Take 70 mg by mouth once a week.   Calcium-Vitamin D-Vitamin K 650-12.5-40 MG-MCG-MCG CHEW    cetirizine (ZYRTEC) 10 MG tablet Take 10 mg by mouth daily as needed for allergies.   fluticasone (FLONASE) 50 MCG/ACT nasal spray Place into the nose.   gabapentin (NEURONTIN) 300 MG capsule Take 1 capsule by mouth 3 (three) times daily.   HYDROcodone-acetaminophen (NORCO/VICODIN) 5-325 MG tablet Take by mouth as needed. Pt taking 5 times per day    meloxicam (MOBIC) 15 MG tablet Take 15 mg by mouth as needed for pain.   OVER THE COUNTER MEDICATION CBD roll on ointment PRN   tiZANidine (ZANAFLEX) 2 MG tablet Take 2 mg by mouth daily as needed for muscle pain.   Triamcinolone Acetonide (NASACORT ALLERGY 24HR NA) Place 2 sprays into the nose daily as needed (allergies).   Vitamin D, Ergocalciferol, (DRISDOL) 1.25 MG (50000 UNIT) CAPS capsule Take 50,000 Units by mouth every 7 (seven) days.       09/29/2022    8:04 AM 09/09/2022    9:10 AM 09/23/2021    8:43 AM 05/20/2021   11:12 AM  GAD 7 : Generalized Anxiety Score  Nervous, Anxious, on Edge 0 0 0 0  Control/stop worrying 0 0 0 0  Worry too much - different things 0 0 0 0  Trouble relaxing 0 0 0 0  Restless 0 0 0 0  Easily annoyed or irritable 0 0 0 0  Afraid - awful might happen 0 0 0 0  Total GAD 7 Score 0 0 0 0  Anxiety Difficulty Not difficult at all Not difficult  at all Not difficult at all        09/29/2022    8:03 AM 09/09/2022    9:10 AM 01/14/2022   11:25 AM  Depression screen PHQ 2/9  Decreased Interest 1 0 0  Down, Depressed, Hopeless 0 0 0  PHQ - 2 Score 1 0 0  Altered sleeping 1 0   Tired, decreased energy 2 3   Change in appetite 2 2   Feeling bad or failure about yourself  0 0   Trouble concentrating 2 1   Moving slowly or fidgety/restless 0 0   Suicidal thoughts 0 0   PHQ-9 Score 8 6   Difficult doing work/chores Not difficult at all Not difficult at all     BP Readings from Last 3 Encounters:  09/29/22 122/78  09/21/22 112/74  09/09/22 112/70    Physical Exam Vitals and nursing note reviewed.  Constitutional:      General: She is not in acute distress.    Appearance: She is well-developed.  HENT:     Head: Normocephalic and atraumatic.     Right Ear: Tympanic membrane and ear canal normal.     Left Ear: Tympanic membrane and ear canal normal.     Nose:     Right Sinus: No maxillary sinus tenderness.     Left Sinus: No maxillary sinus  tenderness.  Eyes:     General: No scleral icterus.       Right eye: No discharge.        Left eye: No discharge.     Conjunctiva/sclera: Conjunctivae normal.  Neck:     Thyroid: No thyromegaly.     Vascular: No carotid bruit.  Cardiovascular:     Rate and Rhythm: Normal rate and regular rhythm.     Pulses: Normal pulses.     Heart sounds: Normal heart sounds.  Pulmonary:     Effort: Pulmonary effort is normal. No respiratory distress.     Breath sounds: No wheezing.  Chest:  Breasts:    Right: No mass, nipple discharge, skin change or tenderness.     Left: No mass, nipple discharge, skin change or tenderness.  Abdominal:     General: Bowel sounds are normal.     Palpations: Abdomen is soft.     Tenderness: There is no abdominal tenderness.  Musculoskeletal:     Cervical back: Normal range of motion. No erythema.     Right lower leg: No edema.     Left lower leg: No edema.  Lymphadenopathy:     Cervical: No cervical adenopathy.  Skin:    General: Skin is warm and dry.     Findings: No rash.  Neurological:     Mental Status: She is alert and oriented to person, place, and time.     Cranial Nerves: No cranial nerve deficit.     Sensory: No sensory deficit.     Deep Tendon Reflexes: Reflexes are normal and symmetric.  Psychiatric:        Attention and Perception: Attention normal.        Mood and Affect: Mood normal.     Wt Readings from Last 3 Encounters:  09/29/22 193 lb 12.8 oz (87.9 kg)  09/21/22 192 lb (87.1 kg)  09/09/22 196 lb 3.2 oz (89 kg)    BP 122/78   Pulse 68   Ht 5' 7"$  (1.702 m)   Wt 193 lb 12.8 oz (87.9 kg)   SpO2 96%   BMI 30.35 kg/m  Assessment and Plan: Problem List Items Addressed This Visit       Musculoskeletal and Integument   Age-related osteoporosis without current pathological fracture (Chronic)    On alendronate with calcium and D DEXA last month improved but apparently a CT showed severe bone loss of the spine She is seeing  Endo today to discuss other treatment options      Relevant Orders   VITAMIN D 25 Hydroxy (Vit-D Deficiency, Fractures)     Other   Mixed hyperlipidemia (Chronic)    Borderline elevated lipids No currently on medications - will continue low fat diet      Relevant Orders   Lipid panel   Other Visit Diagnoses     Annual physical exam    -  Primary   up to date on immunizations and screenings Mammogram will be scheduled   Encounter for screening mammogram for breast cancer       Relevant Orders   MM 3D SCREEN BREAST BILATERAL   Elevated LFTs       Relevant Orders   Comprehensive metabolic panel   Encounter for long-term (current) use of NSAIDs       Relevant Orders   Comprehensive metabolic panel   CBC with Differential/Platelet        Partially dictated using Dragon software. Any errors are unintentional.  Halina Maidens, MD [facility]  09/29/2022

## 2022-09-29 NOTE — Assessment & Plan Note (Signed)
Borderline elevated lipids No currently on medications - will continue low fat diet

## 2022-09-29 NOTE — Patient Instructions (Signed)
Call [facility] to schedule your mammogram at [phone].  

## 2022-10-01 LAB — CBC WITH DIFFERENTIAL/PLATELET
Basophils Absolute: 0 10*3/uL (ref 0.0–0.2)
Basos: 1 %
EOS (ABSOLUTE): 0 10*3/uL (ref 0.0–0.4)
Eos: 1 %
Hematocrit: 44.4 % (ref 34.0–46.6)
Hemoglobin: 15 g/dL (ref 11.1–15.9)
Immature Grans (Abs): 0 10*3/uL (ref 0.0–0.1)
Immature Granulocytes: 0 %
Lymphocytes Absolute: 2 10*3/uL (ref 0.7–3.1)
Lymphs: 62 %
MCH: 30.4 pg (ref 26.6–33.0)
MCHC: 33.8 g/dL (ref 31.5–35.7)
MCV: 90 fL (ref 79–97)
Monocytes Absolute: 0.3 10*3/uL (ref 0.1–0.9)
Monocytes: 10 %
Neutrophils Absolute: 0.9 10*3/uL — ABNORMAL LOW (ref 1.4–7.0)
Neutrophils: 26 %
Platelets: 267 10*3/uL (ref 150–450)
RBC: 4.93 x10E6/uL (ref 3.77–5.28)
RDW: 12.2 % (ref 11.7–15.4)
WBC: 3.3 10*3/uL — ABNORMAL LOW (ref 3.4–10.8)

## 2022-10-01 LAB — LIPID PANEL
Chol/HDL Ratio: 4.5 ratio — ABNORMAL HIGH (ref 0.0–4.4)
Cholesterol, Total: 205 mg/dL — ABNORMAL HIGH (ref 100–199)
HDL: 46 mg/dL (ref >39)
LDL Chol Calc (NIH): 134 mg/dL — ABNORMAL HIGH (ref 0–99)
Triglycerides: 138 mg/dL (ref 0–149)
VLDL Cholesterol Cal: 25 mg/dL (ref 5–40)

## 2022-10-01 LAB — COMPREHENSIVE METABOLIC PANEL
ALT: 83 IU/L — ABNORMAL HIGH (ref 0–32)
AST: 55 IU/L — ABNORMAL HIGH (ref 0–40)
Albumin/Globulin Ratio: 2.4 — ABNORMAL HIGH (ref 1.2–2.2)
Albumin: 4.8 g/dL (ref 3.9–4.9)
Alkaline Phosphatase: 66 IU/L (ref 44–121)
BUN/Creatinine Ratio: 20 (ref 12–28)
BUN: 15 mg/dL (ref 8–27)
Bilirubin Total: 0.5 mg/dL (ref 0.0–1.2)
CO2: 23 mmol/L (ref 20–29)
Calcium: 9.8 mg/dL (ref 8.7–10.3)
Chloride: 100 mmol/L (ref 96–106)
Creatinine, Ser: 0.76 mg/dL (ref 0.57–1.00)
Globulin, Total: 2 g/dL (ref 1.5–4.5)
Glucose: 105 mg/dL — ABNORMAL HIGH (ref 70–99)
Potassium: 4.6 mmol/L (ref 3.5–5.2)
Sodium: 140 mmol/L (ref 134–144)
Total Protein: 6.8 g/dL (ref 6.0–8.5)
eGFR: 84 mL/min/{1.73_m2} (ref >59)

## 2022-10-01 LAB — VITAMIN D 25 HYDROXY (VIT D DEFICIENCY, FRACTURES): Vit D, 25-Hydroxy: 56.9 ng/mL (ref 30.0–100.0)

## 2022-10-04 ENCOUNTER — Encounter: Payer: Self-pay | Admitting: Internal Medicine

## 2022-10-04 ENCOUNTER — Other Ambulatory Visit: Payer: Self-pay | Admitting: Internal Medicine

## 2022-10-04 DIAGNOSIS — R7989 Other specified abnormal findings of blood chemistry: Secondary | ICD-10-CM

## 2022-10-06 ENCOUNTER — Ambulatory Visit
Admission: RE | Admit: 2022-10-06 | Discharge: 2022-10-06 | Disposition: A | Discharge status: Home / Self Care | Payer: Medicare HMO | Source: Ambulatory Visit | Attending: Internal Medicine | Admitting: Internal Medicine

## 2022-10-06 DIAGNOSIS — Z1231 Encounter for screening mammogram for malignant neoplasm of breast: Secondary | ICD-10-CM | POA: Diagnosis not present

## 2022-10-23 DIAGNOSIS — M810 Age-related osteoporosis without current pathological fracture: Secondary | ICD-10-CM | POA: Diagnosis not present

## 2022-10-27 ENCOUNTER — Other Ambulatory Visit: Payer: Self-pay

## 2022-10-27 DIAGNOSIS — R7989 Other specified abnormal findings of blood chemistry: Secondary | ICD-10-CM

## 2022-10-27 NOTE — Progress Notes (Signed)
Ordered labs

## 2022-10-28 ENCOUNTER — Encounter: Payer: Self-pay | Admitting: Internal Medicine

## 2022-10-28 LAB — HEPATIC FUNCTION PANEL
ALT: 63 IU/L — ABNORMAL HIGH (ref 0–32)
AST: 43 IU/L — ABNORMAL HIGH (ref 0–40)
Albumin: 4.5 g/dL (ref 3.9–4.9)
Alkaline Phosphatase: 64 IU/L (ref 44–121)
Bilirubin Total: 0.5 mg/dL (ref 0.0–1.2)
Bilirubin, Direct: 0.14 mg/dL (ref 0.00–0.40)
Total Protein: 7 g/dL (ref 6.0–8.5)

## 2022-10-28 LAB — HEPATITIS B CORE ANTIBODY, IGM: Hep B C IgM: NEGATIVE

## 2022-10-28 LAB — HEPATITIS A ANTIBODY, IGM: Hep A IgM: NEGATIVE

## 2022-11-09 DIAGNOSIS — M5416 Radiculopathy, lumbar region: Secondary | ICD-10-CM | POA: Diagnosis not present

## 2022-11-09 DIAGNOSIS — M7918 Myalgia, other site: Secondary | ICD-10-CM | POA: Diagnosis not present

## 2022-11-09 DIAGNOSIS — M5136 Other intervertebral disc degeneration, lumbar region: Secondary | ICD-10-CM | POA: Diagnosis not present

## 2022-11-23 DIAGNOSIS — M810 Age-related osteoporosis without current pathological fracture: Secondary | ICD-10-CM | POA: Diagnosis not present

## 2022-12-01 DIAGNOSIS — L718 Other rosacea: Secondary | ICD-10-CM | POA: Diagnosis not present

## 2022-12-01 DIAGNOSIS — D485 Neoplasm of uncertain behavior of skin: Secondary | ICD-10-CM | POA: Diagnosis not present

## 2022-12-01 DIAGNOSIS — Z872 Personal history of diseases of the skin and subcutaneous tissue: Secondary | ICD-10-CM | POA: Diagnosis not present

## 2022-12-01 DIAGNOSIS — Z86018 Personal history of other benign neoplasm: Secondary | ICD-10-CM | POA: Diagnosis not present

## 2022-12-01 DIAGNOSIS — D2272 Melanocytic nevi of left lower limb, including hip: Secondary | ICD-10-CM | POA: Diagnosis not present

## 2022-12-01 DIAGNOSIS — L700 Acne vulgaris: Secondary | ICD-10-CM | POA: Diagnosis not present

## 2022-12-01 DIAGNOSIS — L578 Other skin changes due to chronic exposure to nonionizing radiation: Secondary | ICD-10-CM | POA: Diagnosis not present

## 2022-12-19 ENCOUNTER — Encounter: Payer: Self-pay | Admitting: Internal Medicine

## 2022-12-21 ENCOUNTER — Other Ambulatory Visit: Payer: Self-pay

## 2022-12-21 DIAGNOSIS — R7989 Other specified abnormal findings of blood chemistry: Secondary | ICD-10-CM

## 2022-12-23 DIAGNOSIS — R29898 Other symptoms and signs involving the musculoskeletal system: Secondary | ICD-10-CM | POA: Diagnosis not present

## 2022-12-23 DIAGNOSIS — R7989 Other specified abnormal findings of blood chemistry: Secondary | ICD-10-CM | POA: Diagnosis not present

## 2022-12-23 DIAGNOSIS — M5442 Lumbago with sciatica, left side: Secondary | ICD-10-CM | POA: Diagnosis not present

## 2022-12-23 DIAGNOSIS — G8929 Other chronic pain: Secondary | ICD-10-CM | POA: Diagnosis not present

## 2022-12-24 DIAGNOSIS — M810 Age-related osteoporosis without current pathological fracture: Secondary | ICD-10-CM | POA: Diagnosis not present

## 2022-12-24 LAB — HEPATIC FUNCTION PANEL
ALT: 53 IU/L — ABNORMAL HIGH (ref 0–32)
AST: 36 IU/L (ref 0–40)
Albumin: 4.5 g/dL (ref 3.9–4.9)
Alkaline Phosphatase: 70 IU/L (ref 44–121)
Bilirubin Total: 0.4 mg/dL (ref 0.0–1.2)
Bilirubin, Direct: 0.14 mg/dL (ref 0.00–0.40)
Total Protein: 6.6 g/dL (ref 6.0–8.5)

## 2022-12-24 LAB — HEPATITIS B CORE ANTIBODY, IGM: Hep B C IgM: NEGATIVE

## 2022-12-24 LAB — HEPATITIS A ANTIBODY, IGM: Hep A IgM: NEGATIVE

## 2022-12-25 DIAGNOSIS — M47816 Spondylosis without myelopathy or radiculopathy, lumbar region: Secondary | ICD-10-CM | POA: Diagnosis not present

## 2023-01-18 DIAGNOSIS — M5136 Other intervertebral disc degeneration, lumbar region: Secondary | ICD-10-CM | POA: Diagnosis not present

## 2023-01-18 DIAGNOSIS — M5416 Radiculopathy, lumbar region: Secondary | ICD-10-CM | POA: Diagnosis not present

## 2023-01-19 DIAGNOSIS — H26493 Other secondary cataract, bilateral: Secondary | ICD-10-CM | POA: Diagnosis not present

## 2023-01-19 DIAGNOSIS — Z961 Presence of intraocular lens: Secondary | ICD-10-CM | POA: Diagnosis not present

## 2023-01-19 DIAGNOSIS — H43813 Vitreous degeneration, bilateral: Secondary | ICD-10-CM | POA: Diagnosis not present

## 2023-01-20 ENCOUNTER — Ambulatory Visit (INDEPENDENT_AMBULATORY_CARE_PROVIDER_SITE_OTHER): Payer: Medicare HMO

## 2023-01-20 VITALS — Ht 67.0 in | Wt 193.0 lb

## 2023-01-20 DIAGNOSIS — Z0000 Encounter for general adult medical examination without abnormal findings: Secondary | ICD-10-CM

## 2023-01-20 NOTE — Progress Notes (Signed)
I connected with  SHAKIERRA STRIKE on 01/20/23 by a audio enabled telemedicine application and verified that I am speaking with the correct person using two identifiers.  Patient Location: Home  Provider Location: Office/Clinic  I discussed the limitations of evaluation and management by telemedicine. The patient expressed understanding and agreed to proceed.  Subjective:   Jennifer Morrow is a 72 y.o. female who presents for Medicare Annual (Subsequent) preventive examination.  Review of Systems     Cardiac Risk Factors include: advanced age (>56men, >51 women);dyslipidemia     Objective:    Today's Vitals   01/20/23 1547  Weight: 193 lb (87.5 kg)  Height: 5\' 7"  (1.702 m)   Body mass index is 30.23 kg/m.     01/20/2023    3:35 PM 03/03/2022    1:03 PM 01/14/2022   11:26 AM 02/10/2018    7:42 AM  Advanced Directives  Does Patient Have a Medical Advance Directive? No Yes Yes No  Type of Special educational needs teacher of [place];Living will Healthcare Power of [place];Living will   Does patient want to make changes to medical advance directive?  Yes (Inpatient - patient defers changing a medical advance directive and declines information at this time)    Copy of Healthcare Power of Attorney in Chart?  No - copy requested No - copy requested   Would patient like information on creating a medical advance directive? No - Patient declined       Current Medications (verified) Outpatient Encounter Medications as of 01/20/2023  Medication Sig   Calcium-Vitamin D-Vitamin K 650-12.5-40 MG-MCG-MCG CHEW    cetirizine (ZYRTEC) 10 MG tablet Take 10 mg by mouth daily as needed for allergies.   gabapentin (NEURONTIN) 300 MG capsule Take 1 capsule by mouth 3 (three) times daily.   HYDROcodone-acetaminophen (NORCO/VICODIN) 5-325 MG tablet Take by mouth as needed. Pt taking 5 times per day   meloxicam (MOBIC) 15 MG tablet Take 15 mg by mouth as needed for pain.   tiZANidine (ZANAFLEX) 2 MG  tablet Take 2 mg by mouth daily as needed for muscle pain.   Triamcinolone Acetonide (NASACORT ALLERGY 24HR NA) Place 2 sprays into the nose daily as needed (allergies).   Vitamin D, Ergocalciferol, (DRISDOL) 1.25 MG (50000 UNIT) CAPS capsule Take 50,000 Units by mouth every 7 (seven) days.   alendronate (FOSAMAX) 70 MG tablet Take 70 mg by mouth once a week.   fluticasone (FLONASE) 50 MCG/ACT nasal spray Place into the nose. (Patient not taking: Reported on 01/20/2023)   OVER THE COUNTER MEDICATION CBD roll on ointment PRN   No facility-administered encounter medications on file as of 01/20/2023.    Allergies (verified) Celecoxib, Oxaprozin, and Terbinafine   History: Past Medical History:  Diagnosis Date   Arthritis    Bronchitis    CHRONIC   Bulging lumbar disc    degenerative dics   Scoliosis    LIMITS LUNG FUNCTION   Spinal stenosis    Past Surgical History:  Procedure Laterality Date   CATARACT EXTRACTION W/PHACO Left 01/06/2018   Procedure: CATARACT EXTRACTION PHACO AND INTRAOCULAR LENS PLACEMENT (IOC);  Surgeon: Nevada Crane, MD;  Location: [facility] ORS;  Service: Ophthalmology;  Laterality: Left;  [place] 00:27.5 AP% 4.3 CDE 1.18 FLUID PACK LOT # [id number]   CATARACT EXTRACTION W/PHACO Right 02/10/2018   Procedure: CATARACT EXTRACTION PHACO AND INTRAOCULAR LENS PLACEMENT (IOC);  Surgeon: Nevada Crane, MD;  Location: [facility] ORS;  Service: Ophthalmology;  Laterality: Right;  [place]  00:30.5 AP% 5.0 CDE 1.51  Fluid pack lot # [id number]   FOOT X 2     HEEL SPUR EXCISION     TONSILLECTOMY     Family History  Problem Relation Age of Onset   Heart disease Mother    Heart disease Father    Hypertension Sister    Diabetes Maternal Grandmother    Heart disease Maternal Grandfather    Diabetes Maternal Grandfather    Heart disease Paternal Grandmother    Breast cancer Neg Hx    Social History   Socioeconomic History   Marital status: Widowed    Spouse name: Not on file    Number of children: 2   Years of education: Not on file   Highest education level: Not on file  Occupational History   Not on file  Tobacco Use   Smoking status: Never   Smokeless tobacco: Never  Vaping Use   Vaping Use: Never used  Substance and Sexual Activity   Alcohol use: Yes    Comment: OCCAS   Drug use: Never   Sexual activity: Not Currently  Other Topics Concern   Not on file  Social History Narrative   Pt has had one miscarriage   Pt lives alone   Social Determinants of Health   Financial Resource Strain: Low Risk  (01/20/2023)   Overall Financial Resource Strain (CARDIA)    Difficulty of Paying Living Expenses: Not hard at all  Food Insecurity: No Food Insecurity (01/20/2023)   Hunger Vital Sign    Worried About Running Out of Food in the Last Year: Never true    Ran Out of Food in the Last Year: Never true  Transportation Needs: No Transportation Needs (01/20/2023)   PRAPARE - Administrator, Civil Service (Medical): No    Lack of Transportation (Non-Medical): No  Physical Activity: Sufficiently Active (01/20/2023)   Exercise Vital Sign    Days of Exercise per Week: 4 days    Minutes of Exercise per Session: 60 min  Stress: No Stress Concern Present (01/20/2023)   [employer] of Occupational Health - Occupational Stress Questionnaire    Feeling of Stress : Not at all  Social Connections: Moderately Isolated (01/20/2023)   Social Connection and Isolation Panel [NHANES]    Frequency of Communication with Friends and Family: More than three times a week    Frequency of Social Gatherings with Friends and Family: More than three times a week    Attends Religious Services: Never    Database administrator or Organizations: Yes    Attends Banker Meetings: Not on file    Marital Status: Widowed    Tobacco Counseling Counseling given: Not Answered   Clinical Intake:  Pre-visit preparation completed: Yes  Pain : No/denies pain      Nutritional Risks: None Diabetes: No  How often do you need to have someone help you when you read instructions, pamphlets, or other written materials from your doctor or pharmacy?: 1 - Never  Diabetic?no  Interpreter Needed?: No  Information entered by :: Kennedy Bucker, LPN   Activities of Daily Living    01/20/2023    3:36 PM 01/16/2023    4:04 PM  In your present state of health, do you have any difficulty performing the following activities:  Hearing? 0 0  Vision? 0 0  Difficulty concentrating or making decisions? 0 0  Walking or climbing stairs? 1 1  Comment back   Dressing or bathing?  0 0  Doing errands, shopping? 0 0  Preparing Food and eating ? N N  Using the Toilet? N N  In the past six months, have you accidently leaked urine? N N  Do you have problems with loss of bowel control? N N  Managing your Medications? N N  Managing your Finances? N N  Housekeeping or managing your Housekeeping? Malvin Johns    Patient Care Team: Reubin Milan, MD as PCP - General (Internal Medicine) Meeler, Jodelle Gross, FNP (Physical Medicine and Rehabilitation) Sherlon Handing, MD as Consulting Physician (Endocrinology) Jesusita Oka, MD (Dermatology) Merri Ray, MD as Referring Physician (Physical Medicine and Rehabilitation)  Indicate any recent Medical Services you may have received from other than [facility] providers in the past year (date may be approximate).     Assessment:   This is a routine wellness examination for Meighan.  Hearing/Vision screen Hearing Screening - Comments:: No aids Vision Screening - Comments:: Wears glasses- Dr.King   Dietary issues and exercise activities discussed: Current Exercise Habits: Home exercise routine;Structured exercise class, Type of exercise: Other - see comments (pool exercises), Time (Minutes): 60, Frequency (Times/Week): 4, Weekly Exercise (Minutes/Week): 240, Intensity: Moderate   Goals Addressed             This  Visit's Progress    DIET - EAT MORE FRUITS AND VEGETABLES         Depression Screen    01/20/2023    3:33 PM 09/29/2022    8:03 AM 09/09/2022    9:10 AM 01/14/2022   11:25 AM 09/23/2021    8:42 AM 05/20/2021   11:12 AM  PHQ 2/9 Scores  PHQ - 2 Score 0 1 0 0 0 0  PHQ- 9 Score 0 8 6  3 1     Fall Risk    01/20/2023    3:35 PM 01/16/2023    4:04 PM 09/29/2022    8:04 AM 09/09/2022    9:10 AM 01/14/2022   11:27 AM  Fall Risk   Falls in the past year? 0 0 0 0 1  Number falls in past yr: 0  0 0 1  Injury with Fall? 0  0 0 0  Risk for fall due to : No Fall Risks  No Fall Risks No Fall Risks History of fall(s)  Follow up Falls prevention discussed;Falls evaluation completed  Falls evaluation completed Falls evaluation completed Falls prevention discussed    FALL RISK PREVENTION PERTAINING TO THE HOME:  Any stairs in or around the home? Yes  If so, are there any without handrails? No  Home free of loose throw rugs in walkways, pet beds, electrical cords, etc? Yes  Adequate lighting in your home to reduce risk of falls? Yes   ASSISTIVE DEVICES UTILIZED TO PREVENT FALLS:  Life alert? No  Use of a cane, walker or w/c? Yes - cane Grab bars in the bathroom? Yes  Shower chair or bench in shower? Yes  Elevated toilet seat or a handicapped toilet? Yes   Cognitive Function:        01/20/2023    3:43 PM  6CIT Screen  What Year? 0 points  What month? 0 points  What time? 0 points  Count back from 20 0 points  Months in reverse 0 points  Repeat phrase 0 points  Total Score 0 points    Immunizations Immunization History  Administered Date(s) Administered   Fluad Quad(high Dose 65+) 05/01/2019, 05/19/2021   Hepatitis A 11/01/2002, 05/01/2003  Hepatitis B, ADULT 08/17/1990   IPV 08/17/1990   Influenza, High Dose Seasonal PF 05/07/2017, 05/05/2018, 04/06/2020   Influenza, Seasonal, Injecte, Preservative Fre 05/09/2013   Influenza,inj,Quad PF,6+ Mos 05/20/2016   Influenza-Unspecified  06/08/2015, 04/21/2022   Meningococcal Conjugate 06/28/2018   PFIZER Comirnaty(Gray Top)Covid-19 Tri-Sucrose Vaccine 04/06/2020   PFIZER(Purple Top)SARS-COV-2 Vaccination 09/09/2019, 10/01/2019, 05/02/2022   PNEUMOCOCCAL CONJUGATE-20 09/23/2021   Pfizer Covid-19 Vaccine Bivalent Booster 78yrs & up 05/19/2021, 01/08/2022   Pneumococcal Conjugate-13 07/26/2018   Pneumococcal Polysaccharide-23 07/21/2017   Rabies Immune Globulin 06/28/2018, 07/05/2018, 07/19/2018   Respiratory Syncytial Virus Vaccine,Recomb Aduvanted(Arexvy) 04/21/2022   Rubella 08/17/1981   Smallpox 08/17/1981   Td 08/17/1994   Tdap 01/09/2014   Typhoid Live 06/28/2018   Yellow Fever 06/03/2018   Zoster Recombinat (Shingrix) 11/02/2017, 03/08/2018   Zoster, Live 05/10/2013    TDAP status: Due, Education has been provided regarding the importance of this vaccine. Advised may receive this vaccine at local pharmacy or Health Dept. Aware to provide a copy of the vaccination record if obtained from local pharmacy or Health Dept. Verbalized acceptance and understanding.  Flu Vaccine status: Up to date  Pneumococcal vaccine status: Up to date  Covid-19 vaccine status: Completed vaccines  Qualifies for Shingles Vaccine? Yes   Zostavax completed Yes   Shingrix Completed?: Yes  Screening Tests Health Maintenance  Topic Date Due   COVID-19 Vaccine (7 - 2023-24 season) 06/27/2022   INFLUENZA VACCINE  03/18/2023   MAMMOGRAM  10/07/2023   DTaP/Tdap/Td (3 - Td or Tdap) 01/10/2024   Medicare Annual Wellness (AWV)  01/20/2024   Colonoscopy  03/14/2030   Pneumonia Vaccine 58+ Years old  Completed   DEXA SCAN  Completed   Hepatitis C Screening  Completed   Zoster Vaccines- Shingrix  Completed   HPV VACCINES  Aged Out    Health Maintenance  Health Maintenance Due  Topic Date Due   COVID-19 Vaccine (7 - 2023-24 season) 06/27/2022    Colorectal cancer screening: Type of screening: Colonoscopy. Completed 03/14/20. Repeat  every 10 years  Mammogram status: Completed 10/06/22. Repeat every year  Bone Density status: Completed JANUARY 2024, PER PATIENT. Results reflect: Bone density results: OSTEOPOROSIS. Repeat every 2 years.  Lung Cancer Screening: (Low Dose CT Chest recommended if Age 31-80 years, 30 pack-year currently smoking OR have quit w/in 15years.) does not qualify.    Additional Screening:  Hepatitis C Screening: does qualify; Completed 09/23/21  Vision Screening: Recommended annual ophthalmology exams for early detection of glaucoma and other disorders of the eye. Is the patient up to date with their annual eye exam?  Yes  Who is the provider or what is the name of the office in which the patient attends annual eye exams? Dr.King If pt is not established with a provider, would they like to be referred to a provider to establish care? No .   Dental Screening: Recommended annual dental exams for proper oral hygiene  Community Resource Referral / Chronic Care Management: CRR required this visit?  No   CCM required this visit?  No      Plan:     I have personally reviewed and noted the following in the patient's chart:   Medical and social history Use of alcohol, tobacco or illicit drugs  Current medications and supplements including opioid prescriptions. Patient is currently taking opioid prescriptions. Information provided to patient regarding non-opioid alternatives. Patient advised to discuss non-opioid treatment plan with their provider. Functional ability and status Nutritional status Physical activity Advanced directives  List of other physicians Hospitalizations, surgeries, and ER visits in previous 12 months Vitals Screenings to include cognitive, depression, and falls Referrals and appointments  In addition, I have reviewed and discussed with patient certain preventive protocols, quality metrics, and best practice recommendations. A written personalized care plan for preventive  services as well as general preventive health recommendations were provided to patient.     Hal Hope, LPN   08/22/1094   Nurse Notes: none

## 2023-01-20 NOTE — Patient Instructions (Signed)
Jennifer Morrow , Thank you for taking time to come for your Medicare Wellness Visit. I appreciate your ongoing commitment to your health goals. Please review the following plan we discussed and let me know if I can assist you in the future.   These are the goals we discussed:  Goals      DIET - EAT MORE FRUITS AND VEGETABLES        This is a list of the screening recommended for you and due dates:  Health Maintenance  Topic Date Due   COVID-19 Vaccine (7 - 2023-24 season) 06/27/2022   Flu Shot  03/18/2023   Mammogram  10/07/2023   DTaP/Tdap/Td vaccine (3 - Td or Tdap) 01/10/2024   Medicare Annual Wellness Visit  01/20/2024   Colon Cancer Screening  03/14/2030   Pneumonia Vaccine  Completed   DEXA scan (bone density measurement)  Completed   Hepatitis C Screening  Completed   Zoster (Shingles) Vaccine  Completed   HPV Vaccine  Aged Out    Advanced directives: no  Conditions/risks identified: none  Next appointment: Follow up in one year for your annual wellness visit 01/26/24 @ 2:30 pm by phone   Preventive Care 65 Years and Older, Female Preventive care refers to lifestyle choices and visits with your health care provider that can promote health and wellness. What does preventive care include? A yearly physical exam. This is also called an annual well check. Dental exams once or twice a year. Routine eye exams. Ask your health care provider how often you should have your eyes checked. Personal lifestyle choices, including: Daily care of your teeth and gums. Regular physical activity. Eating a healthy diet. Avoiding tobacco and drug use. Limiting alcohol use. Practicing safe sex. Taking low-dose aspirin every day. Taking vitamin and mineral supplements as recommended by your health care provider. What happens during an annual well check? The services and screenings done by your health care provider during your annual well check will depend on your age, overall health,  lifestyle risk factors, and family history of disease. Counseling  Your health care provider may ask you questions about your: Alcohol use. Tobacco use. Drug use. Emotional well-being. Home and relationship well-being. Sexual activity. Eating habits. History of falls. Memory and ability to understand (cognition). Work and work Astronomer. Reproductive health. Screening  You may have the following tests or measurements: Height, weight, and BMI. Blood pressure. Lipid and cholesterol levels. These may be checked every 5 years, or more frequently if you are over 50 years old. Skin check. Lung cancer screening. You may have this screening every year starting at age 65 if you have a 30-pack-year history of smoking and currently smoke or have quit within the past 15 years. Fecal occult blood test (FOBT) of the stool. You may have this test every year starting at age 94. Flexible sigmoidoscopy or colonoscopy. You may have a sigmoidoscopy every 5 years or a colonoscopy every 10 years starting at age 33. Hepatitis C blood test. Hepatitis B blood test. Sexually transmitted disease (STD) testing. Diabetes screening. This is done by checking your blood sugar (glucose) after you have not eaten for a while (fasting). You may have this done every 1-3 years. Bone density scan. This is done to screen for osteoporosis. You may have this done starting at age 34. Mammogram. This may be done every 1-2 years. Talk to your health care provider about how often you should have regular mammograms. Talk with your health care provider about your  test results, treatment options, and if necessary, the need for more tests. Vaccines  Your health care provider may recommend certain vaccines, such as: Influenza vaccine. This is recommended every year. Tetanus, diphtheria, and acellular pertussis (Tdap, Td) vaccine. You may need a Td booster every 10 years. Zoster vaccine. You may need this after age  81. Pneumococcal 13-valent conjugate (PCV13) vaccine. One dose is recommended after age 13. Pneumococcal polysaccharide (PPSV23) vaccine. One dose is recommended after age 7. Talk to your health care provider about which screenings and vaccines you need and how often you need them. This information is not intended to replace advice given to you by your health care provider. Make sure you discuss any questions you have with your health care provider. Document Released: 08/30/2015 Document Revised: 04/22/2016 Document Reviewed: 06/04/2015 Elsevier Interactive Patient Education  2017 [employer].  Fall Prevention in the Home Falls can cause injuries. They can happen to people of all ages. There are many things you can do to make your home safe and to help prevent falls. What can I do on the outside of my home? Regularly fix the edges of walkways and driveways and fix any cracks. Remove anything that might make you trip as you walk through a door, such as a raised step or threshold. Trim any bushes or trees on the path to your home. Use bright outdoor lighting. Clear any walking paths of anything that might make someone trip, such as rocks or tools. Regularly check to see if handrails are loose or broken. Make sure that both sides of any steps have handrails. Any raised decks and porches should have guardrails on the edges. Have any leaves, snow, or ice cleared regularly. Use sand or salt on walking paths during winter. Clean up any spills in your garage right away. This includes oil or grease spills. What can I do in the bathroom? Use night lights. Install grab bars by the toilet and in the tub and shower. Do not use towel bars as grab bars. Use non-skid mats or decals in the tub or shower. If you need to sit down in the shower, use a plastic, non-slip stool. Keep the floor dry. Clean up any water that spills on the floor as soon as it happens. Remove soap buildup in the tub or shower  regularly. Attach bath mats securely with double-sided non-slip rug tape. Do not have throw rugs and other things on the floor that can make you trip. What can I do in the bedroom? Use night lights. Make sure that you have a light by your bed that is easy to reach. Do not use any sheets or blankets that are too big for your bed. They should not hang down onto the floor. Have a firm chair that has side arms. You can use this for support while you get dressed. Do not have throw rugs and other things on the floor that can make you trip. What can I do in the kitchen? Clean up any spills right away. Avoid walking on wet floors. Keep items that you use a lot in easy-to-reach places. If you need to reach something above you, use a strong step stool that has a grab bar. Keep electrical cords out of the way. Do not use floor polish or wax that makes floors slippery. If you must use wax, use non-skid floor wax. Do not have throw rugs and other things on the floor that can make you trip. What can I do with my  stairs? Do not leave any items on the stairs. Make sure that there are handrails on both sides of the stairs and use them. Fix handrails that are broken or loose. Make sure that handrails are as long as the stairways. Check any carpeting to make sure that it is firmly attached to the stairs. Fix any carpet that is loose or worn. Avoid having throw rugs at the top or bottom of the stairs. If you do have throw rugs, attach them to the floor with carpet tape. Make sure that you have a light switch at the top of the stairs and the bottom of the stairs. If you do not have them, ask someone to add them for you. What else can I do to help prevent falls? Wear shoes that: Do not have high heels. Have rubber bottoms. Are comfortable and fit you well. Are closed at the toe. Do not wear sandals. If you use a stepladder: Make sure that it is fully opened. Do not climb a closed stepladder. Make sure that  both sides of the stepladder are locked into place. Ask someone to hold it for you, if possible. Clearly mark and make sure that you can see: Any grab bars or handrails. First and last steps. Where the edge of each step is. Use tools that help you move around (mobility aids) if they are needed. These include: Canes. Walkers. Scooters. Crutches. Turn on the lights when you go into a dark area. Replace any light bulbs as soon as they burn out. Set up your furniture so you have a clear path. Avoid moving your furniture around. If any of your floors are uneven, fix them. If there are any pets around you, be aware of where they are. Review your medicines with your doctor. Some medicines can make you feel dizzy. This can increase your chance of falling. Ask your doctor what other things that you can do to help prevent falls. This information is not intended to replace advice given to you by your health care provider. Make sure you discuss any questions you have with your health care provider. Document Released: 05/30/2009 Document Revised: 01/09/2016 Document Reviewed: 09/07/2014 Elsevier Interactive Patient Education  2017 [employer].

## 2023-01-25 DIAGNOSIS — M810 Age-related osteoporosis without current pathological fracture: Secondary | ICD-10-CM | POA: Diagnosis not present

## 2023-02-25 DIAGNOSIS — M810 Age-related osteoporosis without current pathological fracture: Secondary | ICD-10-CM | POA: Diagnosis not present

## 2023-02-26 DIAGNOSIS — M5416 Radiculopathy, lumbar region: Secondary | ICD-10-CM | POA: Diagnosis not present

## 2023-02-26 DIAGNOSIS — M48062 Spinal stenosis, lumbar region with neurogenic claudication: Secondary | ICD-10-CM | POA: Diagnosis not present

## 2023-03-01 ENCOUNTER — Ambulatory Visit: Payer: Medicare HMO | Admitting: Internal Medicine

## 2023-03-19 DIAGNOSIS — M5136 Other intervertebral disc degeneration, lumbar region: Secondary | ICD-10-CM | POA: Diagnosis not present

## 2023-03-19 DIAGNOSIS — M5416 Radiculopathy, lumbar region: Secondary | ICD-10-CM | POA: Diagnosis not present

## 2023-03-29 DIAGNOSIS — M810 Age-related osteoporosis without current pathological fracture: Secondary | ICD-10-CM | POA: Diagnosis not present

## 2023-03-29 DIAGNOSIS — E559 Vitamin D deficiency, unspecified: Secondary | ICD-10-CM | POA: Diagnosis not present

## 2023-04-09 DIAGNOSIS — M5416 Radiculopathy, lumbar region: Secondary | ICD-10-CM | POA: Diagnosis not present

## 2023-04-09 DIAGNOSIS — M48062 Spinal stenosis, lumbar region with neurogenic claudication: Secondary | ICD-10-CM | POA: Diagnosis not present

## 2023-04-15 ENCOUNTER — Encounter: Payer: Self-pay | Admitting: Internal Medicine

## 2023-04-16 NOTE — Telephone Encounter (Signed)
Please review.  KP

## 2023-04-29 ENCOUNTER — Telehealth: Payer: Self-pay | Admitting: Internal Medicine

## 2023-04-29 ENCOUNTER — Other Ambulatory Visit: Payer: Self-pay

## 2023-04-29 DIAGNOSIS — M810 Age-related osteoporosis without current pathological fracture: Secondary | ICD-10-CM | POA: Diagnosis not present

## 2023-04-29 DIAGNOSIS — R748 Abnormal levels of other serum enzymes: Secondary | ICD-10-CM

## 2023-04-29 NOTE — Telephone Encounter (Signed)
Patient will contact health department to discuss vaccines.

## 2023-04-29 NOTE — Telephone Encounter (Signed)
Copied from CRM [id number]. 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 Please assist her further as she will be travelling to [place] in November but she is also travelling to [place] in 2 weeks to see her grand kids and wants to make sure she is ok.

## 2023-04-29 NOTE — Telephone Encounter (Signed)
Hepatic Panel ordered for recheck. Patient informed and will go to [facility] to have this done.  - Jennifer Morrow

## 2023-04-29 NOTE — Telephone Encounter (Signed)
Copied from CRM [id number]. Topic: Appointment Scheduling - Scheduling Inquiry for Clinic >> Apr 29, 2023 10:34 AM Marlow Baars wrote: Reason for CRM: The patient called in requesting an order to get labs drawn at [facility]. She was told at her last appt to get labs drawn in a few months. Please assist patient further.

## 2023-04-30 DIAGNOSIS — M5136 Other intervertebral disc degeneration, lumbar region: Secondary | ICD-10-CM | POA: Diagnosis not present

## 2023-04-30 DIAGNOSIS — M5416 Radiculopathy, lumbar region: Secondary | ICD-10-CM | POA: Diagnosis not present

## 2023-04-30 DIAGNOSIS — M48062 Spinal stenosis, lumbar region with neurogenic claudication: Secondary | ICD-10-CM | POA: Diagnosis not present

## 2023-04-30 DIAGNOSIS — R748 Abnormal levels of other serum enzymes: Secondary | ICD-10-CM | POA: Diagnosis not present

## 2023-05-01 LAB — HEPATIC FUNCTION PANEL
ALT: 47 IU/L — ABNORMAL HIGH (ref 0–32)
AST: 32 IU/L (ref 0–40)
Albumin: 4.5 g/dL (ref 3.8–4.8)
Alkaline Phosphatase: 72 IU/L (ref 44–121)
Bilirubin Total: 0.5 mg/dL (ref 0.0–1.2)
Bilirubin, Direct: 0.14 mg/dL (ref 0.00–0.40)
Total Protein: 6.7 g/dL (ref 6.0–8.5)

## 2023-05-02 ENCOUNTER — Other Ambulatory Visit: Payer: Self-pay | Admitting: Internal Medicine

## 2023-05-04 ENCOUNTER — Other Ambulatory Visit: Payer: Self-pay | Admitting: Internal Medicine

## 2023-05-04 DIAGNOSIS — R748 Abnormal levels of other serum enzymes: Secondary | ICD-10-CM

## 2023-05-04 NOTE — Telephone Encounter (Signed)
Please review.  KP

## 2023-05-04 NOTE — Telephone Encounter (Signed)
Please review.  KP

## 2023-05-06 ENCOUNTER — Ambulatory Visit
Admission: RE | Admit: 2023-05-06 | Discharge: 2023-05-06 | Disposition: A | Discharge status: Home / Self Care | Payer: Medicare HMO | Source: Ambulatory Visit | Attending: Internal Medicine | Admitting: Internal Medicine

## 2023-05-06 DIAGNOSIS — R7989 Other specified abnormal findings of blood chemistry: Secondary | ICD-10-CM | POA: Diagnosis not present

## 2023-05-06 DIAGNOSIS — R748 Abnormal levels of other serum enzymes: Secondary | ICD-10-CM | POA: Diagnosis not present

## 2023-05-07 ENCOUNTER — Encounter: Payer: Self-pay | Admitting: Internal Medicine

## 2023-06-01 DIAGNOSIS — M810 Age-related osteoporosis without current pathological fracture: Secondary | ICD-10-CM | POA: Diagnosis not present

## 2023-06-10 ENCOUNTER — Encounter: Payer: Self-pay | Admitting: Internal Medicine

## 2023-06-11 ENCOUNTER — Encounter: Payer: Self-pay | Admitting: Internal Medicine

## 2023-06-14 ENCOUNTER — Encounter: Payer: Self-pay | Admitting: Internal Medicine

## 2023-06-14 DIAGNOSIS — R748 Abnormal levels of other serum enzymes: Secondary | ICD-10-CM | POA: Insufficient documentation

## 2023-06-16 DIAGNOSIS — M419 Scoliosis, unspecified: Secondary | ICD-10-CM | POA: Diagnosis not present

## 2023-06-16 DIAGNOSIS — M50321 Other cervical disc degeneration at C4-C5 level: Secondary | ICD-10-CM | POA: Diagnosis not present

## 2023-06-16 DIAGNOSIS — M4135 Thoracogenic scoliosis, thoracolumbar region: Secondary | ICD-10-CM | POA: Diagnosis not present

## 2023-06-16 DIAGNOSIS — M5134 Other intervertebral disc degeneration, thoracic region: Secondary | ICD-10-CM | POA: Diagnosis not present

## 2023-06-16 DIAGNOSIS — M5116 Intervertebral disc disorders with radiculopathy, lumbar region: Secondary | ICD-10-CM | POA: Diagnosis not present

## 2023-06-16 DIAGNOSIS — G8929 Other chronic pain: Secondary | ICD-10-CM | POA: Diagnosis not present

## 2023-06-16 DIAGNOSIS — M545 Low back pain, unspecified: Secondary | ICD-10-CM | POA: Diagnosis not present

## 2023-06-22 DIAGNOSIS — M5116 Intervertebral disc disorders with radiculopathy, lumbar region: Secondary | ICD-10-CM | POA: Diagnosis not present

## 2023-06-22 DIAGNOSIS — M48061 Spinal stenosis, lumbar region without neurogenic claudication: Secondary | ICD-10-CM | POA: Diagnosis not present

## 2023-06-22 DIAGNOSIS — G8929 Other chronic pain: Secondary | ICD-10-CM | POA: Diagnosis not present

## 2023-06-22 DIAGNOSIS — M545 Low back pain, unspecified: Secondary | ICD-10-CM | POA: Diagnosis not present

## 2023-07-13 DIAGNOSIS — M810 Age-related osteoporosis without current pathological fracture: Secondary | ICD-10-CM | POA: Diagnosis not present

## 2023-07-19 ENCOUNTER — Encounter: Payer: Self-pay | Admitting: Internal Medicine

## 2023-07-19 ENCOUNTER — Ambulatory Visit (INDEPENDENT_AMBULATORY_CARE_PROVIDER_SITE_OTHER): Payer: Medicare HMO | Admitting: Internal Medicine

## 2023-07-19 VITALS — BP 110/70 | HR 62 | Temp 97.5°F | Ht 67.0 in | Wt 182.6 lb

## 2023-07-19 DIAGNOSIS — J0100 Acute maxillary sinusitis, unspecified: Secondary | ICD-10-CM

## 2023-07-19 MED ORDER — AZITHROMYCIN 250 MG PO TABS: ORAL_TABLET | ORAL | 0 refills | Status: AC

## 2023-07-19 MED ORDER — TRIAMCINOLONE ACETONIDE 55 MCG/ACT NA AERO: 2.0000 | INHALATION_SPRAY | Freq: Two times a day (BID) | NASAL | 0 refills | Status: AC

## 2023-07-19 NOTE — Progress Notes (Signed)
Date:  07/19/2023   Name:  Jennifer Morrow   DOB:  03-09-1951   MRN:  [id number]   Chief Complaint: Cough (Started 1 week ago. Cough- some production (clear). Nasal congestion, sinus pressure, headache, slight bilateral ear pain. Covid test Negative at home. )  Sinus Problem This is a new problem. The current episode started in the past 7 days. There has been no fever. Associated symptoms include congestion, coughing, headaches and sinus pressure. Pertinent negatives include no chills, ear pain or shortness of breath.    Review of Systems  Constitutional:  Positive for appetite change and fatigue. Negative for chills, fever and unexpected weight change.  HENT:  Positive for congestion, postnasal drip and sinus pressure. Negative for ear pain and hearing loss.   Respiratory:  Positive for cough. Negative for chest tightness, shortness of breath and wheezing.   Cardiovascular:  Negative for chest pain and palpitations.  Neurological:  Positive for headaches. Negative for dizziness.     Lab Results  Component Value Date   NA 140 09/29/2022   K 4.6 09/29/2022   CO2 23 09/29/2022   GLUCOSE 105 (H) 09/29/2022   BUN 15 09/29/2022   CREATININE 0.76 09/29/2022   CALCIUM 9.8 09/29/2022   EGFR 84 09/29/2022   Lab Results  Component Value Date   CHOL 205 (H) 09/29/2022   HDL 46 09/29/2022   LDLCALC 134 (H) 09/29/2022   TRIG 138 09/29/2022   CHOLHDL 4.5 (H) 09/29/2022   Lab Results  Component Value Date   TSH 2.540 09/23/2021   No results found for: "HGBA1C" Lab Results  Component Value Date   WBC 3.3 (L) 09/29/2022   HGB 15.0 09/29/2022   HCT 44.4 09/29/2022   MCV 90 09/29/2022   PLT 267 09/29/2022   Lab Results  Component Value Date   ALT 47 (H) 04/30/2023   AST 32 04/30/2023   ALKPHOS 72 04/30/2023   BILITOT 0.5 04/30/2023   Lab Results  Component Value Date   VD25OH 56.9 09/29/2022     Patient Active Problem List   Diagnosis Date Noted   Elevated liver enzymes  06/14/2023   Osteoarthritis cervical spine 01/14/2022   Age-related osteoporosis without current pathological fracture 05/19/2021   Spondylosis of lumbar region without myelopathy or radiculopathy 05/19/2021   Degenerative disc disease, lumbar 05/19/2021   Scoliosis, unspecified 01/09/2015   Vitamin D deficiency 01/08/2014   Mixed hyperlipidemia 01/08/2014    Allergies  Allergen Reactions   Celecoxib Other (See Comments)    Elevated LFT's   Oxaprozin Other (See Comments)    Elevated LFT's   Terbinafine Other (See Comments)    Elevated LFT's    Past Surgical History:  Procedure Laterality Date   CATARACT EXTRACTION W/PHACO Left 01/06/2018   Procedure: CATARACT EXTRACTION PHACO AND INTRAOCULAR LENS PLACEMENT (IOC);  Surgeon: Nevada Crane, MD;  Location: [facility];  Service: Ophthalmology;  Laterality: Left;  [place] 00:27.5 AP% 4.3 CDE 1.18 FLUID PACK LOT # [id number]   CATARACT EXTRACTION W/PHACO Right 02/10/2018   Procedure: CATARACT EXTRACTION PHACO AND INTRAOCULAR LENS PLACEMENT (IOC);  Surgeon: Nevada Crane, MD;  Location: [facility];  Service: Ophthalmology;  Laterality: Right;  [place] 00:30.5 AP% 5.0 CDE 1.51  Fluid pack lot # [id number]   FOOT X 2     HEEL SPUR EXCISION     TONSILLECTOMY      Social History   Tobacco Use   Smoking status: Never   Smokeless tobacco: Never  Vaping Use   Vaping status: Never Used  Substance Use Topics   Alcohol use: Yes    Comment: OCCAS   Drug use: Never     Medication list has been reviewed and updated.  Current Meds  Medication Sig   azithromycin (ZITHROMAX Z-PAK) 250 MG tablet UAD   Calcium-Vitamin D-Vitamin K 650-12.5-40 MG-MCG-MCG CHEW    cetirizine (ZYRTEC) 10 MG tablet Take 10 mg by mouth daily as needed for allergies.   gabapentin (NEURONTIN) 300 MG capsule Take 2 capsules by mouth 3 (three) times daily.   HYDROcodone-acetaminophen (NORCO/VICODIN) 5-325 MG tablet Take by mouth as needed. Pt taking 5 times per day    meloxicam (MOBIC) 15 MG tablet Take 15 mg by mouth as needed for pain.   Romosozumab-aqqg (EVENITY) 105 MG/1. SOSY injection Inject into the skin.   tiZANidine (ZANAFLEX) 2 MG tablet Take 2 mg by mouth daily as needed for muscle pain.   Vitamin D, Ergocalciferol, (DRISDOL) 1.25 MG (50000 UNIT) CAPS capsule Take 50,000 Units by mouth every 7 (seven) days.   [DISCONTINUED] Triamcinolone Acetonide (NASACORT ALLERGY 24HR NA) Place 2 sprays into the nose daily as needed (allergies).       07/19/2023    9:09 AM 09/29/2022    8:04 AM 09/09/2022    9:10 AM 09/23/2021    8:43 AM  GAD 7 : Generalized Anxiety Score  Nervous, Anxious, on Edge 0 0 0 0  Control/stop worrying 0 0 0 0  Worry too much - different things 0 0 0 0  Trouble relaxing 0 0 0 0  Restless 0 0 0 0  Easily annoyed or irritable 0 0 0 0  Afraid - awful might happen 0 0 0 0  Total GAD 7 Score 0 0 0 0  Anxiety Difficulty Not difficult at all Not difficult at all Not difficult at all Not difficult at all       07/19/2023    9:09 AM 01/20/2023    3:33 PM 09/29/2022    8:03 AM  Depression screen PHQ 2/9  Decreased Interest 2 0 1  Down, Depressed, Hopeless 0 0 0  PHQ - 2 Score 2 0 1  Altered sleeping 2 0 1  Tired, decreased energy 2 0 2  Change in appetite 2 0 2  Feeling bad or failure about yourself  0 0 0  Trouble concentrating 2 0 2  Moving slowly or fidgety/restless 0 0 0  Suicidal thoughts 0 0 0  PHQ-9 Score 10 0 8  Difficult doing work/chores Not difficult at all Not difficult at all Not difficult at all    BP Readings from Last 3 Encounters:  07/19/23 110/70  09/29/22 122/78  09/21/22 112/74    Physical Exam Vitals and nursing note reviewed.  Constitutional:      General: She is not in acute distress.    Appearance: Normal appearance. She is well-developed.  HENT:     Head: Normocephalic and atraumatic.     Right Ear: Ear canal and external ear normal. Tympanic membrane is not erythematous or retracted.      Left Ear: Ear canal and external ear normal. Tympanic membrane is not erythematous or retracted.     Nose:     Right Sinus: Maxillary sinus tenderness and frontal sinus tenderness present.     Left Sinus: Maxillary sinus tenderness and frontal sinus tenderness present.  Cardiovascular:     Rate and Rhythm: Normal rate and regular rhythm.     Heart sounds:  Normal heart sounds.  Pulmonary:     Effort: Pulmonary effort is normal. No respiratory distress.     Breath sounds: Normal breath sounds. No wheezing or rales.  Lymphadenopathy:     Cervical: No cervical adenopathy.  Skin:    General: Skin is warm and dry.     Findings: No rash.  Neurological:     Mental Status: She is alert and oriented to person, place, and time.  Psychiatric:        Mood and Affect: Mood normal.        Behavior: Behavior normal.     Wt Readings from Last 3 Encounters:  07/19/23 182 lb 9.6 oz (82.8 kg)  01/20/23 193 lb (87.5 kg)  09/29/22 193 lb 12.8 oz (87.9 kg)    BP 110/70   Pulse 62   Temp (!) 97.5 F (36.4 C) (Oral)   Ht 5\' 7"  (1.702 m)   Wt 182 lb 9.6 oz (82.8 kg)   SpO2 100%   BMI 28.60 kg/m   Assessment and Plan:  Problem List Items Addressed This Visit   None Visit Diagnoses     Acute non-recurrent maxillary sinusitis    -  Primary   Increase fluid intake; resume steroid nasal spray Zpak follow up if needed   Relevant Medications   azithromycin (ZITHROMAX Z-PAK) 250 MG tablet   triamcinolone (NASACORT ALLERGY 24HR) 55 MCG/ACT AERO nasal inhaler       No follow-ups on file.    Reubin Milan, MD [facility] [place]

## 2023-07-20 DIAGNOSIS — M419 Scoliosis, unspecified: Secondary | ICD-10-CM | POA: Diagnosis not present

## 2023-07-29 DIAGNOSIS — H26493 Other secondary cataract, bilateral: Secondary | ICD-10-CM | POA: Diagnosis not present

## 2023-07-30 ENCOUNTER — Ambulatory Visit: Payer: Medicare HMO | Attending: Spine Surgery | Admitting: Physical Therapy

## 2023-07-30 DIAGNOSIS — M6281 Muscle weakness (generalized): Secondary | ICD-10-CM | POA: Diagnosis not present

## 2023-07-30 DIAGNOSIS — M2560 Stiffness of unspecified joint, not elsewhere classified: Secondary | ICD-10-CM | POA: Diagnosis not present

## 2023-07-30 DIAGNOSIS — M4126 Other idiopathic scoliosis, lumbar region: Secondary | ICD-10-CM | POA: Diagnosis not present

## 2023-07-30 NOTE — Therapy (Addendum)
OUTPATIENT PHYSICAL THERAPY THORACOLUMBAR EVALUATION  Patient Name: Jennifer Morrow MRN: [id number] DOB:1950/08/21, 72 y.o., female Today's Date: 07/31/2023  END OF SESSION:  PT End of Session - 07/30/23 0858     Visit Number 1    Number of Visits 12    Date for PT Re-Evaluation 09/10/23    PT Start Time 0859    PT Stop Time 0949    PT Time Calculation (min) 50 min    Activity Tolerance Patient tolerated treatment well;Patient limited by pain    Behavior During Therapy [facility] for tasks assessed/performed            Past Medical History:  Diagnosis Date   Arthritis    Bronchitis    CHRONIC   Bulging lumbar disc    degenerative dics   Scoliosis    LIMITS LUNG FUNCTION   Spinal stenosis    Past Surgical History:  Procedure Laterality Date   CATARACT EXTRACTION W/PHACO Left 01/06/2018   Procedure: CATARACT EXTRACTION PHACO AND INTRAOCULAR LENS PLACEMENT (IOC);  Surgeon: Nevada Crane, MD;  Location: [facility];  Service: Ophthalmology;  Laterality: Left;  [place] 00:27.5 AP% 4.3 CDE 1.18 FLUID PACK LOT # [id number]   CATARACT EXTRACTION W/PHACO Right 02/10/2018   Procedure: CATARACT EXTRACTION PHACO AND INTRAOCULAR LENS PLACEMENT (IOC);  Surgeon: Nevada Crane, MD;  Location: [facility];  Service: Ophthalmology;  Laterality: Right;  [place] 00:30.5 AP% 5.0 CDE 1.51  Fluid pack lot # [id number]   FOOT X 2     HEEL SPUR EXCISION     TONSILLECTOMY     Patient Active Problem List   Diagnosis Date Noted   Elevated liver enzymes 06/14/2023   Osteoarthritis cervical spine 01/14/2022   Age-related osteoporosis without current pathological fracture 05/19/2021   Spondylosis of lumbar region without myelopathy or radiculopathy 05/19/2021   Degenerative disc disease, lumbar 05/19/2021   Scoliosis, unspecified 01/09/2015   Vitamin D deficiency 01/08/2014   Mixed hyperlipidemia 01/08/2014   PCP: Bari Edward, MD  REFERRING PROVIDER: Leana Gamer, MD  REFERRING DIAG: M41.9  (ICD-10-CM) - Scoliosis, unspecified   Rationale for Evaluation and Treatment: Rehabilitation  THERAPY DIAG:  Other idiopathic scoliosis, lumbar region  Joint stiffness of spine  Muscle weakness (generalized)  ONSET DATE: chronic          SUBJECTIVE:                                                                                                                                                                                           SUBJECTIVE STATEMENT: Pt. Reports chronic low back pain everyday.  Pt. Uses MH and hot  tub daily to manage symptoms.  Increase pain with prolonged standing.  Pt. Returned to [place]/ [place] after a 19 day trip (07/11/23).  Pt. Planning to return to pool at [employer] 2x/week, Yoga 1x/week, personal trainer 1x/week.  Pt. Is considering major back surgery with Dr. Pam Drown (f/u 08/31/23) if bone density is improved.  Pt. Reports L LE/calf/foot N/T and weakness (no falls reported).  Pt. Wants to return to walking with trekking poles and no back symptoms.    PERTINENT HISTORY:  Pt. Well known to PT clinic.    PAIN:  Are you having pain? Yes: NPRS scale: 2/10 Pain location: low back Pain description: aching Aggravating factors: increase activity/ prolonged standing and walking Relieving factors: heat (hot tub use)  PRECAUTIONS: Back  RED FLAGS: None   WEIGHT BEARING RESTRICTIONS: No  FALLS:  Has patient fallen in last 6 months? No  LIVING ENVIRONMENT: Lives with: lives alone Lives in: House/apartment Has following equipment at home: Single point cane  OCCUPATION: Retired  PLOF: Independent  PATIENT GOALS: Increase activity during the day with less back pain.    NEXT MD VISIT: 08/31/23  OBJECTIVE:  Note: Objective measures were completed at Evaluation unless otherwise noted.  DIAGNOSTIC FINDINGS:  See imaging  PATIENT SURVEYS:  FOTO initial 37/ goal 50  COGNITION: Overall cognitive status: Within functional limits for tasks  assessed     SENSATION: L LE N/T  MUSCLE LENGTH: Hamstrings: Right 85 deg; Left 82 deg  POSTURE: rounded shoulders and forward head.  Moderate scoliosis (L to R).    PALPATION: (+) tenderness in R low thoracic/lumbar spine (hypomobile).    LUMBAR ROM:   AROM eval  Flexion 25% limited  Extension 75% limited  Right lateral flexion 50% limited  Left lateral flexion 50% limited  Right rotation 50% limited  Left rotation 50% limited   (Blank rows = not tested)  LOWER EXTREMITY ROM:     Active  Right eval Left eval  Hip flexion 98 deg. 100 deg.  Hip extension    Hip abduction    Hip adduction    Hip internal rotation 30 deg. 30 deg.  Hip external rotation    Knee flexion [facility] WFL  Knee extension [facility]  Ankle dorsiflexion    Ankle plantarflexion    Ankle inversion    Ankle eversion     (Blank rows = not tested)  LOWER EXTREMITY MMT:    MMT Right eval Left eval  Hip flexion 3/5 3/5  Hip extension    Hip abduction 3+ 3+  Hip adduction    Hip internal rotation 3+ 3+  Hip external rotation 3+ 3+  Knee flexion 4 4  Knee extension 4 4  Ankle dorsiflexion 4 4  Ankle plantarflexion    Ankle inversion    Ankle eversion     (Blank rows = not tested)  LUMBAR SPECIAL TESTS:  FABER test: Negative  FUNCTIONAL TESTS:  5 times sit to stand: TBD  GAIT: Distance walked: in clinic Assistive device utilized: None Level of assistance: Modified independence Comments: Pt. Ambulates with consistent recip. Gait pattern with no assistive devices.  Pt. Has rounded shoulder posture with limited arm swing.  Good heel strike/ clearance.    TREATMENT DATE:  07/30/23                 See evaluation/ HEP  PATIENT EDUCATION:  Education details: Aquatic ex./ stretches/ HEP Person educated: Patient Education method: Medical illustrator Education  comprehension: verbalized understanding and returned demonstration  HOME EXERCISE PROGRAM: TrA ex. Program (page #1-3)- pain limited with bridging.  Modified with bolster to assist with less pain.    ASSESSMENT:  CLINICAL IMPRESSION: Patient is a pleasant 72 y.o. female who was seen today for physical therapy evaluation and treatment for scoliosis/ chronic back pain.  Pt. Is considering major back surgery if her bone density has improved/ MD approves.  Pt. Presents with marked spinal ROM limitations and strength deficits.  Pt. Will benefit from skilled PT services to develop HEP to increase generalized strength to improve pain-free functional mobility.      OBJECTIVE IMPAIRMENTS: Abnormal gait, decreased activity tolerance, decreased endurance, decreased mobility, difficulty walking, decreased ROM, decreased strength, impaired flexibility, improper body mechanics, postural dysfunction, and pain.   ACTIVITY LIMITATIONS: carrying, lifting, bending, standing, squatting, transfers, and locomotion level  PARTICIPATION LIMITATIONS: cleaning, laundry, driving, shopping, and community activity  PERSONAL FACTORS: Fitness and Past/current experiences are also affecting patient's functional outcome.   REHAB POTENTIAL: Good  CLINICAL DECISION MAKING: Evolving/moderate complexity  EVALUATION COMPLEXITY: Moderate   GOALS: Goals reviewed with patient? Yes  SHORT TERM GOALS: Target date: 08/13/23  Pt. Independent with HEP to progress LE muscle strength 1/2 muscle grade to improve pain-free mobility.   Baseline: Goal status: INITIAL  LONG TERM GOALS: Target date: 09/10/23  Pt. Will increase FOTO to 50 to improve pain-free mobility.   Baseline: 37 Goal status: INITIAL  2.  Pt. Will increase lumbar AROM 25% to improve functional mobility/ household tasks.   Baseline: See above Goal status: INITIAL  3.  Pt. Will report <5/10 back pain at worst with daily household tasks.   Baseline: 8/10 pain  at worst Goal status: INITIAL  4.  Pt. Will increase B hip/LE muscle strength to 4+/5 MMT to improve standing/walking tasks.   Baseline: see above Goal status: INITIAL  PLAN:  PT FREQUENCY: 2x/week  PT DURATION: 6 weeks  PLANNED INTERVENTIONS: 97110-Therapeutic exercises, 97530- Therapeutic activity, [id number]- Neuromuscular re-education, 97535- Self Care, [id number]- Manual therapy, [phone]- Gait training, [phone]- Aquatic Therapy, Patient/Family education, Balance training, Stair training, Dry Needling, Joint mobilization, Cryotherapy, and Moist heat.  PLAN FOR NEXT SESSION: Progress HEP  Cammie Mcgee, PT, DPT # [phone] 07/31/2023, 10:38 AM

## 2023-08-02 ENCOUNTER — Encounter: Payer: Self-pay | Admitting: Internal Medicine

## 2023-08-03 ENCOUNTER — Ambulatory Visit: Payer: Medicare HMO | Admitting: Physical Therapy

## 2023-08-03 ENCOUNTER — Other Ambulatory Visit: Payer: Self-pay

## 2023-08-03 DIAGNOSIS — M2560 Stiffness of unspecified joint, not elsewhere classified: Secondary | ICD-10-CM | POA: Diagnosis not present

## 2023-08-03 DIAGNOSIS — M6281 Muscle weakness (generalized): Secondary | ICD-10-CM

## 2023-08-03 DIAGNOSIS — M4126 Other idiopathic scoliosis, lumbar region: Secondary | ICD-10-CM | POA: Diagnosis not present

## 2023-08-03 DIAGNOSIS — J984 Other disorders of lung: Secondary | ICD-10-CM

## 2023-08-03 NOTE — Telephone Encounter (Signed)
FYI  KP

## 2023-08-05 ENCOUNTER — Ambulatory Visit: Payer: Medicare HMO | Admitting: Physical Therapy

## 2023-08-05 DIAGNOSIS — M4126 Other idiopathic scoliosis, lumbar region: Secondary | ICD-10-CM | POA: Diagnosis not present

## 2023-08-05 DIAGNOSIS — M6281 Muscle weakness (generalized): Secondary | ICD-10-CM

## 2023-08-05 DIAGNOSIS — M2560 Stiffness of unspecified joint, not elsewhere classified: Secondary | ICD-10-CM | POA: Diagnosis not present

## 2023-08-06 DIAGNOSIS — M47814 Spondylosis without myelopathy or radiculopathy, thoracic region: Secondary | ICD-10-CM | POA: Diagnosis not present

## 2023-08-06 DIAGNOSIS — Z01818 Encounter for other preprocedural examination: Secondary | ICD-10-CM | POA: Diagnosis not present

## 2023-08-06 DIAGNOSIS — M47816 Spondylosis without myelopathy or radiculopathy, lumbar region: Secondary | ICD-10-CM | POA: Diagnosis not present

## 2023-08-06 DIAGNOSIS — K5730 Diverticulosis of large intestine without perforation or abscess without bleeding: Secondary | ICD-10-CM | POA: Diagnosis not present

## 2023-08-08 ENCOUNTER — Encounter: Payer: Self-pay | Admitting: Internal Medicine

## 2023-08-10 ENCOUNTER — Ambulatory Visit: Payer: Medicare HMO | Admitting: Physical Therapy

## 2023-08-10 ENCOUNTER — Encounter: Payer: Self-pay | Admitting: Internal Medicine

## 2023-08-10 DIAGNOSIS — M6281 Muscle weakness (generalized): Secondary | ICD-10-CM | POA: Diagnosis not present

## 2023-08-10 DIAGNOSIS — M2560 Stiffness of unspecified joint, not elsewhere classified: Secondary | ICD-10-CM | POA: Diagnosis not present

## 2023-08-10 DIAGNOSIS — M4126 Other idiopathic scoliosis, lumbar region: Secondary | ICD-10-CM

## 2023-08-12 DIAGNOSIS — H26493 Other secondary cataract, bilateral: Secondary | ICD-10-CM | POA: Diagnosis not present

## 2023-08-14 NOTE — Therapy (Addendum)
OUTPATIENT PHYSICAL THERAPY THORACOLUMBAR TREATMENT   Patient Name: Jennifer Morrow MRN: [id number] DOB:1950-10-12, 72 y.o., female Today's Date: 08/03/23  END OF SESSION:  PT End of Session - 08/14/23 1347     Visit Number 2    Number of Visits 12    Date for PT Re-Evaluation 09/10/23    PT Start Time 0815    PT Stop Time 0903    PT Time Calculation (min) 48 min    Activity Tolerance Patient tolerated treatment well;Patient limited by pain    Behavior During Therapy [facility] for tasks assessed/performed             Past Medical History:  Diagnosis Date   Arthritis    Bronchitis    CHRONIC   Bulging lumbar disc    degenerative dics   Scoliosis    LIMITS LUNG FUNCTION   Spinal stenosis    Past Surgical History:  Procedure Laterality Date   CATARACT EXTRACTION W/PHACO Left 01/06/2018   Procedure: CATARACT EXTRACTION PHACO AND INTRAOCULAR LENS PLACEMENT (IOC);  Surgeon: Nevada Crane, MD;  Location: [facility] ORS;  Service: Ophthalmology;  Laterality: Left;  [place] 00:27.5 AP% 4.3 CDE 1.18 FLUID PACK LOT # [id number]   CATARACT EXTRACTION W/PHACO Right 02/10/2018   Procedure: CATARACT EXTRACTION PHACO AND INTRAOCULAR LENS PLACEMENT (IOC);  Surgeon: Nevada Crane, MD;  Location: [facility] ORS;  Service: Ophthalmology;  Laterality: Right;  [place] 00:30.5 AP% 5.0 CDE 1.51  Fluid pack lot # [id number]   FOOT X 2     HEEL SPUR EXCISION     TONSILLECTOMY     Patient Active Problem List   Diagnosis Date Noted   Elevated liver enzymes 06/14/2023   Osteoarthritis cervical spine 01/14/2022   Age-related osteoporosis without current pathological fracture 05/19/2021   Spondylosis of lumbar region without myelopathy or radiculopathy 05/19/2021   Degenerative disc disease, lumbar 05/19/2021   Scoliosis, unspecified 01/09/2015   Vitamin D deficiency 01/08/2014   Mixed hyperlipidemia 01/08/2014                PCP: Bari Edward, MD   REFERRING PROVIDER: Leana Gamer, MD   REFERRING  DIAG: M41.9 (ICD-10-CM) - Scoliosis, unspecified    Rationale for Evaluation and Treatment: Rehabilitation   THERAPY DIAG:  Other idiopathic scoliosis, lumbar region   Joint stiffness of spine   Muscle weakness (generalized)   ONSET DATE: chronic          SUBJECTIVE:  SUBJECTIVE STATEMENT: Pt. Reports chronic low back pain everyday.  Pt. Uses MH and hot tub daily to manage symptoms.  Increase pain with prolonged standing.  Pt. Returned to [place]/ [place] after a 19 day trip (07/11/23).  Pt. Planning to return to pool at [employer] 2x/week, Yoga 1x/week, personal trainer 1x/week.  Pt. Is considering major back surgery with Dr. Pam Drown (f/u 08/31/23) if bone density is improved.  Pt. Reports L LE/calf/foot N/T and weakness (no falls reported).  Pt. Wants to return to walking with trekking poles and no back symptoms.     PERTINENT HISTORY:  Pt. Well known to PT clinic.     PAIN:  Are you having pain? Yes: NPRS scale: 2/10 Pain location: low back Pain description: aching Aggravating factors: increase activity/ prolonged standing and walking Relieving factors: heat (hot tub use)   PRECAUTIONS: Back   RED FLAGS: None      WEIGHT BEARING RESTRICTIONS: No   FALLS:  Has patient fallen in last 6 months? No   LIVING ENVIRONMENT: Lives with: lives alone Lives in: House/apartment Has following equipment at home: Single point cane   OCCUPATION: Retired   PLOF: Independent   PATIENT GOALS: Increase activity during the day with less back pain.     NEXT MD VISIT: 08/31/23   OBJECTIVE:  Note: Objective measures were completed at Evaluation unless otherwise noted.   DIAGNOSTIC FINDINGS:  See imaging   PATIENT SURVEYS:  FOTO initial 37/ goal 50   COGNITION: Overall cognitive status: Within  functional limits for tasks assessed                          SENSATION: L LE N/T   MUSCLE LENGTH: Hamstrings: Right 85 deg; Left 82 deg   POSTURE: rounded shoulders and forward head.  Moderate scoliosis (L to R).     PALPATION: (+) tenderness in R low thoracic/lumbar spine (hypomobile).     LUMBAR ROM:    AROM eval  Flexion 25% limited  Extension 75% limited  Right lateral flexion 50% limited  Left lateral flexion 50% limited  Right rotation 50% limited  Left rotation 50% limited   (Blank rows = not tested)   LOWER EXTREMITY ROM:      Active  Right eval Left eval  Hip flexion 98 deg. 100 deg.  Hip extension      Hip abduction      Hip adduction      Hip internal rotation 30 deg. 30 deg.  Hip external rotation      Knee flexion [facility] WFL  Knee extension [facility]  Ankle dorsiflexion      Ankle plantarflexion      Ankle inversion      Ankle eversion       (Blank rows = not tested)   LOWER EXTREMITY MMT:     MMT Right eval Left eval  Hip flexion 3/5 3/5  Hip extension      Hip abduction 3+ 3+  Hip adduction      Hip internal rotation 3+ 3+  Hip external rotation 3+ 3+  Knee flexion 4 4  Knee extension 4 4  Ankle dorsiflexion 4 4  Ankle plantarflexion      Ankle inversion      Ankle eversion       (Blank rows = not tested)   LUMBAR SPECIAL TESTS:  FABER test: Negative   FUNCTIONAL TESTS:  5 times sit to stand: TBD  GAIT: Distance walked: in clinic Assistive device utilized: None Level of assistance: Modified independence Comments: Pt. Ambulates with consistent recip. Gait pattern with no assistive devices.  Pt. Has rounded shoulder posture with limited arm swing.  Good heel strike/ clearance.     TREATMENT DATE:  08/03/23                  Subjective:  Pt. Had appt. With Fransisca Connors yesterday for ex.  Pt. Is trying to get appt. With Pulmonologist.  Pt. Has MRI scheduled for this Friday and CT scan scheduled for next week.  No new complaints.     There.ex.:  Nustep L3 10 min. B UE/LE (use of MH to low back during Nustep)- discussed ex. Program.    Supine TrA ex.:  pelvic tilts/ marching/ hip abduction (unilateral/bilateral)/ bolster bridging/ heel slides.    Supine trunk rotn. 5x to L/R  Discussed HEP  Manual tx.:  Supine LE/lumbar stretches (generalized)- 8 min.    Seated STM to mid-thoracic/lumbar paraspinals with use of Hypervolt.                                                                                                                   PATIENT EDUCATION:  Education details: Aquatic ex./ stretches/ HEP Person educated: Patient Education method: Medical illustrator Education comprehension: verbalized understanding and returned demonstration   HOME EXERCISE PROGRAM: TrA ex. Program (page #1-3)- pain limited with bridging.  Modified with bolster to assist with less pain.     ASSESSMENT:   CLINICAL IMPRESSION: Pt. Has MRI and CT scheduled to determine if major back surgery is an options.  Pt. Presents with marked spinal ROM limitations and strength deficits.  Good understanding of TrA ex. And able to progress with hip ex./ bridging.  Moderate lumbar muscle tightness with STM/ manual stretches.  Pt. Will benefit from skilled PT services to develop HEP to increase generalized strength to improve pain-free functional mobility.       OBJECTIVE IMPAIRMENTS: Abnormal gait, decreased activity tolerance, decreased endurance, decreased mobility, difficulty walking, decreased ROM, decreased strength, impaired flexibility, improper body mechanics, postural dysfunction, and pain.    ACTIVITY LIMITATIONS: carrying, lifting, bending, standing, squatting, transfers, and locomotion level   PARTICIPATION LIMITATIONS: cleaning, laundry, driving, shopping, and community activity   PERSONAL FACTORS: Fitness and Past/current experiences are also affecting patient's functional outcome.    REHAB POTENTIAL: Good   CLINICAL  DECISION MAKING: Evolving/moderate complexity   EVALUATION COMPLEXITY: Moderate     GOALS: Goals reviewed with patient? Yes   SHORT TERM GOALS: Target date: 08/13/23   Pt. Independent with HEP to progress LE muscle strength 1/2 muscle grade to improve pain-free mobility.   Baseline: Goal status: INITIAL   LONG TERM GOALS: Target date: 09/10/23   Pt. Will increase FOTO to 50 to improve pain-free mobility.   Baseline: 37 Goal status: INITIAL   2.  Pt. Will increase lumbar AROM 25% to improve functional mobility/ household tasks.   Baseline: See above Goal status:  INITIAL   3.  Pt. Will report <5/10 back pain at worst with daily household tasks.   Baseline: 8/10 pain at worst Goal status: INITIAL   4.  Pt. Will increase B hip/LE muscle strength to 4+/5 MMT to improve standing/walking tasks.   Baseline: see above Goal status: INITIAL   PLAN:   PT FREQUENCY: 2x/week   PT DURATION: 6 weeks   PLANNED INTERVENTIONS: 97110-Therapeutic exercises, 97530- Therapeutic activity, [id number]- Neuromuscular re-education, 97535- Self Care, 16109- Manual therapy, [phone]- Gait training, [phone]- Aquatic Therapy, Patient/Family education, Balance training, Stair training, Dry Needling, Joint mobilization, Cryotherapy, and Moist heat.   PLAN FOR NEXT SESSION: Progress HEP                                                                                                                                                                            Cammie Mcgee, PT, DPT # [phone] 08/14/2023, 1:49 PM

## 2023-08-14 NOTE — Therapy (Addendum)
OUTPATIENT PHYSICAL THERAPY THORACOLUMBAR TREATMENT   Patient Name: Jennifer Morrow MRN: [id number] DOB:[birth year], 72 y.o., female Today's Date: 08/10/23  END OF SESSION:  PT End of Session - 08/14/23 1353     Visit Number 4    Number of Visits 12    Date for PT Re-Evaluation 09/10/23    PT Start Time 0803    PT Stop Time 0857    PT Time Calculation (min) 54 min    Activity Tolerance Patient tolerated treatment well;Patient limited by pain    Behavior During Therapy [facility] for tasks assessed/performed             Past Medical History:  Diagnosis Date   Arthritis    Bronchitis    CHRONIC   Bulging lumbar disc    degenerative dics   Scoliosis    LIMITS LUNG FUNCTION   Spinal stenosis    Past Surgical History:  Procedure Laterality Date   CATARACT EXTRACTION W/PHACO Left 01/06/2018   Procedure: CATARACT EXTRACTION PHACO AND INTRAOCULAR LENS PLACEMENT (IOC);  Surgeon: Nevada Crane, MD;  Location: [facility] ORS;  Service: Ophthalmology;  Laterality: Left;  [place] 00:27.5 AP% 4.3 CDE 1.18 FLUID PACK LOT # [id number]   CATARACT EXTRACTION W/PHACO Right 02/10/2018   Procedure: CATARACT EXTRACTION PHACO AND INTRAOCULAR LENS PLACEMENT (IOC);  Surgeon: Nevada Crane, MD;  Location: [facility] ORS;  Service: Ophthalmology;  Laterality: Right;  [place] 00:30.5 AP% 5.0 CDE 1.51  Fluid pack lot # [id number] H   FOOT X 2     HEEL SPUR EXCISION     TONSILLECTOMY     Patient Active Problem List   Diagnosis Date Noted   Elevated liver enzymes 06/14/2023   Osteoarthritis cervical spine 01/14/2022   Age-related osteoporosis without current pathological fracture 05/19/2021   Spondylosis of lumbar region without myelopathy or radiculopathy 05/19/2021   Degenerative disc disease, lumbar 05/19/2021   Scoliosis, unspecified 01/09/2015   Vitamin D deficiency 01/08/2014   Mixed hyperlipidemia 01/08/2014               PCP: Leana Gamer, MD  REFERRING PROVIDER: Leana Gamer, MD  REFERRING DIAG:  M41.9 (ICD-10-CM) - Scoliosis, unspecified   Rationale for Evaluation and Treatment: Rehabilitation  THERAPY DIAG:  Other idiopathic scoliosis, lumbar region  Joint stiffness of spine  Muscle weakness (generalized)  ONSET DATE: chronic                                                                                                                                                                                        SUBJECTIVE:  SUBJECTIVE STATEMENT: Pt. Reports chronic low back pain everyday.  Pt. Uses MH and hot tub daily to manage symptoms.  Increase pain with prolonged standing.  Pt. Returned to [place]/ [place] after a 19 day trip (07/11/23).  Pt. Planning to return to pool at [employer] 2x/week, Yoga 1x/week, personal trainer 1x/week.  Pt. Is considering major back surgery with Dr. Pam Drown (f/u 08/31/23) if bone density is improved.  Pt. Reports L LE/calf/foot N/T and weakness (no falls reported).  Pt. Wants to return to walking with trekking poles and no back symptoms.     PERTINENT HISTORY:  Pt. Well known to PT clinic.     PAIN:  Are you having pain? Yes: NPRS scale: 2/10 Pain location: low back Pain description: aching Aggravating factors: increase activity/ prolonged standing and walking Relieving factors: heat (hot tub use)   PRECAUTIONS: Back   RED FLAGS: None      WEIGHT BEARING RESTRICTIONS: No   FALLS:  Has patient fallen in last 6 months? No   LIVING ENVIRONMENT: Lives with: lives alone Lives in: House/apartment Has following equipment at home: Single point cane   OCCUPATION: Retired   PLOF: Independent   PATIENT GOALS: Increase activity during the day with less back pain.     NEXT MD VISIT: 08/31/23   OBJECTIVE:  Note: Objective measures were completed at Evaluation  unless otherwise noted.   DIAGNOSTIC FINDINGS:  See imaging   PATIENT SURVEYS:  FOTO initial 37/ goal 50   COGNITION: Overall cognitive status: Within functional limits for tasks assessed                          SENSATION: L LE N/T   MUSCLE LENGTH: Hamstrings: Right 85 deg; Left 82 deg   POSTURE: rounded shoulders and forward head.  Moderate scoliosis (L to R).     PALPATION: (+) tenderness in R low thoracic/lumbar spine (hypomobile).     LUMBAR ROM:    AROM eval  Flexion 25% limited  Extension 75% limited  Right lateral flexion 50% limited  Left lateral flexion 50% limited  Right rotation 50% limited  Left rotation 50% limited   (Blank rows = not tested)   LOWER EXTREMITY ROM:      Active  Right eval Left eval  Hip flexion 98 deg. 100 deg.  Hip extension      Hip abduction      Hip adduction      Hip internal rotation 30 deg. 30 deg.  Hip external rotation      Knee flexion [facility] WFL  Knee extension [facility]  Ankle dorsiflexion      Ankle plantarflexion      Ankle inversion      Ankle eversion       (Blank rows = not tested)   LOWER EXTREMITY MMT:     MMT Right eval Left eval  Hip flexion 3/5 3/5  Hip extension      Hip abduction 3+ 3+  Hip adduction      Hip internal rotation 3+ 3+  Hip external rotation 3+ 3+  Knee flexion 4 4  Knee extension 4 4  Ankle dorsiflexion 4 4  Ankle plantarflexion      Ankle inversion      Ankle eversion       (Blank rows = not tested)   LUMBAR SPECIAL TESTS:  FABER test: Negative   FUNCTIONAL TESTS:  5 times sit to stand: TBD  GAIT: Distance walked: in clinic Assistive device utilized: None Level of assistance: Modified independence Comments: Pt. Ambulates with consistent recip. Gait pattern with no assistive devices.  Pt. Has rounded shoulder posture with limited arm swing.  Good heel strike/ clearance.     TREATMENT DATE:  08/10/23                  Subjective:  Pt. Had exercise with Sherrilyn Rist (trainer)  since last PT tx. Session.  Pt. Has returned to pool ex. Yesterday with no issues reported.  Pt. Had CT scan (see results in EPIC).     There.ex.:   Nustep L4 10 min. B UE/LE (use of MH to low back during Nustep)- discussed ex. Program with Sherrilyn Rist and aquatic ex.   Standing Palloff press at [employer] (30#) 5x4 on L/R.  Standing [employer]ilus with wand: 40# 15x2.  Standing scap. Retraction 40# 15x2.    Supine TrA ball ex.: knee to chest/ SLR (alternating)/ trunk rotn./ sidelying clamshell.     Reviewed TrA ex.     Manual tx.:   Supine LE/lumbar stretches (generalized)- 6 min.     Seated STM to mid-thoracic/lumbar paraspinals with use of Hypervolt.                                                                                                                   PATIENT EDUCATION:  Education details: Aquatic ex./ stretches/ HEP Person educated: Patient Education method: Medical illustrator Education comprehension: verbalized understanding and returned demonstration   HOME EXERCISE PROGRAM: TrA ex. Program (page #1-3)- pain limited with bridging.  Modified with bolster to assist with less pain.     ASSESSMENT:   CLINICAL IMPRESSION: Pt. Presents with marked spinal ROM limitations and strength deficits.  Good understanding of TrA ex. And able to progress with supine ball ex.  Moderate lumbar muscle tightness with STM/ manual stretches.  No increase c/o pain and pt. Aware of proper posture during there.ex.  Pt. Will benefit from skilled PT services to develop HEP to increase generalized strength to improve pain-free functional mobility.       OBJECTIVE IMPAIRMENTS: Abnormal gait, decreased activity tolerance, decreased endurance, decreased mobility, difficulty walking, decreased ROM, decreased strength, impaired flexibility, improper body mechanics, postural dysfunction, and pain.    ACTIVITY LIMITATIONS: carrying, lifting, bending, standing, squatting, transfers, and locomotion level    PARTICIPATION LIMITATIONS: cleaning, laundry, driving, shopping, and community activity   PERSONAL FACTORS: Fitness and Past/current experiences are also affecting patient's functional outcome.    REHAB POTENTIAL: Good   CLINICAL DECISION MAKING: Evolving/moderate complexity   EVALUATION COMPLEXITY: Moderate     GOALS: Goals reviewed with patient? Yes   SHORT TERM GOALS: Target date: 08/13/23   Pt. Independent with HEP to progress LE muscle strength 1/2 muscle grade to improve pain-free mobility.   Baseline:  see above Goal status: On-going   LONG TERM GOALS: Target date: 09/10/23   Pt. Will increase FOTO to 50 to improve pain-free mobility.   Baseline: 37 Goal  status: INITIAL   2.  Pt. Will increase lumbar AROM 25% to improve functional mobility/ household tasks.   Baseline: See above Goal status: INITIAL   3.  Pt. Will report <5/10 back pain at worst with daily household tasks.   Baseline: 8/10 pain at worst Goal status: INITIAL   4.  Pt. Will increase B hip/LE muscle strength to 4+/5 MMT to improve standing/walking tasks.   Baseline: see above Goal status: INITIAL   PLAN:   PT FREQUENCY: 2x/week   PT DURATION: 6 weeks   PLANNED INTERVENTIONS: 97110-Therapeutic exercises, 97530- Therapeutic activity, [id number]- Neuromuscular re-education, 97535- Self Care, 91478- Manual therapy, [phone]- Gait training, [phone]- Aquatic Therapy, Patient/Family education, Balance training, Stair training, Dry Needling, Joint mobilization, Cryotherapy, and Moist heat.   PLAN FOR NEXT SESSION: Progress HEP                                                                                                                                                                             Cammie Mcgee, PT, DPT # [phone] 08/14/2023, 1:54 PM

## 2023-08-14 NOTE — Therapy (Addendum)
OUTPATIENT PHYSICAL THERAPY THORACOLUMBAR TREATMENT   Patient Name: Jennifer Morrow MRN: [id number] DOB:[birth year], 72 y.o., female Today's Date: 08/05/23  END OF SESSION:  PT End of Session - 08/14/23 1351     Visit Number 3    Number of Visits 12    Date for PT Re-Evaluation 09/10/23    PT Start Time 1728    PT Stop Time 1812    PT Time Calculation (min) 44 min    Activity Tolerance Patient tolerated treatment well;Patient limited by pain    Behavior During Therapy [facility] for tasks assessed/performed             Past Medical History:  Diagnosis Date   Arthritis    Bronchitis    CHRONIC   Bulging lumbar disc    degenerative dics   Scoliosis    LIMITS LUNG FUNCTION   Spinal stenosis    Past Surgical History:  Procedure Laterality Date   CATARACT EXTRACTION W/PHACO Left 01/06/2018   Procedure: CATARACT EXTRACTION PHACO AND INTRAOCULAR LENS PLACEMENT (IOC);  Surgeon: Nevada Crane, MD;  Location: [facility] ORS;  Service: Ophthalmology;  Laterality: Left;  [place] 00:27.5 AP% 4.3 CDE 1.18 FLUID PACK LOT # [id number]   CATARACT EXTRACTION W/PHACO Right 02/10/2018   Procedure: CATARACT EXTRACTION PHACO AND INTRAOCULAR LENS PLACEMENT (IOC);  Surgeon: Nevada Crane, MD;  Location: [facility] ORS;  Service: Ophthalmology;  Laterality: Right;  [place] 00:30.5 AP% 5.0 CDE 1.51  Fluid pack lot # [id number]   FOOT X 2     HEEL SPUR EXCISION     TONSILLECTOMY     Patient Active Problem List   Diagnosis Date Noted   Elevated liver enzymes 06/14/2023   Osteoarthritis cervical spine 01/14/2022   Age-related osteoporosis without current pathological fracture 05/19/2021   Spondylosis of lumbar region without myelopathy or radiculopathy 05/19/2021   Degenerative disc disease, lumbar 05/19/2021   Scoliosis, unspecified 01/09/2015   Vitamin D deficiency 01/08/2014   Mixed hyperlipidemia 01/08/2014    PCP: Leana Gamer, MD  REFERRING PROVIDER: Leana Gamer, MD  REFERRING DIAG: M41.9  (ICD-10-CM) - Scoliosis, unspecified   Rationale for Evaluation and Treatment: Rehabilitation  THERAPY DIAG:  Other idiopathic scoliosis, lumbar region  Joint stiffness of spine  Muscle weakness (generalized)  ONSET DATE: chronic                                                                                                                                                                                        SUBJECTIVE:  SUBJECTIVE STATEMENT: Pt. Reports chronic low back pain everyday.  Pt. Uses MH and hot tub daily to manage symptoms.  Increase pain with prolonged standing.  Pt. Returned to [place]/ [place] after a 19 day trip (07/11/23).  Pt. Planning to return to pool at [employer] 2x/week, Yoga 1x/week, personal trainer 1x/week.  Pt. Is considering major back surgery with Dr. Pam Drown (f/u 08/31/23) if bone density is improved.  Pt. Reports L LE/calf/foot N/T and weakness (no falls reported).  Pt. Wants to return to walking with trekking poles and no back symptoms.     PERTINENT HISTORY:  Pt. Well known to PT clinic.     PAIN:  Are you having pain? Yes: NPRS scale: 2/10 Pain location: low back Pain description: aching Aggravating factors: increase activity/ prolonged standing and walking Relieving factors: heat (hot tub use)   PRECAUTIONS: Back   RED FLAGS: None      WEIGHT BEARING RESTRICTIONS: No   FALLS:  Has patient fallen in last 6 months? No   LIVING ENVIRONMENT: Lives with: lives alone Lives in: House/apartment Has following equipment at home: Single point cane   OCCUPATION: Retired   PLOF: Independent   PATIENT GOALS: Increase activity during the day with less back pain.     NEXT MD VISIT: 08/31/23   OBJECTIVE:  Note: Objective measures were completed at Evaluation unless  otherwise noted.   DIAGNOSTIC FINDINGS:  See imaging   PATIENT SURVEYS:  FOTO initial 37/ goal 50   COGNITION: Overall cognitive status: Within functional limits for tasks assessed                          SENSATION: L LE N/T   MUSCLE LENGTH: Hamstrings: Right 85 deg; Left 82 deg   POSTURE: rounded shoulders and forward head.  Moderate scoliosis (L to R).     PALPATION: (+) tenderness in R low thoracic/lumbar spine (hypomobile).     LUMBAR ROM:    AROM eval  Flexion 25% limited  Extension 75% limited  Right lateral flexion 50% limited  Left lateral flexion 50% limited  Right rotation 50% limited  Left rotation 50% limited   (Blank rows = not tested)   LOWER EXTREMITY ROM:      Active  Right eval Left eval  Hip flexion 98 deg. 100 deg.  Hip extension      Hip abduction      Hip adduction      Hip internal rotation 30 deg. 30 deg.  Hip external rotation      Knee flexion [facility] WFL  Knee extension [facility]  Ankle dorsiflexion      Ankle plantarflexion      Ankle inversion      Ankle eversion       (Blank rows = not tested)   LOWER EXTREMITY MMT:     MMT Right eval Left eval  Hip flexion 3/5 3/5  Hip extension      Hip abduction 3+ 3+  Hip adduction      Hip internal rotation 3+ 3+  Hip external rotation 3+ 3+  Knee flexion 4 4  Knee extension 4 4  Ankle dorsiflexion 4 4  Ankle plantarflexion      Ankle inversion      Ankle eversion       (Blank rows = not tested)   LUMBAR SPECIAL TESTS:  FABER test: Negative   FUNCTIONAL TESTS:  5 times sit to stand: TBD  GAIT: Distance walked: in clinic Assistive device utilized: None Level of assistance: Modified independence Comments: Pt. Ambulates with consistent recip. Gait pattern with no assistive devices.  Pt. Has rounded shoulder posture with limited arm swing.  Good heel strike/ clearance.     TREATMENT DATE:  08/05/23                  Subjective:  Pt. Reports 5/10 LBP prior to tx. Session.   Pt. Went in hot tub this morning.  Pt. Has MRI tomorrow and a friend is taking pt. To [facility].  Pt. May return to pool ex. Program tomorrow.     There.ex.:   Nustep L4 10 min. B UE/LE (use of MH to low back during Nustep)- discussed aquatic ex. And upcoming MRI.      Seated blue ball ex. At //-bars (mirror feedback): marching/ LAQ/ wt. Shifting/ RTB scap. Retraction 20x.  Pt. Challenged with hip flexion and LAQ.  Fatigue noted.    Standing Palloff press RTB at //-bars   Supine trunk rotn. 5x to L/R.  Reviewed TrA ex.     Discussed HEP   Manual tx.:   Supine LE/lumbar stretches (generalized)- 7 min.     Seated STM to mid-thoracic/lumbar paraspinals with use of Hypervolt.                                                                                                                   PATIENT EDUCATION:  Education details: Aquatic ex./ stretches/ HEP Person educated: Patient Education method: Medical illustrator Education comprehension: verbalized understanding and returned demonstration   HOME EXERCISE PROGRAM: TrA ex. Program (page #1-3)- pain limited with bridging.  Modified with bolster to assist with less pain.     ASSESSMENT:   CLINICAL IMPRESSION: Pt. Presents with marked spinal ROM limitations and strength deficits.  Good understanding of TrA ex. And able to progress to seated blue ball ex.  Moderate lumbar muscle tightness with STM/ manual stretches.  Pt. Will benefit from skilled PT services to develop HEP to increase generalized strength to improve pain-free functional mobility.       OBJECTIVE IMPAIRMENTS: Abnormal gait, decreased activity tolerance, decreased endurance, decreased mobility, difficulty walking, decreased ROM, decreased strength, impaired flexibility, improper body mechanics, postural dysfunction, and pain.    ACTIVITY LIMITATIONS: carrying, lifting, bending, standing, squatting, transfers, and locomotion level   PARTICIPATION LIMITATIONS:  cleaning, laundry, driving, shopping, and community activity   PERSONAL FACTORS: Fitness and Past/current experiences are also affecting patient's functional outcome.    REHAB POTENTIAL: Good   CLINICAL DECISION MAKING: Evolving/moderate complexity   EVALUATION COMPLEXITY: Moderate     GOALS: Goals reviewed with patient? Yes   SHORT TERM GOALS: Target date: 08/13/23   Pt. Independent with HEP to progress LE muscle strength 1/2 muscle grade to improve pain-free mobility.   Baseline:  see above Goal status: INITIAL   LONG TERM GOALS: Target date: 09/10/23   Pt. Will increase FOTO to 50 to improve pain-free mobility.   Baseline:  37 Goal status: INITIAL   2.  Pt. Will increase lumbar AROM 25% to improve functional mobility/ household tasks.   Baseline: See above Goal status: INITIAL   3.  Pt. Will report <5/10 back pain at worst with daily household tasks.   Baseline: 8/10 pain at worst Goal status: INITIAL   4.  Pt. Will increase B hip/LE muscle strength to 4+/5 MMT to improve standing/walking tasks.   Baseline: see above Goal status: INITIAL   PLAN:   PT FREQUENCY: 2x/week   PT DURATION: 6 weeks   PLANNED INTERVENTIONS: 97110-Therapeutic exercises, 97530- Therapeutic activity, [id number]- Neuromuscular re-education, 97535- Self Care, 16109- Manual therapy, [phone]- Gait training, [phone]- Aquatic Therapy, Patient/Family education, Balance training, Stair training, Dry Needling, Joint mobilization, Cryotherapy, and Moist heat.   PLAN FOR NEXT SESSION: Progress HEP  Cammie Mcgee, PT, DPT # [id number] 08/14/2023, 1:52 PM

## 2023-08-17 ENCOUNTER — Encounter: Payer: Self-pay | Admitting: Physical Therapy

## 2023-08-17 ENCOUNTER — Ambulatory Visit: Payer: Medicare HMO | Admitting: Physical Therapy

## 2023-08-17 DIAGNOSIS — M810 Age-related osteoporosis without current pathological fracture: Secondary | ICD-10-CM | POA: Diagnosis not present

## 2023-08-17 DIAGNOSIS — M6281 Muscle weakness (generalized): Secondary | ICD-10-CM

## 2023-08-17 DIAGNOSIS — M2560 Stiffness of unspecified joint, not elsewhere classified: Secondary | ICD-10-CM

## 2023-08-17 DIAGNOSIS — M4126 Other idiopathic scoliosis, lumbar region: Secondary | ICD-10-CM | POA: Diagnosis not present

## 2023-08-17 NOTE — Therapy (Addendum)
 OUTPATIENT PHYSICAL THERAPY THORACOLUMBAR TREATMENT   Patient Name: Jennifer Morrow MRN: [id number] DOB:Jan 22, 1951, 72 y.o., female Today's Date: 08/17/2023  END OF SESSION:  PT End of Session - 08/17/23 1605     Visit Number 5    Number of Visits 12    Date for PT Re-Evaluation 09/10/23    PT Start Time 1515    PT Stop Time 1602    PT Time Calculation (min) 47 min    Activity Tolerance Patient tolerated treatment well;Patient limited by pain    Behavior During Therapy [facility] for tasks assessed/performed             Past Medical History:  Diagnosis Date   Arthritis    Bronchitis    CHRONIC   Bulging lumbar disc    degenerative dics   Scoliosis    LIMITS LUNG FUNCTION   Spinal stenosis    Past Surgical History:  Procedure Laterality Date   CATARACT EXTRACTION W/PHACO Left 01/06/2018   Procedure: CATARACT EXTRACTION PHACO AND INTRAOCULAR LENS PLACEMENT (IOC);  Surgeon: Myrna Adine Anes, MD;  Location: [facility] ORS;  Service: Ophthalmology;  Laterality: Left;  [place]  00:27.5 AP% 4.3 CDE 1.18 FLUID PACK LOT # [id number]   CATARACT EXTRACTION W/PHACO Right 02/10/2018   Procedure: CATARACT EXTRACTION PHACO AND INTRAOCULAR LENS PLACEMENT (IOC);  Surgeon: Myrna Adine Anes, MD;  Location: [facility] ORS;  Service: Ophthalmology;  Laterality: Right;  [place]  00:30.5 AP% 5.0 CDE 1.51  Fluid pack lot # [id number]   FOOT X 2     HEEL SPUR EXCISION     TONSILLECTOMY     Patient Active Problem List   Diagnosis Date Noted   Elevated liver enzymes 06/14/2023   Osteoarthritis cervical spine 01/14/2022   Age-related osteoporosis without current pathological fracture 05/19/2021   Spondylosis of lumbar region without myelopathy or radiculopathy 05/19/2021   Degenerative disc disease, lumbar 05/19/2021   Scoliosis, unspecified 01/09/2015   Vitamin D  deficiency 01/08/2014   Mixed hyperlipidemia 01/08/2014    PCP: Emi Bills, MD  REFERRING PROVIDER: Emi Bills, MD  REFERRING DIAG: M41.9  (ICD-10-CM) - Scoliosis, unspecified   Rationale for Evaluation and Treatment: Rehabilitation  THERAPY DIAG:  Other idiopathic scoliosis, lumbar region  Joint stiffness of spine  Muscle weakness (generalized)  ONSET DATE: chronic                                                                                                                                                                                        SUBJECTIVE:  SUBJECTIVE STATEMENT: Pt. Reports chronic low back pain everyday.  Pt. Uses MH and hot tub daily to manage symptoms.  Increase pain with prolonged standing.  Pt. Returned to [place]/ [place] after a 19 day trip (07/11/23).  Pt. Planning to return to pool at [employer] 2x/week, Yoga 1x/week, personal trainer 1x/week.  Pt. Is considering major back surgery with Dr. Emi (f/u 08/31/23) if bone density is improved.  Pt. Reports L LE/calf/foot N/T and weakness (no falls reported).  Pt. Wants to return to walking with trekking poles and no back symptoms.     PERTINENT HISTORY:  Pt. Well known to PT clinic.     PAIN:  Are you having pain? Yes: NPRS scale: 2/10 Pain location: low back Pain description: aching Aggravating factors: increase activity/ prolonged standing and walking Relieving factors: heat (hot tub use)   PRECAUTIONS: Back   RED FLAGS: None      WEIGHT BEARING RESTRICTIONS: No   FALLS:  Has patient fallen in last 6 months? No   LIVING ENVIRONMENT: Lives with: lives alone Lives in: House/apartment Has following equipment at home: Single point cane   OCCUPATION: Retired   PLOF: Independent   PATIENT GOALS: Increase activity during the day with less back pain.     NEXT MD VISIT: 08/31/23   OBJECTIVE:  Note: Objective measures were completed at Evaluation unless  otherwise noted.   DIAGNOSTIC FINDINGS:  See imaging   PATIENT SURVEYS:  FOTO initial 37/ goal 50   COGNITION: Overall cognitive status: Within functional limits for tasks assessed                          SENSATION: L LE N/T   MUSCLE LENGTH: Hamstrings: Right 85 deg; Left 82 deg   POSTURE: rounded shoulders and forward head.  Moderate scoliosis (L to R).     PALPATION: (+) tenderness in R low thoracic/lumbar spine (hypomobile).     LUMBAR ROM:    AROM eval  Flexion 25% limited  Extension 75% limited  Right lateral flexion 50% limited  Left lateral flexion 50% limited  Right rotation 50% limited  Left rotation 50% limited   (Blank rows = not tested)   LOWER EXTREMITY ROM:      Active  Right eval Left eval  Hip flexion 98 deg. 100 deg.  Hip extension      Hip abduction      Hip adduction      Hip internal rotation 30 deg. 30 deg.  Hip external rotation      Knee flexion [facility] WFL  Knee extension [facility]  Ankle dorsiflexion      Ankle plantarflexion      Ankle inversion      Ankle eversion       (Blank rows = not tested)   LOWER EXTREMITY MMT:     MMT Right eval Left eval  Hip flexion 3/5 3/5  Hip extension      Hip abduction 3+ 3+  Hip adduction      Hip internal rotation 3+ 3+  Hip external rotation 3+ 3+  Knee flexion 4 4  Knee extension 4 4  Ankle dorsiflexion 4 4  Ankle plantarflexion      Ankle inversion      Ankle eversion       (Blank rows = not tested)   LUMBAR SPECIAL TESTS:  FABER test: Negative   FUNCTIONAL TESTS:  5 times sit to stand: TBD  GAIT: Distance walked: in clinic Assistive device utilized: None Level of assistance: Modified independence Comments: Pt. Ambulates with consistent recip. Gait pattern with no assistive devices.  Pt. Has rounded shoulder posture with limited arm swing.  Good heel strike/ clearance.     TREATMENT DATE:  08/17/23                  Subjective:  Pt. Reports 5-6/10 LBP prior to tx. Session.   Pt. Went in hot tub this morning.  Pt. Scheduled for pool ex. This Friday.     There.ex.:   Nustep L4 10 min. B UE/LE (use of MH to low back during Nustep)- discussed weekend activities.     Supine white ball ex.: knee to chest/ marching/ SLR/ dead bug 20x each.     Standing Palloff press (40#) at [employer]/ 40# lat. Pull downs/ 40# scap. Retraction 12x2 each.     Supine trunk rotn. 5x to L/R (light OP).     Discussed HEP   Manual tx.:   Supine LE/lumbar stretches (generalized)- 8 min.     Seated STM to mid-thoracic/lumbar paraspinals with use of Hypervolt.                                                                                                                   PATIENT EDUCATION:  Education details: Aquatic ex./ stretches/ HEP Person educated: Patient Education method: Medical Illustrator Education comprehension: verbalized understanding and returned demonstration   HOME EXERCISE PROGRAM: TrA ex. Program (page #1-3)- pain limited with bridging.  Modified with bolster to assist with less pain.     ASSESSMENT:   CLINICAL IMPRESSION: Pt. Presents with marked spinal ROM limitations and strength deficits.  Good understanding of TrA ex. And able to progress to seated blue ball ex.  Moderate lumbar muscle tightness with STM/ manual stretches.  Pt. Will benefit from skilled PT services to develop HEP to increase generalized strength to improve pain-free functional mobility.       OBJECTIVE IMPAIRMENTS: Abnormal gait, decreased activity tolerance, decreased endurance, decreased mobility, difficulty walking, decreased ROM, decreased strength, impaired flexibility, improper body mechanics, postural dysfunction, and pain.    ACTIVITY LIMITATIONS: carrying, lifting, bending, standing, squatting, transfers, and locomotion level   PARTICIPATION LIMITATIONS: cleaning, laundry, driving, shopping, and community activity   PERSONAL FACTORS: Fitness and Past/current experiences  are also affecting patient's functional outcome.    REHAB POTENTIAL: Good   CLINICAL DECISION MAKING: Evolving/moderate complexity   EVALUATION COMPLEXITY: Moderate     GOALS: Goals reviewed with patient? Yes   SHORT TERM GOALS: Target date: 08/13/23   Pt. Independent with HEP to progress LE muscle strength 1/2 muscle grade to improve pain-free mobility.   Baseline:  see above Goal status: On-going   LONG TERM GOALS: Target date: 09/10/23   Pt. Will increase FOTO to 50 to improve pain-free mobility.   Baseline: 37 Goal status: INITIAL   2.  Pt. Will increase lumbar AROM 25% to improve functional mobility/ household tasks.  Baseline: See above Goal status: INITIAL   3.  Pt. Will report <5/10 back pain at worst with daily household tasks.   Baseline: 8/10 pain at worst Goal status: INITIAL   4.  Pt. Will increase B hip/LE muscle strength to 4+/5 MMT to improve standing/walking tasks.   Baseline: see above Goal status: INITIAL   PLAN:   PT FREQUENCY: 2x/week   PT DURATION: 6 weeks   PLANNED INTERVENTIONS: 97110-Therapeutic exercises, 97530- Therapeutic activity, [id number]- Neuromuscular re-education, 97535- Self Care, [id number]- Manual therapy, [phone]- Gait training, [phone]- Aquatic Therapy, Patient/Family education, Balance training, Stair training, Dry Needling, Joint mobilization, Cryotherapy, and Moist heat.   PLAN FOR NEXT SESSION: Progress HEP                                                                                                                                                                                        Ozell JAYSON Sero, PT, DPT # [phone] 08/17/2023, 4:07 PM

## 2023-08-19 ENCOUNTER — Ambulatory Visit: Payer: Medicare HMO | Attending: Spine Surgery | Admitting: Physical Therapy

## 2023-08-19 DIAGNOSIS — M2560 Stiffness of unspecified joint, not elsewhere classified: Secondary | ICD-10-CM | POA: Diagnosis not present

## 2023-08-19 DIAGNOSIS — M4126 Other idiopathic scoliosis, lumbar region: Secondary | ICD-10-CM | POA: Insufficient documentation

## 2023-08-19 DIAGNOSIS — M6281 Muscle weakness (generalized): Secondary | ICD-10-CM | POA: Insufficient documentation

## 2023-08-23 ENCOUNTER — Encounter: Payer: Self-pay | Admitting: Internal Medicine

## 2023-08-24 ENCOUNTER — Ambulatory Visit: Payer: Medicare HMO | Admitting: Physical Therapy

## 2023-08-24 DIAGNOSIS — M47812 Spondylosis without myelopathy or radiculopathy, cervical region: Secondary | ICD-10-CM | POA: Diagnosis not present

## 2023-08-24 DIAGNOSIS — M2560 Stiffness of unspecified joint, not elsewhere classified: Secondary | ICD-10-CM | POA: Diagnosis not present

## 2023-08-24 DIAGNOSIS — M4126 Other idiopathic scoliosis, lumbar region: Secondary | ICD-10-CM

## 2023-08-24 DIAGNOSIS — M6281 Muscle weakness (generalized): Secondary | ICD-10-CM

## 2023-08-24 DIAGNOSIS — M419 Scoliosis, unspecified: Secondary | ICD-10-CM | POA: Diagnosis not present

## 2023-08-24 DIAGNOSIS — M4802 Spinal stenosis, cervical region: Secondary | ICD-10-CM | POA: Diagnosis not present

## 2023-08-24 DIAGNOSIS — M9931 Osseous stenosis of neural canal of cervical region: Secondary | ICD-10-CM | POA: Diagnosis not present

## 2023-08-26 ENCOUNTER — Ambulatory Visit: Payer: Medicare HMO | Admitting: Physical Therapy

## 2023-08-26 DIAGNOSIS — M4126 Other idiopathic scoliosis, lumbar region: Secondary | ICD-10-CM | POA: Diagnosis not present

## 2023-08-26 DIAGNOSIS — M2560 Stiffness of unspecified joint, not elsewhere classified: Secondary | ICD-10-CM

## 2023-08-26 DIAGNOSIS — M6281 Muscle weakness (generalized): Secondary | ICD-10-CM

## 2023-08-28 NOTE — Therapy (Addendum)
 OUTPATIENT PHYSICAL THERAPY THORACOLUMBAR TREATMENT  Patient Name: Jennifer Morrow MRN: [id number] DOB:11-18-1950, 73 y.o., female Today's Date: 08/19/2023  END OF SESSION:  PT End of Session - 08/28/23 1645     Visit Number 6    Number of Visits 12    Date for PT Re-Evaluation 09/10/23    PT Start Time 1729    PT Stop Time 1816    PT Time Calculation (min) 47 min    Activity Tolerance Patient tolerated treatment well;Patient limited by pain    Behavior During Therapy [facility] for tasks assessed/performed             Past Medical History:  Diagnosis Date   Arthritis    Bronchitis    CHRONIC   Bulging lumbar disc    degenerative dics   Scoliosis    LIMITS LUNG FUNCTION   Spinal stenosis    Past Surgical History:  Procedure Laterality Date   CATARACT EXTRACTION W/PHACO Left 01/06/2018   Procedure: CATARACT EXTRACTION PHACO AND INTRAOCULAR LENS PLACEMENT (IOC);  Surgeon: Myrna Adine Anes, MD;  Location: [facility] ORS;  Service: Ophthalmology;  Laterality: Left;  US  00:27.5 AP% 4.3 CDE 1.18 FLUID PACK LOT # [id number]   CATARACT EXTRACTION W/PHACO Right 02/10/2018   Procedure: CATARACT EXTRACTION PHACO AND INTRAOCULAR LENS PLACEMENT (IOC);  Surgeon: Myrna Adine Anes, MD;  Location: [facility] ORS;  Service: Ophthalmology;  Laterality: Right;  US  00:30.5 AP% 5.0 CDE 1.51  Fluid pack lot # [id number]   FOOT X 2     HEEL SPUR EXCISION     TONSILLECTOMY     Patient Active Problem List   Diagnosis Date Noted   Elevated liver enzymes 06/14/2023   Osteoarthritis cervical spine 01/14/2022   Age-related osteoporosis without current pathological fracture 05/19/2021   Spondylosis of lumbar region without myelopathy or radiculopathy 05/19/2021   Degenerative disc disease, lumbar 05/19/2021   Scoliosis, unspecified 01/09/2015   Vitamin D  deficiency 01/08/2014   Mixed hyperlipidemia 01/08/2014   PCP: Emi Bills, MD  REFERRING PROVIDER: Emi Bills, MD  REFERRING DIAG: M41.9  (ICD-10-CM) - Scoliosis, unspecified   Rationale for Evaluation and Treatment: Rehabilitation  THERAPY DIAG:  Other idiopathic scoliosis, lumbar region  Joint stiffness of spine  Muscle weakness (generalized)  ONSET DATE: chronic                                                                                                                                                                                        SUBJECTIVE:  SUBJECTIVE STATEMENT: Pt. Reports chronic low back pain everyday.  Pt. Uses MH and hot tub daily to manage symptoms.  Increase pain with prolonged standing.  Pt. Returned to [place]/ [place] after a 19 day trip (07/11/23).  Pt. Planning to return to pool at [employer] 2x/week, Yoga 1x/week, personal trainer 1x/week.  Pt. Is considering major back surgery with Dr. Emi (f/u 08/31/23) if bone density is improved.  Pt. Reports L LE/calf/foot N/T and weakness (no falls reported).  Pt. Wants to return to walking with trekking poles and no back symptoms.     PERTINENT HISTORY:  Pt. Well known to PT clinic.     PAIN:  Are you having pain? Yes: NPRS scale: 2/10 Pain location: low back Pain description: aching Aggravating factors: increase activity/ prolonged standing and walking Relieving factors: heat (hot tub use)   PRECAUTIONS: Back   RED FLAGS: None      WEIGHT BEARING RESTRICTIONS: No   FALLS:  Has patient fallen in last 6 months? No   LIVING ENVIRONMENT: Lives with: lives alone Lives in: House/apartment Has following equipment at home: Single point cane   OCCUPATION: Retired   PLOF: Independent   PATIENT GOALS: Increase activity during the day with less back pain.     NEXT MD VISIT: 08/31/23   OBJECTIVE:  Note: Objective measures were completed at Evaluation unless  otherwise noted.   DIAGNOSTIC FINDINGS:  See imaging   PATIENT SURVEYS:  FOTO initial 37/ goal 50   COGNITION: Overall cognitive status: Within functional limits for tasks assessed                          SENSATION: L LE N/T   MUSCLE LENGTH: Hamstrings: Right 85 deg; Left 82 deg   POSTURE: rounded shoulders and forward head.  Moderate scoliosis (L to R).     PALPATION: (+) tenderness in R low thoracic/lumbar spine (hypomobile).     LUMBAR ROM:    AROM eval  Flexion 25% limited  Extension 75% limited  Right lateral flexion 50% limited  Left lateral flexion 50% limited  Right rotation 50% limited  Left rotation 50% limited   (Blank rows = not tested)   LOWER EXTREMITY ROM:      Active  Right eval Left eval  Hip flexion 98 deg. 100 deg.  Hip extension      Hip abduction      Hip adduction      Hip internal rotation 30 deg. 30 deg.  Hip external rotation      Knee flexion [facility] WFL  Knee extension [facility]  Ankle dorsiflexion      Ankle plantarflexion      Ankle inversion      Ankle eversion       (Blank rows = not tested)   LOWER EXTREMITY MMT:     MMT Right eval Left eval  Hip flexion 3/5 3/5  Hip extension      Hip abduction 3+ 3+  Hip adduction      Hip internal rotation 3+ 3+  Hip external rotation 3+ 3+  Knee flexion 4 4  Knee extension 4 4  Ankle dorsiflexion 4 4  Ankle plantarflexion      Ankle inversion      Ankle eversion       (Blank rows = not tested)   LUMBAR SPECIAL TESTS:  FABER test: Negative   FUNCTIONAL TESTS:  5 times sit to stand: TBD  GAIT: Distance walked: in clinic Assistive device utilized: None Level of assistance: Modified independence Comments: Pt. Ambulates with consistent recip. Gait pattern with no assistive devices.  Pt. Has rounded shoulder posture with limited arm swing.  Good heel strike/ clearance.     TREATMENT DATE:  08/19/23                  Subjective:  Pt. Reports 5/10 LBP prior to tx. Session.  Pt.  Reports no new complaints and remains active with daily tasks.  Pt. Is aware of overdoing it with daily tasks/ going out of house.     There.ex.:   Nustep L4 10 min. B UE/LE (use of MH to low back during Nustep)- discussed aquatic ex. And upcoming MRI.      Seated blue ball ex. At //-bars (mirror feedback): marching/ LAQ/ wt. Shifting/ RTB scap. Retraction 20x.  Pt. Challenged with hip flexion and LAQ.  Fatigue noted.    Supine trunk rotn. 5x to L/R.  Dead bug 20x/ red bolster bridging 10x2.    Reassessment of B LE muscle strength: 5/5 MMT.  .     Discussed HEP   Manual tx.:   Supine LE/lumbar stretches (generalized)- 8 min.     Seated STM to mid-thoracic/lumbar paraspinals with use of Hypervolt.                                                                                                                   PATIENT EDUCATION:  Education details: Aquatic ex./ stretches/ HEP Person educated: Patient Education method: Medical Illustrator Education comprehension: verbalized understanding and returned demonstration   HOME EXERCISE PROGRAM: TrA ex. Program (page #1-3)- pain limited with bridging.  Modified with bolster to assist with less pain.     ASSESSMENT:   CLINICAL IMPRESSION: Pt. Presents with marked spinal ROM limitations and strength deficits.  Good understanding of TrA ex. And able to progress to seated blue ball ex with less overall fatigue.  Moderate lumbar muscle tightness with STM/ manual stretches.  Pt. Tolerates STM and use of Hypervolt in seated posture to paraspinals.  Pt. Will benefit from skilled PT services to develop HEP to increase generalized strength to improve pain-free functional mobility.       OBJECTIVE IMPAIRMENTS: Abnormal gait, decreased activity tolerance, decreased endurance, decreased mobility, difficulty walking, decreased ROM, decreased strength, impaired flexibility, improper body mechanics, postural dysfunction, and pain.    ACTIVITY  LIMITATIONS: carrying, lifting, bending, standing, squatting, transfers, and locomotion level   PARTICIPATION LIMITATIONS: cleaning, laundry, driving, shopping, and community activity   PERSONAL FACTORS: Fitness and Past/current experiences are also affecting patient's functional outcome.    REHAB POTENTIAL: Good   CLINICAL DECISION MAKING: Evolving/moderate complexity   EVALUATION COMPLEXITY: Moderate     GOALS: Goals reviewed with patient? Yes   SHORT TERM GOALS: Target date: 08/13/23   Pt. Independent with HEP to progress LE muscle strength 1/2 muscle grade to improve pain-free mobility.   Baseline:  see above Goal status: On-going  LONG TERM GOALS: Target date: 09/10/23   Pt. Will increase FOTO to 50 to improve pain-free mobility.   Baseline: 37 Goal status: INITIAL   2.  Pt. Will increase lumbar AROM 25% to improve functional mobility/ household tasks.   Baseline: See above Goal status: INITIAL   3.  Pt. Will report <5/10 back pain at worst with daily household tasks.   Baseline: 8/10 pain at worst Goal status: INITIAL   4.  Pt. Will increase B hip/LE muscle strength to 4+/5 MMT to improve standing/walking tasks.   Baseline: see above Goal status: INITIAL   PLAN:   PT FREQUENCY: 2x/week   PT DURATION: 6 weeks   PLANNED INTERVENTIONS: 97110-Therapeutic exercises, 97530- Therapeutic activity, [id number]- Neuromuscular re-education, 97535- Self Care, [id number]- Manual therapy, [phone]- Gait training, [phone]- Aquatic Therapy, Patient/Family education, Balance training, Stair training, Dry Needling, Joint mobilization, Cryotherapy, and Moist heat.   PLAN FOR NEXT SESSION: Progress HEP/ discuss upcoming MD f/u                                                                                                                                                                                        Ozell JAYSON Sero, PT, DPT # [phone] 08/28/2023, 4:46 PM

## 2023-08-28 NOTE — Therapy (Addendum)
 OUTPATIENT PHYSICAL THERAPY THORACOLUMBAR TREATMENT   Patient Name: Jennifer Morrow MRN: [id number] DOB:1951-01-10, 73 y.o., female Today's Date: 08/24/2023  END OF SESSION:  PT End of Session - 08/28/23 1647     Visit Number 7    Number of Visits 12    Date for PT Re-Evaluation 09/10/23    PT Start Time 0818    PT Stop Time 0903    PT Time Calculation (min) 45 min    Activity Tolerance Patient tolerated treatment well;Patient limited by pain    Behavior During Therapy [facility] for tasks assessed/performed             Past Medical History:  Diagnosis Date   Arthritis    Bronchitis    CHRONIC   Bulging lumbar disc    degenerative dics   Scoliosis    LIMITS LUNG FUNCTION   Spinal stenosis    Past Surgical History:  Procedure Laterality Date   CATARACT EXTRACTION W/PHACO Left 01/06/2018   Procedure: CATARACT EXTRACTION PHACO AND INTRAOCULAR LENS PLACEMENT (IOC);  Surgeon: Myrna Adine Anes, MD;  Location: [facility] ORS;  Service: Ophthalmology;  Laterality: Left;  US  00:27.5 AP% 4.3 CDE 1.18 FLUID PACK LOT # [id number]   CATARACT EXTRACTION W/PHACO Right 02/10/2018   Procedure: CATARACT EXTRACTION PHACO AND INTRAOCULAR LENS PLACEMENT (IOC);  Surgeon: Myrna Adine Anes, MD;  Location: [facility] ORS;  Service: Ophthalmology;  Laterality: Right;  US  00:30.5 AP% 5.0 CDE 1.51  Fluid pack lot # [id number] H   FOOT X 2     HEEL SPUR EXCISION     TONSILLECTOMY     Patient Active Problem List   Diagnosis Date Noted   Elevated liver enzymes 06/14/2023   Osteoarthritis cervical spine 01/14/2022   Age-related osteoporosis without current pathological fracture 05/19/2021   Spondylosis of lumbar region without myelopathy or radiculopathy 05/19/2021   Degenerative disc disease, lumbar 05/19/2021   Scoliosis, unspecified 01/09/2015   Vitamin D  deficiency 01/08/2014   Mixed hyperlipidemia 01/08/2014    PCP: Emi Bills, MD  REFERRING PROVIDER: Emi Bills, MD  REFERRING DIAG: M41.9  (ICD-10-CM) - Scoliosis, unspecified   Rationale for Evaluation and Treatment: Rehabilitation  THERAPY DIAG:  Other idiopathic scoliosis, lumbar region  Joint stiffness of spine  Muscle weakness (generalized)  ONSET DATE: chronic                                                                                                                                                                                        SUBJECTIVE:  SUBJECTIVE STATEMENT: Pt. Reports chronic low back pain everyday.  Pt. Uses MH and hot tub daily to manage symptoms.  Increase pain with prolonged standing.  Pt. Returned to [place] after a 19 day trip (07/11/23).  Pt. Planning to return to pool at [employer] 2x/week, Yoga 1x/week, personal trainer 1x/week.  Pt. Is considering major back surgery with Dr. Emi (f/u 08/31/23) if bone density is improved.  Pt. Reports L LE/calf/foot N/T and weakness (no falls reported).  Pt. Wants to return to walking with trekking poles and no back symptoms.     PERTINENT HISTORY:  Pt. Well known to PT clinic.     PAIN:  Are you having pain? Yes: NPRS scale: 2/10 Pain location: low back Pain description: aching Aggravating factors: increase activity/ prolonged standing and walking Relieving factors: heat (hot tub use)   PRECAUTIONS: Back   RED FLAGS: None      WEIGHT BEARING RESTRICTIONS: No   FALLS:  Has patient fallen in last 6 months? No   LIVING ENVIRONMENT: Lives with: lives alone Lives in: House/apartment Has following equipment at home: Single point cane   OCCUPATION: Retired   PLOF: Independent   PATIENT GOALS: Increase activity during the day with less back pain.     NEXT MD VISIT: 08/31/23   OBJECTIVE:  Note: Objective measures were completed at Evaluation unless  otherwise noted.   DIAGNOSTIC FINDINGS:  See imaging   PATIENT SURVEYS:  FOTO initial 37/ goal 50   COGNITION: Overall cognitive status: Within functional limits for tasks assessed                          SENSATION: L LE N/T   MUSCLE LENGTH: Hamstrings: Right 85 deg; Left 82 deg   POSTURE: rounded shoulders and forward head.  Moderate scoliosis (L to R).     PALPATION: (+) tenderness in R low thoracic/lumbar spine (hypomobile).     LUMBAR ROM:    AROM eval  Flexion 25% limited  Extension 75% limited  Right lateral flexion 50% limited  Left lateral flexion 50% limited  Right rotation 50% limited  Left rotation 50% limited   (Blank rows = not tested)   LOWER EXTREMITY ROM:      Active  Right eval Left eval  Hip flexion 98 deg. 100 deg.  Hip extension      Hip abduction      Hip adduction      Hip internal rotation 30 deg. 30 deg.  Hip external rotation      Knee flexion [facility] WFL  Knee extension [facility]  Ankle dorsiflexion      Ankle plantarflexion      Ankle inversion      Ankle eversion       (Blank rows = not tested)   LOWER EXTREMITY MMT:     MMT Right eval Left eval  Hip flexion 3/5 3/5  Hip extension      Hip abduction 3+ 3+  Hip adduction      Hip internal rotation 3+ 3+  Hip external rotation 3+ 3+  Knee flexion 4 4  Knee extension 4 4  Ankle dorsiflexion 4 4  Ankle plantarflexion      Ankle inversion      Ankle eversion       (Blank rows = not tested)   LUMBAR SPECIAL TESTS:  FABER test: Negative   FUNCTIONAL TESTS:  5 times sit to stand: TBD  GAIT: Distance walked: in clinic Assistive device utilized: None Level of assistance: Modified independence Comments: Pt. Ambulates with consistent recip. Gait pattern with no assistive devices.  Pt. Has rounded shoulder posture with limited arm swing.  Good heel strike/ clearance.    B LE muscle strength: 5/5 MMT.    TREATMENT DATE:  08/24/23                  Subjective:  Pt. Reports  2/10 LBP prior to tx. Session.  Pt. Reports no new complaints and remains active with daily tasks.  Pt. Did not go to pool yesterday.     There.ex.:   Nustep L4 10 min. B UE/LE (use of MH to low back during Nustep)- discussed daily activities.       Supine trunk rotn. 5x to L/R.  Dead bug 20x/ red bolster bridging 10x2.  Supine SLR 10x2 (cuing for pelvic position).  Supine RTB hip abduction/ marching 20x.    Sit to stands from mat table  Seated Nautilus: 40# lat. Pull downs 20x.     Discussed HEP   Manual tx.:   Supine LE/lumbar stretches (generalized)- 9 min.     Seated STM to mid-thoracic/lumbar paraspinals with use of Hypervolt.               Not Today Seated blue ball ex. At //-bars (mirror feedback): marching/ LAQ/ wt. Shifting/ RTB scap. Retraction 20x.  Pt. Challenged with hip flexion and LAQ.  Fatigue noted.                                                                                                      PATIENT EDUCATION:  Education details: Aquatic ex./ stretches/ HEP Person educated: Patient Education method: Medical Illustrator Education comprehension: verbalized understanding and returned demonstration   HOME EXERCISE PROGRAM: TrA ex. Program (page #1-3)- pain limited with bridging.  Modified with bolster to assist with less pain.     ASSESSMENT:   CLINICAL IMPRESSION: Pt. Presents with marked spinal ROM limitations and strength deficits.  Good understanding of TrA ex. And able to progress to seated blue ball ex with less overall fatigue.  Moderate lumbar muscle tightness with STM/ manual stretches.  Pt. Tolerates STM and use of Hypervolt in seated posture to paraspinals.  Pt. Will benefit from skilled PT services to develop HEP to increase generalized strength to improve pain-free functional mobility.       OBJECTIVE IMPAIRMENTS: Abnormal gait, decreased activity tolerance, decreased endurance, decreased mobility, difficulty walking, decreased ROM,  decreased strength, impaired flexibility, improper body mechanics, postural dysfunction, and pain.    ACTIVITY LIMITATIONS: carrying, lifting, bending, standing, squatting, transfers, and locomotion level   PARTICIPATION LIMITATIONS: cleaning, laundry, driving, shopping, and community activity   PERSONAL FACTORS: Fitness and Past/current experiences are also affecting patient's functional outcome.    REHAB POTENTIAL: Good   CLINICAL DECISION MAKING: Evolving/moderate complexity   EVALUATION COMPLEXITY: Moderate     GOALS: Goals reviewed with patient? Yes   SHORT TERM GOALS: Target date: 08/13/23   Pt. Independent with HEP to progress LE  muscle strength 1/2 muscle grade to improve pain-free mobility.   Baseline:  see above Goal status: On-going   LONG TERM GOALS: Target date: 09/10/23   Pt. Will increase FOTO to 50 to improve pain-free mobility.   Baseline: 37 Goal status: INITIAL   2.  Pt. Will increase lumbar AROM 25% to improve functional mobility/ household tasks.   Baseline: See above Goal status: INITIAL   3.  Pt. Will report <5/10 back pain at worst with daily household tasks.   Baseline: 8/10 pain at worst Goal status: INITIAL   4.  Pt. Will increase B hip/LE muscle strength to 4+/5 MMT to improve standing/walking tasks.   Baseline: see above Goal status: INITIAL   PLAN:   PT FREQUENCY: 2x/week   PT DURATION: 6 weeks   PLANNED INTERVENTIONS: 97110-Therapeutic exercises, 97530- Therapeutic activity, [id number]- Neuromuscular re-education, 97535- Self Care, 02859- Manual therapy, [phone]- Gait training, [phone]- Aquatic Therapy, Patient/Family education, Balance training, Stair training, Dry Needling, Joint mobilization, Cryotherapy, and Moist heat.   PLAN FOR NEXT SESSION: Progress HEP  Ozell JAYSON Sero, PT, DPT # [phone] 08/28/2023, 4:48 PM

## 2023-08-28 NOTE — Therapy (Addendum)
 OUTPATIENT PHYSICAL THERAPY THORACOLUMBAR TREATMENT   Patient Name: Jennifer Morrow MRN: [id number] DOB:07-02-1951, 73 y.o., female Today's Date: 08/26/2023  END OF SESSION:  PT End of Session - 08/28/23 1649     Visit Number 8    Number of Visits 12    Date for PT Re-Evaluation 09/10/23    PT Start Time 0903    PT Stop Time 0949    PT Time Calculation (min) 46 min    Activity Tolerance Patient tolerated treatment well;Patient limited by pain    Behavior During Therapy [facility] for tasks assessed/performed            Past Medical History:  Diagnosis Date   Arthritis    Bronchitis    CHRONIC   Bulging lumbar disc    degenerative dics   Scoliosis    LIMITS LUNG FUNCTION   Spinal stenosis    Past Surgical History:  Procedure Laterality Date   CATARACT EXTRACTION W/PHACO Left 01/06/2018   Procedure: CATARACT EXTRACTION PHACO AND INTRAOCULAR LENS PLACEMENT (IOC);  Surgeon: Myrna Adine Anes, MD;  Location: [facility] ORS;  Service: Ophthalmology;  Laterality: Left;  US  00:27.5 AP% 4.3 CDE 1.18 FLUID PACK LOT # [id number]   CATARACT EXTRACTION W/PHACO Right 02/10/2018   Procedure: CATARACT EXTRACTION PHACO AND INTRAOCULAR LENS PLACEMENT (IOC);  Surgeon: Myrna Adine Anes, MD;  Location: [facility] ORS;  Service: Ophthalmology;  Laterality: Right;  US  00:30.5 AP% 5.0 CDE 1.51  Fluid pack lot # [id number]   FOOT X 2     HEEL SPUR EXCISION     TONSILLECTOMY     Patient Active Problem List   Diagnosis Date Noted   Elevated liver enzymes 06/14/2023   Osteoarthritis cervical spine 01/14/2022   Age-related osteoporosis without current pathological fracture 05/19/2021   Spondylosis of lumbar region without myelopathy or radiculopathy 05/19/2021   Degenerative disc disease, lumbar 05/19/2021   Scoliosis, unspecified 01/09/2015   Vitamin D  deficiency 01/08/2014   Mixed hyperlipidemia 01/08/2014    PCP: Emi Bills, MD  REFERRING PROVIDER: Emi Bills, MD  REFERRING DIAG: M41.9  (ICD-10-CM) - Scoliosis, unspecified   Rationale for Evaluation and Treatment: Rehabilitation  THERAPY DIAG:  Other idiopathic scoliosis, lumbar region  Joint stiffness of spine  Muscle weakness (generalized)  ONSET DATE: chronic                                                                                                                                                                                        SUBJECTIVE:  SUBJECTIVE STATEMENT: Pt. Reports chronic low back pain everyday.  Pt. Uses MH and hot tub daily to manage symptoms.  Increase pain with prolonged standing.  Pt. Returned to [place]/ [place] after a 19 day trip (07/11/23).  Pt. Planning to return to pool at [employer] 2x/week, Yoga 1x/week, personal trainer 1x/week.  Pt. Is considering major back surgery with Dr. Emi (f/u 08/31/23) if bone density is improved.  Pt. Reports L LE/calf/foot N/T and weakness (no falls reported).  Pt. Wants to return to walking with trekking poles and no back symptoms.     PERTINENT HISTORY:  Pt. Well known to PT clinic.     PAIN:  Are you having pain? Yes: NPRS scale: 2/10 Pain location: low back Pain description: aching Aggravating factors: increase activity/ prolonged standing and walking Relieving factors: heat (hot tub use)   PRECAUTIONS: Back   RED FLAGS: None      WEIGHT BEARING RESTRICTIONS: No   FALLS:  Has patient fallen in last 6 months? No   LIVING ENVIRONMENT: Lives with: lives alone Lives in: House/apartment Has following equipment at home: Single point cane   OCCUPATION: Retired   PLOF: Independent   PATIENT GOALS: Increase activity during the day with less back pain.     NEXT MD VISIT: 08/31/23   OBJECTIVE:  Note: Objective measures were completed at Evaluation unless  otherwise noted.   DIAGNOSTIC FINDINGS:  See imaging   PATIENT SURVEYS:  FOTO initial 37/ goal 50   COGNITION: Overall cognitive status: Within functional limits for tasks assessed                          SENSATION: L LE N/T   MUSCLE LENGTH: Hamstrings: Right 85 deg; Left 82 deg   POSTURE: rounded shoulders and forward head.  Moderate scoliosis (L to R).     PALPATION: (+) tenderness in R low thoracic/lumbar spine (hypomobile).     LUMBAR ROM:    AROM eval  Flexion 25% limited  Extension 75% limited  Right lateral flexion 50% limited  Left lateral flexion 50% limited  Right rotation 50% limited  Left rotation 50% limited   (Blank rows = not tested)   LOWER EXTREMITY ROM:      Active  Right eval Left eval  Hip flexion 98 deg. 100 deg.  Hip extension      Hip abduction      Hip adduction      Hip internal rotation 30 deg. 30 deg.  Hip external rotation      Knee flexion [facility] WFL  Knee extension [facility]  Ankle dorsiflexion      Ankle plantarflexion      Ankle inversion      Ankle eversion       (Blank rows = not tested)   LOWER EXTREMITY MMT:     MMT Right eval Left eval  Hip flexion 3/5 3/5  Hip extension      Hip abduction 3+ 3+  Hip adduction      Hip internal rotation 3+ 3+  Hip external rotation 3+ 3+  Knee flexion 4 4  Knee extension 4 4  Ankle dorsiflexion 4 4  Ankle plantarflexion      Ankle inversion      Ankle eversion       (Blank rows = not tested)   LUMBAR SPECIAL TESTS:  FABER test: Negative   FUNCTIONAL TESTS:  5 times sit to stand: TBD  GAIT: Distance walked: in clinic Assistive device utilized: None Level of assistance: Modified independence Comments: Pt. Ambulates with consistent recip. Gait pattern with no assistive devices.  Pt. Has rounded shoulder posture with limited arm swing.  Good heel strike/ clearance.    B LE muscle strength: 5/5 MMT.    TREATMENT DATE:  08/26/23                  Subjective:  Pt. Reports  no new complaints and remains active with daily tasks.  Pt. Was active with several tasks yesterday and had increase discomfort in back last night.     There.ex.:   Nustep L4 10 min. B UE/LE (use of MH to low back during Nustep)- discussed yesterday activities.       Sit to stands from chair with 2 Airex pads to 1 Airex pad (no UE assist).    Seated alt. UE/LE marching with upright posture.    Standing GTB marching/ hip abduction in //-bars with mirror feedback (20x).  Standing GTB scap. Retraction/ ER (W)/ L/R rotn 20x each.     Discussed HEP   Manual tx.:   Supine LE/lumbar stretches (generalized)- 9 min.     Seated STM to mid-thoracic/lumbar paraspinals with use of Hypervolt.               Not Today Seated blue ball ex. At //-bars (mirror feedback): marching/ LAQ/ wt. Shifting/ RTB scap. Retraction 20x.  Pt. Challenged with hip flexion and LAQ.  Fatigue noted.                           Supine trunk rotn. 5x to L/R.  Dead bug 20x/ red bolster bridging 10x2.  Supine SLR 10x2 (cuing for pelvic position).  Supine RTB hip abduction/ marching 20x.                                                                               PATIENT EDUCATION:  Education details: Aquatic ex./ stretches/ HEP Person educated: Patient Education method: Medical Illustrator Education comprehension: verbalized understanding and returned demonstration   HOME EXERCISE PROGRAM: TrA ex. Program (page #1-3)- pain limited with bridging.  Modified with bolster to assist with less pain.     ASSESSMENT:   CLINICAL IMPRESSION: Pt. Presents with marked spinal ROM limitations and strength deficits.  Good understanding of TrA ex. And able to progress to seated blue ball ex with less overall fatigue.  Moderate lumbar muscle tightness with STM/ manual stretches.  Pt. Tolerates STM and use of Hypervolt in seated posture to paraspinals.  Pt. Will benefit from skilled PT services to develop HEP to increase  generalized strength to improve pain-free functional mobility.       OBJECTIVE IMPAIRMENTS: Abnormal gait, decreased activity tolerance, decreased endurance, decreased mobility, difficulty walking, decreased ROM, decreased strength, impaired flexibility, improper body mechanics, postural dysfunction, and pain.    ACTIVITY LIMITATIONS: carrying, lifting, bending, standing, squatting, transfers, and locomotion level   PARTICIPATION LIMITATIONS: cleaning, laundry, driving, shopping, and community activity   PERSONAL FACTORS: Fitness and Past/current experiences are also affecting patient's functional outcome.    REHAB POTENTIAL: Good   CLINICAL  DECISION MAKING: Evolving/moderate complexity   EVALUATION COMPLEXITY: Moderate     GOALS: Goals reviewed with patient? Yes   SHORT TERM GOALS: Target date: 08/13/23   Pt. Independent with HEP to progress LE muscle strength 1/2 muscle grade to improve pain-free mobility.   Baseline:  see above Goal status: On-going   LONG TERM GOALS: Target date: 09/10/23   Pt. Will increase FOTO to 50 to improve pain-free mobility.   Baseline: 37 Goal status: INITIAL   2.  Pt. Will increase lumbar AROM 25% to improve functional mobility/ household tasks.   Baseline: See above Goal status: INITIAL   3.  Pt. Will report <5/10 back pain at worst with daily household tasks.   Baseline: 8/10 pain at worst Goal status: INITIAL   4.  Pt. Will increase B hip/LE muscle strength to 4+/5 MMT to improve standing/walking tasks.   Baseline: see above Goal status: INITIAL   PLAN:   PT FREQUENCY: 2x/week   PT DURATION: 6 weeks   PLANNED INTERVENTIONS: 97110-Therapeutic exercises, 97530- Therapeutic activity, [id number]- Neuromuscular re-education, 97535- Self Care, [id number]- Manual therapy, [phone]- Gait training, [phone]- Aquatic Therapy, Patient/Family education, Balance training, Stair training, Dry Needling, Joint mobilization, Cryotherapy, and Moist heat.   PLAN FOR  NEXT SESSION: Progress HEP  Ozell JAYSON Sero, PT, DPT # [phone] 08/28/2023, 4:51 PM

## 2023-08-31 ENCOUNTER — Ambulatory Visit: Payer: Medicare HMO | Admitting: Physical Therapy

## 2023-08-31 DIAGNOSIS — M4155 Other secondary scoliosis, thoracolumbar region: Secondary | ICD-10-CM | POA: Diagnosis not present

## 2023-08-31 DIAGNOSIS — M2560 Stiffness of unspecified joint, not elsewhere classified: Secondary | ICD-10-CM

## 2023-08-31 DIAGNOSIS — M4126 Other idiopathic scoliosis, lumbar region: Secondary | ICD-10-CM

## 2023-08-31 DIAGNOSIS — M4150 Other secondary scoliosis, site unspecified: Secondary | ICD-10-CM | POA: Diagnosis not present

## 2023-08-31 DIAGNOSIS — M48061 Spinal stenosis, lumbar region without neurogenic claudication: Secondary | ICD-10-CM | POA: Diagnosis not present

## 2023-08-31 DIAGNOSIS — M6281 Muscle weakness (generalized): Secondary | ICD-10-CM

## 2023-09-02 ENCOUNTER — Ambulatory Visit: Payer: Medicare HMO | Admitting: Physical Therapy

## 2023-09-02 DIAGNOSIS — M4126 Other idiopathic scoliosis, lumbar region: Secondary | ICD-10-CM | POA: Diagnosis not present

## 2023-09-02 DIAGNOSIS — M2560 Stiffness of unspecified joint, not elsewhere classified: Secondary | ICD-10-CM

## 2023-09-02 DIAGNOSIS — M6281 Muscle weakness (generalized): Secondary | ICD-10-CM

## 2023-09-07 ENCOUNTER — Ambulatory Visit: Payer: Medicare HMO | Admitting: Physical Therapy

## 2023-09-07 DIAGNOSIS — M2560 Stiffness of unspecified joint, not elsewhere classified: Secondary | ICD-10-CM | POA: Diagnosis not present

## 2023-09-07 DIAGNOSIS — M6281 Muscle weakness (generalized): Secondary | ICD-10-CM | POA: Diagnosis not present

## 2023-09-07 DIAGNOSIS — M4126 Other idiopathic scoliosis, lumbar region: Secondary | ICD-10-CM | POA: Diagnosis not present

## 2023-09-09 ENCOUNTER — Ambulatory Visit: Payer: Medicare HMO | Admitting: Physical Therapy

## 2023-09-09 DIAGNOSIS — M4126 Other idiopathic scoliosis, lumbar region: Secondary | ICD-10-CM | POA: Diagnosis not present

## 2023-09-09 DIAGNOSIS — M2560 Stiffness of unspecified joint, not elsewhere classified: Secondary | ICD-10-CM | POA: Diagnosis not present

## 2023-09-09 DIAGNOSIS — M6281 Muscle weakness (generalized): Secondary | ICD-10-CM | POA: Diagnosis not present

## 2023-09-11 ENCOUNTER — Encounter: Payer: Self-pay | Admitting: Physical Therapy

## 2023-09-11 NOTE — Therapy (Addendum)
 OUTPATIENT PHYSICAL THERAPY THORACOLUMBAR TREATMENT Physical Therapy Progress Note  Dates of reporting period  07/30/23   to   09/02/23    Patient Name: Jennifer Morrow MRN: [id number] DOB:[birth year], 73 y.o., female Today's Date: 09/02/2023  END OF SESSION:  PT End of Session - 09/11/23 1230     Visit Number 10    Number of Visits 12    Date for PT Re-Evaluation 09/10/23    PT Start Time 0903    PT Stop Time 0947    PT Time Calculation (min) 44 min    Activity Tolerance Patient tolerated treatment well;Patient limited by pain    Behavior During Therapy [facility] for tasks assessed/performed             Past Medical History:  Diagnosis Date   Arthritis    Bronchitis    CHRONIC   Bulging lumbar disc    degenerative dics   Scoliosis    LIMITS LUNG FUNCTION   Spinal stenosis    Past Surgical History:  Procedure Laterality Date   CATARACT EXTRACTION W/PHACO Left 01/06/2018   Procedure: CATARACT EXTRACTION PHACO AND INTRAOCULAR LENS PLACEMENT (IOC);  Surgeon: Rosa College, MD;  Location: [facility] ORS;  Service: Ophthalmology;  Laterality: Left;  US  00:27.5 AP% 4.3 CDE 1.18 FLUID PACK LOT # [id number]   CATARACT EXTRACTION W/PHACO Right 02/10/2018   Procedure: CATARACT EXTRACTION PHACO AND INTRAOCULAR LENS PLACEMENT (IOC);  Surgeon: Rosa College, MD;  Location: [facility] ORS;  Service: Ophthalmology;  Laterality: Right;  US  00:30.5 AP% 5.0 CDE 1.51  Fluid pack lot # [id number]   FOOT X 2     HEEL SPUR EXCISION     TONSILLECTOMY     Patient Active Problem List   Diagnosis Date Noted   Elevated liver enzymes 06/14/2023   Osteoarthritis cervical spine 01/14/2022   Age-related osteoporosis without current pathological fracture 05/19/2021   Spondylosis of lumbar region without myelopathy or radiculopathy 05/19/2021   Degenerative disc disease, lumbar 05/19/2021   Scoliosis, unspecified 01/09/2015   Vitamin D  deficiency 01/08/2014   Mixed hyperlipidemia 01/08/2014    PCP:  [name], MD  REFERRING PROVIDER: [name], MD  REFERRING DIAG: M41.9 (ICD-10-CM) - Scoliosis, unspecified   Rationale for Evaluation and Treatment: Rehabilitation  THERAPY DIAG:  Other idiopathic scoliosis, lumbar region  Joint stiffness of spine  Muscle weakness (generalized)  ONSET DATE: chronic                                                                                                                                                                                        SUBJECTIVE:  SUBJECTIVE STATEMENT: Pt. Reports chronic low back pain everyday.  Pt. Uses MH and hot tub daily to manage symptoms.  Increase pain with prolonged standing.  Pt. Returned to [place]/ [place] after a 19 day trip (07/11/23).  Pt. Planning to return to pool at [employer] 2x/week, Yoga 1x/week, personal trainer 1x/week.  Pt. Is considering major back surgery with Dr. [name] (f/u 08/31/23) if bone density is improved.  Pt. Reports L LE/calf/foot N/T and weakness (no falls reported).  Pt. Wants to return to walking with trekking poles and no back symptoms.     PERTINENT HISTORY:  Pt. Well known to PT clinic.     PAIN:  Are you having pain? Yes: NPRS scale: 2/10 Pain location: low back Pain description: aching Aggravating factors: increase activity/ prolonged standing and walking Relieving factors: heat (hot tub use)   PRECAUTIONS: Back   RED FLAGS: None      WEIGHT BEARING RESTRICTIONS: No   FALLS:  Has patient fallen in last 6 months? No   LIVING ENVIRONMENT: Lives with: lives alone Lives in: House/apartment Has following equipment at home: Single point cane   OCCUPATION: Retired   PLOF: Independent   PATIENT GOALS: Increase activity during the day with less back pain.     NEXT MD VISIT:  08/31/23   OBJECTIVE:  Note: Objective measures were completed at Evaluation unless otherwise noted.   DIAGNOSTIC FINDINGS:  See imaging   PATIENT SURVEYS:  FOTO initial 37/ goal 50   COGNITION: Overall cognitive status: Within functional limits for tasks assessed                          SENSATION: L LE N/T   MUSCLE LENGTH: Hamstrings: Right 85 deg; Left 82 deg   POSTURE: rounded shoulders and forward head.  Moderate scoliosis (L to R).     PALPATION: (+) tenderness in R low thoracic/lumbar spine (hypomobile).     LUMBAR ROM:    AROM eval  Flexion 25% limited  Extension 75% limited  Right lateral flexion 50% limited  Left lateral flexion 50% limited  Right rotation 50% limited  Left rotation 50% limited   (Blank rows = not tested)   LOWER EXTREMITY ROM:      Active  Right eval Left eval  Hip flexion 98 deg. 100 deg.  Hip extension      Hip abduction      Hip adduction      Hip internal rotation 30 deg. 30 deg.  Hip external rotation      Knee flexion [facility] WFL  Knee extension [facility]  Ankle dorsiflexion      Ankle plantarflexion      Ankle inversion      Ankle eversion       (Blank rows = not tested)   LOWER EXTREMITY MMT:     MMT Right eval Left eval  Hip flexion 3/5 3/5  Hip extension      Hip abduction 3+ 3+  Hip adduction      Hip internal rotation 3+ 3+  Hip external rotation 3+ 3+  Knee flexion 4 4  Knee extension 4 4  Ankle dorsiflexion 4 4  Ankle plantarflexion      Ankle inversion      Ankle eversion       (Blank rows = not tested)   LUMBAR SPECIAL TESTS:  FABER test: Negative   FUNCTIONAL TESTS:  5 times sit to stand: TBD  GAIT: Distance walked: in clinic Assistive device utilized: None Level of assistance: Modified independence Comments: Pt. Ambulates with consistent recip. Gait pattern with no assistive devices.  Pt. Has rounded shoulder posture with limited arm swing.  Good heel strike/ clearance.    B LE muscle  strength: 5/5 MMT.    TREATMENT DATE:  09/02/23                  Subjective:  Pt. C/o 3/10 LBP this morning and decrease pain overall.   Pt. Has a busy weekend and will modify activity to decrease pain.     There.ex.:   Nustep L4 10 min. B UE/LE (use of MH to low back during Nustep).   2# seated chest press/ sh. Flexion/ bicep curls/ sh. Circles/ lunges in //-bars with proper technique 15x2.    Resisted gait 2BTB 10x all 4-planes of movement.  Mirror feedback for posture correction.    Seated ball ex.: wt. Shifting/ marching/ alt. UE and LE/ LAQ/ 2# bicep curls/ press-ups/ punches/ circles 10x2 each.  Mirror feedback/ tactile posture correction.    Discussed HEP   Manual tx.:   Supine LE/lumbar stretches (generalized)- 10 min.     Seated STM to mid-thoracic/lumbar paraspinals with use of Hypervolt.                PATIENT EDUCATION:  Education details: Aquatic ex./ stretches/ HEP Person educated: Patient Education method: Medical illustrator Education comprehension: verbalized understanding and returned demonstration   HOME EXERCISE PROGRAM: TrA ex. Program (page #1-3)- pain limited with bridging.  Modified with bolster to assist with less pain.     ASSESSMENT:   CLINICAL IMPRESSION: Pt. Presents with marked spinal ROM limitations and strength deficits.  Good understanding of TrA ex. And able to progress to seated blue ball ex with less overall fatigue.  Moderate lumbar muscle tightness with STM/ manual stretches.   Pt. Tolerates STM and use of Hypervolt in seated posture to paraspinals.  Pt. Will benefit from skilled PT services to develop HEP to increase generalized strength to improve pain-free functional mobility.       OBJECTIVE IMPAIRMENTS: Abnormal gait, decreased activity tolerance, decreased endurance, decreased mobility, difficulty walking, decreased ROM, decreased strength, impaired flexibility, improper body mechanics, postural dysfunction, and pain.     ACTIVITY LIMITATIONS: carrying, lifting, bending, standing, squatting, transfers, and locomotion level   PARTICIPATION LIMITATIONS: cleaning, laundry, driving, shopping, and community activity   PERSONAL FACTORS: Fitness and Past/current experiences are also affecting patient's functional outcome.    REHAB POTENTIAL: Good   CLINICAL DECISION MAKING: Evolving/moderate complexity   EVALUATION COMPLEXITY: Moderate     GOALS: Goals reviewed with patient? Yes   SHORT TERM GOALS: Target date: 08/13/23   Pt. Independent with HEP to progress LE muscle strength 1/2 muscle grade to improve pain-free mobility.   Baseline:  see above Goal status: Partially met   LONG TERM GOALS: Target date: 09/10/23   Pt. Will increase FOTO to 50 to improve pain-free mobility.   Baseline: 37 Goal status: On-going   2.  Pt. Will increase lumbar AROM 25% to improve functional mobility/ household tasks.   Baseline: See above Goal status: Not met   3.  Pt. Will report <5/10 back pain at worst with daily household tasks.   Baseline: 8/10 pain at worst Goal status: Not met   4.  Pt. Will increase B hip/LE muscle strength to 4+/5 MMT to improve standing/walking tasks.   Baseline: see above Goal status: INITIAL  PLAN:   PT FREQUENCY: 2x/week   PT DURATION: 6 weeks   PLANNED INTERVENTIONS: 97110-Therapeutic exercises, 97530- Therapeutic activity, 97112- Neuromuscular re-education, 97535- Self Care, 40981- Manual therapy, [phone]- Gait training, [phone]- Aquatic Therapy, Patient/Family education, Balance training, Stair training, Dry Needling, Joint mobilization, Cryotherapy, and Moist heat.   PLAN FOR NEXT SESSION: Progress HEP  Lendell Quarry, PT, DPT # [phone] 09/11/2023, 12:31 PM

## 2023-09-11 NOTE — Therapy (Addendum)
 OUTPATIENT PHYSICAL THERAPY THORACOLUMBAR TREATMENT  Patient Name: Jennifer Morrow MRN: [id number] DOB:[birth year], 73 y.o., female Today's Date: 08/31/2023  END OF SESSION:  PT End of Session - 09/11/23 1203     Visit Number 9    Number of Visits 12    Date for PT Re-Evaluation 09/10/23    PT Start Time 1342    PT Stop Time 1433    PT Time Calculation (min) 51 min    Activity Tolerance Patient tolerated treatment well;Patient limited by pain    Behavior During Therapy [facility] for tasks assessed/performed             Past Medical History:  Diagnosis Date   Arthritis    Bronchitis    CHRONIC   Bulging lumbar disc    degenerative dics   Scoliosis    LIMITS LUNG FUNCTION   Spinal stenosis    Past Surgical History:  Procedure Laterality Date   CATARACT EXTRACTION W/PHACO Left 01/06/2018   Procedure: CATARACT EXTRACTION PHACO AND INTRAOCULAR LENS PLACEMENT (IOC);  Surgeon: Myrna Adine Anes, MD;  Location: [facility] ORS;  Service: Ophthalmology;  Laterality: Left;  US  00:27.5 AP% 4.3 CDE 1.18 FLUID PACK LOT # [id number]   CATARACT EXTRACTION W/PHACO Right 02/10/2018   Procedure: CATARACT EXTRACTION PHACO AND INTRAOCULAR LENS PLACEMENT (IOC);  Surgeon: Myrna Adine Anes, MD;  Location: [facility] ORS;  Service: Ophthalmology;  Laterality: Right;  US  00:30.5 AP% 5.0 CDE 1.51  Fluid pack lot # [id number]   FOOT X 2     HEEL SPUR EXCISION     TONSILLECTOMY     Patient Active Problem List   Diagnosis Date Noted   Elevated liver enzymes 06/14/2023   Osteoarthritis cervical spine 01/14/2022   Age-related osteoporosis without current pathological fracture 05/19/2021   Spondylosis of lumbar region without myelopathy or radiculopathy 05/19/2021   Degenerative disc disease, lumbar 05/19/2021   Scoliosis, unspecified 01/09/2015   Vitamin D  deficiency 01/08/2014   Mixed hyperlipidemia 01/08/2014    PCP: Emi Bills, MD  REFERRING PROVIDER: Emi Bills, MD  REFERRING DIAG: M41.9  (ICD-10-CM) - Scoliosis, unspecified   Rationale for Evaluation and Treatment: Rehabilitation  THERAPY DIAG:  Other idiopathic scoliosis, lumbar region  Joint stiffness of spine  Muscle weakness (generalized)  ONSET DATE: chronic                                                                                                                                                                                        SUBJECTIVE:  SUBJECTIVE STATEMENT: Pt. Reports chronic low back pain everyday.  Pt. Uses MH and hot tub daily to manage symptoms.  Increase pain with prolonged standing.  Pt. Returned to [place]/ [place] after a 19 day trip (07/11/23).  Pt. Planning to return to pool at [employer] 2x/week, Yoga 1x/week, personal trainer 1x/week.  Pt. Is considering major back surgery with Dr. Emi (f/u 08/31/23) if bone density is improved.  Pt. Reports L LE/calf/foot N/T and weakness (no falls reported).  Pt. Wants to return to walking with trekking poles and no back symptoms.     PERTINENT HISTORY:  Pt. Well known to PT clinic.     PAIN:  Are you having pain? Yes: NPRS scale: 2/10 Pain location: low back Pain description: aching Aggravating factors: increase activity/ prolonged standing and walking Relieving factors: heat (hot tub use)   PRECAUTIONS: Back   RED FLAGS: None      WEIGHT BEARING RESTRICTIONS: No   FALLS:  Has patient fallen in last 6 months? No   LIVING ENVIRONMENT: Lives with: lives alone Lives in: House/apartment Has following equipment at home: Single point cane   OCCUPATION: Retired   PLOF: Independent   PATIENT GOALS: Increase activity during the day with less back pain.     NEXT MD VISIT: 08/31/23   OBJECTIVE:  Note: Objective measures were completed at Evaluation unless  otherwise noted.   DIAGNOSTIC FINDINGS:  See imaging   PATIENT SURVEYS:  FOTO initial 37/ goal 50   COGNITION: Overall cognitive status: Within functional limits for tasks assessed                          SENSATION: L LE N/T   MUSCLE LENGTH: Hamstrings: Right 85 deg; Left 82 deg   POSTURE: rounded shoulders and forward head.  Moderate scoliosis (L to R).     PALPATION: (+) tenderness in R low thoracic/lumbar spine (hypomobile).     LUMBAR ROM:    AROM eval  Flexion 25% limited  Extension 75% limited  Right lateral flexion 50% limited  Left lateral flexion 50% limited  Right rotation 50% limited  Left rotation 50% limited   (Blank rows = not tested)   LOWER EXTREMITY ROM:      Active  Right eval Left eval  Hip flexion 98 deg. 100 deg.  Hip extension      Hip abduction      Hip adduction      Hip internal rotation 30 deg. 30 deg.  Hip external rotation      Knee flexion [facility] WFL  Knee extension [facility]  Ankle dorsiflexion      Ankle plantarflexion      Ankle inversion      Ankle eversion       (Blank rows = not tested)   LOWER EXTREMITY MMT:     MMT Right eval Left eval  Hip flexion 3/5 3/5  Hip extension      Hip abduction 3+ 3+  Hip adduction      Hip internal rotation 3+ 3+  Hip external rotation 3+ 3+  Knee flexion 4 4  Knee extension 4 4  Ankle dorsiflexion 4 4  Ankle plantarflexion      Ankle inversion      Ankle eversion       (Blank rows = not tested)   LUMBAR SPECIAL TESTS:  FABER test: Negative   FUNCTIONAL TESTS:  5 times sit to stand: TBD  GAIT: Distance walked: in clinic Assistive device utilized: None Level of assistance: Modified independence Comments: Pt. Ambulates with consistent recip. Gait pattern with no assistive devices.  Pt. Has rounded shoulder posture with limited arm swing.  Good heel strike/ clearance.    B LE muscle strength: 5/5 MMT.    TREATMENT DATE:  08/31/23                  Subjective:  Pt. Went to  pool yesterday and states she had to take a pain pill last Friday secondary to increase activity/ book club.  Pt. Had MD appt. Today and states bones may not be strong enough for surgery.  No injections to build bone density.  No cervical surgery.  MD recommends continued PT tx. And CT scans in April.  Pt. States back pain is okay right now.     There.ex.:   Nustep L4 10 min. B UE/LE (use of MH to low back during Nustep)- discussed MD appt.       2# seated chest press/ sh. Flexion/ bicep curls/ sh. Circles/ lunges in //-bars with proper technique 15x2.    Supine trunk rotn. 5x to L/R.  Dead bug 20x/ red bolster bridging 10x2.  Supine SLR 10x2 (cuing for pelvic position).  Supine B shoulder horizontal abduction/ pec stretches.     Discussed HEP   Manual tx.:   Supine LE/lumbar stretches (generalized)- 10 min.     Seated STM to mid-thoracic/lumbar paraspinals with use of Hypervolt.               Not Today Seated blue ball ex. At //-bars (mirror feedback): marching/ LAQ/ wt. Shifting/ RTB scap. Retraction 20x.  Pt. Challenged with hip flexion and LAQ.  Fatigue noted.                                Standing GTB marching/ hip abduction in //-bars with mirror feedback (20x).  Standing GTB scap. Retraction/ ER (W)/ L/R rotn 20x each.                                                                        PATIENT EDUCATION:  Education details: Aquatic ex./ stretches/ HEP Person educated: Patient Education method: Medical Illustrator Education comprehension: verbalized understanding and returned demonstration   HOME EXERCISE PROGRAM: TrA ex. Program (page #1-3)- pain limited with bridging.  Modified with bolster to assist with less pain.     ASSESSMENT:   CLINICAL IMPRESSION: Pt. Presents with marked spinal ROM limitations and strength deficits.  Good understanding of TrA ex. And able to progress to seated blue ball ex with less overall fatigue.  Moderate lumbar muscle  tightness with STM/ manual stretches.  Slight increase in back discomfort during standing lunges/ UE resisted ex.  Pt. Tolerates STM and use of Hypervolt in seated posture to paraspinals.  Pt. Will benefit from skilled PT services to develop HEP to increase generalized strength to improve pain-free functional mobility.       OBJECTIVE IMPAIRMENTS: Abnormal gait, decreased activity tolerance, decreased endurance, decreased mobility, difficulty walking, decreased ROM, decreased strength, impaired flexibility, improper body mechanics, postural dysfunction, and pain.    ACTIVITY  LIMITATIONS: carrying, lifting, bending, standing, squatting, transfers, and locomotion level   PARTICIPATION LIMITATIONS: cleaning, laundry, driving, shopping, and community activity   PERSONAL FACTORS: Fitness and Past/current experiences are also affecting patient's functional outcome.    REHAB POTENTIAL: Good   CLINICAL DECISION MAKING: Evolving/moderate complexity   EVALUATION COMPLEXITY: Moderate     GOALS: Goals reviewed with patient? Yes   SHORT TERM GOALS: Target date: 08/13/23   Pt. Independent with HEP to progress LE muscle strength 1/2 muscle grade to improve pain-free mobility.   Baseline:  see above Goal status: On-going   LONG TERM GOALS: Target date: 09/10/23   Pt. Will increase FOTO to 50 to improve pain-free mobility.   Baseline: 37 Goal status: INITIAL   2.  Pt. Will increase lumbar AROM 25% to improve functional mobility/ household tasks.   Baseline: See above Goal status: INITIAL   3.  Pt. Will report <5/10 back pain at worst with daily household tasks.   Baseline: 8/10 pain at worst Goal status: INITIAL   4.  Pt. Will increase B hip/LE muscle strength to 4+/5 MMT to improve standing/walking tasks.   Baseline: see above Goal status: INITIAL   PLAN:   PT FREQUENCY: 2x/week   PT DURATION: 6 weeks   PLANNED INTERVENTIONS: 97110-Therapeutic exercises, 97530- Therapeutic activity,  [id number]- Neuromuscular re-education, 97535- Self Care, [id number]- Manual therapy, [phone]- Gait training, [phone]- Aquatic Therapy, Patient/Family education, Balance training, Stair training, Dry Needling, Joint mobilization, Cryotherapy, and Moist heat.   PLAN FOR NEXT SESSION: Progress HEP  Ozell JAYSON Sero, PT, DPT # [phone] 09/11/2023, 12:04 PM

## 2023-09-11 NOTE — Therapy (Incomplete Revision)
OUTPATIENT PHYSICAL THERAPY THORACOLUMBAR TREATMENT Physical Therapy Progress Note  Dates of reporting period  07/30/23   to   09/02/23    Patient Name: Jennifer Morrow MRN: [id number] DOB:Dec 20, 1950, 73 y.o., female Today's Date: 09/02/2023  END OF SESSION:  PT End of Session - 09/11/23 1230     Visit Number 10    Number of Visits 12    Date for PT Re-Evaluation 09/10/23    PT Start Time 0903    PT Stop Time 0947    PT Time Calculation (min) 44 min    Activity Tolerance Patient tolerated treatment well;Patient limited by pain    Behavior During Therapy [facility] for tasks assessed/performed             Past Medical History:  Diagnosis Date   Arthritis    Bronchitis    CHRONIC   Bulging lumbar disc    degenerative dics   Scoliosis    LIMITS LUNG FUNCTION   Spinal stenosis    Past Surgical History:  Procedure Laterality Date   CATARACT EXTRACTION W/PHACO Left 01/06/2018   Procedure: CATARACT EXTRACTION PHACO AND INTRAOCULAR LENS PLACEMENT (IOC);  Surgeon: Nevada Crane, MD;  Location: [facility] OR[facility];  Service: Ophthalmology;  Laterality: Left;  [place] 00:27.5 AP% 4.3 CDE 1.18 FLUID PACK LOT # [id number]   CATARACT EXTRACTION W/PHACO Right 02/10/2018   Procedure: CATARACT EXTRACTION PHACO AND INTRAOCULAR LENS PLACEMENT (IOC);  Surgeon: Nevada Crane, MD;  Location: [facility] OR[facility];  Service: Ophthalmology;  Laterality: Right;  [place] 00:30.5 AP% 5.0 CDE 1.51  Fluid pack lot # [id number] H   FOOT X 2     HEEL SPUR EXCISION     TONSILLECTOMY     Patient Active Problem List   Diagnosis Date Noted   Elevated liver enzymes 06/14/2023   Osteoarthritis cervical spine 01/14/2022   Age-related osteoporosis without current pathological fracture 05/19/2021   Spondylosis of lumbar region without myelopathy or radiculopathy 05/19/2021   Degenerative disc disease, lumbar 05/19/2021   Scoliosis, unspecified 01/09/2015   Vitamin D deficiency 01/08/2014   Mixed hyperlipidemia 01/08/2014    PCP:  Leana Gamer, MD  REFERRING PROVIDER: Leana Gamer, MD  REFERRING DIAG: M41.9 (ICD-10-CM) - Scoliosis, unspecified   Rationale for Evaluation and Treatment: Rehabilitation  THERAPY DIAG:  Other idiopathic scoliosis, lumbar region  Joint stiffness of spine  Muscle weakness (generalized)  ONSET DATE: chronic                                                                                                                                                                                        SUBJECTIVE:  SUBJECTIVE STATEMENT: Pt. Reports chronic low back pain everyday.  Pt. Uses MH and hot tub daily to manage symptoms.  Increase pain with prolonged standing.  Pt. Returned to [place]/ [place] after a 19 day trip (07/11/23).  Pt. Planning to return to pool at [employer] 2x/week, Yoga 1x/week, personal trainer 1x/week.  Pt. Is considering major back surgery with Dr. Pam Drown (f/u 08/31/23) if bone density is improved.  Pt. Reports L LE/calf/foot N/T and weakness (no falls reported).  Pt. Wants to return to walking with trekking poles and no back symptoms.     PERTINENT HISTORY:  Pt. Well known to PT clinic.     PAIN:  Are you having pain? Yes: NPRS scale: 2/10 Pain location: low back Pain description: aching Aggravating factors: increase activity/ prolonged standing and walking Relieving factors: heat (hot tub use)   PRECAUTIONS: Back   RED FLAGS: None      WEIGHT BEARING RESTRICTIONS: No   FALLS:  Has patient fallen in last 6 months? No   LIVING ENVIRONMENT: Lives with: lives alone Lives in: House/apartment Has following equipment at home: Single point cane   OCCUPATION: Retired   PLOF: Independent   PATIENT GOALS: Increase activity during the day with less back pain.     NEXT MD VISIT:  08/31/23   OBJECTIVE:  Note: Objective measures were completed at Evaluation unless otherwise noted.   DIAGNOSTIC FINDINGS:  See imaging   PATIENT SURVEYS:  FOTO initial 37/ goal 50   COGNITION: Overall cognitive status: Within functional limits for tasks assessed                          SENSATION: L LE N/T   MUSCLE LENGTH: Hamstrings: Right 85 deg; Left 82 deg   POSTURE: rounded shoulders and forward head.  Moderate scoliosis (L to R).     PALPATION: (+) tenderness in R low thoracic/lumbar spine (hypomobile).     LUMBAR ROM:    AROM eval  Flexion 25% limited  Extension 75% limited  Right lateral flexion 50% limited  Left lateral flexion 50% limited  Right rotation 50% limited  Left rotation 50% limited   (Blank rows = not tested)   LOWER EXTREMITY ROM:      Active  Right eval Left eval  Hip flexion 98 deg. 100 deg.  Hip extension      Hip abduction      Hip adduction      Hip internal rotation 30 deg. 30 deg.  Hip external rotation      Knee flexion [facility] WFL  Knee extension [facility]  Ankle dorsiflexion      Ankle plantarflexion      Ankle inversion      Ankle eversion       (Blank rows = not tested)   LOWER EXTREMITY MMT:     MMT Right eval Left eval  Hip flexion 3/5 3/5  Hip extension      Hip abduction 3+ 3+  Hip adduction      Hip internal rotation 3+ 3+  Hip external rotation 3+ 3+  Knee flexion 4 4  Knee extension 4 4  Ankle dorsiflexion 4 4  Ankle plantarflexion      Ankle inversion      Ankle eversion       (Blank rows = not tested)   LUMBAR SPECIAL TESTS:  FABER test: Negative   FUNCTIONAL TESTS:  5 times sit to stand: TBD  GAIT: Distance walked: in clinic Assistive device utilized: None Level of assistance: Modified independence Comments: Pt. Ambulates with consistent recip. Gait pattern with no assistive devices.  Pt. Has rounded shoulder posture with limited arm swing.  Good heel strike/ clearance.    B LE muscle  strength: 5/5 MMT.    TREATMENT DATE:  08/31/23                  Subjective:  Pt. Went to pool yesterday and states she had to take a pain pill last Friday secondary to increase activity/ book club.  Pt. Had MD appt. Today and states bones may not be strong enough for surgery.  No injections to build bone density.  No cervical surgery.  MD recommends continued PT tx. And CT scans in April.  Pt. States "back pain is okay right now".     There.ex.:   Nustep L4 10 min. B UE/LE (use of MH to low back during Nustep)- discussed MD appt.       2# seated chest press/ sh. Flexion/ bicep curls/ sh. Circles/ lunges in //-bars with proper technique 15x2.    Supine trunk rotn. 5x to L/R.  Dead bug 20x/ red bolster bridging 10x2.  Supine SLR 10x2 (cuing for pelvic position).  Supine B shoulder horizontal abduction/ pec stretches.     Discussed HEP   Manual tx.:   Supine LE/lumbar stretches (generalized)- 10 min.     Seated STM to mid-thoracic/lumbar paraspinals with use of Hypervolt.               Not Today Seated blue ball ex. At //-bars (mirror feedback): marching/ LAQ/ wt. Shifting/ RTB scap. Retraction 20x.  Pt. Challenged with hip flexion and LAQ.  Fatigue noted.                                Standing GTB marching/ hip abduction in //-bars with mirror feedback (20x).  Standing GTB scap. Retraction/ ER ("W")/ L/R rotn 20x each.                                                                        PATIENT EDUCATION:  Education details: Aquatic ex./ stretches/ HEP Person educated: Patient Education method: Medical illustrator Education comprehension: verbalized understanding and returned demonstration   HOME EXERCISE PROGRAM: TrA ex. Program (page #1-3)- pain limited with bridging.  Modified with bolster to assist with less pain.     ASSESSMENT:   CLINICAL IMPRESSION: Pt. Presents with marked spinal ROM limitations and strength deficits.  Good understanding of TrA ex. And able  to progress to seated blue ball ex with less overall fatigue.  Moderate lumbar muscle tightness with STM/ manual stretches.  Slight increase in back discomfort during standing lunges/ UE resisted ex.  Pt. Tolerates STM and use of Hypervolt in seated posture to paraspinals.  Pt. Will benefit from skilled PT services to develop HEP to increase generalized strength to improve pain-free functional mobility.       OBJECTIVE IMPAIRMENTS: Abnormal gait, decreased activity tolerance, decreased endurance, decreased mobility, difficulty walking, decreased ROM, decreased strength, impaired flexibility, improper body mechanics, postural dysfunction, and pain.    ACTIVITY  LIMITATIONS: carrying, lifting, bending, standing, squatting, transfers, and locomotion level   PARTICIPATION LIMITATIONS: cleaning, laundry, driving, shopping, and community activity   PERSONAL FACTORS: Fitness and Past/current experiences are also affecting patient's functional outcome.    REHAB POTENTIAL: Good   CLINICAL DECISION MAKING: Evolving/moderate complexity   EVALUATION COMPLEXITY: Moderate     GOALS: Goals reviewed with patient? Yes   SHORT TERM GOALS: Target date: 08/13/23   Pt. Independent with HEP to progress LE muscle strength 1/2 muscle grade to improve pain-free mobility.   Baseline:  see above Goal status: On-going   LONG TERM GOALS: Target date: 09/10/23   Pt. Will increase FOTO to 50 to improve pain-free mobility.   Baseline: 37 Goal status: INITIAL   2.  Pt. Will increase lumbar AROM 25% to improve functional mobility/ household tasks.   Baseline: See above Goal status: INITIAL   3.  Pt. Will report <5/10 back pain at worst with daily household tasks.   Baseline: 8/10 pain at worst Goal status: INITIAL   4.  Pt. Will increase B hip/LE muscle strength to 4+/5 MMT to improve standing/walking tasks.   Baseline: see above Goal status: INITIAL   PLAN:   PT FREQUENCY: 2x/week   PT DURATION: 6  weeks   PLANNED INTERVENTIONS: 97110-Therapeutic exercises, 97530- Therapeutic activity, [id number]- Neuromuscular re-education, 97535- Self Care, 28413- Manual therapy, [phone]- Gait training, [phone]- Aquatic Therapy, Patient/Family education, Balance training, Stair training, Dry Needling, Joint mobilization, Cryotherapy, and Moist heat.   PLAN FOR NEXT SESSION: Progress HEP  Cammie Mcgee, PT, DPT # [phone] 09/11/2023, 12:31 PM

## 2023-09-11 NOTE — Therapy (Addendum)
 OUTPATIENT PHYSICAL THERAPY THORACOLUMBAR TREATMENT  Patient Name: Jennifer Morrow MRN: [id number] DOB:23-Jun-1951, 73 y.o., female Today's Date: 09/07/2023  END OF SESSION:  PT End of Session - 09/11/23 1246     Visit Number 11    Number of Visits 12    Date for PT Re-Evaluation 09/10/23    PT Start Time 0900    PT Stop Time 0948    PT Time Calculation (min) 48 min    Activity Tolerance Patient tolerated treatment well;Patient limited by pain    Behavior During Therapy [facility] for tasks assessed/performed             Past Medical History:  Diagnosis Date   Arthritis    Bronchitis    CHRONIC   Bulging lumbar disc    degenerative dics   Scoliosis    LIMITS LUNG FUNCTION   Spinal stenosis    Past Surgical History:  Procedure Laterality Date   CATARACT EXTRACTION W/PHACO Left 01/06/2018   Procedure: CATARACT EXTRACTION PHACO AND INTRAOCULAR LENS PLACEMENT (IOC);  Surgeon: Rosa College, MD;  Location: [facility] ORS;  Service: Ophthalmology;  Laterality: Left;  US  00:27.5 AP% 4.3 CDE 1.18 FLUID PACK LOT # [id number]   CATARACT EXTRACTION W/PHACO Right 02/10/2018   Procedure: CATARACT EXTRACTION PHACO AND INTRAOCULAR LENS PLACEMENT (IOC);  Surgeon: Rosa College, MD;  Location: [facility] ORS;  Service: Ophthalmology;  Laterality: Right;  US  00:30.5 AP% 5.0 CDE 1.51  Fluid pack lot # [id number]   FOOT X 2     HEEL SPUR EXCISION     TONSILLECTOMY     Patient Active Problem List   Diagnosis Date Noted   Elevated liver enzymes 06/14/2023   Osteoarthritis cervical spine 01/14/2022   Age-related osteoporosis without current pathological fracture 05/19/2021   Spondylosis of lumbar region without myelopathy or radiculopathy 05/19/2021   Degenerative disc disease, lumbar 05/19/2021   Scoliosis, unspecified 01/09/2015   Vitamin D  deficiency 01/08/2014   Mixed hyperlipidemia 01/08/2014    PCP: [name], MD  REFERRING PROVIDER: [name], MD  REFERRING DIAG: M41.9  (ICD-10-CM) - Scoliosis, unspecified   Rationale for Evaluation and Treatment: Rehabilitation  THERAPY DIAG:  Other idiopathic scoliosis, lumbar region  Joint stiffness of spine  Muscle weakness (generalized)  ONSET DATE: chronic                                                                                                                                                                                        SUBJECTIVE:  SUBJECTIVE STATEMENT: Pt. Reports chronic low back pain everyday.  Pt. Uses MH and hot tub daily to manage symptoms.  Increase pain with prolonged standing.  Pt. Returned to [place]/ [place] after a 19 day trip (07/11/23).  Pt. Planning to return to pool at [employer] 2x/week, Yoga 1x/week, personal trainer 1x/week.  Pt. Is considering major back surgery with Dr. [name] (f/u 08/31/23) if bone density is improved.  Pt. Reports L LE/calf/foot N/T and weakness (no falls reported).  Pt. Wants to return to walking with trekking poles and no back symptoms.     PERTINENT HISTORY:  Pt. Well known to PT clinic.     PAIN:  Are you having pain? Yes: NPRS scale: 2/10 Pain location: low back Pain description: aching Aggravating factors: increase activity/ prolonged standing and walking Relieving factors: heat (hot tub use)   PRECAUTIONS: Back   RED FLAGS: None      WEIGHT BEARING RESTRICTIONS: No   FALLS:  Has patient fallen in last 6 months? No   LIVING ENVIRONMENT: Lives with: lives alone Lives in: House/apartment Has following equipment at home: Single point cane   OCCUPATION: Retired   PLOF: Independent   PATIENT GOALS: Increase activity during the day with less back pain.     NEXT MD VISIT: 08/31/23   OBJECTIVE:  Note: Objective measures were completed at Evaluation unless  otherwise noted.   DIAGNOSTIC FINDINGS:  See imaging   PATIENT SURVEYS:  FOTO initial 37/ goal 50   COGNITION: Overall cognitive status: Within functional limits for tasks assessed                          SENSATION: L LE N/T   MUSCLE LENGTH: Hamstrings: Right 85 deg; Left 82 deg   POSTURE: rounded shoulders and forward head.  Moderate scoliosis (L to R).     PALPATION: (+) tenderness in R low thoracic/lumbar spine (hypomobile).     LUMBAR ROM:    AROM eval  Flexion 25% limited  Extension 75% limited  Right lateral flexion 50% limited  Left lateral flexion 50% limited  Right rotation 50% limited  Left rotation 50% limited   (Blank rows = not tested)   LOWER EXTREMITY ROM:      Active  Right eval Left eval  Hip flexion 98 deg. 100 deg.  Hip extension      Hip abduction      Hip adduction      Hip internal rotation 30 deg. 30 deg.  Hip external rotation      Knee flexion [facility] WFL  Knee extension [facility]  Ankle dorsiflexion      Ankle plantarflexion      Ankle inversion      Ankle eversion       (Blank rows = not tested)   LOWER EXTREMITY MMT:     MMT Right eval Left eval  Hip flexion 3/5 3/5  Hip extension      Hip abduction 3+ 3+  Hip adduction      Hip internal rotation 3+ 3+  Hip external rotation 3+ 3+  Knee flexion 4 4  Knee extension 4 4  Ankle dorsiflexion 4 4  Ankle plantarflexion      Ankle inversion      Ankle eversion       (Blank rows = not tested)   LUMBAR SPECIAL TESTS:  FABER test: Negative   FUNCTIONAL TESTS:  5 times sit to stand: TBD  GAIT: Distance walked: in clinic Assistive device utilized: None Level of assistance: Modified independence Comments: Pt. Ambulates with consistent recip. Gait pattern with no assistive devices.  Pt. Has rounded shoulder posture with limited arm swing.  Good heel strike/ clearance.    B LE muscle strength: 5/5 MMT.    TREATMENT DATE:  09/07/23                  Subjective:  Pt. C/o 2/10  LBP this morning (baseline pain).   Pt. Has no new complaints today.     There.ex.:   Nustep L4 10 min. B UE/LE (use of MH to low back during Nustep).   2# seated chest press/ sh. Flexion/ bicep curls/ sh. Circles/ lunges in //-bars with proper technique 15x2.    Supine knee to chest with yellow ball/ SLR/ alt. UE and LE with yellow ball under knees 20x each.  Supine red bolster bridging 20x (TrA muscle activation).   20# UE wand rotn. In standing posture.    Cervical AROM (seated): flexion (65 deg.)/ extension (20 deg.)/ R rotn. (31 deg.), L rotn. (32 deg.)/ R lat. Flexion (8 deg.)/ L lat. Flexion (13 deg.).    Walking in clinic with reassessment of step length/ posture correction/ heel strike.   PATIENT EDUCATION:  Education details: Aquatic ex./ stretches/ HEP Person educated: Patient Education method: Medical illustrator Education comprehension: verbalized understanding and returned demonstration   HOME EXERCISE PROGRAM: TrA ex. Program (page #1-3)- pain limited with bridging.  Modified with bolster to assist with less pain.     ASSESSMENT:   CLINICAL IMPRESSION: Pt. Presents with marked spinal ROM limitations and strength deficits.  PT assessed cervical AROM in seated posture at end of tx.  Good understanding of TrA ex. And progression of ex.  Pt. Will return to ex. With Terri Fester soon pending availability/ days not at PT.   Moderate lumbar muscle tightness with STM/ manual stretches.   Pt. Will benefit from skilled PT services to develop HEP to increase generalized strength to improve pain-free functional mobility.       OBJECTIVE IMPAIRMENTS: Abnormal gait, decreased activity tolerance, decreased endurance, decreased mobility, difficulty walking, decreased ROM, decreased strength, impaired flexibility, improper body mechanics, postural dysfunction, and pain.    ACTIVITY LIMITATIONS: carrying, lifting, bending, standing, squatting, transfers, and locomotion level    PARTICIPATION LIMITATIONS: cleaning, laundry, driving, shopping, and community activity   PERSONAL FACTORS: Fitness and Past/current experiences are also affecting patient's functional outcome.    REHAB POTENTIAL: Good   CLINICAL DECISION MAKING: Evolving/moderate complexity   EVALUATION COMPLEXITY: Moderate     GOALS: Goals reviewed with patient? Yes   SHORT TERM GOALS: Target date: 08/13/23   Pt. Independent with HEP to progress LE muscle strength 1/2 muscle grade to improve pain-free mobility.   Baseline:  see above Goal status: Partially met   LONG TERM GOALS: Target date: 09/10/23   Pt. Will increase FOTO to 50 to improve pain-free mobility.   Baseline: 37 Goal status: On-going   2.  Pt. Will increase lumbar AROM 25% to improve functional mobility/ household tasks.   Baseline: See above Goal status: Not met   3.  Pt. Will report <5/10 back pain at worst with daily household tasks.   Baseline: 8/10 pain at worst Goal status: Not met   4.  Pt. Will increase B hip/LE muscle strength to 4+/5 MMT to improve standing/walking tasks.   Baseline: see above Goal status: INITIAL   PLAN:  PT FREQUENCY: 2x/week   PT DURATION: 6 weeks   PLANNED INTERVENTIONS: 97110-Therapeutic exercises, 97530- Therapeutic activity, [id number]- Neuromuscular re-education, 97535- Self Care, 16109- Manual therapy, [phone]- Gait training, [phone]- Aquatic Therapy, Patient/Family education, Balance training, Stair training, Dry Needling, Joint mobilization, Cryotherapy, and Moist heat.   PLAN FOR NEXT SESSION: Recert next. Tx.   Lendell Quarry, PT, DPT # [phone] 09/11/2023, 12:47 PM

## 2023-09-11 NOTE — Therapy (Addendum)
 OUTPATIENT PHYSICAL THERAPY THORACOLUMBAR TREATMENT  Patient Name: Jennifer Morrow MRN: [id number] DOB:Dec 21, 1950, 73 y.o., female Today's Date: 09/09/2023  END OF SESSION:  PT End of Session - 09/11/23 1302     Visit Number 12    Number of Visits 12    Date for PT Re-Evaluation 09/10/23    PT Start Time 0905    PT Stop Time 0950    PT Time Calculation (min) 45 min    Activity Tolerance Patient tolerated treatment well;Patient limited by pain    Behavior During Therapy [facility] for tasks assessed/performed             Past Medical History:  Diagnosis Date   Arthritis    Bronchitis    CHRONIC   Bulging lumbar disc    degenerative dics   Scoliosis    LIMITS LUNG FUNCTION   Spinal stenosis    Past Surgical History:  Procedure Laterality Date   CATARACT EXTRACTION W/PHACO Left 01/06/2018   Procedure: CATARACT EXTRACTION PHACO AND INTRAOCULAR LENS PLACEMENT (IOC);  Surgeon: Rosa College, MD;  Location: [facility] ORS;  Service: Ophthalmology;  Laterality: Left;  US  00:27.5 AP% 4.3 CDE 1.18 FLUID PACK LOT # [id number]   CATARACT EXTRACTION W/PHACO Right 02/10/2018   Procedure: CATARACT EXTRACTION PHACO AND INTRAOCULAR LENS PLACEMENT (IOC);  Surgeon: Rosa College, MD;  Location: [facility] ORS;  Service: Ophthalmology;  Laterality: Right;  US  00:30.5 AP% 5.0 CDE 1.51  Fluid pack lot # [id number]   FOOT X 2     HEEL SPUR EXCISION     TONSILLECTOMY     Patient Active Problem List   Diagnosis Date Noted   Elevated liver enzymes 06/14/2023   Osteoarthritis cervical spine 01/14/2022   Age-related osteoporosis without current pathological fracture 05/19/2021   Spondylosis of lumbar region without myelopathy or radiculopathy 05/19/2021   Degenerative disc disease, lumbar 05/19/2021   Scoliosis, unspecified 01/09/2015   Vitamin D  deficiency 01/08/2014   Mixed hyperlipidemia 01/08/2014    PCP: [name], MD  REFERRING PROVIDER: [name], MD  REFERRING DIAG: M41.9  (ICD-10-CM) - Scoliosis, unspecified   Rationale for Evaluation and Treatment: Rehabilitation  THERAPY DIAG:  Other idiopathic scoliosis, lumbar region  Joint stiffness of spine  Muscle weakness (generalized)  ONSET DATE: chronic                                                                                                                                                                                        SUBJECTIVE:  SUBJECTIVE STATEMENT: Pt. Reports chronic low back pain everyday.  Pt. Uses MH and hot tub daily to manage symptoms.  Increase pain with prolonged standing.  Pt. Returned to [place]/ [place] after a 19 day trip (07/11/23).  Pt. Planning to return to pool at [employer] 2x/week, Yoga 1x/week, personal trainer 1x/week.  Pt. Is considering major back surgery with Dr. [name] (f/u 08/31/23) if bone density is improved.  Pt. Reports L LE/calf/foot N/T and weakness (no falls reported).  Pt. Wants to return to walking with trekking poles and no back symptoms.     PERTINENT HISTORY:  Pt. Well known to PT clinic.     PAIN:  Are you having pain? Yes: NPRS scale: 2/10 Pain location: low back Pain description: aching Aggravating factors: increase activity/ prolonged standing and walking Relieving factors: heat (hot tub use)   PRECAUTIONS: Back   RED FLAGS: None      WEIGHT BEARING RESTRICTIONS: No   FALLS:  Has patient fallen in last 6 months? No   LIVING ENVIRONMENT: Lives with: lives alone Lives in: House/apartment Has following equipment at home: Single point cane   OCCUPATION: Retired   PLOF: Independent   PATIENT GOALS: Increase activity during the day with less back pain.     NEXT MD VISIT: 08/31/23   OBJECTIVE:  Note: Objective measures were completed at Evaluation unless  otherwise noted.   DIAGNOSTIC FINDINGS:  See imaging   PATIENT SURVEYS:  FOTO initial 37/ goal 50   COGNITION: Overall cognitive status: Within functional limits for tasks assessed                          SENSATION: L LE N/T   MUSCLE LENGTH: Hamstrings: Right 85 deg; Left 82 deg   POSTURE: rounded shoulders and forward head.  Moderate scoliosis (L to R).     PALPATION: (+) tenderness in R low thoracic/lumbar spine (hypomobile).     LUMBAR ROM:    AROM eval  Flexion 25% limited  Extension 75% limited  Right lateral flexion 50% limited  Left lateral flexion 50% limited  Right rotation 50% limited  Left rotation 50% limited   (Blank rows = not tested)   LOWER EXTREMITY ROM:      Active  Right eval Left eval  Hip flexion 98 deg. 100 deg.  Hip extension      Hip abduction      Hip adduction      Hip internal rotation 30 deg. 30 deg.  Hip external rotation      Knee flexion [facility] WFL  Knee extension [facility]  Ankle dorsiflexion      Ankle plantarflexion      Ankle inversion      Ankle eversion       (Blank rows = not tested)   LOWER EXTREMITY MMT:     MMT Right eval Left eval  Hip flexion 3/5 3/5  Hip extension      Hip abduction 3+ 3+  Hip adduction      Hip internal rotation 3+ 3+  Hip external rotation 3+ 3+  Knee flexion 4 4  Knee extension 4 4  Ankle dorsiflexion 4 4  Ankle plantarflexion      Ankle inversion      Ankle eversion       (Blank rows = not tested)   LUMBAR SPECIAL TESTS:  FABER test: Negative   FUNCTIONAL TESTS:  5 times sit to stand: TBD  GAIT: Distance walked: in clinic Assistive device utilized: None Level of assistance: Modified independence Comments: Pt. Ambulates with consistent recip. Gait pattern with no assistive devices.  Pt. Has rounded shoulder posture with limited arm swing.  Good heel strike/ clearance.    B LE muscle strength: 5/5 MMT.   Cervical AROM (seated): flexion (65 deg.)/ extension (20 deg.)/ R  rotn. (31 deg.), L rotn. (32 deg.)/ R lat. Flexion (8 deg.)/ L lat. Flexion (13 deg.).     TREATMENT DATE:  09/09/23                  Subjective:  Pt. Has had an active week and continues to correlate increase back pain with increase activity.  Pt. States he is doing better with bed at night.     There.ex.:   Nustep L5 10 min. B UE/LE (use of MH to low back during Nustep).    Resisted gait 2BTB (10x) all 4-planes in //-bars with mirror feedback .   Nautilus: 20# lumbar rotn. With wand/ 10# wand pull downs 20x.   Supine core ex. Program (9 min.).    Walking in clinic/ hallway after tx. Session.    PATIENT EDUCATION:  Education details: Aquatic ex./ stretches/ HEP Person educated: Patient Education method: Medical illustrator Education comprehension: verbalized understanding and returned demonstration   HOME EXERCISE PROGRAM: TrA ex. Program (page #1-3)- pain limited with bridging.  Modified with bolster to assist with less pain.     ASSESSMENT:   CLINICAL IMPRESSION: Pt. Presents with marked spinal ROM limitations and strength deficits.  Pt. Works hard during PT tx. Session and progressing to more resisted there.ex with use of Nautilus.  Good mobility on mat table and pt. Staying active with household tasks.   Pt. Will benefit from skilled PT services to develop HEP to increase generalized strength to improve pain-free functional mobility.       OBJECTIVE IMPAIRMENTS: Abnormal gait, decreased activity tolerance, decreased endurance, decreased mobility, difficulty walking, decreased ROM, decreased strength, impaired flexibility, improper body mechanics, postural dysfunction, and pain.    ACTIVITY LIMITATIONS: carrying, lifting, bending, standing, squatting, transfers, and locomotion level   PARTICIPATION LIMITATIONS: cleaning, laundry, driving, shopping, and community activity   PERSONAL FACTORS: Fitness and Past/current experiences are also affecting patient's functional  outcome.    REHAB POTENTIAL: Good   CLINICAL DECISION MAKING: Evolving/moderate complexity   EVALUATION COMPLEXITY: Moderate     GOALS: Goals reviewed with patient? Yes   SHORT TERM GOALS: Target date: 08/13/23   Pt. Independent with HEP to progress LE muscle strength 1/2 muscle grade to improve pain-free mobility.   Baseline:  see above Goal status: Partially met   LONG TERM GOALS: Target date: 09/10/23   Pt. Will increase FOTO to 50 to improve pain-free mobility.   Baseline: 37 Goal status: On-going   2.  Pt. Will increase lumbar AROM 25% to improve functional mobility/ household tasks.   Baseline: See above Goal status: Not met   3.  Pt. Will report <5/10 back pain at worst with daily household tasks.   Baseline: 8/10 pain at worst Goal status: Not met   4.  Pt. Will increase B hip/LE muscle strength to 4+/5 MMT to improve standing/walking tasks.   Baseline: see above Goal status: INITIAL   PLAN:   PT FREQUENCY: 2x/week   PT DURATION: 6 weeks   PLANNED INTERVENTIONS: 97110-Therapeutic exercises, 97530- Therapeutic activity, [id number]- Neuromuscular re-education, 97535- Self Care, [id number]- Manual therapy, [id number]- Gait training, [phone]- Aquatic  Therapy, Patient/Family education, Balance training, Stair training, Dry Needling, Joint mobilization, Cryotherapy, and Moist heat.   PLAN FOR NEXT SESSION: Recert next. Tx./ Update goals.     Lendell Quarry, PT, DPT # [phone] 09/11/2023, 1:03 PM

## 2023-09-14 ENCOUNTER — Ambulatory Visit: Payer: Medicare HMO | Admitting: Physical Therapy

## 2023-09-14 DIAGNOSIS — M6281 Muscle weakness (generalized): Secondary | ICD-10-CM

## 2023-09-14 DIAGNOSIS — M4126 Other idiopathic scoliosis, lumbar region: Secondary | ICD-10-CM

## 2023-09-14 DIAGNOSIS — M2560 Stiffness of unspecified joint, not elsewhere classified: Secondary | ICD-10-CM | POA: Diagnosis not present

## 2023-09-16 ENCOUNTER — Ambulatory Visit: Payer: Medicare HMO | Admitting: Physical Therapy

## 2023-09-16 DIAGNOSIS — M4126 Other idiopathic scoliosis, lumbar region: Secondary | ICD-10-CM | POA: Diagnosis not present

## 2023-09-16 DIAGNOSIS — M2560 Stiffness of unspecified joint, not elsewhere classified: Secondary | ICD-10-CM | POA: Diagnosis not present

## 2023-09-16 DIAGNOSIS — M6281 Muscle weakness (generalized): Secondary | ICD-10-CM

## 2023-09-17 NOTE — Therapy (Addendum)
 OUTPATIENT PHYSICAL THERAPY THORACOLUMBAR TREATMENT/ RECERTIFICATION  Patient Name: Jennifer Morrow MRN: [id number] DOB:10-29-1950, 73 y.o., female Today's Date: 09/14/2023  END OF SESSION:  PT End of Session - 09/17/23 1821     Visit Number 13    Number of Visits 29    Date for PT Re-Evaluation 11/09/23    PT Start Time 0901    PT Stop Time 0948    PT Time Calculation (min) 47 min    Activity Tolerance Patient tolerated treatment well;Patient limited by pain    Behavior During Therapy [facility] for tasks assessed/performed             Past Medical History:  Diagnosis Date   Arthritis    Bronchitis    CHRONIC   Bulging lumbar disc    degenerative dics   Scoliosis    LIMITS LUNG FUNCTION   Spinal stenosis    Past Surgical History:  Procedure Laterality Date   CATARACT EXTRACTION W/PHACO Left 01/06/2018   Procedure: CATARACT EXTRACTION PHACO AND INTRAOCULAR LENS PLACEMENT (IOC);  Surgeon: Rosa College, MD;  Location: [facility] ORS;  Service: Ophthalmology;  Laterality: Left;  US  00:27.5 AP% 4.3 CDE 1.18 FLUID PACK LOT # [id number]   CATARACT EXTRACTION W/PHACO Right 02/10/2018   Procedure: CATARACT EXTRACTION PHACO AND INTRAOCULAR LENS PLACEMENT (IOC);  Surgeon: Rosa College, MD;  Location: [facility] ORS;  Service: Ophthalmology;  Laterality: Right;  US  00:30.5 AP% 5.0 CDE 1.51  Fluid pack lot # [id number]   FOOT X 2     HEEL SPUR EXCISION     TONSILLECTOMY     Patient Active Problem List   Diagnosis Date Noted   Elevated liver enzymes 06/14/2023   Osteoarthritis cervical spine 01/14/2022   Age-related osteoporosis without current pathological fracture 05/19/2021   Spondylosis of lumbar region without myelopathy or radiculopathy 05/19/2021   Degenerative disc disease, lumbar 05/19/2021   Scoliosis, unspecified 01/09/2015   Vitamin D  deficiency 01/08/2014   Mixed hyperlipidemia 01/08/2014    PCP: [name], MD  REFERRING PROVIDER: [name], MD  REFERRING  DIAG: M41.9 (ICD-10-CM) - Scoliosis, unspecified   Rationale for Evaluation and Treatment: Rehabilitation  THERAPY DIAG:  Other idiopathic scoliosis, lumbar region  Joint stiffness of spine  Muscle weakness (generalized)  ONSET DATE: chronic                                                                                                                                                                                        SUBJECTIVE:  SUBJECTIVE STATEMENT: Pt. Reports chronic low back pain everyday.  Pt. Uses MH and hot tub daily to manage symptoms.  Increase pain with prolonged standing.  Pt. Returned to [place]/ [place] after a 19 day trip (07/11/23).  Pt. Planning to return to pool at [employer] 2x/week, Yoga 1x/week, personal trainer 1x/week.  Pt. Is considering major back surgery with Dr. [name] (f/u 08/31/23) if bone density is improved.  Pt. Reports L LE/calf/foot N/T and weakness (no falls reported).  Pt. Wants to return to walking with trekking poles and no back symptoms.     PERTINENT HISTORY:  Pt. Well known to PT clinic.     PAIN:  Are you having pain? Yes: NPRS scale: 2/10 Pain location: low back Pain description: aching Aggravating factors: increase activity/ prolonged standing and walking Relieving factors: heat (hot tub use)   PRECAUTIONS: Back   RED FLAGS: None      WEIGHT BEARING RESTRICTIONS: No   FALLS:  Has patient fallen in last 6 months? No   LIVING ENVIRONMENT: Lives with: lives alone Lives in: House/apartment Has following equipment at home: Single point cane   OCCUPATION: Retired   PLOF: Independent   PATIENT GOALS: Increase activity during the day with less back pain.     NEXT MD VISIT: 08/31/23   OBJECTIVE:  Note: Objective measures were completed at Evaluation  unless otherwise noted.   DIAGNOSTIC FINDINGS:  See imaging   PATIENT SURVEYS:  FOTO initial 37/ goal 50   COGNITION: Overall cognitive status: Within functional limits for tasks assessed                          SENSATION: L LE N/T   MUSCLE LENGTH: Hamstrings: Right 85 deg; Left 82 deg   POSTURE: rounded shoulders and forward head.  Moderate scoliosis (L to R).     PALPATION: (+) tenderness in R low thoracic/lumbar spine (hypomobile).     LUMBAR ROM:    AROM eval  Flexion 25% limited  Extension 75% limited  Right lateral flexion 50% limited  Left lateral flexion 50% limited  Right rotation 50% limited  Left rotation 50% limited   (Blank rows = not tested)   LOWER EXTREMITY ROM:      Active  Right eval Left eval  Hip flexion 98 deg. 100 deg.  Hip extension      Hip abduction      Hip adduction      Hip internal rotation 30 deg. 30 deg.  Hip external rotation      Knee flexion [facility] WFL  Knee extension [facility]  Ankle dorsiflexion      Ankle plantarflexion      Ankle inversion      Ankle eversion       (Blank rows = not tested)   LOWER EXTREMITY MMT:     MMT Right eval Left eval  Hip flexion 3/5 3/5  Hip extension      Hip abduction 3+ 3+  Hip adduction      Hip internal rotation 3+ 3+  Hip external rotation 3+ 3+  Knee flexion 4 4  Knee extension 4 4  Ankle dorsiflexion 4 4  Ankle plantarflexion      Ankle inversion      Ankle eversion       (Blank rows = not tested)   LUMBAR SPECIAL TESTS:  FABER test: Negative   FUNCTIONAL TESTS:  5 times sit to stand: TBD  GAIT: Distance walked: in clinic Assistive device utilized: None Level of assistance: Modified independence Comments: Pt. Ambulates with consistent recip. Gait pattern with no assistive devices.  Pt. Has rounded shoulder posture with limited arm swing.  Good heel strike/ clearance.    B LE muscle strength: 5/5 MMT.   Cervical AROM (seated): flexion (65 deg.)/ extension (20 deg.)/  R rotn. (31 deg.), L rotn. (32 deg.)/ R lat. Flexion (8 deg.)/ L lat. Flexion (13 deg.).     TREATMENT DATE:  09/14/23                  Subjective:  Pt. Has not returned to using trekking poles with walking.  Pt. Currently reports no pain but had increase LBP this past weekend.  Pt. Had to take a Hydrocodone to manage pain.  Pt. Is still loving new bed.     There.ex.:   Nustep L5 10 min. B UE/LE (use of MH to low back during Nustep).    Resisted gait 2BTB (10x) all 4-planes in //-bars with mirror feedback .   Nautilus: 30# seated blue ball lat. Pull down 20x/ 30# tricep ext. 20x.    Standing GTB horizontal abduction 20x.  Banker.    Standing hip abduction/ marching in //-bars with upright posture.    Walking in clinic/ hallway after tx. Session.    Manual tx.:  Seated STM to thoracic/lumbar paraspinals with use of Hypervolt.    Seated thoracic rotn./ reassessment of spinal mobility.    PATIENT EDUCATION:  Education details: Aquatic ex./ stretches/ HEP Person educated: Patient Education method: Medical illustrator Education comprehension: verbalized understanding and returned demonstration   HOME EXERCISE PROGRAM: TrA ex. Program (page #1-3)- pain limited with bridging.  Modified with bolster to assist with less pain.     ASSESSMENT:   CLINICAL IMPRESSION: Pt. Presents with marked spinal ROM limitations and strength deficits.  Pt. Works hard during PT tx. Session and progressing to more resisted there.ex with use of Nautilus.  Good balance/ posture while sitting on blue ball and incorporating Nautilus.   Pt. Will benefit from skilled PT services to develop HEP to increase generalized strength to improve pain-free functional mobility.       OBJECTIVE IMPAIRMENTS: Abnormal gait, decreased activity tolerance, decreased endurance, decreased mobility, difficulty walking, decreased ROM, decreased strength, impaired flexibility, improper body mechanics, postural  dysfunction, and pain.    ACTIVITY LIMITATIONS: carrying, lifting, bending, standing, squatting, transfers, and locomotion level   PARTICIPATION LIMITATIONS: cleaning, laundry, driving, shopping, and community activity   PERSONAL FACTORS: Fitness and Past/current experiences are also affecting patient's functional outcome.    REHAB POTENTIAL: Good   CLINICAL DECISION MAKING: Evolving/moderate complexity   EVALUATION COMPLEXITY: Moderate     GOALS: Goals reviewed with patient? Yes  LONG TERM GOALS: Target date: 11/09/23   Pt. Will increase FOTO to 50 to improve pain-free mobility.   Baseline: 37 Goal status: On-going   2.  Pt. Will increase lumbar AROM 25% to improve functional mobility/ household tasks.   Baseline: See above Goal status: Not met   3.  Pt. Will report <5/10 back pain at worst with daily household tasks.   Baseline: 8/10 pain at worst Goal status: Not met   4.  Pt. Will increase B hip/LE muscle strength to 4+/5 MMT to improve standing/walking tasks.   Baseline: see above Goal status:  Goal met   PLAN:   PT FREQUENCY: 2x/week   PT DURATION: 6 weeks   PLANNED INTERVENTIONS:  97110-Therapeutic exercises, 97530- Therapeutic activity, [id number]- Neuromuscular re-education, [phone]- Self Care, 60454- Manual therapy, [phone]- Gait training, [phone]- Aquatic Therapy, Patient/Family education, Balance training, Stair training, Dry Needling, Joint mobilization, Cryotherapy, and Moist heat.   PLAN FOR NEXT SESSION: Core strengthening/ endurance training.     Lendell Quarry, PT, DPT # [id number] 09/17/2023, 6:23 PM

## 2023-09-17 NOTE — Therapy (Addendum)
 OUTPATIENT PHYSICAL THERAPY THORACOLUMBAR TREATMENT  Patient Name: Jennifer Morrow MRN: [id number] DOB:Oct 20, 1950, 73 y.o., female Today's Date: 09/16/2023  END OF SESSION:  PT End of Session - 09/17/23 1826     Visit Number 14    Number of Visits 29    Date for PT Re-Evaluation 11/09/23    PT Start Time 0856    PT Stop Time 0947    PT Time Calculation (min) 51 min    Activity Tolerance Patient tolerated treatment well;Patient limited by pain    Behavior During Therapy [facility] for tasks assessed/performed             Past Medical History:  Diagnosis Date   Arthritis    Bronchitis    CHRONIC   Bulging lumbar disc    degenerative dics   Scoliosis    LIMITS LUNG FUNCTION   Spinal stenosis    Past Surgical History:  Procedure Laterality Date   CATARACT EXTRACTION W/PHACO Left 01/06/2018   Procedure: CATARACT EXTRACTION PHACO AND INTRAOCULAR LENS PLACEMENT (IOC);  Surgeon: Rosa College, MD;  Location: [facility] OR[facility];  Service: Ophthalmology;  Laterality: Left;  US  00:27.5 AP% 4.3 CDE 1.18 FLUID PACK LOT # [id number]   CATARACT EXTRACTION W/PHACO Right 02/10/2018   Procedure: CATARACT EXTRACTION PHACO AND INTRAOCULAR LENS PLACEMENT (IOC);  Surgeon: Rosa College, MD;  Location: [facility] ORS;  Service: Ophthalmology;  Laterality: Right;  US  00:30.5 AP% 5.0 CDE 1.51  Fluid pack lot # [id number] H   FOOT X 2     HEEL SPUR EXCISION     TONSILLECTOMY     Patient Active Problem List   Diagnosis Date Noted   Elevated liver enzymes 06/14/2023   Osteoarthritis cervical spine 01/14/2022   Age-related osteoporosis without current pathological fracture 05/19/2021   Spondylosis of lumbar region without myelopathy or radiculopathy 05/19/2021   Degenerative disc disease, lumbar 05/19/2021   Scoliosis, unspecified 01/09/2015   Vitamin D  deficiency 01/08/2014   Mixed hyperlipidemia 01/08/2014   PCP: [name], MD  REFERRING PROVIDER: [name], MD  REFERRING DIAG: M41.9  (ICD-10-CM) - Scoliosis, unspecified   Rationale for Evaluation and Treatment: Rehabilitation  THERAPY DIAG:  Other idiopathic scoliosis, lumbar region  Joint stiffness of spine  Muscle weakness (generalized)  ONSET DATE: chronic                                                                                                                                                                                        SUBJECTIVE:  SUBJECTIVE STATEMENT: Pt. Reports chronic low back pain everyday.  Pt. Uses MH and hot tub daily to manage symptoms.  Increase pain with prolonged standing.  Pt. Returned to [place]/ [place] after a 19 day trip (07/11/23).  Pt. Planning to return to pool at [employer] 2x/week, Yoga 1x/week, personal trainer 1x/week.  Pt. Is considering major back surgery with Dr. [name] (f/u 08/31/23) if bone density is improved.  Pt. Reports L LE/calf/foot N/T and weakness (no falls reported).  Pt. Wants to return to walking with trekking poles and no back symptoms.     PERTINENT HISTORY:  Pt. Well known to PT clinic.     PAIN:  Are you having pain? Yes: NPRS scale: 2/10 Pain location: low back Pain description: aching Aggravating factors: increase activity/ prolonged standing and walking Relieving factors: heat (hot tub use)   PRECAUTIONS: Back   RED FLAGS: None      WEIGHT BEARING RESTRICTIONS: No   FALLS:  Has patient fallen in last 6 months? No   LIVING ENVIRONMENT: Lives with: lives alone Lives in: House/apartment Has following equipment at home: Single point cane   OCCUPATION: Retired   PLOF: Independent   PATIENT GOALS: Increase activity during the day with less back pain.     NEXT MD VISIT: 08/31/23   OBJECTIVE:  Note: Objective measures were completed at Evaluation unless  otherwise noted.   DIAGNOSTIC FINDINGS:  See imaging   PATIENT SURVEYS:  FOTO initial 37/ goal 50   COGNITION: Overall cognitive status: Within functional limits for tasks assessed                          SENSATION: L LE N/T   MUSCLE LENGTH: Hamstrings: Right 85 deg; Left 82 deg   POSTURE: rounded shoulders and forward head.  Moderate scoliosis (L to R).     PALPATION: (+) tenderness in R low thoracic/lumbar spine (hypomobile).     LUMBAR ROM:    AROM eval  Flexion 25% limited  Extension 75% limited  Right lateral flexion 50% limited  Left lateral flexion 50% limited  Right rotation 50% limited  Left rotation 50% limited   (Blank rows = not tested)   LOWER EXTREMITY ROM:      Active  Right eval Left eval  Hip flexion 98 deg. 100 deg.  Hip extension      Hip abduction      Hip adduction      Hip internal rotation 30 deg. 30 deg.  Hip external rotation      Knee flexion [facility] WFL  Knee extension [facility]  Ankle dorsiflexion      Ankle plantarflexion      Ankle inversion      Ankle eversion       (Blank rows = not tested)   LOWER EXTREMITY MMT:     MMT Right eval Left eval  Hip flexion 3/5 3/5  Hip extension      Hip abduction 3+ 3+  Hip adduction      Hip internal rotation 3+ 3+  Hip external rotation 3+ 3+  Knee flexion 4 4  Knee extension 4 4  Ankle dorsiflexion 4 4  Ankle plantarflexion      Ankle inversion      Ankle eversion       (Blank rows = not tested)   LUMBAR SPECIAL TESTS:  FABER test: Negative   FUNCTIONAL TESTS:  5 times sit to stand: TBD  GAIT: Distance walked: in clinic Assistive device utilized: None Level of assistance: Modified independence Comments: Pt. Ambulates with consistent recip. Gait pattern with no assistive devices.  Pt. Has rounded shoulder posture with limited arm swing.  Good heel strike/ clearance.    B LE muscle strength: 5/5 MMT.   Cervical AROM (seated): flexion (65 deg.)/ extension (20 deg.)/ R  rotn. (31 deg.), L rotn. (32 deg.)/ R lat. Flexion (8 deg.)/ L lat. Flexion (13 deg.).     TREATMENT DATE:  09/16/23                  Subjective:  Pt. Is considering back surgery in mid-July.  Pt. Planning to f/u with MD to discuss.  No new complaints this week.     There.ex.:   Nustep L5 10 min. B UE/LE (use of MH to low back during Nustep).    Hallway walking with added alt. UE/LE touches  Resisted gait 2BTB (10x) all 4-planes in //-bars with mirror feedback.  Nautilus: 40# Pallof press/ 30# lat. Pull down 20x/ 30# lat. Pull downs 20x.    Standing GTB hip 3-way ex. In //-bar.      Manual tx.:  Supine LE/lumbar generalized stretches.   Seated STM to thoracic/lumbar paraspinals with use of Hypervolt.    PATIENT EDUCATION:  Education details: Aquatic ex./ stretches/ HEP Person educated: Patient Education method: Medical illustrator Education comprehension: verbalized understanding and returned demonstration   HOME EXERCISE PROGRAM: TrA ex. Program (page #1-3)- pain limited with bridging.  Modified with bolster to assist with less pain.     ASSESSMENT:   CLINICAL IMPRESSION: Pt. Presents with marked spinal ROM limitations and strength deficits.  Pt. Works hard during PT tx. Session and progressing to more resisted there.ex with use of Nautilus.  No increase c/o back pain during tx. Session but several seated rest breaks.   Pt. Will benefit from skilled PT services to develop HEP to increase generalized strength to improve pain-free functional mobility.       OBJECTIVE IMPAIRMENTS: Abnormal gait, decreased activity tolerance, decreased endurance, decreased mobility, difficulty walking, decreased ROM, decreased strength, impaired flexibility, improper body mechanics, postural dysfunction, and pain.    ACTIVITY LIMITATIONS: carrying, lifting, bending, standing, squatting, transfers, and locomotion level   PARTICIPATION LIMITATIONS: cleaning, laundry, driving, shopping,  and community activity   PERSONAL FACTORS: Fitness and Past/current experiences are also affecting patient's functional outcome.    REHAB POTENTIAL: Good   CLINICAL DECISION MAKING: Evolving/moderate complexity   EVALUATION COMPLEXITY: Moderate     GOALS: Goals reviewed with patient? Yes  LONG TERM GOALS: Target date: 11/09/23   Pt. Will increase FOTO to 50 to improve pain-free mobility.   Baseline: 37 Goal status: On-going   2.  Pt. Will increase lumbar AROM 25% to improve functional mobility/ household tasks.   Baseline: See above Goal status: Not met   3.  Pt. Will report <5/10 back pain at worst with daily household tasks.   Baseline: 8/10 pain at worst Goal status: Not met   4.  Pt. Will increase B hip/LE muscle strength to 4+/5 MMT to improve standing/walking tasks.   Baseline: see above Goal status:  Goal met   PLAN:   PT FREQUENCY: 2x/week   PT DURATION: 6 weeks   PLANNED INTERVENTIONS: 97110-Therapeutic exercises, 97530- Therapeutic activity, 97112- Neuromuscular re-education, 97535- Self Care, 57846- Manual therapy, [phone]- Gait training, [phone]- Aquatic Therapy, Patient/Family education, Balance training, Stair training, Dry Needling, Joint mobilization, Cryotherapy, and Moist heat.  PLAN FOR NEXT SESSION: Core strengthening/ endurance training.    Lendell Quarry, PT, DPT # [phone] 09/17/2023, 6:29 PM

## 2023-09-20 ENCOUNTER — Encounter: Payer: Self-pay | Admitting: Physical Therapy

## 2023-09-20 NOTE — Addendum Note (Signed)
Addended by: Cammie Mcgee on: 09/20/2023 09:39 AM   Modules accepted: Orders

## 2023-09-21 ENCOUNTER — Encounter: Payer: Medicare HMO | Admitting: Physical Therapy

## 2023-09-21 DIAGNOSIS — M810 Age-related osteoporosis without current pathological fracture: Secondary | ICD-10-CM | POA: Diagnosis not present

## 2023-09-23 ENCOUNTER — Ambulatory Visit: Payer: Medicare HMO | Attending: Spine Surgery | Admitting: Physical Therapy

## 2023-09-23 DIAGNOSIS — M6281 Muscle weakness (generalized): Secondary | ICD-10-CM | POA: Insufficient documentation

## 2023-09-23 DIAGNOSIS — M2560 Stiffness of unspecified joint, not elsewhere classified: Secondary | ICD-10-CM | POA: Insufficient documentation

## 2023-09-23 DIAGNOSIS — M4126 Other idiopathic scoliosis, lumbar region: Secondary | ICD-10-CM | POA: Diagnosis not present

## 2023-09-25 NOTE — Therapy (Addendum)
 OUTPATIENT PHYSICAL THERAPY THORACOLUMBAR TREATMENT  Patient Name: Jennifer Morrow MRN: [id number] DOB:1950-09-01, 73 y.o., female Today's Date: 09/23/2023  END OF SESSION:  PT End of Session - 09/25/23 1343     Visit Number 15    Number of Visits 29    Date for PT Re-Evaluation 11/09/23    PT Start Time 0856    PT Stop Time 0946    PT Time Calculation (min) 50 min    Activity Tolerance Patient tolerated treatment well;Patient limited by pain    Behavior During Therapy [facility] for tasks assessed/performed             Past Medical History:  Diagnosis Date   Arthritis    Bronchitis    CHRONIC   Bulging lumbar disc    degenerative dics   Scoliosis    LIMITS LUNG FUNCTION   Spinal stenosis    Past Surgical History:  Procedure Laterality Date   CATARACT EXTRACTION W/PHACO Left 01/06/2018   Procedure: CATARACT EXTRACTION PHACO AND INTRAOCULAR LENS PLACEMENT (IOC);  Surgeon: Rosa College, MD;  Location: [facility] ORS;  Service: Ophthalmology;  Laterality: Left;  US  00:27.5 AP% 4.3 CDE 1.18 FLUID PACK LOT # [id number]   CATARACT EXTRACTION W/PHACO Right 02/10/2018   Procedure: CATARACT EXTRACTION PHACO AND INTRAOCULAR LENS PLACEMENT (IOC);  Surgeon: Rosa College, MD;  Location: [facility] ORS;  Service: Ophthalmology;  Laterality: Right;  US  00:30.5 AP% 5.0 CDE 1.51  Fluid pack lot # [id number]   FOOT X 2     HEEL SPUR EXCISION     TONSILLECTOMY     Patient Active Problem List   Diagnosis Date Noted   Elevated liver enzymes 06/14/2023   Osteoarthritis cervical spine 01/14/2022   Age-related osteoporosis without current pathological fracture 05/19/2021   Spondylosis of lumbar region without myelopathy or radiculopathy 05/19/2021   Degenerative disc disease, lumbar 05/19/2021   Scoliosis, unspecified 01/09/2015   Vitamin D  deficiency 01/08/2014   Mixed hyperlipidemia 01/08/2014    PCP: [name], MD  REFERRING PROVIDER: [name], MD  REFERRING DIAG: M41.9  (ICD-10-CM) - Scoliosis, unspecified   Rationale for Evaluation and Treatment: Rehabilitation  THERAPY DIAG:  Other idiopathic scoliosis, lumbar region  Joint stiffness of spine  Muscle weakness (generalized)  ONSET DATE: chronic                                                                                                                                                                                        SUBJECTIVE:  SUBJECTIVE STATEMENT: Pt. Reports chronic low back pain everyday.  Pt. Uses MH and hot tub daily to manage symptoms.  Increase pain with prolonged standing.  Pt. Returned to [place]/ [place] after a 19 day trip (07/11/23).  Pt. Planning to return to pool at [employer] 2x/week, Yoga 1x/week, personal trainer 1x/week.  Pt. Is considering major back surgery with Dr. [name] (f/u 08/31/23) if bone density is improved.  Pt. Reports L LE/calf/foot N/T and weakness (no falls reported).  Pt. Wants to return to walking with trekking poles and no back symptoms.     PERTINENT HISTORY:  Pt. Well known to PT clinic.     PAIN:  Are you having pain? Yes: NPRS scale: 2/10 Pain location: low back Pain description: aching Aggravating factors: increase activity/ prolonged standing and walking Relieving factors: heat (hot tub use)   PRECAUTIONS: Back   RED FLAGS: None      WEIGHT BEARING RESTRICTIONS: No   FALLS:  Has patient fallen in last 6 months? No   LIVING ENVIRONMENT: Lives with: lives alone Lives in: House/apartment Has following equipment at home: Single point cane   OCCUPATION: Retired   PLOF: Independent   PATIENT GOALS: Increase activity during the day with less back pain.     NEXT MD VISIT: 08/31/23   OBJECTIVE:  Note: Objective measures were completed at Evaluation unless  otherwise noted.   DIAGNOSTIC FINDINGS:  See imaging   PATIENT SURVEYS:  FOTO initial 37/ goal 50   COGNITION: Overall cognitive status: Within functional limits for tasks assessed                          SENSATION: L LE N/T   MUSCLE LENGTH: Hamstrings: Right 85 deg; Left 82 deg   POSTURE: rounded shoulders and forward head.  Moderate scoliosis (L to R).     PALPATION: (+) tenderness in R low thoracic/lumbar spine (hypomobile).     LUMBAR ROM:    AROM eval  Flexion 25% limited  Extension 75% limited  Right lateral flexion 50% limited  Left lateral flexion 50% limited  Right rotation 50% limited  Left rotation 50% limited   (Blank rows = not tested)   LOWER EXTREMITY ROM:      Active  Right eval Left eval  Hip flexion 98 deg. 100 deg.  Hip extension      Hip abduction      Hip adduction      Hip internal rotation 30 deg. 30 deg.  Hip external rotation      Knee flexion [facility] WFL  Knee extension [facility]  Ankle dorsiflexion      Ankle plantarflexion      Ankle inversion      Ankle eversion       (Blank rows = not tested)   LOWER EXTREMITY MMT:     MMT Right eval Left eval  Hip flexion 3/5 3/5  Hip extension      Hip abduction 3+ 3+  Hip adduction      Hip internal rotation 3+ 3+  Hip external rotation 3+ 3+  Knee flexion 4 4  Knee extension 4 4  Ankle dorsiflexion 4 4  Ankle plantarflexion      Ankle inversion      Ankle eversion       (Blank rows = not tested)   LUMBAR SPECIAL TESTS:  FABER test: Negative   FUNCTIONAL TESTS:  5 times sit to stand: TBD  GAIT: Distance walked: in clinic Assistive device utilized: None Level of assistance: Modified independence Comments: Pt. Ambulates with consistent recip. Gait pattern with no assistive devices.  Pt. Has rounded shoulder posture with limited arm swing.  Good heel strike/ clearance.    B LE muscle strength: 5/5 MMT.   Cervical AROM (seated): flexion (65 deg.)/ extension (20 deg.)/ R  rotn. (31 deg.), L rotn. (32 deg.)/ R lat. Flexion (8 deg.)/ L lat. Flexion (13 deg.).     TREATMENT DATE:  09/23/23                  Subjective:  Pt. Reports 2/10 low back pain this morning.  Pt. States she had a busy weekend with caused her to require pain meds/ lie in bed.     There.ex.:   Nustep L5 10 min. B UE/LE (use of MH to low back during Nustep).    Hallway walking with added alt. UE/LE touches  Resisted gait 2BTB (10x) all 4-planes in //-bars with mirror feedback.  Standing GTB: bicep curls/ ER "W"/ horizontal abduction 20x.    Manual tx.:  Supine LE/lumbar generalized stretches.   Seated STM to thoracic/lumbar paraspinals with use of Hypervolt.    Not today: Nautilus: 40# Pallof press/ 30# lat. Pull down 20x/ 30# lat. Pull downs 20x.    PATIENT EDUCATION:  Education details: Aquatic ex./ stretches/ HEP Person educated: Patient Education method: Medical illustrator Education comprehension: verbalized understanding and returned demonstration   HOME EXERCISE PROGRAM: TrA ex. Program (page #1-3)- pain limited with bridging.  Modified with bolster to assist with less pain.     ASSESSMENT:   CLINICAL IMPRESSION: Pt. Presents with marked spinal ROM limitations and strength deficits.  Pt. Works hard during PT tx. Session and progressing to more resisted there.ex with use of Nautilus.  No increase c/o back pain during tx. Session but several seated rest breaks.   Pt. Will benefit from skilled PT services to develop HEP to increase generalized strength to improve pain-free functional mobility.       OBJECTIVE IMPAIRMENTS: Abnormal gait, decreased activity tolerance, decreased endurance, decreased mobility, difficulty walking, decreased ROM, decreased strength, impaired flexibility, improper body mechanics, postural dysfunction, and pain.    ACTIVITY LIMITATIONS: carrying, lifting, bending, standing, squatting, transfers, and locomotion level   PARTICIPATION  LIMITATIONS: cleaning, laundry, driving, shopping, and community activity   PERSONAL FACTORS: Fitness and Past/current experiences are also affecting patient's functional outcome.    REHAB POTENTIAL: Good   CLINICAL DECISION MAKING: Evolving/moderate complexity   EVALUATION COMPLEXITY: Moderate     GOALS: Goals reviewed with patient? Yes  LONG TERM GOALS: Target date: 11/09/23   Pt. Will increase FOTO to 50 to improve pain-free mobility.   Baseline: 37 Goal status: On-going   2.  Pt. Will increase lumbar AROM 25% to improve functional mobility/ household tasks.   Baseline: See above Goal status: Not met   3.  Pt. Will report <5/10 back pain at worst with daily household tasks.   Baseline: 8/10 pain at worst Goal status: Not met   4.  Pt. Will increase B hip/LE muscle strength to 4+/5 MMT to improve standing/walking tasks.   Baseline: see above Goal status:  Goal met   PLAN:   PT FREQUENCY: 2x/week   PT DURATION: 6 weeks   PLANNED INTERVENTIONS: 97110-Therapeutic exercises, 97530- Therapeutic activity, [id number]- Neuromuscular re-education, 97535- Self Care, 40981- Manual therapy, [phone]- Gait training, [phone]- Aquatic Therapy, Patient/Family education, Balance training, Stair training, Dry Needling,  Joint mobilization, Cryotherapy, and Moist heat.   PLAN FOR NEXT SESSION: Core strengthening/ endurance training.    Lendell Quarry, PT, DPT # [phone] 09/25/2023, 1:44 PM

## 2023-09-28 ENCOUNTER — Ambulatory Visit: Payer: Medicare HMO | Admitting: Physical Therapy

## 2023-09-28 DIAGNOSIS — M4126 Other idiopathic scoliosis, lumbar region: Secondary | ICD-10-CM

## 2023-09-28 DIAGNOSIS — M6281 Muscle weakness (generalized): Secondary | ICD-10-CM | POA: Diagnosis not present

## 2023-09-28 DIAGNOSIS — M2560 Stiffness of unspecified joint, not elsewhere classified: Secondary | ICD-10-CM | POA: Diagnosis not present

## 2023-09-28 DIAGNOSIS — R29898 Other symptoms and signs involving the musculoskeletal system: Secondary | ICD-10-CM | POA: Diagnosis not present

## 2023-09-28 DIAGNOSIS — M5442 Lumbago with sciatica, left side: Secondary | ICD-10-CM | POA: Diagnosis not present

## 2023-09-28 DIAGNOSIS — M4150 Other secondary scoliosis, site unspecified: Secondary | ICD-10-CM | POA: Diagnosis not present

## 2023-09-28 DIAGNOSIS — M419 Scoliosis, unspecified: Secondary | ICD-10-CM | POA: Diagnosis not present

## 2023-09-28 DIAGNOSIS — G8929 Other chronic pain: Secondary | ICD-10-CM | POA: Diagnosis not present

## 2023-09-30 ENCOUNTER — Ambulatory Visit: Payer: Medicare HMO | Admitting: Physical Therapy

## 2023-09-30 DIAGNOSIS — M4126 Other idiopathic scoliosis, lumbar region: Secondary | ICD-10-CM

## 2023-09-30 DIAGNOSIS — M2560 Stiffness of unspecified joint, not elsewhere classified: Secondary | ICD-10-CM

## 2023-09-30 DIAGNOSIS — M6281 Muscle weakness (generalized): Secondary | ICD-10-CM

## 2023-10-04 DIAGNOSIS — M47816 Spondylosis without myelopathy or radiculopathy, lumbar region: Secondary | ICD-10-CM | POA: Diagnosis not present

## 2023-10-04 DIAGNOSIS — M48061 Spinal stenosis, lumbar region without neurogenic claudication: Secondary | ICD-10-CM | POA: Diagnosis not present

## 2023-10-05 ENCOUNTER — Ambulatory Visit: Payer: Medicare HMO | Admitting: Physical Therapy

## 2023-10-05 DIAGNOSIS — M4126 Other idiopathic scoliosis, lumbar region: Secondary | ICD-10-CM

## 2023-10-05 DIAGNOSIS — M6281 Muscle weakness (generalized): Secondary | ICD-10-CM | POA: Diagnosis not present

## 2023-10-05 DIAGNOSIS — M2560 Stiffness of unspecified joint, not elsewhere classified: Secondary | ICD-10-CM

## 2023-10-07 ENCOUNTER — Ambulatory Visit: Payer: Medicare HMO | Admitting: Physical Therapy

## 2023-10-09 NOTE — Therapy (Addendum)
 OUTPATIENT PHYSICAL THERAPY THORACOLUMBAR TREATMENT  Patient Name: Jennifer Morrow MRN: [id number] DOB:[birth year], 73 y.o., female Today's Date: 10/05/2023  END OF SESSION:  PT End of Session - 10/09/23 1939      Visit Number 18     Number of Visits 29     Date for PT Re-Evaluation 11/09/23     PT Start Time 0945    PT Stop Time 1031    PT Time Calculation (min) 46 min     Past Medical History:  Diagnosis Date   Arthritis    Bronchitis    CHRONIC   Bulging lumbar disc    degenerative dics   Scoliosis    LIMITS LUNG FUNCTION   Spinal stenosis    Past Surgical History:  Procedure Laterality Date   CATARACT EXTRACTION W/PHACO Left 01/06/2018   Procedure: CATARACT EXTRACTION PHACO AND INTRAOCULAR LENS PLACEMENT (IOC);  Surgeon: Rosa College, MD;  Location: [facility] ORS;  Service: Ophthalmology;  Laterality: Left;  US  00:27.5 AP% 4.3 CDE 1.18 FLUID PACK LOT # [id number]   CATARACT EXTRACTION W/PHACO Right 02/10/2018   Procedure: CATARACT EXTRACTION PHACO AND INTRAOCULAR LENS PLACEMENT (IOC);  Surgeon: Rosa College, MD;  Location: [facility] ORS;  Service: Ophthalmology;  Laterality: Right;  US  00:30.5 AP% 5.0 CDE 1.51  Fluid pack lot # [id number]   FOOT X 2     HEEL SPUR EXCISION     TONSILLECTOMY     Patient Active Problem List   Diagnosis Date Noted   Elevated liver enzymes 06/14/2023   Osteoarthritis cervical spine 01/14/2022   Age-related osteoporosis without current pathological fracture 05/19/2021   Spondylosis of lumbar region without myelopathy or radiculopathy 05/19/2021   Degenerative disc disease, lumbar 05/19/2021   Scoliosis, unspecified 01/09/2015   Vitamin D  deficiency 01/08/2014   Mixed hyperlipidemia 01/08/2014    PCP: [name], MD  REFERRING PROVIDER: [name], MD  REFERRING DIAG: M41.9 (ICD-10-CM) - Scoliosis, unspecified   Rationale for Evaluation and Treatment: Rehabilitation  THERAPY DIAG:  Other idiopathic scoliosis, lumbar  region  Joint stiffness of spine  Muscle weakness (generalized)  ONSET DATE: chronic     PCP: Rococs, Jessee Mormon, MD  REFERRING PROVIDER: [name], MD  REFERRING DIAG: M41.9 (ICD-10-CM) - Scoliosis, unspecified   Rationale for Evaluation and Treatment: Rehabilitation  THERAPY DIAG:  Other idiopathic scoliosis, lumbar region  Joint stiffness of spine  Muscle weakness (generalized)  ONSET DATE: chronic  SUBJECTIVE:                                                                                                                                                                                            SUBJECTIVE STATEMENT: Pt. Reports chronic low back pain everyday.  Pt. Uses MH and hot tub daily to manage symptoms.  Increase pain with prolonged standing.  Pt. Returned to [place]/ [place] after a 19 day trip (07/11/23).  Pt. Planning to return to pool at [employer] 2x/week, Yoga 1x/week, personal trainer 1x/week.  Pt. Is considering major back surgery with Dr. [name] (f/u 08/31/23) if bone density is improved.  Pt. Reports L LE/calf/foot N/T and weakness (no falls reported).  Pt. Wants to return to walking with trekking poles and no back symptoms.     PERTINENT HISTORY:  Pt. Well known to PT clinic.     PAIN:  Are you having pain? Yes: NPRS scale: 2/10 Pain location: low back Pain description: aching Aggravating factors: increase activity/ prolonged standing and walking Relieving factors: heat (hot tub use)   PRECAUTIONS: Back   RED FLAGS: None      WEIGHT BEARING RESTRICTIONS: No   FALLS:  Has patient fallen in last 6 months? No   LIVING ENVIRONMENT: Lives with: lives alone Lives in: House/apartment Has following equipment at home: Single point cane   OCCUPATION: Retired   PLOF:  Independent   PATIENT GOALS: Increase activity during the day with less back pain.     NEXT MD VISIT: 08/31/23   OBJECTIVE:  Note: Objective measures were completed at Evaluation unless otherwise noted.   DIAGNOSTIC FINDINGS:  See imaging   PATIENT SURVEYS:  FOTO initial 37/ goal 50   COGNITION: Overall cognitive status: Within functional limits for tasks assessed                          SENSATION: L LE N/T   MUSCLE LENGTH: Hamstrings: Right 85 deg; Left 82 deg   POSTURE: rounded shoulders and forward head.  Moderate scoliosis (L to R).     PALPATION: (+) tenderness in R low thoracic/lumbar spine (hypomobile).     LUMBAR ROM:    AROM eval  Flexion 25% limited  Extension 75% limited  Right lateral flexion 50% limited  Left lateral flexion 50% limited  Right rotation 50% limited  Left rotation 50% limited   (Blank rows = not tested)   LOWER EXTREMITY ROM:      Active  Right eval Left eval  Hip flexion 98 deg. 100 deg.  Hip extension      Hip  abduction      Hip adduction      Hip internal rotation 30 deg. 30 deg.  Hip external rotation      Knee flexion [facility] WFL  Knee extension [facility]  Ankle dorsiflexion      Ankle plantarflexion      Ankle inversion      Ankle eversion       (Blank rows = not tested)   LOWER EXTREMITY MMT:     MMT Right eval Left eval  Hip flexion 3/5 3/5  Hip extension      Hip abduction 3+ 3+  Hip adduction      Hip internal rotation 3+ 3+  Hip external rotation 3+ 3+  Knee flexion 4 4  Knee extension 4 4  Ankle dorsiflexion 4 4  Ankle plantarflexion      Ankle inversion      Ankle eversion       (Blank rows = not tested)   LUMBAR SPECIAL TESTS:  FABER test: Negative   FUNCTIONAL TESTS:  5 times sit to stand: TBD   GAIT: Distance walked: in clinic Assistive device utilized: None Level of assistance: Modified independence Comments: Pt. Ambulates with consistent recip. Gait pattern with no assistive devices.  Pt.  Has rounded shoulder posture with limited arm swing.  Good heel strike/ clearance.    B LE muscle strength: 5/5 MMT.   Cervical AROM (seated): flexion (65 deg.)/ extension (20 deg.)/ R rotn. (31 deg.), L rotn. (32 deg.)/ R lat. Flexion (8 deg.)/ L lat. Flexion (13 deg.).     TREATMENT DATE:  09/30/23                  Subjective:  Pt. States injection is on 10/15/23.  No subjective pain score given today.     There.ex.:   Nustep L5 10 min. B UE/LE (use of MH to low back during Nustep).    Resisted gait 2BTB (10x) all 4-planes in //-bars with mirror feedback.  Quadruped ex.: cat-cow/ pelvic tilt  Seated blue ball ex. At mirror: marching/ wt. Shifting/ LAQ/ Alt. UE and LE 20x each.    Discussed HEP.    Manual tx.:  Supine LE/lumbar generalized stretches.   Seated STM to thoracic/lumbar paraspinals with use of Hypervolt.      PATIENT EDUCATION:  Education details: Aquatic ex./ stretches/ HEP Person educated: Patient Education method: Medical illustrator Education comprehension: verbalized understanding and returned demonstration   HOME EXERCISE PROGRAM: TrA ex. Program (page #1-3)- pain limited with bridging.  Modified with bolster to assist with less pain.     ASSESSMENT:   CLINICAL IMPRESSION: Pt. Presents with marked spinal ROM limitations and strength deficits.  Pt. Works hard during PT tx. Session and progressing to more resisted there.ex with use of Nautilus.  No increase c/o back pain during tx. Session but several seated rest breaks.  Probable discharge next tx. Session with focus on independent ex. Program.  Pt. Will benefit from skilled PT services to develop HEP to increase generalized strength to improve pain-free functional mobility.       OBJECTIVE IMPAIRMENTS: Abnormal gait, decreased activity tolerance, decreased endurance, decreased mobility, difficulty walking, decreased ROM, decreased strength, impaired flexibility, improper body mechanics, postural  dysfunction, and pain.    ACTIVITY LIMITATIONS: carrying, lifting, bending, standing, squatting, transfers, and locomotion level   PARTICIPATION LIMITATIONS: cleaning, laundry, driving, shopping, and community activity   PERSONAL FACTORS: Fitness and Past/current experiences are also affecting patient's functional outcome.  REHAB POTENTIAL: Good   CLINICAL DECISION MAKING: Evolving/moderate complexity   EVALUATION COMPLEXITY: Moderate     GOALS: Goals reviewed with patient? Yes  LONG TERM GOALS: Target date: 11/09/23   Pt. Will increase FOTO to 50 to improve pain-free mobility.   Baseline: 37 Goal status: On-going   2.  Pt. Will increase lumbar AROM 25% to improve functional mobility/ household tasks.   Baseline: See above Goal status: Not met   3.  Pt. Will report <5/10 back pain at worst with daily household tasks.   Baseline: 8/10 pain at worst Goal status: Not met   4.  Pt. Will increase B hip/LE muscle strength to 4+/5 MMT to improve standing/walking tasks.   Baseline: see above Goal status:  Goal met   PLAN:   PT FREQUENCY: 2x/week   PT DURATION: 6 weeks   PLANNED INTERVENTIONS: 97110-Therapeutic exercises, 97530- Therapeutic activity, 97112- Neuromuscular re-education, 97535- Self Care, 16109- Manual therapy, [phone]- Gait training, [phone]- Aquatic Therapy, Patient/Family education, Balance training, Stair training, Dry Needling, Joint mobilization, Cryotherapy, and Moist heat.   PLAN FOR NEXT SESSION:  Probable discharge next visit.                                                                                                                                                                                        Lendell Quarry, PT, DPT # [phone] 10/09/2023, 6:37 PM

## 2023-10-09 NOTE — Therapy (Addendum)
 OUTPATIENT PHYSICAL THERAPY THORACOLUMBAR TREATMENT  Patient Name: Jennifer Morrow MRN: [id number] DOB:Jul 17, 1951, 73 y.o., female Today's Date: 09/30/2023  END OF SESSION:  PT End of Session - 10/09/23 1833     Visit Number 17    Number of Visits 29    Date for PT Re-Evaluation 11/09/23    PT Start Time 0901    PT Stop Time 0950    PT Time Calculation (min) 49 min             Past Medical History:  Diagnosis Date   Arthritis    Bronchitis    CHRONIC   Bulging lumbar disc    degenerative dics   Scoliosis    LIMITS LUNG FUNCTION   Spinal stenosis    Past Surgical History:  Procedure Laterality Date   CATARACT EXTRACTION W/PHACO Left 01/06/2018   Procedure: CATARACT EXTRACTION PHACO AND INTRAOCULAR LENS PLACEMENT (IOC);  Surgeon: Rosa College, MD;  Location: [facility];  Service: Ophthalmology;  Laterality: Left;  US  00:27.5 AP% 4.3 CDE 1.18 FLUID PACK LOT # [id number]   CATARACT EXTRACTION W/PHACO Right 02/10/2018   Procedure: CATARACT EXTRACTION PHACO AND INTRAOCULAR LENS PLACEMENT (IOC);  Surgeon: Rosa College, MD;  Location: [facility];  Service: Ophthalmology;  Laterality: Right;  US  00:30.5 AP% 5.0 CDE 1.51  Fluid pack lot # [id number]   FOOT X 2     HEEL SPUR EXCISION     TONSILLECTOMY     Patient Active Problem List   Diagnosis Date Noted   Elevated liver enzymes 06/14/2023   Osteoarthritis cervical spine 01/14/2022   Age-related osteoporosis without current pathological fracture 05/19/2021   Spondylosis of lumbar region without myelopathy or radiculopathy 05/19/2021   Degenerative disc disease, lumbar 05/19/2021   Scoliosis, unspecified 01/09/2015   Vitamin D  deficiency 01/08/2014   Mixed hyperlipidemia 01/08/2014    PCP: [name], MD  REFERRING PROVIDER: [name], MD  REFERRING DIAG: M41.9 (ICD-10-CM) - Scoliosis, unspecified   Rationale for Evaluation and Treatment: Rehabilitation  THERAPY DIAG:  Other idiopathic scoliosis,  lumbar region  Joint stiffness of spine  Muscle weakness (generalized)  ONSET DATE: chronic                                                                                                                                                                                        SUBJECTIVE:  SUBJECTIVE STATEMENT: Pt. Reports chronic low back pain everyday.  Pt. Uses MH and hot tub daily to manage symptoms.  Increase pain with prolonged standing.  Pt. Returned to [place]/ [place] after a 19 day trip (07/11/23).  Pt. Planning to return to pool at [employer] 2x/week, Yoga 1x/week, personal trainer 1x/week.  Pt. Is considering major back surgery with Dr. [name] (f/u 08/31/23) if bone density is improved.  Pt. Reports L LE/calf/foot N/T and weakness (no falls reported).  Pt. Wants to return to walking with trekking poles and no back symptoms.     PERTINENT HISTORY:  Pt. Well known to PT clinic.     PAIN:  Are you having pain? Yes: NPRS scale: 2/10 Pain location: low back Pain description: aching Aggravating factors: increase activity/ prolonged standing and walking Relieving factors: heat (hot tub use)   PRECAUTIONS: Back   RED FLAGS: None      WEIGHT BEARING RESTRICTIONS: No   FALLS:  Has patient fallen in last 6 months? No   LIVING ENVIRONMENT: Lives with: lives alone Lives in: House/apartment Has following equipment at home: Single point cane   OCCUPATION: Retired   PLOF: Independent   PATIENT GOALS: Increase activity during the day with less back pain.     NEXT MD VISIT: 08/31/23   OBJECTIVE:  Note: Objective measures were completed at Evaluation unless otherwise noted.   DIAGNOSTIC FINDINGS:  See imaging   PATIENT SURVEYS:  FOTO initial 37/ goal 50   COGNITION: Overall cognitive status:  Within functional limits for tasks assessed                          SENSATION: L LE N/T   MUSCLE LENGTH: Hamstrings: Right 85 deg; Left 82 deg   POSTURE: rounded shoulders and forward head.  Moderate scoliosis (L to R).     PALPATION: (+) tenderness in R low thoracic/lumbar spine (hypomobile).     LUMBAR ROM:    AROM eval  Flexion 25% limited  Extension 75% limited  Right lateral flexion 50% limited  Left lateral flexion 50% limited  Right rotation 50% limited  Left rotation 50% limited   (Blank rows = not tested)   LOWER EXTREMITY ROM:      Active  Right eval Left eval  Hip flexion 98 deg. 100 deg.  Hip extension      Hip abduction      Hip adduction      Hip internal rotation 30 deg. 30 deg.  Hip external rotation      Knee flexion [facility] WFL  Knee extension [facility]  Ankle dorsiflexion      Ankle plantarflexion      Ankle inversion      Ankle eversion       (Blank rows = not tested)   LOWER EXTREMITY MMT:     MMT Right eval Left eval  Hip flexion 3/5 3/5  Hip extension      Hip abduction 3+ 3+  Hip adduction      Hip internal rotation 3+ 3+  Hip external rotation 3+ 3+  Knee flexion 4 4  Knee extension 4 4  Ankle dorsiflexion 4 4  Ankle plantarflexion      Ankle inversion      Ankle eversion       (Blank rows = not tested)   LUMBAR SPECIAL TESTS:  FABER test: Negative   FUNCTIONAL TESTS:  5 times sit to stand: TBD  GAIT: Distance walked: in clinic Assistive device utilized: None Level of assistance: Modified independence Comments: Pt. Ambulates with consistent recip. Gait pattern with no assistive devices.  Pt. Has rounded shoulder posture with limited arm swing.  Good heel strike/ clearance.    B LE muscle strength: 5/5 MMT.   Cervical AROM (seated): flexion (65 deg.)/ extension (20 deg.)/ R rotn. (31 deg.), L rotn. (32 deg.)/ R lat. Flexion (8 deg.)/ L lat. Flexion (13 deg.).     TREATMENT DATE:  09/30/23                  Subjective:   Pt. Reports 4/10 low back pain this morning.  Pt. Reports increase back pain today and has pain mgmt. MD appt. Next Monday for 2 injections.     There.ex.:   Nustep L4 10 min. B UE/LE (use of MH to low back during Nustep).    Resisted gait 2BTB (10x) all 4-planes in //-bars with mirror feedback.  Nautilus: 40# Pallof press/ 40# lat. Pull down 20x.   TRX squats 20x (Airex in chair to limited knee flexion/ promote upright posture).   Standing sh. Flexion with weight wand (mirror feedback)  Discussed HEP.    Manual tx.:  Supine LE/lumbar generalized stretches.   Seated STM to thoracic/lumbar paraspinals with use of Hypervolt.      PATIENT EDUCATION:  Education details: Aquatic ex./ stretches/ HEP Person educated: Patient Education method: Medical illustrator Education comprehension: verbalized understanding and returned demonstration   HOME EXERCISE PROGRAM: TrA ex. Program (page #1-3)- pain limited with bridging.  Modified with bolster to assist with less pain.     ASSESSMENT:   CLINICAL IMPRESSION: Pt. Presents with marked spinal ROM limitations and strength deficits.  Pt. Works hard during PT tx. Session and progressing to more resisted there.ex with use of Nautilus.  No increase c/o back pain during tx. Session but several seated rest breaks.   Pt. Will benefit from skilled PT services to develop HEP to increase generalized strength to improve pain-free functional mobility.       OBJECTIVE IMPAIRMENTS: Abnormal gait, decreased activity tolerance, decreased endurance, decreased mobility, difficulty walking, decreased ROM, decreased strength, impaired flexibility, improper body mechanics, postural dysfunction, and pain.    ACTIVITY LIMITATIONS: carrying, lifting, bending, standing, squatting, transfers, and locomotion level   PARTICIPATION LIMITATIONS: cleaning, laundry, driving, shopping, and community activity   PERSONAL FACTORS: Fitness and Past/current  experiences are also affecting patient's functional outcome.    REHAB POTENTIAL: Good   CLINICAL DECISION MAKING: Evolving/moderate complexity   EVALUATION COMPLEXITY: Moderate     GOALS: Goals reviewed with patient? Yes  LONG TERM GOALS: Target date: 11/09/23   Pt. Will increase FOTO to 50 to improve pain-free mobility.   Baseline: 37 Goal status: On-going   2.  Pt. Will increase lumbar AROM 25% to improve functional mobility/ household tasks.   Baseline: See above Goal status: Not met   3.  Pt. Will report <5/10 back pain at worst with daily household tasks.   Baseline: 8/10 pain at worst Goal status: Not met   4.  Pt. Will increase B hip/LE muscle strength to 4+/5 MMT to improve standing/walking tasks.   Baseline: see above Goal status:  Goal met   PLAN:   PT FREQUENCY: 2x/week   PT DURATION: 6 weeks   PLANNED INTERVENTIONS: 97110-Therapeutic exercises, 97530- Therapeutic activity, [id number]- Neuromuscular re-education, 97535- Self Care, [id number]- Manual therapy, [phone]- Gait training, [phone]- Aquatic Therapy, Patient/Family education, Balance training, Stair  training, Dry Needling, Joint mobilization, Cryotherapy, and Moist heat.   PLAN FOR NEXT SESSION: Core strengthening/ endurance training.    Lendell Quarry, PT, DPT # [phone] 10/09/2023, 6:35 PM

## 2023-10-09 NOTE — Therapy (Addendum)
 OUTPATIENT PHYSICAL THERAPY THORACOLUMBAR TREATMENT  Patient Name: Jennifer Morrow MRN: [id number] DOB:[birth year], 73 y.o., female Today's Date: 09/28/2023  END OF SESSION:  PT End of Session - 10/09/23 1831     Visit Number 16    Number of Visits 29    Date for PT Re-Evaluation 11/09/23    PT Start Time 1610    PT Stop Time 1701    PT Time Calculation (min) 51 min             Past Medical History:  Diagnosis Date   Arthritis    Bronchitis    CHRONIC   Bulging lumbar disc    degenerative dics   Scoliosis    LIMITS LUNG FUNCTION   Spinal stenosis    Past Surgical History:  Procedure Laterality Date   CATARACT EXTRACTION W/PHACO Left 01/06/2018   Procedure: CATARACT EXTRACTION PHACO AND INTRAOCULAR LENS PLACEMENT (IOC);  Surgeon: Rosa College, MD;  Location: [facility];  Service: Ophthalmology;  Laterality: Left;  US  00:27.5 AP% 4.3 CDE 1.18 FLUID PACK LOT # [id number]   CATARACT EXTRACTION W/PHACO Right 02/10/2018   Procedure: CATARACT EXTRACTION PHACO AND INTRAOCULAR LENS PLACEMENT (IOC);  Surgeon: Rosa College, MD;  Location: [facility];  Service: Ophthalmology;  Laterality: Right;  US  00:30.5 AP% 5.0 CDE 1.51  Fluid pack lot # [id number]   FOOT X 2     HEEL SPUR EXCISION     TONSILLECTOMY     Patient Active Problem List   Diagnosis Date Noted   Elevated liver enzymes 06/14/2023   Osteoarthritis cervical spine 01/14/2022   Age-related osteoporosis without current pathological fracture 05/19/2021   Spondylosis of lumbar region without myelopathy or radiculopathy 05/19/2021   Degenerative disc disease, lumbar 05/19/2021   Scoliosis, unspecified 01/09/2015   Vitamin D  deficiency 01/08/2014   Mixed hyperlipidemia 01/08/2014    PCP: [name], MD  REFERRING PROVIDER: [name], MD  REFERRING DIAG: M41.9 (ICD-10-CM) - Scoliosis, unspecified   Rationale for Evaluation and Treatment: Rehabilitation  THERAPY DIAG:  Other idiopathic scoliosis,  lumbar region  Joint stiffness of spine  Muscle weakness (generalized)  ONSET DATE: chronic                                                                                                                                                                                        SUBJECTIVE:  SUBJECTIVE STATEMENT: Pt. Reports chronic low back pain everyday.  Pt. Uses MH and hot tub daily to manage symptoms.  Increase pain with prolonged standing.  Pt. Returned to [place]/ [place] after a 19 day trip (07/11/23).  Pt. Planning to return to pool at [employer] 2x/week, Yoga 1x/week, personal trainer 1x/week.  Pt. Is considering major back surgery with Dr. [name] (f/u 08/31/23) if bone density is improved.  Pt. Reports L LE/calf/foot N/T and weakness (no falls reported).  Pt. Wants to return to walking with trekking poles and no back symptoms.     PERTINENT HISTORY:  Pt. Well known to PT clinic.     PAIN:  Are you having pain? Yes: NPRS scale: 2/10 Pain location: low back Pain description: aching Aggravating factors: increase activity/ prolonged standing and walking Relieving factors: heat (hot tub use)   PRECAUTIONS: Back   [place] FLAGS: None      WEIGHT BEARING RESTRICTIONS: No   FALLS:  Has patient fallen in last 6 months? No   LIVING ENVIRONMENT: Lives with: lives alone Lives in: House/apartment Has following equipment at home: Single point cane   OCCUPATION: Retired   PLOF: Independent   PATIENT GOALS: Increase activity during the day with less back pain.     NEXT MD VISIT: 08/31/23   OBJECTIVE:  Note: Objective measures were completed at Evaluation unless otherwise noted.   DIAGNOSTIC FINDINGS:  See imaging   PATIENT SURVEYS:  FOTO initial 37/ goal 50   COGNITION: Overall cognitive status:  Within functional limits for tasks assessed                          SENSATION: L LE N/T   MUSCLE LENGTH: Hamstrings: Right 85 deg; Left 82 deg   POSTURE: rounded shoulders and forward head.  Moderate scoliosis (L to R).     PALPATION: (+) tenderness in R low thoracic/lumbar spine (hypomobile).     LUMBAR ROM:    AROM eval  Flexion 25% limited  Extension 75% limited  Right lateral flexion 50% limited  Left lateral flexion 50% limited  Right rotation 50% limited  Left rotation 50% limited   (Blank rows = not tested)   LOWER EXTREMITY ROM:      Active  Right eval Left eval  Hip flexion 98 deg. 100 deg.  Hip extension      Hip abduction      Hip adduction      Hip internal rotation 30 deg. 30 deg.  Hip external rotation      Knee flexion [facility] WFL  Knee extension [facility]  Ankle dorsiflexion      Ankle plantarflexion      Ankle inversion      Ankle eversion       (Blank rows = not tested)   LOWER EXTREMITY MMT:     MMT Right eval Left eval  Hip flexion 3/5 3/5  Hip extension      Hip abduction 3+ 3+  Hip adduction      Hip internal rotation 3+ 3+  Hip external rotation 3+ 3+  Knee flexion 4 4  Knee extension 4 4  Ankle dorsiflexion 4 4  Ankle plantarflexion      Ankle inversion      Ankle eversion       (Blank rows = not tested)   LUMBAR SPECIAL TESTS:  FABER test: Negative   FUNCTIONAL TESTS:  5 times sit to stand: TBD  GAIT: Distance walked: in clinic Assistive device utilized: None Level of assistance: Modified independence Comments: Pt. Ambulates with consistent recip. Gait pattern with no assistive devices.  Pt. Has rounded shoulder posture with limited arm swing.  Good heel strike/ clearance.    B LE muscle strength: 5/5 MMT.   Cervical AROM (seated): flexion (65 deg.)/ extension (20 deg.)/ R rotn. (31 deg.), L rotn. (32 deg.)/ R lat. Flexion (8 deg.)/ L lat. Flexion (13 deg.).     TREATMENT DATE:  09/28/23                  Subjective:   Pt. Reports 2/10 low back pain this morning.  Pt. Used hot tub today.  Discussed MD appt.     There.ex.:   Nustep L5 10 min. B UE/LE (use of MH to low back during Nustep).    Hallway walking with added alt. UE/LE touches  Resisted gait 2BTB (10x) all 4-planes in //-bars with mirror feedback.  Standing GTB: bicep curls/ ER "W"/ horizontal abduction 20x.    Manual tx.:  Supine LE/lumbar generalized stretches.   Seated STM to thoracic/lumbar paraspinals with use of Hypervolt.    Not today: Nautilus: 40# Pallof press/ 30# lat. Pull down 20x/ 30# lat. Pull downs 20x.    PATIENT EDUCATION:  Education details: Aquatic ex./ stretches/ HEP Person educated: Patient Education method: Medical illustrator Education comprehension: verbalized understanding and returned demonstration   HOME EXERCISE PROGRAM: TrA ex. Program (page #1-3)- pain limited with bridging.  Modified with bolster to assist with less pain.     ASSESSMENT:   CLINICAL IMPRESSION: Pt. Presents with marked spinal ROM limitations and strength deficits.  Pt. Works hard during PT tx. Session and progressing to more resisted there.ex with use of Nautilus.  No increase c/o back pain during tx. Session but several seated rest breaks.   Pt. Will benefit from skilled PT services to develop HEP to increase generalized strength to improve pain-free functional mobility.       OBJECTIVE IMPAIRMENTS: Abnormal gait, decreased activity tolerance, decreased endurance, decreased mobility, difficulty walking, decreased ROM, decreased strength, impaired flexibility, improper body mechanics, postural dysfunction, and pain.    ACTIVITY LIMITATIONS: carrying, lifting, bending, standing, squatting, transfers, and locomotion level   PARTICIPATION LIMITATIONS: cleaning, laundry, driving, shopping, and community activity   PERSONAL FACTORS: Fitness and Past/current experiences are also affecting patient's functional outcome.    REHAB  POTENTIAL: Good   CLINICAL DECISION MAKING: Evolving/moderate complexity   EVALUATION COMPLEXITY: Moderate     GOALS: Goals reviewed with patient? Yes  LONG TERM GOALS: Target date: 11/09/23   Pt. Will increase FOTO to 50 to improve pain-free mobility.   Baseline: 37 Goal status: On-going   2.  Pt. Will increase lumbar AROM 25% to improve functional mobility/ household tasks.   Baseline: See above Goal status: Not met   3.  Pt. Will report <5/10 back pain at worst with daily household tasks.   Baseline: 8/10 pain at worst Goal status: Not met   4.  Pt. Will increase B hip/LE muscle strength to 4+/5 MMT to improve standing/walking tasks.   Baseline: see above Goal status:  Goal met   PLAN:   PT FREQUENCY: 2x/week   PT DURATION: 6 weeks   PLANNED INTERVENTIONS: 97110-Therapeutic exercises, 97530- Therapeutic activity, 97112- Neuromuscular re-education, 97535- Self Care, 69629- Manual therapy, [phone]- Gait training, [phone]- Aquatic Therapy, Patient/Family education, Balance training, Stair training, Dry Needling, Joint mobilization, Cryotherapy, and Moist heat.  PLAN FOR NEXT SESSION: Core strengthening/ endurance training.    Lendell Quarry, PT, DPT # [phone] 10/09/2023, 6:32 PM

## 2023-10-12 ENCOUNTER — Ambulatory Visit: Payer: Medicare HMO | Admitting: Physical Therapy

## 2023-10-12 DIAGNOSIS — M2560 Stiffness of unspecified joint, not elsewhere classified: Secondary | ICD-10-CM | POA: Diagnosis not present

## 2023-10-12 DIAGNOSIS — M4126 Other idiopathic scoliosis, lumbar region: Secondary | ICD-10-CM | POA: Diagnosis not present

## 2023-10-12 DIAGNOSIS — M6281 Muscle weakness (generalized): Secondary | ICD-10-CM

## 2023-10-14 ENCOUNTER — Ambulatory Visit: Payer: Medicare HMO | Admitting: Internal Medicine

## 2023-10-14 ENCOUNTER — Encounter: Payer: Self-pay | Admitting: Internal Medicine

## 2023-10-14 ENCOUNTER — Ambulatory Visit (INDEPENDENT_AMBULATORY_CARE_PROVIDER_SITE_OTHER): Payer: Medicare HMO | Admitting: Internal Medicine

## 2023-10-14 VITALS — BP 122/78 | HR 61 | Ht 67.0 in | Wt 190.0 lb

## 2023-10-14 VITALS — BP 133/79 | HR 66 | Temp 97.5°F | Ht 67.0 in | Wt 190.4 lb

## 2023-10-14 DIAGNOSIS — E782 Mixed hyperlipidemia: Secondary | ICD-10-CM | POA: Diagnosis not present

## 2023-10-14 DIAGNOSIS — R739 Hyperglycemia, unspecified: Secondary | ICD-10-CM | POA: Diagnosis not present

## 2023-10-14 DIAGNOSIS — R0609 Other forms of dyspnea: Secondary | ICD-10-CM | POA: Diagnosis not present

## 2023-10-14 DIAGNOSIS — Z0000 Encounter for general adult medical examination without abnormal findings: Secondary | ICD-10-CM

## 2023-10-14 DIAGNOSIS — Z1231 Encounter for screening mammogram for malignant neoplasm of breast: Secondary | ICD-10-CM | POA: Diagnosis not present

## 2023-10-14 DIAGNOSIS — M47816 Spondylosis without myelopathy or radiculopathy, lumbar region: Secondary | ICD-10-CM

## 2023-10-14 DIAGNOSIS — M41125 Adolescent idiopathic scoliosis, thoracolumbar region: Secondary | ICD-10-CM | POA: Diagnosis not present

## 2023-10-14 DIAGNOSIS — R748 Abnormal levels of other serum enzymes: Secondary | ICD-10-CM

## 2023-10-14 DIAGNOSIS — Z01811 Encounter for preprocedural respiratory examination: Secondary | ICD-10-CM | POA: Diagnosis not present

## 2023-10-14 NOTE — Progress Notes (Signed)
 Date:  10/14/2023   Name:  COLLIER MONICA   DOB:  07/24/1951   MRN:  [id number]   Chief Complaint: Annual Exam QUANIKA SOLEM is a 73 y.o. female who presents today for her Complete Annual Exam. She feels well. She reports exercising physical therapy, deep water pool exercise, has a trainer. She reports she is sleeping fairly well. Breast complaints - none. Yes, breast exam..  Health Maintenance  Topic Date Due   COVID-19 Vaccine (8 - 2024-25 season) 06/29/2023   Mammogram  10/07/2023   DTaP/Tdap/Td vaccine (3 - Td or Tdap) 01/10/2024   Medicare Annual Wellness Visit  01/20/2024   Colon Cancer Screening  03/14/2030   Pneumonia Vaccine  Completed   Flu Shot  Completed   DEXA scan (bone density measurement)  Completed   Hepatitis C Screening  Completed   Zoster (Shingles) Vaccine  Completed   HPV Vaccine  Aged Out    Back Pain This is a chronic problem. The problem has been gradually worsening since onset. The pain is present in the lumbar spine. Associated symptoms include numbness. Pertinent negatives include no abdominal pain, chest pain, dysuria, headaches or weakness. She has tried NSAIDs, muscle relaxant and analgesics (ESI + dry needling) for the symptoms. The treatment provided mild relief.    Review of Systems  Constitutional:  Negative for fatigue and unexpected weight change.  HENT:  Negative for trouble swallowing.   Eyes:  Negative for visual disturbance.  Respiratory:  Negative for cough, chest tightness, shortness of breath and wheezing.   Cardiovascular:  Negative for chest pain, palpitations and leg swelling.  Gastrointestinal:  Negative for abdominal pain, constipation and diarrhea.  Genitourinary:  Negative for dysuria and frequency.  Musculoskeletal:  Positive for arthralgias and back pain.  Neurological:  Positive for numbness. Negative for dizziness, weakness, light-headedness and headaches.  Psychiatric/Behavioral:  Negative for dysphoric mood and sleep  disturbance. The patient is not nervous/anxious.      Lab Results  Component Value Date   NA 140 09/29/2022   K 4.6 09/29/2022   CO2 23 09/29/2022   GLUCOSE 105 (H) 09/29/2022   BUN 15 09/29/2022   CREATININE 0.76 09/29/2022   CALCIUM 9.8 09/29/2022   EGFR 84 09/29/2022   Lab Results  Component Value Date   CHOL 205 (H) 09/29/2022   HDL 46 09/29/2022   LDLCALC 134 (H) 09/29/2022   TRIG 138 09/29/2022   CHOLHDL 4.5 (H) 09/29/2022   Lab Results  Component Value Date   TSH 2.540 09/23/2021   No results found for: "HGBA1C" Lab Results  Component Value Date   WBC 3.3 (L) 09/29/2022   HGB 15.0 09/29/2022   HCT 44.4 09/29/2022   MCV 90 09/29/2022   PLT 267 09/29/2022   Lab Results  Component Value Date   ALT 47 (H) 04/30/2023   AST 32 04/30/2023   ALKPHOS 72 04/30/2023   BILITOT 0.5 04/30/2023   Lab Results  Component Value Date   VD25OH 56.9 09/29/2022     Patient Active Problem List   Diagnosis Date Noted   Elevated liver enzymes 06/14/2023   Osteoarthritis cervical spine 01/14/2022   Age-related osteoporosis without current pathological fracture 05/19/2021   Spondylosis of lumbar region without myelopathy or radiculopathy 05/19/2021   Degenerative disc disease, lumbar 05/19/2021   Scoliosis, unspecified 01/09/2015   Vitamin D deficiency 01/08/2014   Mixed hyperlipidemia 01/08/2014    Allergies  Allergen Reactions   Celecoxib Other (See Comments)  Elevated LFT's   Oxaprozin Other (See Comments)    Elevated LFT's   Terbinafine Other (See Comments)    Elevated LFT's    Past Surgical History:  Procedure Laterality Date   CATARACT EXTRACTION W/PHACO Left 01/06/2018   Procedure: CATARACT EXTRACTION PHACO AND INTRAOCULAR LENS PLACEMENT (IOC);  Surgeon: Nevada Crane, MD;  Location: [facility] ORS;  Service: Ophthalmology;  Laterality: Left;  [place] 00:27.5 AP% 4.3 CDE 1.18 FLUID PACK LOT # [id number]   CATARACT EXTRACTION W/PHACO Right 02/10/2018    Procedure: CATARACT EXTRACTION PHACO AND INTRAOCULAR LENS PLACEMENT (IOC);  Surgeon: Nevada Crane, MD;  Location: [facility] ORS;  Service: Ophthalmology;  Laterality: Right;  [place] 00:30.5 AP% 5.0 CDE 1.51  Fluid pack lot # [id number]   FOOT X 2     HEEL SPUR EXCISION     TONSILLECTOMY      Social History   Tobacco Use   Smoking status: Never   Smokeless tobacco: Never  Vaping Use   Vaping status: Never Used  Substance Use Topics   Alcohol use: Yes    Comment: OCCAS   Drug use: Never     Medication list has been reviewed and updated.  Current Meds  Medication Sig   Calcium-Vitamin D-Vitamin K 650-12.5-40 MG-MCG-MCG CHEW    cetirizine (ZYRTEC) 10 MG tablet Take 10 mg by mouth daily as needed for allergies.   gabapentin (NEURONTIN) 300 MG capsule Take 2 capsules by mouth 3 (three) times daily.   HYDROcodone-acetaminophen (NORCO/VICODIN) 5-325 MG tablet Take by mouth as needed. Pt taking 5 times per day   meloxicam (MOBIC) 15 MG tablet Take 15 mg by mouth as needed for pain.   Romosozumab-aqqg (EVENITY) 105 MG/1. SOSY injection Inject into the skin.   tiZANidine (ZANAFLEX) 2 MG tablet Take 2 mg by mouth daily as needed for muscle pain.   triamcinolone (NASACORT ALLERGY 24HR) 55 MCG/ACT AERO nasal inhaler Place 2 sprays into the nose 2 (two) times daily.   Vitamin D, Ergocalciferol, (DRISDOL) 1.25 MG (50000 UNIT) CAPS capsule Take 50,000 Units by mouth every 7 (seven) days.       10/14/2023    8:09 AM 07/19/2023    9:09 AM 09/29/2022    8:04 AM 09/09/2022    9:10 AM  GAD 7 : Generalized Anxiety Score  Nervous, Anxious, on Edge 1 0 0 0  Control/stop worrying 1 0 0 0  Worry too much - different things 0 0 0 0  Trouble relaxing 0 0 0 0  Restless 0 0 0 0  Easily annoyed or irritable 0 0 0 0  Afraid - awful might happen 1 0 0 0  Total GAD 7 Score 3 0 0 0  Anxiety Difficulty Somewhat difficult Not difficult at all Not difficult at all Not difficult at all       10/14/2023     8:09 AM 10/14/2023    8:08 AM 07/19/2023    9:09 AM  Depression screen PHQ 2/9  Decreased Interest 0 0 2  Down, Depressed, Hopeless 0 0 0  PHQ - 2 Score 0 0 2  Altered sleeping 1  2  Tired, decreased energy 2  2  Change in appetite 0  2  Feeling bad or failure about yourself  0  0  Trouble concentrating 0  2  Moving slowly or fidgety/restless 0  0  Suicidal thoughts 0  0  PHQ-9 Score 3  10  Difficult doing work/chores Somewhat difficult  Not difficult at all  BP Readings from Last 3 Encounters:  10/14/23 122/78  07/19/23 110/70  09/29/22 122/78    Physical Exam Vitals and nursing note reviewed.  Constitutional:      General: She is not in acute distress.    Appearance: She is well-developed.  HENT:     Head: Normocephalic and atraumatic.     Right Ear: Tympanic membrane and ear canal normal.     Left Ear: Tympanic membrane and ear canal normal.     Nose:     Right Sinus: No maxillary sinus tenderness.     Left Sinus: No maxillary sinus tenderness.  Eyes:     General: No scleral icterus.       Right eye: No discharge.        Left eye: No discharge.     Conjunctiva/sclera: Conjunctivae normal.  Neck:     Thyroid: No thyromegaly.     Vascular: No carotid bruit.  Cardiovascular:     Rate and Rhythm: Normal rate and regular rhythm.     Pulses: Normal pulses.     Heart sounds: Normal heart sounds.  Pulmonary:     Effort: Pulmonary effort is normal. No respiratory distress.     Breath sounds: No wheezing.  Chest:  Breasts:    Right: No mass, nipple discharge, skin change or tenderness.     Left: No mass, nipple discharge, skin change or tenderness.  Abdominal:     General: Bowel sounds are normal.     Palpations: Abdomen is soft.     Tenderness: There is no abdominal tenderness.  Musculoskeletal:     Cervical back: Normal range of motion. No erythema.     Right lower leg: No edema.     Left lower leg: No edema.     Comments: S-shaped scoliosis of entire  spine  Lymphadenopathy:     Cervical: No cervical adenopathy.  Skin:    General: Skin is warm and dry.     Findings: No rash.  Neurological:     Mental Status: She is alert and oriented to person, place, and time.     Cranial Nerves: No cranial nerve deficit.     Sensory: No sensory deficit.     Deep Tendon Reflexes:     Reflex Scores:      Patellar reflexes are 3+ on the right side and 2+ on the left side. Psychiatric:        Attention and Perception: Attention normal.        Mood and Affect: Mood normal.     Wt Readings from Last 3 Encounters:  10/14/23 190 lb (86.2 kg)  07/19/23 182 lb 9.6 oz (82.8 kg)  01/20/23 193 lb (87.5 kg)    BP 122/78   Pulse 61   Ht 5\' 7"  (1.702 m)   Wt 190 lb (86.2 kg)   SpO2 99%   BMI 29.76 kg/m   Assessment and Plan:  Problem List Items Addressed This Visit       Unprioritized   Mixed hyperlipidemia (Chronic)   Managed with diet only.  10 yr CAD risk below average. Lab Results  Component Value Date   LDLCALC 134 (H) 09/29/2022         Relevant Orders   Lipid panel   Spondylosis of lumbar region without myelopathy or radiculopathy   Followed by pain management.  On meloxicam, tizanidine and working with Systems analyst. She has met with the [facility] on 09/28/2023 and she is planning to move forward  with lumbar spine surgery.       Elevated liver enzymes   Relevant Orders   CBC with Differential/Platelet   Comprehensive metabolic panel   Other Visit Diagnoses       Annual physical exam    -  Primary   continue healthy diet and physical activity as able     Encounter for screening mammogram for breast cancer       Relevant Orders   MM 3D SCREENING MAMMOGRAM BILATERAL BREAST     Blood glucose elevated       Relevant Orders   Hemoglobin A1c       No follow-ups on file.    Reubin Milan, MD [facility] [place]

## 2023-10-14 NOTE — Assessment & Plan Note (Addendum)
 Managed with diet only.  10 yr CAD risk below average. Lab Results  Component Value Date   LDLCALC 134 (H) 09/29/2022

## 2023-10-14 NOTE — Patient Instructions (Addendum)
 ICD-10-CM   1. idiopathic scoliosis of thoracolumbar region  M41.125     2. DOE (dyspnea on exertion)  R06.09     3. Preop respiratory exam  Z01.811       Do full PFt Do HRCT supoine and prone Do ECHO  Followup  Video visit Dr Marchelle Gearing in next several weeks to 2 months to review results

## 2023-10-14 NOTE — Patient Instructions (Signed)
 Call [facility] to schedule your mammogram at [phone].

## 2023-10-14 NOTE — Progress Notes (Signed)
 OV 10/14/2023  Subjective:  Patient ID: Jennifer Morrow, female , DOB: [birth year] , age 73 y.o. , MRN: [id number] , ADDRESS: [address] [place] [address] PCP Reubin Milan, MD Patient Care Team: Reubin Milan, MD as PCP - General (Internal Medicine) Meeler, Jodelle Gross, FNP (Physical Medicine and Rehabilitation) Sherlon Handing, MD as Consulting Physician (Endocrinology) Jesusita Oka, MD (Dermatology) Merri Ray, MD as Referring Physician (Physical Medicine and Rehabilitation)  This Provider for this visit: Treatment Team:  Attending Provider: Kalman Shan, MD    10/14/2023 -   Chief Complaint  Patient presents with   Follow-up    RLD consult      HPI [name] 66 y.o. -is a close friend of my other patient Jennifer Morrow.  She is here more as a general inquiry about her restrictive chest condition and also as part of a preop evaluation.  She says she to the best of her knowledge at least since adolescent years has had thoracolumbar scoliosis that is significant.  She states over the course of years she has had worsening back pain.  She is pending increasing amount of times in bed because of the pain and having to rest.  Walking and standing are becoming more difficult.  She feels that she will end up in a wheelchair and at least has been told this by the spinal surgeons.  She has been evaluated at [facility] and in September 2025 the major 8-hour surgery is planned.  She states a few years ago she had 65% lung capacity on a spirometry done in the primary care office [results not available to me].  She has been told by the neurosurgeon that this will not be restored and the spinal surgery is mostly respiratory to for her back health.  She does have some mild shortness of breath with exertion but she is more limited by her back pain she does not have pedal edema.  To her best of knowledge she does not have any intrinsic airway or lung disease  such as asthma or COPD or fibrosis.  Or pulmonary embolism or lung cancer.  She is not known to have any heart disease or cancer.  No known prior imaging    Imaging.  Latest Reference Range & Units 09/23/21 09:16 09/29/22 08:30  EOS (ABSOLUTE) 0.0 - 0.4 x10E3/uL 0.0 0.0    PFT      No data to display             LAB RESULTS last 96 hours No results found.       has a past medical history of Arthritis, Bronchitis, Bulging lumbar disc, Scoliosis, and Spinal stenosis.   reports that she has never smoked. She has never used smokeless tobacco.  Past Surgical History:  Procedure Laterality Date   CATARACT EXTRACTION W/PHACO Left 01/06/2018   Procedure: CATARACT EXTRACTION PHACO AND INTRAOCULAR LENS PLACEMENT (IOC);  Surgeon: Nevada Crane, MD;  Location: [facility] ORS;  Service: Ophthalmology;  Laterality: Left;  [place] 00:27.5 AP% 4.3 CDE 1.18 FLUID PACK LOT # [id number]   CATARACT EXTRACTION W/PHACO Right 02/10/2018   Procedure: CATARACT EXTRACTION PHACO AND INTRAOCULAR LENS PLACEMENT (IOC);  Surgeon: Nevada Crane, MD;  Location: [facility] ORS;  Service: Ophthalmology;  Laterality: Right;  [place] 00:30.5 AP% 5.0 CDE 1.51  Fluid pack lot # [id number] H   FOOT X 2     HEEL SPUR EXCISION     TONSILLECTOMY  Allergies  Allergen Reactions   Celecoxib Other (See Comments)    Elevated LFT's   Oxaprozin Other (See Comments)    Elevated LFT's   Terbinafine Other (See Comments)    Elevated LFT's    Immunization History  Administered Date(s) Administered   Fluad Quad(high Dose 65+) 05/01/2019, 05/19/2021   Hepatitis A 11/01/2002, 05/01/2003   Hepatitis B, ADULT 08/17/1990   IPV 08/17/1990   Influenza, High Dose Seasonal PF 05/07/2017, 05/05/2018, 04/06/2020   Influenza, Seasonal, Injecte, Preservative Fre 05/09/2013   Influenza,inj,Quad PF,6+ Mos 05/20/2016   Influenza-Unspecified 06/08/2015, 04/21/2022, 05/04/2023   Meningococcal Conjugate 06/28/2018   PFIZER  Comirnaty(Gray Top)Covid-19 Tri-Sucrose Vaccine 04/06/2020   PFIZER(Purple Top)SARS-COV-2 Vaccination 09/09/2019, 10/01/2019, 05/02/2022, 05/04/2023   PNEUMOCOCCAL CONJUGATE-20 09/23/2021   Pfizer Covid-19 Vaccine Bivalent Booster 39yrs & up 05/19/2021, 01/08/2022   Pneumococcal Conjugate-13 07/26/2018   Pneumococcal Polysaccharide-23 07/21/2017   Rabies Immune Globulin 06/28/2018, 07/05/2018, 07/19/2018   Respiratory Syncytial Virus Vaccine,Recomb Aduvanted(Arexvy) 04/21/2022   Rubella 08/17/1981   Smallpox 08/17/1981   Td 08/17/1994   Tdap 01/09/2014   Typhoid Live 06/28/2018   Yellow Fever 06/03/2018   Zoster Recombinant(Shingrix) 11/02/2017, 03/08/2018   Zoster, Live 05/10/2013    Family History  Problem Relation Age of Onset   Heart disease Mother    Heart disease Father    Hypertension Sister    Diabetes Maternal Grandmother    Heart disease Maternal Grandfather    Diabetes Maternal Grandfather    Heart disease Paternal Grandmother    Breast cancer Neg Hx      Current Outpatient Medications:    Calcium-Vitamin D-Vitamin K 650-12.5-40 MG-MCG-MCG CHEW, , Disp: , Rfl:    cetirizine (ZYRTEC) 10 MG tablet, Take 10 mg by mouth daily as needed for allergies., Disp: , Rfl:    gabapentin (NEURONTIN) 300 MG capsule, Take 2 capsules by mouth 3 (three) times daily., Disp: , Rfl:    HYDROcodone-acetaminophen (NORCO/VICODIN) 5-325 MG tablet, Take by mouth as needed. Pt taking 5 times per day, Disp: , Rfl:    meloxicam (MOBIC) 15 MG tablet, Take 15 mg by mouth as needed for pain., Disp: , Rfl:    Romosozumab-aqqg (EVENITY) 105 MG/1. SOSY injection, Inject into the skin., Disp: , Rfl:    tiZANidine (ZANAFLEX) 2 MG tablet, Take 2 mg by mouth daily as needed for muscle pain., Disp: , Rfl:    triamcinolone (NASACORT ALLERGY 24HR) 55 MCG/ACT AERO nasal inhaler, Place 2 sprays into the nose 2 (two) times daily., Disp: 16.9 mL, Rfl: 0   Vitamin D, Ergocalciferol, (DRISDOL) 1.25 MG (50000  UNIT) CAPS capsule, Take 50,000 Units by mouth every 7 (seven) days., Disp: , Rfl:       Objective:   Vitals:   10/14/23 1309  BP: 133/79  Pulse: 66  Temp: (!) 97.5 F (36.4 C)  TempSrc: Oral  SpO2: 97%  Weight: 190 lb 6.4 oz (86.4 kg)  Height: 5\' 7"  (1.702 m)    Estimated body mass index is 29.82 kg/m as calculated from the following:   Height as of this encounter: 5\' 7"  (1.702 m).   Weight as of this encounter: 190 lb 6.4 oz (86.4 kg).  @WEIGHTCHANGE @  [employer]   10/14/23 1309  Weight: 190 lb 6.4 oz (86.4 kg)     Physical Exam   General: No distress. Looks well O2 at rest: no Cane present: no Sitting in wheel chair: no Frail: no Obese: no Neuro: Alert and Oriented x 3. GCS 15. Speech normal Psych:  Pleasant Resp:  Barrel Chest - no.  Wheeze - no, Crackles - no, No overt respiratory distress CVS: Normal heart sounds. Murmurs - no Ext: Stigmata of Connective Tissue Disease - no HEENT: Normal upper airway. PEERL +. No post nasal drip BACK -significant thoracolumbar scoliosis present.        Assessment:       ICD-10-CM   1. idiopathic scoliosis of thoracolumbar region  M41.125     2. DOE (dyspnea on exertion)  R06.09     3. Preop respiratory exam  Z01.811          Plan:     Patient Instructions     ICD-10-CM   1. idiopathic scoliosis of thoracolumbar region  M41.125     2. DOE (dyspnea on exertion)  R06.09     3. Preop respiratory exam  Z01.811       Do full PFt Do HRCT supoine and prone Do ECHO  Followup  Video visit Dr Marchelle Gearing in next several weeks to 2 months to review results   FOLLOWUP Return for next 4-8 weeks Face to Face OR Video Visit, with Dr Marchelle Gearing.    SIGNATURE    Dr. Kalman Shan, M.D., F.C.C.P,  Pulmonary and Critical Care Medicine Staff Physician, [facility] Center Director - Interstitial Lung Disease  Program  Pulmonary Fibrosis [facility] Network at [facility] [place], [place], [address]  Pager: [phone], If no answer or between  15:00h - 7:00h: call [phone] Telephone: [phone]  1:40 PM 10/14/2023

## 2023-10-14 NOTE — Assessment & Plan Note (Signed)
 Followed by pain management.  On meloxicam, tizanidine and working with Systems analyst. She has met with the [facility] on 09/28/2023 and she is planning to move forward with lumbar spine surgery.

## 2023-10-15 ENCOUNTER — Encounter: Payer: Self-pay | Admitting: Internal Medicine

## 2023-10-15 LAB — COMPREHENSIVE METABOLIC PANEL
ALT: 41 [IU]/L — ABNORMAL HIGH (ref 0–32)
AST: 26 [IU]/L (ref 0–40)
Albumin: 4.5 g/dL (ref 3.8–4.8)
Alkaline Phosphatase: 72 [IU]/L (ref 44–121)
BUN/Creatinine Ratio: 32 — ABNORMAL HIGH (ref 12–28)
BUN: 23 mg/dL (ref 8–27)
Bilirubin Total: 0.5 mg/dL (ref 0.0–1.2)
CO2: 24 mmol/L (ref 20–29)
Calcium: 9.9 mg/dL (ref 8.7–10.3)
Chloride: 105 mmol/L (ref 96–106)
Creatinine, Ser: 0.72 mg/dL (ref 0.57–1.00)
Globulin, Total: 2 g/dL (ref 1.5–4.5)
Glucose: 92 mg/dL (ref 70–99)
Potassium: 5 mmol/L (ref 3.5–5.2)
Sodium: 144 mmol/L (ref 134–144)
Total Protein: 6.5 g/dL (ref 6.0–8.5)
eGFR: 89 mL/min/{1.73_m2} (ref >59)

## 2023-10-15 LAB — CBC WITH DIFFERENTIAL/PLATELET
Basophils Absolute: 0 10*3/uL (ref 0.0–0.2)
Basos: 1 %
EOS (ABSOLUTE): 0 10*3/uL (ref 0.0–0.4)
Eos: 1 %
Hematocrit: 42.8 % (ref 34.0–46.6)
Hemoglobin: 14.3 g/dL (ref 11.1–15.9)
Immature Grans (Abs): 0 10*3/uL (ref 0.0–0.1)
Immature Granulocytes: 0 %
Lymphocytes Absolute: 2 10*3/uL (ref 0.7–3.1)
Lymphs: 50 %
MCH: 31 pg (ref 26.6–33.0)
MCHC: 33.4 g/dL (ref 31.5–35.7)
MCV: 93 fL (ref 79–97)
Monocytes Absolute: 0.3 10*3/uL (ref 0.1–0.9)
Monocytes: 8 %
Neutrophils Absolute: 1.6 10*3/uL (ref 1.4–7.0)
Neutrophils: 40 %
Platelets: 251 10*3/uL (ref 150–450)
RBC: 4.61 x10E6/uL (ref 3.77–5.28)
RDW: 12.4 % (ref 11.7–15.4)
WBC: 3.9 10*3/uL (ref 3.4–10.8)

## 2023-10-15 LAB — HEMOGLOBIN A1C
Est. average glucose Bld gHb Est-mCnc: 120 mg/dL
Hgb A1c MFr Bld: 5.8 % — ABNORMAL HIGH (ref 4.8–5.6)

## 2023-10-15 LAB — LIPID PANEL
Chol/HDL Ratio: 3.7 {ratio} (ref 0.0–4.4)
Cholesterol, Total: 219 mg/dL — ABNORMAL HIGH (ref 100–199)
HDL: 59 mg/dL (ref >39)
LDL Chol Calc (NIH): 143 mg/dL — ABNORMAL HIGH (ref 0–99)
Triglycerides: 97 mg/dL (ref 0–149)
VLDL Cholesterol Cal: 17 mg/dL (ref 5–40)

## 2023-10-15 NOTE — Therapy (Addendum)
 OUTPATIENT PHYSICAL THERAPY THORACOLUMBAR TREATMENT/ DISCHARGE  Patient Name: Jennifer Morrow MRN: [id number] DOB:Dec 22, 1950, 73 y.o., female Today's Date: 10/12/2023  END OF SESSION:  PT End of Session - 10/15/23 1905     Visit Number 19    Number of Visits 29    Date for PT Re-Evaluation 11/09/23    PT Start Time 0858    PT Stop Time 0946    PT Time Calculation (min) 48 min             Past Medical History:  Diagnosis Date   Arthritis    Bronchitis    CHRONIC   Bulging lumbar disc    degenerative dics   Scoliosis    LIMITS LUNG FUNCTION   Spinal stenosis    Past Surgical History:  Procedure Laterality Date   CATARACT EXTRACTION W/PHACO Left 01/06/2018   Procedure: CATARACT EXTRACTION PHACO AND INTRAOCULAR LENS PLACEMENT (IOC);  Surgeon: Rosa College, MD;  Location: [facility] ORS;  Service: Ophthalmology;  Laterality: Left;  US  00:27.5 AP% 4.3 CDE 1.18 FLUID PACK LOT # [id number]   CATARACT EXTRACTION W/PHACO Right 02/10/2018   Procedure: CATARACT EXTRACTION PHACO AND INTRAOCULAR LENS PLACEMENT (IOC);  Surgeon: Rosa College, MD;  Location: [facility] ORS;  Service: Ophthalmology;  Laterality: Right;  US  00:30.5 AP% 5.0 CDE 1.51  Fluid pack lot # [id number]   FOOT X 2     HEEL SPUR EXCISION     TONSILLECTOMY     Patient Active Problem List   Diagnosis Date Noted   Elevated liver enzymes 06/14/2023   Osteoarthritis cervical spine 01/14/2022   Age-related osteoporosis without current pathological fracture 05/19/2021   Spondylosis of lumbar region without myelopathy or radiculopathy 05/19/2021   Degenerative disc disease, lumbar 05/19/2021   Scoliosis, unspecified 01/09/2015   Vitamin D  deficiency 01/08/2014   Mixed hyperlipidemia 01/08/2014    PCP: [name], MD  REFERRING PROVIDER: [name], MD  REFERRING DIAG: M41.9 (ICD-10-CM) - Scoliosis, unspecified   Rationale for Evaluation and Treatment: Rehabilitation  THERAPY DIAG:  Other idiopathic  scoliosis, lumbar region  Joint stiffness of spine  Muscle weakness (generalized)  ONSET DATE: chronic     PCP: Rococs, Jessee Mormon, MD  REFERRING PROVIDER: [name], MD  REFERRING DIAG: M41.9 (ICD-10-CM) - Scoliosis, unspecified   Rationale for Evaluation and Treatment: Rehabilitation  THERAPY DIAG:  Other idiopathic scoliosis, lumbar region  Joint stiffness of spine  Muscle weakness (generalized)  ONSET DATE: chronic  SUBJECTIVE:                                                                                                                                                                                            SUBJECTIVE STATEMENT: Pt. Reports chronic low back pain everyday.  Pt. Uses MH and hot tub daily to manage symptoms.  Increase pain with prolonged standing.  Pt. Returned to [place]/ [place] after a 19 day trip (07/11/23).  Pt. Planning to return to pool at [employer] 2x/week, Yoga 1x/week, personal trainer 1x/week.  Pt. Is considering major back surgery with Dr. [name] (f/u 08/31/23) if bone density is improved.  Pt. Reports L LE/calf/foot N/T and weakness (no falls reported).  Pt. Wants to return to walking with trekking poles and no back symptoms.     PERTINENT HISTORY:  Pt. Well known to PT clinic.     PAIN:  Are you having pain? Yes: NPRS scale: 2/10 Pain location: low back Pain description: aching Aggravating factors: increase activity/ prolonged standing and walking Relieving factors: heat (hot tub use)   PRECAUTIONS: Back   RED FLAGS: None      WEIGHT BEARING RESTRICTIONS: No   FALLS:  Has patient fallen in last 6 months? No   LIVING ENVIRONMENT: Lives with: lives alone Lives in: House/apartment Has following equipment at home: Single point cane   OCCUPATION: Retired    PLOF: Independent   PATIENT GOALS: Increase activity during the day with less back pain.     NEXT MD VISIT: 08/31/23   OBJECTIVE:  Note: Objective measures were completed at Evaluation unless otherwise noted.   DIAGNOSTIC FINDINGS:  See imaging   PATIENT SURVEYS:  FOTO initial 37/ goal 50   COGNITION: Overall cognitive status: Within functional limits for tasks assessed                          SENSATION: L LE N/T   MUSCLE LENGTH: Hamstrings: Right 85 deg; Left 82 deg   POSTURE: rounded shoulders and forward head.  Moderate scoliosis (L to R).     PALPATION: (+) tenderness in R low thoracic/lumbar spine (hypomobile).     LUMBAR ROM:    AROM eval  Flexion 25% limited  Extension 75% limited  Right lateral flexion 50% limited  Left lateral flexion 50% limited  Right rotation 50% limited  Left rotation 50% limited   (Blank rows = not tested)   LOWER EXTREMITY ROM:      Active  Right eval Left eval  Hip flexion 98 deg. 100 deg.  Hip extension      Hip  abduction      Hip adduction      Hip internal rotation 30 deg. 30 deg.  Hip external rotation      Knee flexion [facility] WFL  Knee extension [facility]  Ankle dorsiflexion      Ankle plantarflexion      Ankle inversion      Ankle eversion       (Blank rows = not tested)   LOWER EXTREMITY MMT:     MMT Right eval Left eval  Hip flexion 3/5 3/5  Hip extension      Hip abduction 3+ 3+  Hip adduction      Hip internal rotation 3+ 3+  Hip external rotation 3+ 3+  Knee flexion 4 4  Knee extension 4 4  Ankle dorsiflexion 4 4  Ankle plantarflexion      Ankle inversion      Ankle eversion       (Blank rows = not tested)   LUMBAR SPECIAL TESTS:  FABER test: Negative   FUNCTIONAL TESTS:  5 times sit to stand: TBD   GAIT: Distance walked: in clinic Assistive device utilized: None Level of assistance: Modified independence Comments: Pt. Ambulates with consistent recip. Gait pattern with no assistive  devices.  Pt. Has rounded shoulder posture with limited arm swing.  Good heel strike/ clearance.    B LE muscle strength: 5/5 MMT.   Cervical AROM (seated): flexion (65 deg.)/ extension (20 deg.)/ R rotn. (31 deg.), L rotn. (32 deg.)/ R lat. Flexion (8 deg.)/ L lat. Flexion (13 deg.).     TREATMENT DATE:  10/12/23                  Subjective:  Pt. States injection is on 10/15/23.  Pt. Had to take Hydrocodone last night secondary to increase c/o pain.  Pt did not sleep well last night.  Pt. Used hot tub this morning.     There.ex.:   Nustep L5 10 min. B UE/LE (use of MH to low back during Nustep).    Resisted gait 2BTB (10x) all 4-planes in //-bars with mirror feedback.  2# UE ex. In standing sh. Flexion/ bicep curls/ chest press.    Walking alt. UE/LE touches in hallway.   2nd step lunges/ hamstring stretches at stairs.    Seated blue ball ex. At mirror: marching/ wt. Shifting/ LAQ/ Alt. UE and LE 20x each.    Discussed HEP.    Manual tx.:  Supine LE/lumbar generalized stretches.   Seated STM to thoracic/lumbar paraspinals with use of Hypervolt.      PATIENT EDUCATION:  Education details: Aquatic ex./ stretches/ HEP Person educated: Patient Education method: Medical illustrator Education comprehension: verbalized understanding and returned demonstration   HOME EXERCISE PROGRAM: TrA ex. Program (page #1-3)- pain limited with bridging.  Modified with bolster to assist with less pain.     ASSESSMENT:   CLINICAL IMPRESSION: No increase c/o back pain during tx. Session but several seated rest breaks.  Discharge from skilled PT services at this time with focus on HEP.  Pt. Returns to MD for f/u to discuss surgery options.     OBJECTIVE IMPAIRMENTS: Abnormal gait, decreased activity tolerance, decreased endurance, decreased mobility, difficulty walking, decreased ROM, decreased strength, impaired flexibility, improper body mechanics, postural dysfunction, and pain.     ACTIVITY LIMITATIONS: carrying, lifting, bending, standing, squatting, transfers, and locomotion level   PARTICIPATION LIMITATIONS: cleaning, laundry, driving, shopping, and community activity   PERSONAL FACTORS: Fitness and Past/current  experiences are also affecting patient's functional outcome.    REHAB POTENTIAL: Good   CLINICAL DECISION MAKING: Evolving/moderate complexity   EVALUATION COMPLEXITY: Moderate     GOALS: Goals reviewed with patient? Yes  LONG TERM GOALS: Target date: 11/09/23   Pt. Will increase FOTO to 50 to improve pain-free mobility.   Baseline: 37 Goal status: Not able to reassess   2.  Pt. Will increase lumbar AROM 25% to improve functional mobility/ household tasks.   Baseline: See above Goal status: Partially met   3.  Pt. Will report <5/10 back pain at worst with daily household tasks.   Baseline: 8/10 pain at worst Goal status: Not met   4.  Pt. Will increase B hip/LE muscle strength to 4+/5 MMT to improve standing/walking tasks.   Baseline: see above Goal status:  Goal met   PLAN:   PT FREQUENCY: 2x/week   PT DURATION: 6 weeks   PLANNED INTERVENTIONS: 97110-Therapeutic exercises, 97530- Therapeutic activity, 97112- Neuromuscular re-education, 97535- Self Care, 16109- Manual therapy, [phone]- Gait training, [phone]- Aquatic Therapy, Patient/Family education, Balance training, Stair training, Dry Needling, Joint mobilization, Cryotherapy, and Moist heat.   PLAN FOR NEXT SESSION:  Discharge visit.  Pt. Instructed to contact PT if any regression in symptoms/ questions.                        Lendell Quarry, PT, DPT # [phone] 10/15/2023, 7:08 PM

## 2023-10-22 ENCOUNTER — Telehealth: Payer: Self-pay | Admitting: Internal Medicine

## 2023-10-22 ENCOUNTER — Encounter: Payer: Self-pay | Admitting: Internal Medicine

## 2023-10-22 NOTE — Telephone Encounter (Signed)
 PCC'S please advise. Thanks

## 2023-10-22 NOTE — Addendum Note (Signed)
 Addended by: Kalman Shan on: 10/22/2023 12:40 PM   Modules accepted: Orders

## 2023-10-22 NOTE — Telephone Encounter (Signed)
 Patient calling because she has not gotten a call to schedule HRCT supoine and prone and ECHO. Please place order in order to schedule these.

## 2023-10-22 NOTE — Telephone Encounter (Signed)
 I am pretty sure I ordered these at the time of the visit but when I went back into the note I did not see it.  I apologize.  I have ordered it now

## 2023-10-22 NOTE — Telephone Encounter (Signed)
 [facility] is calling and they need prior auth for the patient's CT by Monday(10/25/23)morning at 10:00am or they will have to cancel the CT. [phone]  ext:[id number]

## 2023-10-25 DIAGNOSIS — M810 Age-related osteoporosis without current pathological fracture: Secondary | ICD-10-CM | POA: Diagnosis not present

## 2023-10-25 DIAGNOSIS — R7303 Prediabetes: Secondary | ICD-10-CM | POA: Diagnosis not present

## 2023-10-25 DIAGNOSIS — E559 Vitamin D deficiency, unspecified: Secondary | ICD-10-CM | POA: Diagnosis not present

## 2023-10-26 ENCOUNTER — Ambulatory Visit
Admission: RE | Admit: 2023-10-26 | Discharge: 2023-10-26 | Disposition: A | Discharge status: Home / Self Care | Source: Ambulatory Visit | Attending: Internal Medicine | Admitting: Internal Medicine

## 2023-10-26 DIAGNOSIS — I700 Atherosclerosis of aorta: Secondary | ICD-10-CM | POA: Diagnosis not present

## 2023-10-26 DIAGNOSIS — R0609 Other forms of dyspnea: Secondary | ICD-10-CM

## 2023-10-26 DIAGNOSIS — M41125 Adolescent idiopathic scoliosis, thoracolumbar region: Secondary | ICD-10-CM

## 2023-10-26 DIAGNOSIS — R911 Solitary pulmonary nodule: Secondary | ICD-10-CM | POA: Diagnosis not present

## 2023-10-26 DIAGNOSIS — Z01811 Encounter for preprocedural respiratory examination: Secondary | ICD-10-CM

## 2023-10-27 ENCOUNTER — Ambulatory Visit
Admission: RE | Admit: 2023-10-27 | Discharge: 2023-10-27 | Disposition: A | Discharge status: Home / Self Care | Source: Ambulatory Visit | Attending: Internal Medicine | Admitting: Internal Medicine

## 2023-10-27 DIAGNOSIS — Z1231 Encounter for screening mammogram for malignant neoplasm of breast: Secondary | ICD-10-CM | POA: Diagnosis not present

## 2023-10-29 ENCOUNTER — Encounter: Payer: Self-pay | Admitting: Internal Medicine

## 2023-10-29 ENCOUNTER — Other Ambulatory Visit: Payer: Self-pay | Admitting: Internal Medicine

## 2023-10-29 DIAGNOSIS — M47816 Spondylosis without myelopathy or radiculopathy, lumbar region: Secondary | ICD-10-CM

## 2023-10-29 DIAGNOSIS — M4186 Other forms of scoliosis, lumbar region: Secondary | ICD-10-CM

## 2023-10-29 NOTE — Progress Notes (Unsigned)
 Date:  10/29/2023   Name:  Jennifer Morrow   DOB:  Nov 06, 1950   MRN:  [id number]   Chief Complaint: No chief complaint on file.  HPI  Review of Systems   Lab Results  Component Value Date   NA 144 10/14/2023   K 5.0 10/14/2023   CO2 24 10/14/2023   GLUCOSE 92 10/14/2023   BUN 23 10/14/2023   CREATININE 0.72 10/14/2023   CALCIUM 9.9 10/14/2023   EGFR 89 10/14/2023   Lab Results  Component Value Date   CHOL 219 (H) 10/14/2023   HDL 59 10/14/2023   LDLCALC 143 (H) 10/14/2023   TRIG 97 10/14/2023   CHOLHDL 3.7 10/14/2023   Lab Results  Component Value Date   TSH 2.540 09/23/2021   Lab Results  Component Value Date   HGBA1C 5.8 (H) 10/14/2023   Lab Results  Component Value Date   WBC 3.9 10/14/2023   HGB 14.3 10/14/2023   HCT 42.8 10/14/2023   MCV 93 10/14/2023   PLT 251 10/14/2023   Lab Results  Component Value Date   ALT 41 (H) 10/14/2023   AST 26 10/14/2023   ALKPHOS 72 10/14/2023   BILITOT 0.5 10/14/2023   Lab Results  Component Value Date   VD25OH 56.9 09/29/2022     Patient Active Problem List   Diagnosis Date Noted   Elevated liver enzymes 06/14/2023   Osteoarthritis cervical spine 01/14/2022   Age-related osteoporosis without current pathological fracture 05/19/2021   Spondylosis of lumbar region without myelopathy or radiculopathy 05/19/2021   Degenerative disc disease, lumbar 05/19/2021   Scoliosis, unspecified 01/09/2015   Vitamin D deficiency 01/08/2014   Mixed hyperlipidemia 01/08/2014    Allergies  Allergen Reactions   Celecoxib Other (See Comments)    Elevated LFT's   Oxaprozin Other (See Comments)    Elevated LFT's   Terbinafine Other (See Comments)    Elevated LFT's    Past Surgical History:  Procedure Laterality Date   CATARACT EXTRACTION W/PHACO Left 01/06/2018   Procedure: CATARACT EXTRACTION PHACO AND INTRAOCULAR LENS PLACEMENT (IOC);  Surgeon: Nevada Crane, MD;  Location: [facility] ORS;  Service: Ophthalmology;   Laterality: Left;  [place] 00:27.5 AP% 4.3 CDE 1.18 FLUID PACK LOT # [id number]   CATARACT EXTRACTION W/PHACO Right 02/10/2018   Procedure: CATARACT EXTRACTION PHACO AND INTRAOCULAR LENS PLACEMENT (IOC);  Surgeon: Nevada Crane, MD;  Location: [facility] ORS;  Service: Ophthalmology;  Laterality: Right;  [place] 00:30.5 AP% 5.0 CDE 1.51  Fluid pack lot # [id number] H   FOOT X 2     HEEL SPUR EXCISION     TONSILLECTOMY      Social History   Tobacco Use   Smoking status: Never   Smokeless tobacco: Never  Vaping Use   Vaping status: Never Used  Substance Use Topics   Alcohol use: Yes    Comment: OCCAS   Drug use: Never     Medication list has been reviewed and updated.  No outpatient medications have been marked as taking for the 10/29/23 encounter (Orders Only) with Reubin Milan, MD.       10/14/2023    8:09 AM 07/19/2023    9:09 AM 09/29/2022    8:04 AM 09/09/2022    9:10 AM  GAD 7 : Generalized Anxiety Score  Nervous, Anxious, on Edge 1 0 0 0  Control/stop worrying 1 0 0 0  Worry too much - different things 0 0 0 0  Trouble relaxing 0 0  0 0  Restless 0 0 0 0  Easily annoyed or irritable 0 0 0 0  Afraid - awful might happen 1 0 0 0  Total GAD 7 Score 3 0 0 0  Anxiety Difficulty Somewhat difficult Not difficult at all Not difficult at all Not difficult at all       10/14/2023    8:09 AM 10/14/2023    8:08 AM 07/19/2023    9:09 AM  Depression screen PHQ 2/9  Decreased Interest 0 0 2  Down, Depressed, Hopeless 0 0 0  PHQ - 2 Score 0 0 2  Altered sleeping 1  2  Tired, decreased energy 2  2  Change in appetite 0  2  Feeling bad or failure about yourself  0  0  Trouble concentrating 0  2  Moving slowly or fidgety/restless 0  0  Suicidal thoughts 0  0  PHQ-9 Score 3  10  Difficult doing work/chores Somewhat difficult  Not difficult at all    BP Readings from Last 3 Encounters:  10/14/23 133/79  10/14/23 122/78  07/19/23 110/70    Physical Exam  Wt Readings from Last  3 Encounters:  10/14/23 190 lb 6.4 oz (86.4 kg)  10/14/23 190 lb (86.2 kg)  07/19/23 182 lb 9.6 oz (82.8 kg)    There were no vitals taken for this visit.  Assessment and Plan:  Problem List Items Addressed This Visit   None   No follow-ups on file.    Reubin Milan, MD [facility] [place]

## 2023-11-01 NOTE — Telephone Encounter (Signed)
 Patient has had the ct already on 3/11 and the Echo is scheduled 11/15/23 pt aware

## 2023-11-07 ENCOUNTER — Encounter: Payer: Self-pay | Admitting: Internal Medicine

## 2023-11-15 ENCOUNTER — Ambulatory Visit (HOSPITAL_COMMUNITY): Attending: Cardiology

## 2023-11-15 DIAGNOSIS — Z01811 Encounter for preprocedural respiratory examination: Secondary | ICD-10-CM | POA: Insufficient documentation

## 2023-11-15 DIAGNOSIS — R0609 Other forms of dyspnea: Secondary | ICD-10-CM | POA: Diagnosis not present

## 2023-11-15 DIAGNOSIS — M41125 Adolescent idiopathic scoliosis, thoracolumbar region: Secondary | ICD-10-CM | POA: Insufficient documentation

## 2023-11-15 DIAGNOSIS — R0602 Shortness of breath: Secondary | ICD-10-CM | POA: Diagnosis not present

## 2023-11-15 LAB — ECHOCARDIOGRAM COMPLETE
Area-P 1/2: 3.12 cm2
S' Lateral: 2.6 cm

## 2023-12-01 DIAGNOSIS — L821 Other seborrheic keratosis: Secondary | ICD-10-CM | POA: Diagnosis not present

## 2023-12-01 DIAGNOSIS — Z86018 Personal history of other benign neoplasm: Secondary | ICD-10-CM | POA: Diagnosis not present

## 2023-12-01 DIAGNOSIS — Z872 Personal history of diseases of the skin and subcutaneous tissue: Secondary | ICD-10-CM | POA: Diagnosis not present

## 2023-12-01 DIAGNOSIS — L578 Other skin changes due to chronic exposure to nonionizing radiation: Secondary | ICD-10-CM | POA: Diagnosis not present

## 2023-12-08 DIAGNOSIS — E663 Overweight: Secondary | ICD-10-CM | POA: Diagnosis not present

## 2023-12-08 DIAGNOSIS — M4120 Other idiopathic scoliosis, site unspecified: Secondary | ICD-10-CM | POA: Diagnosis not present

## 2023-12-08 DIAGNOSIS — Z1330 Encounter for screening examination for mental health and behavioral disorders, unspecified: Secondary | ICD-10-CM | POA: Diagnosis not present

## 2023-12-09 ENCOUNTER — Ambulatory Visit: Attending: Spine Surgery | Admitting: Physical Therapy

## 2023-12-09 DIAGNOSIS — M6281 Muscle weakness (generalized): Secondary | ICD-10-CM | POA: Diagnosis present

## 2023-12-09 DIAGNOSIS — M4126 Other idiopathic scoliosis, lumbar region: Secondary | ICD-10-CM | POA: Insufficient documentation

## 2023-12-09 DIAGNOSIS — M2560 Stiffness of unspecified joint, not elsewhere classified: Secondary | ICD-10-CM | POA: Diagnosis present

## 2023-12-15 NOTE — Therapy (Addendum)
 OUTPATIENT PHYSICAL THERAPY THORACOLUMBAR EVALUATION  Patient Name: Jennifer Morrow MRN: [id number] DOB:08-29-1950, 73 y.o., female Today's Date: 12/09/23  END OF SESSION:  PT End of Session - 12/15/23 2002     Visit Number 1    Number of Visits 12    Date for PT Re-Evaluation 03/02/24    PT Start Time 0735    PT Stop Time 0819    PT Time Calculation (min) 44 min            Past Medical History:  Diagnosis Date   Arthritis    Bronchitis    CHRONIC   Bulging lumbar disc    degenerative dics   Scoliosis    LIMITS LUNG FUNCTION   Spinal stenosis    Past Surgical History:  Procedure Laterality Date   CATARACT EXTRACTION W/PHACO Left 01/06/2018   Procedure: CATARACT EXTRACTION PHACO AND INTRAOCULAR LENS PLACEMENT (IOC);  Surgeon: Rosa College, MD;  Location: [facility] OR[facility];  Service: Ophthalmology;  Laterality: Left;  US  00:27.5 AP% 4.3 CDE 1.18 FLUID PACK LOT # [id number]   CATARACT EXTRACTION W/PHACO Right 02/10/2018   Procedure: CATARACT EXTRACTION PHACO AND INTRAOCULAR LENS PLACEMENT (IOC);  Surgeon: Rosa College, MD;  Location: [facility] OR[facility];  Service: Ophthalmology;  Laterality: Right;  US  00:30.5 AP% 5.0 CDE 1.51  Fluid pack lot # [id number] H   FOOT X 2     HEEL SPUR EXCISION     TONSILLECTOMY     Patient Active Problem List   Diagnosis Date Noted   Elevated liver enzymes 06/14/2023   Osteoarthritis cervical spine 01/14/2022   Age-related osteoporosis without current pathological fracture 05/19/2021   Spondylosis of lumbar region without myelopathy or radiculopathy 05/19/2021   Degenerative disc disease, lumbar 05/19/2021   Scoliosis, unspecified 01/09/2015   Vitamin D  deficiency 01/08/2014   Mixed hyperlipidemia 01/08/2014   PCP: Janna Melter, MD  REFERRING PROVIDER: [name], MD  REFERRING DIAG: M41.9 (ICD-10-CM) - Scoliosis, unspecified   Rationale for Evaluation and Treatment: Rehabilitation  THERAPY DIAG:  Other idiopathic scoliosis, lumbar  region  Joint stiffness of spine  Muscle weakness (generalized)  ONSET DATE: chronic          SUBJECTIVE:                                                                                                                                                                                           SUBJECTIVE STATEMENT: Pt. Reports chronic low back pain everyday.  Pt. Uses MH and hot tub daily to manage symptoms.  Increase pain with prolonged standing/ driving and activity during the day.  Pt. Known well to  PT clinic and discharged from PT earlier this year.  Pt. Is scheduled for major back surgery with Dr. [name] for spinal fusion after summer.   Pt. Reports L LE/calf/foot N/T and weakness (no falls reported).  Pt. Wants to return to walking with trekking poles and no back symptoms.  Pt. Returned to exercise with Terri Fester (personal trainer) 5 weeks ago and 1st session was difficult/ resulted in several days of soreness. Pt. Scheduled for Terri Fester ex. On Tuesday.  Pt. Having a pulmonary functional test next week for lung clearance for back surgery.   PERTINENT HISTORY:  Pt. Well known to PT clinic.    PAIN:  Are you having pain? Yes: NPRS scale: 3/10 Pain location: low back Pain description: aching/ constant numbness in L LE/ calf/ bottom of foot.  R hip to R foot pain will result in "giving out" with walking.  Aggravating factors: increase activity/ prolonged standing and walking Relieving factors: heat (hot tub use)  PRECAUTIONS: Back/ scoliosis  RED FLAGS: None   WEIGHT BEARING RESTRICTIONS: No  FALLS:  Has patient fallen in last 6 months? No  LIVING ENVIRONMENT: Lives with: lives alone Lives in: House/apartment Has following equipment at home: Single point cane  OCCUPATION: Retired  PLOF: Independent  PATIENT GOALS: Increase activity during the day with less back pain.    NEXT MD VISIT: PRN  OBJECTIVE:  Note: Objective measures were completed at Evaluation unless otherwise  noted.  DIAGNOSTIC FINDINGS:  See imaging  PATIENT SURVEYS:  MODI: 64% self-perceived extreme disability.    COGNITION: Overall cognitive status: Within functional limits for tasks assessed     SENSATION: L LE to foot N/T  MUSCLE LENGTH: Hamstrings: Right 26 deg; Left 70 deg  POSTURE: rounded shoulders and forward head.  Moderate scoliosis (L to R).    PALPATION: (+) tenderness in R low thoracic/lumbar spine (hypomobile).    LUMBAR ROM:   AROM eval  Flexion 60 deg.  Extension 12 deg.  Right lateral flexion 50% limited  Left lateral flexion 50% limited  Right rotation 50% limited  Left rotation 50% limited   (Blank rows = not tested)  LOWER EXTREMITY ROM:     Active  Right eval Left eval  Hip flexion 115 deg. 112 deg.  Hip extension    Hip abduction [facility]  Hip adduction    Hip internal rotation 18 deg. 28 deg.  Hip external rotation    Knee flexion 136 deg. 132 deg.   Knee extension 0 deg. 0 deg.   Ankle dorsiflexion    Ankle plantarflexion    Ankle inversion    Ankle eversion     (Blank rows = not tested)  LOWER EXTREMITY MMT:    MMT Right eval Left eval  Hip flexion 4 4  Hip extension    Hip abduction 4 4  Hip adduction    Hip internal rotation 3+ 3+  Hip external rotation 3+ 3+  Knee flexion 4+ 4+  Knee extension 4 5  Ankle dorsiflexion 4 4  Ankle plantarflexion    Ankle inversion    Ankle eversion     (Blank rows = not tested)  LUMBAR SPECIAL TESTS:  FABER test: Negative  FUNCTIONAL TESTS:  5 times sit to stand: TBD  GAIT: Distance walked: in clinic Assistive device utilized: None Level of assistance: Modified independence Comments: Pt. Ambulates with consistent recip. Gait pattern with no assistive devices.  Pt. Has rounded shoulder posture with limited arm swing.  Good heel strike/ clearance.  TREATMENT DATE:  12/09/23               See evaluation/ HEP                                                                                                                  PATIENT EDUCATION:  Education details: Aquatic ex./ stretches/ HEP Person educated: Patient Education method: Medical illustrator Education comprehension: verbalized understanding and returned demonstration  HOME EXERCISE PROGRAM: Discussed ex. With Terri Fester -static floor, cervical rotn., piriformis stretch, hip rotn., pelvic tilts, hip rotn. With bent knee, floor press (lying hip adductor stretch), hip abduction, shoulder rotn. In supine, overhead extension (bent legs).     ASSESSMENT:  CLINICAL IMPRESSION: Patient is a pleasant 73 y.o. female who was seen today for physical therapy evaluation and treatment for scoliosis/ chronic back pain.  Pt. Is scheduled for major back surgery to address chronic back pain.   Pt. Presents with marked spinal ROM limitations and strength deficits.  Pt. Will benefit from skilled PT services to develop progressive HEP to increase generalized strength, esp. Core/lumbar musculature to improve pain-free functional mobility.      OBJECTIVE IMPAIRMENTS: Abnormal gait, decreased activity tolerance, decreased endurance, decreased mobility, difficulty walking, decreased ROM, decreased strength, impaired flexibility, improper body mechanics, postural dysfunction, and pain.   ACTIVITY LIMITATIONS: carrying, lifting, bending, standing, squatting, transfers, and locomotion level  PARTICIPATION LIMITATIONS: cleaning, laundry, driving, shopping, and community activity  PERSONAL FACTORS: Fitness and Past/current experiences are also affecting patient's functional outcome.   REHAB POTENTIAL: Good  CLINICAL DECISION MAKING: Evolving/moderate complexity  EVALUATION COMPLEXITY: Moderate   GOALS: Goals reviewed with patient? Yes  SHORT TERM GOALS: Target date: 01/06/24  Pt. Independent with HEP to progress LE muscle strength 1/2 muscle grade to improve pain-free mobility.   Baseline: Goal status: INITIAL  LONG TERM  GOALS: Target date: 03/02/24  Pt. Will decrease MODI to <40 to improve pain-free mobility.   Baseline: initial 64% self-perceived extreme disability.   Goal status: INITIAL  2.  Pt. Will increase lumbar AROM 25% to improve functional mobility/ household tasks.   Baseline: See above Goal status: INITIAL  3.  Pt. Will report <5/10 back pain at worst with daily household tasks.   Baseline: 8/10 pain at worst Goal status: INITIAL  4.  Pt. Will increase B LE/core muscle strength to 4+/5 MMT to improve standing/walking tasks in preparation for spinal surgery.   Baseline: see above Goal status: INITIAL  PLAN:  PT FREQUENCY: 1x/week  PT DURATION: 12 weeks  PLANNED INTERVENTIONS: 97110-Therapeutic exercises, 97530- Therapeutic activity, [id number]- Neuromuscular re-education, 97535- Self Care, 08657- Manual therapy, [phone]- Gait training, [phone]- Aquatic Therapy, Patient/Family education, Balance training, Stair training, Dry Needling, Joint mobilization, Cryotherapy, and Moist heat.  PLAN FOR NEXT SESSION: Discuss trip to [place]/ exercise program with Terri Fester.  Lendell Quarry, PT, DPT # [phone] 12/15/2023, 8:04 PM

## 2023-12-22 ENCOUNTER — Ambulatory Visit: Admitting: Physical Therapy

## 2023-12-27 ENCOUNTER — Ambulatory Visit: Admitting: Internal Medicine

## 2023-12-27 DIAGNOSIS — Z01811 Encounter for preprocedural respiratory examination: Secondary | ICD-10-CM

## 2023-12-27 DIAGNOSIS — M41125 Adolescent idiopathic scoliosis, thoracolumbar region: Secondary | ICD-10-CM | POA: Diagnosis not present

## 2023-12-27 DIAGNOSIS — R0609 Other forms of dyspnea: Secondary | ICD-10-CM

## 2023-12-27 LAB — PULMONARY FUNCTION TEST
DL/VA % pred: 129 %
DL/VA: 5.26 ml/min/mmHg/L
DLCO unc % pred: 92 %
DLCO unc: 19.58 ml/min/mmHg
FEF 25-75 Post: 0.86 L/s
FEF 25-75 Pre: 1.08 L/s
FEF2575-%Change-Post: -20 %
FEF2575-%Pred-Post: 42 %
FEF2575-%Pred-Pre: 53 %
FEV1-%Change-Post: -7 %
FEV1-%Pred-Post: 56 %
FEV1-%Pred-Pre: 60 %
FEV1-Post: 1.42 L
FEV1-Pre: 1.54 L
FEV1FVC-%Change-Post: -8 %
FEV1FVC-%Pred-Pre: 95 %
FEV6-%Change-Post: 0 %
FEV6-%Pred-Post: 67 %
FEV6-%Pred-Pre: 66 %
FEV6-Post: 2.15 L
FEV6-Pre: 2.13 L
FEV6FVC-%Change-Post: 0 %
FEV6FVC-%Pred-Post: 104 %
FEV6FVC-%Pred-Pre: 104 %
FVC-%Change-Post: 0 %
FVC-%Pred-Post: 64 %
FVC-%Pred-Pre: 64 %
FVC-Post: 2.15 L
FVC-Pre: 2.14 L
Post FEV1/FVC ratio: 66 %
Post FEV6/FVC ratio: 100 %
Pre FEV1/FVC ratio: 72 %
Pre FEV6/FVC Ratio: 100 %
RV % pred: 114 %
RV: 2.7 L
TLC % pred: 90 %
TLC: 4.97 L

## 2023-12-27 NOTE — Addendum Note (Signed)
 Addended by: Lendell Quarry on: 12/27/2023 10:54 AM   Modules accepted: Orders

## 2023-12-27 NOTE — Progress Notes (Signed)
 Full PFT completed today ? ?

## 2023-12-27 NOTE — Addendum Note (Signed)
 Addended by: Lendell Quarry on: 12/27/2023 01:15 PM   Modules accepted: Orders

## 2023-12-27 NOTE — Patient Instructions (Signed)
 Full PFT completed today ? ?

## 2023-12-28 NOTE — Therapy (Signed)
 OUTPATIENT PHYSICAL THERAPY THORACOLUMBAR TREATMENT  Patient Name: Jennifer Morrow MRN: [id number] DOB:September 10, 1950, 73 y.o., female Today's Date: 12/09/23  END OF SESSION:   Past Medical History:  Diagnosis Date   Arthritis    Bronchitis    CHRONIC   Bulging lumbar disc    degenerative dics   Scoliosis    LIMITS LUNG FUNCTION   Spinal stenosis    Past Surgical History:  Procedure Laterality Date   CATARACT EXTRACTION W/PHACO Left 01/06/2018   Procedure: CATARACT EXTRACTION PHACO AND INTRAOCULAR LENS PLACEMENT (IOC);  Surgeon: Rosa College, MD;  Location: [facility];  Service: Ophthalmology;  Laterality: Left;  US  00:27.5 AP% 4.3 CDE 1.18 FLUID PACK LOT # [id number]   CATARACT EXTRACTION W/PHACO Right 02/10/2018   Procedure: CATARACT EXTRACTION PHACO AND INTRAOCULAR LENS PLACEMENT (IOC);  Surgeon: Rosa College, MD;  Location: [facility];  Service: Ophthalmology;  Laterality: Right;  US  00:30.5 AP% 5.0 CDE 1.51  Fluid pack lot # [id number]   FOOT X 2     HEEL SPUR EXCISION     TONSILLECTOMY     Patient Active Problem List   Diagnosis Date Noted   Elevated liver enzymes 06/14/2023   Osteoarthritis cervical spine 01/14/2022   Age-related osteoporosis without current pathological fracture 05/19/2021   Spondylosis of lumbar region without myelopathy or radiculopathy 05/19/2021   Degenerative disc disease, lumbar 05/19/2021   Scoliosis, unspecified 01/09/2015   Vitamin D  deficiency 01/08/2014   Mixed hyperlipidemia 01/08/2014   PCP: Janna Melter, MD  REFERRING PROVIDER: [name], MD  REFERRING DIAG: M41.9 (ICD-10-CM) - Scoliosis, unspecified   Rationale for Evaluation and Treatment: Rehabilitation  THERAPY DIAG:  Other idiopathic scoliosis, lumbar region  Joint stiffness of spine  Muscle weakness (generalized)  ONSET DATE: chronic          SUBJECTIVE:                                                                                                                                                                                            SUBJECTIVE STATEMENT: Pt. Reports chronic low back pain everyday.  Pt. Uses MH and hot tub daily to manage symptoms.  Increase pain with prolonged standing/ driving and activity during the day.  Pt. Known well to PT clinic and discharged from PT earlier this year.  Pt. Is scheduled for major back surgery with Dr. [name] for spinal fusion after summer.   Pt. Reports L LE/calf/foot N/T and weakness (no falls reported).  Pt. Wants to return to walking with trekking poles and no back symptoms.  Pt. Returned to exercise with Terri Fester (personal trainer) 5 weeks  ago and 1st session was difficult/ resulted in several days of soreness. Pt. Scheduled for Terri Fester ex. On Tuesday.  Pt. Having a pulmonary functional test next week for lung clearance for back surgery.   PERTINENT HISTORY:  Pt. Well known to PT clinic.    PAIN:  Are you having pain? Yes: NPRS scale: 3/10 Pain location: low back Pain description: aching/ constant numbness in L LE/ calf/ bottom of foot.  R hip to R foot pain will result in "giving out" with walking.  Aggravating factors: increase activity/ prolonged standing and walking Relieving factors: heat (hot tub use)  PRECAUTIONS: Back/ scoliosis  RED FLAGS: None   WEIGHT BEARING RESTRICTIONS: No  FALLS:  Has patient fallen in last 6 months? No  LIVING ENVIRONMENT: Lives with: lives alone Lives in: House/apartment Has following equipment at home: Single point cane  OCCUPATION: Retired  PLOF: Independent  PATIENT GOALS: Increase activity during the day with less back pain.    NEXT MD VISIT: PRN  OBJECTIVE:  Note: Objective measures were completed at Evaluation unless otherwise noted.  DIAGNOSTIC FINDINGS:  See imaging  PATIENT SURVEYS:  MODI: 64% self-perceived extreme disability.    COGNITION: Overall cognitive status: Within functional limits for tasks assessed     SENSATION: L LE to  foot N/T  MUSCLE LENGTH: Hamstrings: Right 26 deg; Left 70 deg  POSTURE: rounded shoulders and forward head.  Moderate scoliosis (L to R).    PALPATION: (+) tenderness in R low thoracic/lumbar spine (hypomobile).    LUMBAR ROM:   AROM eval  Flexion 60 deg.  Extension 12 deg.  Right lateral flexion 50% limited  Left lateral flexion 50% limited  Right rotation 50% limited  Left rotation 50% limited   (Blank rows = not tested)  LOWER EXTREMITY ROM:     Active  Right eval Left eval  Hip flexion 115 deg. 112 deg.  Hip extension    Hip abduction [facility]  Hip adduction    Hip internal rotation 18 deg. 28 deg.  Hip external rotation    Knee flexion 136 deg. 132 deg.   Knee extension 0 deg. 0 deg.   Ankle dorsiflexion    Ankle plantarflexion    Ankle inversion    Ankle eversion     (Blank rows = not tested)  LOWER EXTREMITY MMT:    MMT Right eval Left eval  Hip flexion 4 4  Hip extension    Hip abduction 4 4  Hip adduction    Hip internal rotation 3+ 3+  Hip external rotation 3+ 3+  Knee flexion 4+ 4+  Knee extension 4 5  Ankle dorsiflexion 4 4  Ankle plantarflexion    Ankle inversion    Ankle eversion     (Blank rows = not tested)  LUMBAR SPECIAL TESTS:  FABER test: Negative  FUNCTIONAL TESTS:  5 times sit to stand: TBD  GAIT: Distance walked: in clinic Assistive device utilized: None Level of assistance: Modified independence Comments: Pt. Ambulates with consistent recip. Gait pattern with no assistive devices.  Pt. Has rounded shoulder posture with limited arm swing.  Good heel strike/ clearance.    TREATMENT DATE:  12/28/23              *** Subjective:   PATIENT EDUCATION:  Education details: Aquatic ex./ stretches/ HEP Person educated: Patient Education method: Medical illustrator Education comprehension: verbalized understanding and returned demonstration  HOME EXERCISE PROGRAM: Discussed ex. With Terri Fester -static floor, cervical  rotn., piriformis  stretch, hip rotn., pelvic tilts, hip rotn. With bent knee, floor press (lying hip adductor stretch), hip abduction, shoulder rotn. In supine, overhead extension (bent legs).     ASSESSMENT:  CLINICAL IMPRESSION: ***  Patient is a pleasant 73 y.o. female who was seen today for physical therapy evaluation and treatment for scoliosis/ chronic back pain.  Pt. Is scheduled for major back surgery to address chronic back pain.   Pt. Presents with marked spinal ROM limitations and strength deficits.  Pt. Will benefit from skilled PT services to develop progressive HEP to increase generalized strength, esp. Core/lumbar musculature to improve pain-free functional mobility.      OBJECTIVE IMPAIRMENTS: Abnormal gait, decreased activity tolerance, decreased endurance, decreased mobility, difficulty walking, decreased ROM, decreased strength, impaired flexibility, improper body mechanics, postural dysfunction, and pain.   ACTIVITY LIMITATIONS: carrying, lifting, bending, standing, squatting, transfers, and locomotion level  PARTICIPATION LIMITATIONS: cleaning, laundry, driving, shopping, and community activity  PERSONAL FACTORS: Fitness and Past/current experiences are also affecting patient's functional outcome.   REHAB POTENTIAL: Good  CLINICAL DECISION MAKING: Evolving/moderate complexity  EVALUATION COMPLEXITY: Moderate   GOALS: Goals reviewed with patient? Yes  SHORT TERM GOALS: Target date: 01/06/24  Pt. Independent with HEP to progress LE muscle strength 1/2 muscle grade to improve pain-free mobility.   Baseline: Goal status: INITIAL  LONG TERM GOALS: Target date: 03/02/24  Pt. Will decrease MODI to <40 to improve pain-free mobility.   Baseline: initial 64% self-perceived extreme disability.   Goal status: INITIAL  2.  Pt. Will increase lumbar AROM 25% to improve functional mobility/ household tasks.   Baseline: See above Goal status: INITIAL  3.  Pt. Will report  <5/10 back pain at worst with daily household tasks.   Baseline: 8/10 pain at worst Goal status: INITIAL  4.  Pt. Will increase B LE/core muscle strength to 4+/5 MMT to improve standing/walking tasks in preparation for spinal surgery.   Baseline: see above Goal status: INITIAL  PLAN:  PT FREQUENCY: 1x/week  PT DURATION: 12 weeks  PLANNED INTERVENTIONS: 97110-Therapeutic exercises, 97530- Therapeutic activity, [id number]- Neuromuscular re-education, 97535- Self Care, 16109- Manual therapy, [phone]- Gait training, [phone]- Aquatic Therapy, Patient/Family education, Balance training, Stair training, Dry Needling, Joint mobilization, Cryotherapy, and Moist heat.  PLAN FOR NEXT SESSION: Discuss trip to [place]/ exercise program with Terri Fester.  Janine Melbourne, PT, DPT Physical Therapist - [facility]  12/28/2023, 3:35 PM

## 2023-12-29 ENCOUNTER — Ambulatory Visit: Attending: Spine Surgery

## 2023-12-29 ENCOUNTER — Encounter: Payer: Self-pay | Admitting: Physical Therapy

## 2023-12-29 DIAGNOSIS — M2560 Stiffness of unspecified joint, not elsewhere classified: Secondary | ICD-10-CM | POA: Insufficient documentation

## 2023-12-29 DIAGNOSIS — M6281 Muscle weakness (generalized): Secondary | ICD-10-CM | POA: Insufficient documentation

## 2023-12-29 DIAGNOSIS — M4126 Other idiopathic scoliosis, lumbar region: Secondary | ICD-10-CM | POA: Insufficient documentation

## 2023-12-30 ENCOUNTER — Encounter: Admitting: Physical Therapy

## 2024-01-06 ENCOUNTER — Encounter: Admitting: Physical Therapy

## 2024-01-13 ENCOUNTER — Ambulatory Visit: Admitting: Physical Therapy

## 2024-01-13 ENCOUNTER — Encounter: Payer: Self-pay | Admitting: Physical Therapy

## 2024-01-13 DIAGNOSIS — M2560 Stiffness of unspecified joint, not elsewhere classified: Secondary | ICD-10-CM

## 2024-01-13 DIAGNOSIS — M6281 Muscle weakness (generalized): Secondary | ICD-10-CM | POA: Diagnosis not present

## 2024-01-13 DIAGNOSIS — M4126 Other idiopathic scoliosis, lumbar region: Secondary | ICD-10-CM

## 2024-01-13 NOTE — Therapy (Signed)
 OUTPATIENT PHYSICAL THERAPY THORACOLUMBAR TREATMENT  Patient Name: Jennifer Morrow MRN: [id number] DOB:1950/10/06, 73 y.o., female Today's Date: 01/13/2024  END OF SESSION:  PT End of Session - 01/13/24 0944     Visit Number 3    Number of Visits 12    Date for PT Re-Evaluation 03/02/24    PT Start Time 0944    PT Stop Time 1033    PT Time Calculation (min) 49 min    Activity Tolerance Patient tolerated treatment well    Behavior During Therapy [facility] for tasks assessed/performed             Past Medical History:  Diagnosis Date   Arthritis    Bronchitis    CHRONIC   Bulging lumbar disc    degenerative dics   Scoliosis    LIMITS LUNG FUNCTION   Spinal stenosis    Past Surgical History:  Procedure Laterality Date   CATARACT EXTRACTION W/PHACO Left 01/06/2018   Procedure: CATARACT EXTRACTION PHACO AND INTRAOCULAR LENS PLACEMENT (IOC);  Surgeon: Rosa College, MD;  Location: [facility] OR[facility];  Service: Ophthalmology;  Laterality: Left;  US  00:27.5 AP% 4.3 CDE 1.18 FLUID PACK LOT # [id number]   CATARACT EXTRACTION W/PHACO Right 02/10/2018   Procedure: CATARACT EXTRACTION PHACO AND INTRAOCULAR LENS PLACEMENT (IOC);  Surgeon: Rosa College, MD;  Location: [facility] OR[facility];  Service: Ophthalmology;  Laterality: Right;  US  00:30.5 AP% 5.0 CDE 1.51  Fluid pack lot # [id number] H   FOOT X 2     HEEL SPUR EXCISION     TONSILLECTOMY     Patient Active Problem List   Diagnosis Date Noted   Elevated liver enzymes 06/14/2023   Osteoarthritis cervical spine 01/14/2022   Age-related osteoporosis without current pathological fracture 05/19/2021   Spondylosis of lumbar region without myelopathy or radiculopathy 05/19/2021   Degenerative disc disease, lumbar 05/19/2021   Scoliosis, unspecified 01/09/2015   Vitamin D  deficiency 01/08/2014   Mixed hyperlipidemia 01/08/2014   PCP: Janna Melter, MD  REFERRING PROVIDER: [name], MD  REFERRING DIAG: M41.9 (ICD-10-CM) - Scoliosis,  unspecified   Rationale for Evaluation and Treatment: Rehabilitation  THERAPY DIAG:  Other idiopathic scoliosis, lumbar region  Joint stiffness of spine  Muscle weakness (generalized)  ONSET DATE: chronic          SUBJECTIVE:                                                                                                                                                                                           SUBJECTIVE STATEMENT: Pt. Reports chronic low back pain everyday.  Pt. Uses MH and hot tub daily  to manage symptoms.  Increase pain with prolonged standing/ driving and activity during the day.  Pt. Known well to PT clinic and discharged from PT earlier this year.  Pt. Is scheduled for major back surgery with Dr. [name] for spinal fusion after summer.   Pt. Reports L LE/calf/foot N/T and weakness (no falls reported).  Pt. Wants to return to walking with trekking poles and no back symptoms.  Pt. Returned to exercise with Jennifer Morrow (personal trainer) 5 weeks ago and 1st session was difficult/ resulted in several days of soreness. Pt. Scheduled for Jennifer Morrow ex. On Tuesday.  Pt. Having a pulmonary functional test next week for lung clearance for back surgery.   PERTINENT HISTORY:  Pt. Well known to PT clinic.    PAIN:  Are you having pain? Yes: NPRS scale: 3/10 Pain location: low back Pain description: aching/ constant numbness in L LE/ calf/ bottom of foot.  R hip to R foot pain will result in "giving out" with walking.  Aggravating factors: increase activity/ prolonged standing and walking Relieving factors: heat (hot tub use)  PRECAUTIONS: Back/ scoliosis  RED FLAGS: None   WEIGHT BEARING RESTRICTIONS: No  FALLS:  Has patient fallen in last 6 months? No  LIVING ENVIRONMENT: Lives with: lives alone Lives in: House/apartment Has following equipment at home: Single point cane  OCCUPATION: Retired  PLOF: Independent  PATIENT GOALS: Increase activity during the day with less back  pain.    NEXT MD VISIT: PRN  OBJECTIVE:  Note: Objective measures were completed at Evaluation unless otherwise noted.  DIAGNOSTIC FINDINGS:  See imaging  PATIENT SURVEYS:  MODI: 64% self-perceived extreme disability.    COGNITION: Overall cognitive status: Within functional limits for tasks assessed     SENSATION: L LE to foot N/T  MUSCLE LENGTH: Hamstrings: Right 26 deg; Left 70 deg  POSTURE: rounded shoulders and forward head.  Moderate scoliosis (L to R).    PALPATION: (+) tenderness in R low thoracic/lumbar spine (hypomobile).    LUMBAR ROM:   AROM eval  Flexion 60 deg.  Extension 12 deg.  Right lateral flexion 50% limited  Left lateral flexion 50% limited  Right rotation 50% limited  Left rotation 50% limited   (Blank rows = not tested)  LOWER EXTREMITY ROM:     Active  Right eval Left eval  Hip flexion 115 deg. 112 deg.  Hip extension    Hip abduction [facility]  Hip adduction    Hip internal rotation 18 deg. 28 deg.  Hip external rotation    Knee flexion 136 deg. 132 deg.   Knee extension 0 deg. 0 deg.   Ankle dorsiflexion    Ankle plantarflexion    Ankle inversion    Ankle eversion     (Blank rows = not tested)  LOWER EXTREMITY MMT:    MMT Right eval Left eval  Hip flexion 4 4  Hip extension    Hip abduction 4 4  Hip adduction    Hip internal rotation 3+ 3+  Hip external rotation 3+ 3+  Knee flexion 4+ 4+  Knee extension 4 5  Ankle dorsiflexion 4 4  Ankle plantarflexion    Ankle inversion    Ankle eversion     (Blank rows = not tested)  LUMBAR SPECIAL TESTS:  FABER test: Negative  FUNCTIONAL TESTS:  5 times sit to stand: TBD  GAIT: Distance walked: in clinic Assistive device utilized: None Level of assistance: Modified independence Comments: Pt. Ambulates with consistent recip. Gait  pattern with no assistive devices.  Pt. Has rounded shoulder posture with limited arm swing.  Good heel strike/ clearance.    TREATMENT DATE:   01/13/24               Subjective: Pt. Had some dental work and still recovering.  Pt. Has not been sleeping well secondary to dental work.  Pt. Has been taking a muscle relaxer to improve sleep.  Pt. States "the back has been good" because pt. Has been less active secondary to dental issues.  Pt. C/o 1/10 back pain currently.    Manual:  Supine LE/lumbar stretches (generalized)- 11 min.  Focus on FABER/ hip IR/ proximal hamstring length.    Seated STM to mid-thoracic/lumbar paraspinals with use of Hypervolt   TE:  Nustep level 4 for 10 minutes B UE/LE to improve strength and cardiorespiratory endurance  Supine bridges (bolster with no increase pain)- limited with regular bridging/ marching/ hip abduction/ lower trunk rotation/ SAQ/ dead bug x 20 each   Reviewed HEP   PATIENT EDUCATION:  Education details: Aquatic ex./ stretches/ HEP Person educated: Patient Education method: Medical illustrator Education comprehension: verbalized understanding and returned demonstration  HOME EXERCISE PROGRAM: Discussed ex. With Jennifer Morrow -static floor, cervical rotn., piriformis stretch, hip rotn., pelvic tilts, hip rotn. With bent knee, floor press (lying hip adductor stretch), hip abduction, shoulder rotn. In supine, overhead extension (bent legs).     ASSESSMENT:  CLINICAL IMPRESSION:   Patient arrives to treatment session with reports of being a good day regarding pain. Session focused on core activation and BLE strengthening along with stretches and STM to lumbar paraspinals. Tolerated session well with no increase in pain. Patient will benefit from skilled PT services to develop progressive HEP to increase generalized strength, esp. Core/lumbar musculature to improve pain-free functional mobility.      OBJECTIVE IMPAIRMENTS: Abnormal gait, decreased activity tolerance, decreased endurance, decreased mobility, difficulty walking, decreased ROM, decreased strength, impaired flexibility,  improper body mechanics, postural dysfunction, and pain.   ACTIVITY LIMITATIONS: carrying, lifting, bending, standing, squatting, transfers, and locomotion level  PARTICIPATION LIMITATIONS: cleaning, laundry, driving, shopping, and community activity  PERSONAL FACTORS: Fitness and Past/current experiences are also affecting patient's functional outcome.   REHAB POTENTIAL: Good  CLINICAL DECISION MAKING: Evolving/moderate complexity  EVALUATION COMPLEXITY: Moderate   GOALS: Goals reviewed with patient? Yes  SHORT TERM GOALS: Target date: 01/06/24  Pt. Independent with HEP to progress LE muscle strength 1/2 muscle grade to improve pain-free mobility.   Baseline: Goal status: Partially met  LONG TERM GOALS: Target date: 03/02/24  Pt. Will decrease MODI to <40 to improve pain-free mobility.   Baseline: initial 64% self-perceived extreme disability.   Goal status: INITIAL  2.  Pt. Will increase lumbar AROM 25% to improve functional mobility/ household tasks.   Baseline: See above Goal status: INITIAL  3.  Pt. Will report <5/10 back pain at worst with daily household tasks.   Baseline: 8/10 pain at worst Goal status: INITIAL  4.  Pt. Will increase B LE/core muscle strength to 4+/5 MMT to improve standing/walking tasks in preparation for spinal surgery.   Baseline: see above Goal status: INITIAL  PLAN:  PT FREQUENCY: 1x/week  PT DURATION: 12 weeks  PLANNED INTERVENTIONS: 97110-Therapeutic exercises, 97530- Therapeutic activity, [id number]- Neuromuscular re-education, 97535- Self Care, [id number]- Manual therapy, [phone]- Gait training, [phone]- Aquatic Therapy, Patient/Family education, Balance training, Stair training, Dry Needling, Joint mobilization, Cryotherapy, and Moist heat.  PLAN FOR NEXT SESSION: Progress HEP/ discuss Jennifer Morrow  Jennifer Morrow, PT, DPT # [id number] Physical Therapist - [facility]  01/13/2024, 12:54 PM

## 2024-01-18 ENCOUNTER — Ambulatory Visit: Attending: Spine Surgery | Admitting: Physical Therapy

## 2024-01-18 ENCOUNTER — Encounter: Payer: Self-pay | Admitting: Physical Therapy

## 2024-01-18 DIAGNOSIS — M2560 Stiffness of unspecified joint, not elsewhere classified: Secondary | ICD-10-CM | POA: Insufficient documentation

## 2024-01-18 DIAGNOSIS — M4126 Other idiopathic scoliosis, lumbar region: Secondary | ICD-10-CM | POA: Diagnosis not present

## 2024-01-18 DIAGNOSIS — M6281 Muscle weakness (generalized): Secondary | ICD-10-CM | POA: Insufficient documentation

## 2024-01-18 NOTE — Therapy (Signed)
 OUTPATIENT PHYSICAL THERAPY THORACOLUMBAR TREATMENT  Patient Name: Jennifer Morrow MRN: [id number] DOB:1951/05/04, 73 y.o., female Today's Date: 01/18/2024  END OF SESSION:  PT End of Session - 01/18/24 0944     Visit Number 4    Number of Visits 12    Date for PT Re-Evaluation 03/02/24    PT Start Time 0944    PT Stop Time 1033    PT Time Calculation (min) 49 min    Activity Tolerance Patient tolerated treatment well    Behavior During Therapy [facility] for tasks assessed/performed            Past Medical History:  Diagnosis Date   Arthritis    Bronchitis    CHRONIC   Bulging lumbar disc    degenerative dics   Scoliosis    LIMITS LUNG FUNCTION   Spinal stenosis    Past Surgical History:  Procedure Laterality Date   CATARACT EXTRACTION W/PHACO Left 01/06/2018   Procedure: CATARACT EXTRACTION PHACO AND INTRAOCULAR LENS PLACEMENT (IOC);  Surgeon: Rosa College, MD;  Location: [facility] ORS;  Service: Ophthalmology;  Laterality: Left;  US  00:27.5 AP% 4.3 CDE 1.18 FLUID PACK LOT # [id number]   CATARACT EXTRACTION W/PHACO Right 02/10/2018   Procedure: CATARACT EXTRACTION PHACO AND INTRAOCULAR LENS PLACEMENT (IOC);  Surgeon: Rosa College, MD;  Location: [facility] ORS;  Service: Ophthalmology;  Laterality: Right;  US  00:30.5 AP% 5.0 CDE 1.51  Fluid pack lot # [id number]   FOOT X 2     HEEL SPUR EXCISION     TONSILLECTOMY     Patient Active Problem List   Diagnosis Date Noted   Elevated liver enzymes 06/14/2023   Osteoarthritis cervical spine 01/14/2022   Age-related osteoporosis without current pathological fracture 05/19/2021   Spondylosis of lumbar region without myelopathy or radiculopathy 05/19/2021   Degenerative disc disease, lumbar 05/19/2021   Scoliosis, unspecified 01/09/2015   Vitamin D  deficiency 01/08/2014   Mixed hyperlipidemia 01/08/2014   PCP: Janna Melter, MD  REFERRING PROVIDER: [name], MD  REFERRING DIAG: M41.9 (ICD-10-CM) - Scoliosis,  unspecified   Rationale for Evaluation and Treatment: Rehabilitation  THERAPY DIAG:  Other idiopathic scoliosis, lumbar region  Joint stiffness of spine  Muscle weakness (generalized)  ONSET DATE: chronic          SUBJECTIVE:                                                                                                                                                                                           SUBJECTIVE STATEMENT: Pt. Reports chronic low back pain everyday.  Pt. Uses MH and hot tub daily to  manage symptoms.  Increase pain with prolonged standing/ driving and activity during the day.  Pt. Known well to PT clinic and discharged from PT earlier this year.  Pt. Is scheduled for major back surgery with Dr. [name] for spinal fusion after summer.   Pt. Reports L LE/calf/foot N/T and weakness (no falls reported).  Pt. Wants to return to walking with trekking poles and no back symptoms.  Pt. Returned to exercise with Terri Fester (personal trainer) 5 weeks ago and 1st session was difficult/ resulted in several days of soreness. Pt. Scheduled for Terri Fester ex. On Tuesday.  Pt. Having a pulmonary functional test next week for lung clearance for back surgery.   PERTINENT HISTORY:  Pt. Well known to PT clinic.    PAIN:  Are you having pain? Yes: NPRS scale: 3/10 Pain location: low back Pain description: aching/ constant numbness in L LE/ calf/ bottom of foot.  R hip to R foot pain will result in "giving out" with walking.  Aggravating factors: increase activity/ prolonged standing and walking Relieving factors: heat (hot tub use)  PRECAUTIONS: Back/ scoliosis  RED FLAGS: None   WEIGHT BEARING RESTRICTIONS: No  FALLS:  Has patient fallen in last 6 months? No  LIVING ENVIRONMENT: Lives with: lives alone Lives in: House/apartment Has following equipment at home: Single point cane  OCCUPATION: Retired  PLOF: Independent  PATIENT GOALS: Increase activity during the day with less back  pain.    NEXT MD VISIT: PRN  OBJECTIVE:  Note: Objective measures were completed at Evaluation unless otherwise noted.  DIAGNOSTIC FINDINGS:  See imaging  PATIENT SURVEYS:  MODI: 64% self-perceived extreme disability.    COGNITION: Overall cognitive status: Within functional limits for tasks assessed     SENSATION: L LE to foot N/T  MUSCLE LENGTH: Hamstrings: Right 26 deg; Left 70 deg  POSTURE: rounded shoulders and forward head.  Moderate scoliosis (L to R).    PALPATION: (+) tenderness in R low thoracic/lumbar spine (hypomobile).    LUMBAR ROM:   AROM eval  Flexion 60 deg.  Extension 12 deg.  Right lateral flexion 50% limited  Left lateral flexion 50% limited  Right rotation 50% limited  Left rotation 50% limited   (Blank rows = not tested)  LOWER EXTREMITY ROM:     Active  Right eval Left eval  Hip flexion 115 deg. 112 deg.  Hip extension    Hip abduction [facility]  Hip adduction    Hip internal rotation 18 deg. 28 deg.  Hip external rotation    Knee flexion 136 deg. 132 deg.   Knee extension 0 deg. 0 deg.   Ankle dorsiflexion    Ankle plantarflexion    Ankle inversion    Ankle eversion     (Blank rows = not tested)  LOWER EXTREMITY MMT:    MMT Right eval Left eval  Hip flexion 4 4  Hip extension    Hip abduction 4 4  Hip adduction    Hip internal rotation 3+ 3+  Hip external rotation 3+ 3+  Knee flexion 4+ 4+  Knee extension 4 5  Ankle dorsiflexion 4 4  Ankle plantarflexion    Ankle inversion    Ankle eversion     (Blank rows = not tested)  LUMBAR SPECIAL TESTS:  FABER test: Negative  FUNCTIONAL TESTS:  5 times sit to stand: TBD  GAIT: Distance walked: in clinic Assistive device utilized: None Level of assistance: Modified independence Comments: Pt. Ambulates with consistent recip. Gait pattern  with no assistive devices.  Pt. Has rounded shoulder posture with limited arm swing.  Good heel strike/ clearance.    TREATMENT DATE:   01/18/24               Subjective:  Pt. c/o 1/10 back pain currently.  Pt. States dental work is still throbbing and has started pain meds. to get some sleep.    There.ex.:  Nustep L4 for 10 minutes B UE/LE to improve strength and cardiorespiratory endurance.  Discussed back pain.    Nautilus: Palloff press 20# L/R 3x5 each.    Supine RTB marching/ hip abduction/ dead bug x 20 each.  Seated TrA: RTB scap. Retraction/ B shoulder ER 20x.     Reviewed HEP  Manual:  Supine LE/lumbar stretches (generalized)- 14 min.  Focus on FABER/ hip IR/ ER/ proximal and distal hamstring/ lumbar rotn.  R LE tighter in hip as compared to L.    Seated STM to mid-thoracic/lumbar paraspinals with use of Hypervolt    PATIENT EDUCATION:  Education details: Aquatic ex./ stretches/ HEP Person educated: Patient Education method: Medical illustrator Education comprehension: verbalized understanding and returned demonstration  HOME EXERCISE PROGRAM: Discussed ex. With Terri Fester -static floor, cervical rotn., piriformis stretch, hip rotn., pelvic tilts, hip rotn. With bent knee, floor press (lying hip adductor stretch), hip abduction, shoulder rotn. In supine, overhead extension (bent legs).     ASSESSMENT:  CLINICAL IMPRESSION:   PT tx. session focused on core activation and BLE strengthening along with stretches and STM to lumbar paraspinals. Pt. tolerates session well with no increase in pain.  No increase c/o tenderness during use of Hypervolt to lumbar paraspinals.  Pt. will benefit from skilled PT services to develop progressive HEP to increase generalized strength, esp. Core/lumbar musculature to improve pain-free functional mobility.      OBJECTIVE IMPAIRMENTS: Abnormal gait, decreased activity tolerance, decreased endurance, decreased mobility, difficulty walking, decreased ROM, decreased strength, impaired flexibility, improper body mechanics, postural dysfunction, and pain.   ACTIVITY  LIMITATIONS: carrying, lifting, bending, standing, squatting, transfers, and locomotion level  PARTICIPATION LIMITATIONS: cleaning, laundry, driving, shopping, and community activity  PERSONAL FACTORS: Fitness and Past/current experiences are also affecting patient's functional outcome.   REHAB POTENTIAL: Good  CLINICAL DECISION MAKING: Evolving/moderate complexity  EVALUATION COMPLEXITY: Moderate   GOALS: Goals reviewed with patient? Yes  SHORT TERM GOALS: Target date: 01/06/24  Pt. Independent with HEP to progress LE muscle strength 1/2 muscle grade to improve pain-free mobility.   Baseline: Goal status: Partially met  LONG TERM GOALS: Target date: 03/02/24  Pt. Will decrease MODI to <40 to improve pain-free mobility.   Baseline: initial 64% self-perceived extreme disability.   Goal status: INITIAL  2.  Pt. Will increase lumbar AROM 25% to improve functional mobility/ household tasks.   Baseline: See above Goal status: INITIAL  3.  Pt. Will report <5/10 back pain at worst with daily household tasks.   Baseline: 8/10 pain at worst Goal status: INITIAL  4.  Pt. Will increase B LE/core muscle strength to 4+/5 MMT to improve standing/walking tasks in preparation for spinal surgery.   Baseline: see above Goal status: INITIAL  PLAN:  PT FREQUENCY: 1x/week  PT DURATION: 12 weeks  PLANNED INTERVENTIONS: 97110-Therapeutic exercises, 97530- Therapeutic activity, [id number]- Neuromuscular re-education, 97535- Self Care, 16109- Manual therapy, [phone]- Gait training, [phone]- Aquatic Therapy, Patient/Family education, Balance training, Stair training, Dry Needling, Joint mobilization, Cryotherapy, and Moist heat.  PLAN FOR NEXT SESSION: Progress HEP/ discuss Terri Fester ex.  Lendell Quarry, PT, DPT # [phone] Physical Therapist - [facility]  01/18/2024, 12:23 PM

## 2024-01-20 ENCOUNTER — Encounter: Admitting: Physical Therapy

## 2024-01-26 ENCOUNTER — Ambulatory Visit: Payer: Medicare HMO

## 2024-01-26 VITALS — Ht 67.0 in | Wt 185.0 lb

## 2024-01-26 DIAGNOSIS — Z0000 Encounter for general adult medical examination without abnormal findings: Secondary | ICD-10-CM

## 2024-01-26 NOTE — Patient Instructions (Addendum)
 Ms. Jennifer Morrow , Thank you for taking time out of your busy schedule to complete your Annual Wellness Visit with me. I enjoyed our conversation and look forward to speaking with you again next year. I, as well as your care team,  appreciate your ongoing commitment to your health goals. Please review the following plan we discussed and let me know if I can assist you in the future. Your Game plan/ To Do List    Referrals: None  Follow up Visits: Next Medicare AWV with our clinical staff: 01/31/25 @ 2:40pm (PHONE VISIT)   Have you seen your provider in the last 6 months (3 months if uncontrolled diabetes)? Yes Next Office Visit with your provider: patient declined to schedule appointment prior to her CPE which is due 09/2024  Clinician Recommendations: You are due for a tetanus vaccine. You may get this at your pharmacy.Good luck with your upcoming spine surgery. I hope the surgery is successful and you get pain relief.  Aim for 30 minutes of exercise or brisk walking, 6-8 glasses of water, and 5 servings of fruits and vegetables each day.       This is a list of the screening recommended for you and due dates:  Health Maintenance  Topic Date Due   COVID-19 Vaccine (8 - 2024-25 season) 06/29/2023   DTaP/Tdap/Td vaccine (3 - Td or Tdap) 01/10/2024   Flu Shot  03/17/2024   Mammogram  10/26/2024   Medicare Annual Wellness Visit  01/25/2025   Colon Cancer Screening  03/14/2030   Pneumonia Vaccine  Completed   DEXA scan (bone density measurement)  Completed   Hepatitis C Screening  Completed   Zoster (Shingles) Vaccine  Completed   HPV Vaccine  Aged Out   Meningitis B Vaccine  Aged Out    Advanced directives: (Copy Requested) Please bring a copy of your health care power of attorney and living will to the office to be added to your chart at your convenience. You can mail to [facility] [address] [place], [place] [address] or email to [email or web address] Advance Care  Planning is important because it:  [x]  Makes sure you receive the medical care that is consistent with your values, goals, and preferences  [x]  It provides guidance to your family and loved ones and reduces their decisional burden about whether or not they are making the right decisions based on your wishes.  Follow the link provided in your after visit summary or read over the paperwork we have mailed to you to help you started getting your Advance Directives in place. If you need assistance in completing these, please reach out to us  so that we can help you!  See attachments for Preventive Care and Fall Prevention Tips.   Fall Prevention in the Home, Adult Falls can cause injuries and affect people of all ages. There are many simple things that you can do to make your home safe and to help prevent falls. If you need it, ask for help making these changes. What actions can I take to prevent falls? General information Use good lighting in all rooms. Make sure to: Replace any light bulbs that burn out. Turn on lights if it is dark and use night-lights. Keep items that you use often in easy-to-reach places. Lower the shelves around your home if needed. Move furniture so that there are clear paths around it. Do not keep throw rugs or other things on the floor that can make you trip. If  any of your floors are uneven, fix them. Add color or contrast paint or tape to clearly mark and help you see: Grab bars or handrails. First and last steps of staircases. Where the edge of each step is. If you use a ladder or stepladder: Make sure that it is fully opened. Do not climb a closed ladder. Make sure the sides of the ladder are locked in place. Have someone hold the ladder while you use it. Know where your pets are as you move through your home. What can I do in the bathroom?     Keep the floor dry. Clean up any water that is on the floor right away. Remove soap buildup in the bathtub or  shower. Buildup makes bathtubs and showers slippery. Use non-skid mats or decals on the floor of the bathtub or shower. Attach bath mats securely with double-sided, non-slip rug tape. If you need to sit down while you are in the shower, use a non-slip stool. Install grab bars by the toilet and in the bathtub and shower. Do not use towel bars as grab bars. What can I do in the bedroom? Make sure that you have a light by your bed that is easy to reach. Do not use any sheets or blankets on your bed that hang to the floor. Have a firm bench or chair with side arms that you can use for support when you get dressed. What can I do in the kitchen? Clean up any spills right away. If you need to reach something above you, use a sturdy step stool that has a grab bar. Keep electrical cables out of the way. Do not use floor polish or wax that makes floors slippery. What can I do with my stairs? Do not leave anything on the stairs. Make sure that you have a light switch at the top and the bottom of the stairs. Have them installed if you do not have them. Make sure that there are handrails on both sides of the stairs. Fix handrails that are broken or loose. Make sure that handrails are as long as the staircases. Install non-slip stair treads on all stairs in your home if they do not have carpet. Avoid having throw rugs at the top or bottom of stairs, or secure the rugs with carpet tape to prevent them from moving. Choose a carpet design that does not hide the edge of steps on the stairs. Make sure that carpet is firmly attached to the stairs. Fix any carpet that is loose or worn. What can I do on the outside of my home? Use bright outdoor lighting. Repair the edges of walkways and driveways and fix any cracks. Clear paths of anything that can make you trip, such as tools or rocks. Add color or contrast paint or tape to clearly mark and help you see high doorway thresholds. Trim any bushes or trees on the  main path into your home. Check that handrails are securely fastened and in good repair. Both sides of all steps should have handrails. Install guardrails along the edges of any raised decks or porches. Have leaves, snow, and ice cleared regularly. Use sand, salt, or ice melt on walkways during winter months if you live where there is ice and snow. In the garage, clean up any spills right away, including grease or oil spills. What other actions can I take? Review your medicines with your health care provider. Some medicines can make you confused or feel dizzy. This can increase  your chance of falling. Wear closed-toe shoes that fit well and support your feet. Wear shoes that have rubber soles and low heels. Use a cane, walker, scooter, or crutches that help you move around if needed. Talk with your provider about other ways that you can decrease your risk of falls. This may include seeing a physical therapist to learn to do exercises to improve movement and strength. Where to find more information Centers for Disease Control and Prevention, STEADI: TonerPromos.no [employer] on Aging: BaseRingTones.pl National Institute on Aging: BaseRingTones.pl Contact a health care provider if: You are afraid of falling at home. You feel weak, drowsy, or dizzy at home. You fall at home. Get help right away if you: Lose consciousness or have trouble moving after a fall. Have a fall that causes a head injury. These symptoms may be an emergency. Get help right away. Call 911. Do not wait to see if the symptoms will go away. Do not drive yourself to the hospital. This information is not intended to replace advice given to you by your health care provider. Make sure you discuss any questions you have with your health care provider. Document Revised: 04/06/2022 Document Reviewed: 04/06/2022 Elsevier Patient Education  2024 Elsevier Inc.  Managing Pain Without Opioids Opioids are strong medicines used to treat  moderate to severe pain. For some people, especially those who have long-term (chronic) pain, opioids may not be the best choice for pain management due to: Side effects like nausea, constipation, and sleepiness. The risk of addiction (opioid use disorder). The longer you take opioids, the greater your risk of addiction. Pain that lasts for more than 3 months is called chronic pain. Managing chronic pain usually requires more than one approach and is often provided by a team of health care providers working together (multidisciplinary approach). Pain management may be done at a pain management center or pain clinic. How to manage pain without the use of opioids Use non-opioid medicines Non-opioid medicines for pain may include: Over-the-counter or prescription non-steroidal anti-inflammatory drugs (NSAIDs). These may be the first medicines used for pain. They work well for muscle and bone pain, and they reduce swelling. Acetaminophen. This over-the-counter medicine may work well for milder pain but not swelling. Antidepressants. These may be used to treat chronic pain. A certain type of antidepressant (tricyclics) is often used. These medicines are given in lower doses for pain than when used for depression. Anticonvulsants. These are usually used to treat seizures but may also reduce nerve (neuropathic) pain. Muscle relaxants. These relieve pain caused by sudden muscle tightening (spasms). You may also use a pain medicine that is applied to the skin as a patch, cream, or gel (topical analgesic), such as a numbing medicine. These may cause fewer side effects than medicines taken by mouth. Do certain therapies as directed Some therapies can help with pain management. They include: Physical therapy. You will do exercises to gain strength and flexibility. A physical therapist may teach you exercises to move and stretch parts of your body that are weak, stiff, or painful. You can learn these exercises at  physical therapy visits and practice them at home. Physical therapy may also involve: Massage. Heat wraps or applying heat or cold to affected areas. Electrical signals that interrupt pain signals (transcutaneous electrical nerve stimulation, TENS). Weak lasers that reduce pain and swelling (low-level laser therapy). Signals from your body that help you learn to regulate pain (biofeedback). Occupational therapy. This helps you to learn ways to function at  home and work with less pain. Recreational therapy. This involves trying new activities or hobbies, such as a physical activity or drawing. Mental health therapy, including: Cognitive behavioral therapy (CBT). This helps you learn coping skills for dealing with pain. Acceptance and commitment therapy (ACT) to change the way you think and react to pain. Relaxation therapies, including muscle relaxation exercises and mindfulness-based stress reduction. Pain management counseling. This may be individual, family, or group counseling.  Receive medical treatments Medical treatments for pain management include: Nerve block injections. These may include a pain blocker and anti-inflammatory medicines. You may have injections: Near the spine to relieve chronic back or neck pain. Into joints to relieve back or joint pain. Into nerve areas that supply a painful area to relieve body pain. Into muscles (trigger point injections) to relieve some painful muscle conditions. A medical device placed near your spine to help block pain signals and relieve nerve pain or chronic back pain (spinal cord stimulation device). Acupuncture. Follow these instructions at home Medicines Take over-the-counter and prescription medicines only as told by your health care provider. If you are taking pain medicine, ask your health care providers about possible side effects to watch out for. Do not drive or use heavy machinery while taking prescription opioid pain  medicine. Lifestyle  Do not use drugs or alcohol to reduce pain. If you drink alcohol, limit how much you have to: 0-1 drink a day for women who are not pregnant. 0-2 drinks a day for men. Know how much alcohol is in a drink. In the [place] one drink equals one 12 oz bottle of beer (355 mL), one 5 oz glass of wine (148 mL), or one 1 oz glass of hard liquor (44 mL). Do not use any products that contain nicotine or tobacco. These products include cigarettes, chewing tobacco, and vaping devices, such as e-cigarettes. If you need help quitting, ask your health care provider. Eat a healthy diet and maintain a healthy weight. Poor diet and excess weight may make pain worse. Eat foods that are high in fiber. These include fresh fruits and vegetables, whole grains, and beans. Limit foods that are high in fat and processed sugars, such as fried and sweet foods. Exercise regularly. Exercise lowers stress and may help relieve pain. Ask your health care provider what activities and exercises are safe for you. If your health care provider approves, join an exercise class that combines movement and stress reduction. Examples include yoga and tai chi. Get enough sleep. Lack of sleep may make pain worse. Lower stress as much as possible. Practice stress reduction techniques as told by your therapist. General instructions Work with all your pain management providers to find the treatments that work best for you. You are an important member of your pain management team. There are many things you can do to reduce pain on your own. Consider joining an online or in-person support group for people who have chronic pain. Keep all follow-up visits. This is important. Where to find more information You can find more information about managing pain without opioids from: American Academy of Pain Medicine: painmed.org Institute for Chronic Pain: instituteforchronicpain.org American Chronic Pain Association:  theacpa.org Contact a health care provider if: You have side effects from pain medicine. Your pain gets worse or does not get better with treatments or home therapy. You are struggling with anxiety or depression. Summary Many types of pain can be managed without opioids. Chronic pain may respond better to pain management without opioids. Pain  is best managed when you and a team of health care providers work together. Pain management without opioids may include non-opioid medicines, medical treatments, physical therapy, mental health therapy, and lifestyle changes. Tell your health care providers if your pain gets worse or is not being managed well enough. This information is not intended to replace advice given to you by your health care provider. Make sure you discuss any questions you have with your health care provider. Document Revised: 11/13/2020 Document Reviewed: 11/13/2020 Elsevier Patient Education  2024 [employer].

## 2024-01-26 NOTE — Progress Notes (Signed)
 Subjective:   Jennifer Morrow is a 73 y.o. who presents for a Medicare Wellness preventive visit.  As a reminder, Annual Wellness Visits don't include a physical exam, and some assessments may be limited, especially if this visit is performed virtually. We may recommend an in-person follow-up visit with your provider if needed.  Visit Complete: Virtual I connected with  Jennifer Morrow on 01/26/24 by a audio enabled telemedicine application and verified that I am speaking with the correct person using two identifiers.  Patient Location: Home  Provider Location: Home Office  I discussed the limitations of evaluation and management by telemedicine. The patient expressed understanding and agreed to proceed.  Vital Signs: Because this visit was a virtual/telehealth visit, some criteria may be missing or patient reported. Any vitals not documented were not able to be obtained and vitals that have been documented are patient reported.  VideoDeclined- This patient declined Librarian, academic. Therefore the visit was completed with audio only.  Persons Participating in Visit: Patient.  AWV Questionnaire: Yes: Patient Medicare AWV questionnaire was completed by the patient on 01/23/24; I have confirmed that all information answered by patient is correct and no changes since this date.  Cardiac Risk Factors include: advanced age (>58men, >27 women);dyslipidemia     Objective:     Today's Vitals   01/26/24 1445  Weight: 185 lb (83.9 kg)  Height: 5' 7 (1.702 m)  PainSc: 3    Body mass index is 28.98 kg/m.     01/26/2024    3:02 PM 01/20/2023    3:35 PM 03/03/2022    1:03 PM 01/14/2022   11:26 AM 02/10/2018    7:42 AM  Advanced Directives  Does Patient Have a Medical Advance Directive? Yes No Yes Yes No  Type of Estate agent of [place];Living will  Healthcare Power of [place];Living will Healthcare Power of [place];Living will   Does  patient want to make changes to medical advance directive? No - Patient declined  Yes (Inpatient - patient defers changing a medical advance directive and declines information at this time)    Copy of Healthcare Power of Attorney in Chart? No - copy requested  No - copy requested No - copy requested   Would patient like information on creating a medical advance directive?  No - Patient declined       Current Medications (verified) Outpatient Encounter Medications as of 01/26/2024  Medication Sig   Calcium-Vitamin D -Vitamin K 650-12.5-40 MG-MCG-MCG CHEW    cetirizine (ZYRTEC) 10 MG tablet Take 10 mg by mouth daily as needed for allergies.   gabapentin (NEURONTIN) 600 MG tablet Take 600 mg by mouth 3 (three) times daily.   HYDROcodone-acetaminophen (NORCO/VICODIN) 5-325 MG tablet Take by mouth as needed. Pt taking 5 times per day   meloxicam (MOBIC) 15 MG tablet Take 15 mg by mouth as needed for pain.   oxyCODONE (OXY IR/ROXICODONE) 5 MG immediate release tablet Take 5 mg by mouth every 6 (six) hours as needed.   tiZANidine (ZANAFLEX) 2 MG tablet Take 2 mg by mouth daily as needed for muscle pain.   triamcinolone  (NASACORT  ALLERGY 24HR) 55 MCG/ACT AERO nasal inhaler Place 2 sprays into the nose 2 (two) times daily.   Vitamin D , Ergocalciferol , (DRISDOL) 1.25 MG (50000 UNIT) CAPS capsule Take 50,000 Units by mouth every 7 (seven) days.   gabapentin (NEURONTIN) 300 MG capsule Take 2 capsules by mouth 3 (three) times daily. (Patient not taking: Reported on 01/26/2024)  Romosozumab -aqqg (EVENITY ) 105 MG/1. SOSY injection Inject into the skin. (Patient not taking: Reported on 01/26/2024)   No facility-administered encounter medications on file as of 01/26/2024.    Allergies (verified) Celecoxib, Oxaprozin, and Terbinafine   History: Past Medical History:  Diagnosis Date   Allergy 2014   seasonal   Arthritis    Bronchitis    CHRONIC   Bulging lumbar disc    degenerative dics   Cataract     corrected with surgery   Scoliosis    LIMITS LUNG FUNCTION   Spinal stenosis    Past Surgical History:  Procedure Laterality Date   CATARACT EXTRACTION W/PHACO Left 01/06/2018   Procedure: CATARACT EXTRACTION PHACO AND INTRAOCULAR LENS PLACEMENT (IOC);  Surgeon: Rosa College, MD;  Location: [facility] ORS;  Service: Ophthalmology;  Laterality: Left;  US  00:27.5 AP% 4.3 CDE 1.18 FLUID PACK LOT # [id number]   CATARACT EXTRACTION W/PHACO Right 02/10/2018   Procedure: CATARACT EXTRACTION PHACO AND INTRAOCULAR LENS PLACEMENT (IOC);  Surgeon: Rosa College, MD;  Location: [facility] ORS;  Service: Ophthalmology;  Laterality: Right;  US  00:30.5 AP% 5.0 CDE 1.51  Fluid pack lot # [id number]   EYE SURGERY  2019   cataract   FOOT X 2     HEEL SPUR EXCISION     TONSILLECTOMY     Family History  Problem Relation Age of Onset   Heart disease Mother    Depression Mother    Heart disease Father    COPD Father    Hypertension Sister    Anxiety disorder Sister    Asthma Sister    Depression Sister    Hypertension Sister    Asthma Brother    Hearing loss Brother    ADD / ADHD Son    Diabetes Maternal Grandmother    Obesity Maternal Grandmother    Heart disease Maternal Grandfather    Diabetes Maternal Grandfather    Heart disease Paternal Grandmother    Vision loss Paternal Grandmother    Breast cancer Neg Hx    Social History   Socioeconomic History   Marital status: Widowed    Spouse name: Not on file   Number of children: 2   Years of education: Not on file   Highest education level: Bachelor's degree (e.g., BA, AB, BS)  Occupational History   Occupation: retired  Tobacco Use   Smoking status: Never    Passive exposure: Past   Smokeless tobacco: Never  Vaping Use   Vaping status: Never Used  Substance and Sexual Activity   Alcohol use: Yes    Alcohol/week: 1.0 standard drink of alcohol    Types: 1 Glasses of wine per week    Comment: Social, 1-2 drinks once per week    Drug use: Never   Sexual activity: Yes    Birth control/protection: Post-menopausal, None  Other Topics Concern   Not on file  Social History Narrative   Pt has had one miscarriage   Pt lives alone   Social Drivers of Health   Financial Resource Strain: Low Risk  (01/26/2024)   Overall Financial Resource Strain (CARDIA)    Difficulty of Paying Living Expenses: Not hard at all  Food Insecurity: No Food Insecurity (01/26/2024)   Hunger Vital Sign    Worried About Running Out of Food in the Last Year: Never true    Ran Out of Food in the Last Year: Never true  Transportation Needs: No Transportation Needs (01/26/2024)   PRAPARE - Transportation  Lack of Transportation (Medical): No    Lack of Transportation (Non-Medical): No  Physical Activity: Sufficiently Active (01/26/2024)   Exercise Vital Sign    Days of Exercise per Week: 4 days    Minutes of Exercise per Session: 60 min  Stress: No Stress Concern Present (01/26/2024)   [employer] of Occupational Health - Occupational Stress Questionnaire    Feeling of Stress : Not at all  Social Connections: Moderately Integrated (01/26/2024)   Social Connection and Isolation Panel [NHANES]    Frequency of Communication with Friends and Family: Three times a week    Frequency of Social Gatherings with Friends and Family: Three times a week    Attends Religious Services: 1 to 4 times per year    Active Member of Clubs or Organizations: Yes    Attends Banker Meetings: More than 4 times per year    Marital Status: Widowed    Tobacco Counseling Counseling given: Not Answered    Clinical Intake:  Pre-visit preparation completed: Yes  Pain : 0-10 Pain Score: 3  Pain Type: Chronic pain Pain Location: Back Pain Descriptors / Indicators: Aching     BMI - recorded: 28.98 Nutritional Status: BMI 25 -29 Overweight Nutritional Risks: None Diabetes: No  Lab Results  Component Value Date   HGBA1C 5.8 (H)  10/14/2023     How often do you need to have someone help you when you read instructions, pamphlets, or other written materials from your doctor or pharmacy?: 1 - Never  Interpreter Needed?: No  Information entered by :: Jaunita Messier, CMA   Activities of Daily Living     01/26/2024    2:47 PM  In your present state of health, do you have any difficulty performing the following activities:  Hearing? 0  Vision? 0  Difficulty concentrating or making decisions? 0  Walking or climbing stairs? 1  Comment sometimes due to back pain, uses cane prn  Dressing or bathing? 0  Doing errands, shopping? 0  Preparing Food and eating ? N  Using the Toilet? N  In the past six months, have you accidently leaked urine? Y  Comment 1 drop after she finishes urinating  Do you have problems with loss of bowel control? N  Managing your Medications? N  Managing your Finances? N  Housekeeping or managing your Housekeeping? N    Patient Care Team: Sheron Dixons, MD as PCP - General (Internal Medicine) Berton Brock, MD as Consulting Physician (Endocrinology) Mariane Shire, MD (Dermatology) Rosa College, MD as Consulting Physician (Ophthalmology) Rococs, Jessee Mormon, MD as Referring Physician (Spine Surgery) Maire Scot, MD as Consulting Physician (Pulmonary Disease)  I have updated your Care Teams any recent Medical Services you may have received from other providers in the past year.     Assessment:    This is a routine wellness examination for Jennifer Morrow.  Hearing/Vision screen Hearing Screening - Comments:: Denies hearing loss Vision Screening - Comments:: Gets eye exams, Dr. Dusty Gin, [place], [place]   Goals Addressed             This Visit's Progress    Patient Stated       Have back surgery and recover       Depression Screen     01/26/2024    2:56 PM 10/14/2023    8:09 AM 10/14/2023    8:08 AM 07/19/2023    9:09 AM 01/20/2023    3:33 PM  09/29/2022    8:03  AM 09/09/2022    9:10 AM  PHQ 2/9 Scores  PHQ - 2 Score 3 0 0 2 0 1 0  PHQ- 9 Score 6 3  10  0 8 6    Fall Risk     01/26/2024    3:03 PM 10/14/2023    1:09 PM 10/14/2023    8:08 AM 07/19/2023    9:09 AM 01/20/2023    3:35 PM  Fall Risk   Falls in the past year? 0 0 0 0 0  Number falls in past yr: 0  0 0 0  Injury with Fall? 0  0 0 0  Risk for fall due to : Impaired balance/gait;Impaired mobility  No Fall Risks No Fall Risks No Fall Risks  Follow up Falls evaluation completed;Education provided  Falls evaluation completed Falls evaluation completed Falls prevention discussed;Falls evaluation completed    MEDICARE RISK AT HOME:  Medicare Risk at Home Any stairs in or around the home?: Yes If so, are there any without handrails?: No Home free of loose throw rugs in walkways, pet beds, electrical cords, etc?: Yes Adequate lighting in your home to reduce risk of falls?: Yes Life alert?: Yes (smart watch that sends alert with falls) Use of a cane, walker or w/c?: Yes (uses cane as needed) Grab bars in the bathroom?: Yes Shower chair or bench in shower?: Yes Elevated toilet seat or a handicapped toilet?: Yes  TIMED UP AND GO:  Was the test performed?  No  Cognitive Function: 6CIT completed        01/26/2024    3:04 PM 01/20/2023    3:43 PM  6CIT Screen  What Year? 0 points 0 points  What month? 0 points 0 points  What time? 0 points 0 points  Count back from 20 0 points 0 points  Months in reverse 0 points 0 points  Repeat phrase 0 points 0 points  Total Score 0 points 0 points    Immunizations Immunization History  Administered Date(s) Administered   Fluad Quad(high Dose 65+) 05/01/2019, 05/19/2021   Hepatitis A 11/01/2002, 05/01/2003   Hepatitis B, ADULT 08/17/1990   IPV 08/17/1990   Influenza, High Dose Seasonal PF 05/07/2017, 05/05/2018, 04/06/2020   Influenza, Seasonal, Injecte, Preservative Fre 05/09/2013   Influenza,inj,Quad PF,6+ Mos  05/20/2016   Influenza-Unspecified 06/08/2015, 04/21/2022, 05/04/2023   Meningococcal Conjugate 06/28/2018   PFIZER Comirnaty(Gray Top)Covid-19 Tri-Sucrose Vaccine 04/06/2020   PFIZER(Purple Top)SARS-COV-2 Vaccination 09/09/2019, 10/01/2019, 05/02/2022, 05/04/2023   PNEUMOCOCCAL CONJUGATE-20 09/23/2021   Pfizer Covid-19 Vaccine Bivalent Booster 21yrs & up 05/19/2021, 01/08/2022   Pneumococcal Conjugate-13 07/26/2018   Pneumococcal Polysaccharide-23 07/21/2017   Rabies Immune Globulin 06/28/2018, 07/05/2018, 07/19/2018   Respiratory Syncytial Virus Vaccine,Recomb Aduvanted(Arexvy) 04/21/2022   Rubella 08/17/1981   Smallpox 08/17/1981   Td 08/17/1994   Tdap 01/09/2014   Typhoid Live 06/28/2018   Yellow Fever 06/03/2018   Zoster Recombinant(Shingrix) 11/02/2017, 03/08/2018   Zoster, Live 05/10/2013    Screening Tests Health Maintenance  Topic Date Due   COVID-19 Vaccine (8 - 2024-25 season) 06/29/2023   DTaP/Tdap/Td (3 - Td or Tdap) 01/10/2024   INFLUENZA VACCINE  03/17/2024   MAMMOGRAM  10/26/2024   Medicare Annual Wellness (AWV)  01/25/2025   Colonoscopy  03/14/2030   Pneumonia Vaccine 56+ Years old  Completed   DEXA SCAN  Completed   Hepatitis C Screening  Completed   Zoster Vaccines- Shingrix  Completed   HPV VACCINES  Aged Out   Meningococcal B Vaccine  Aged Out  Health Maintenance  Health Maintenance Due  Topic Date Due   COVID-19 Vaccine (8 - 2024-25 season) 06/29/2023   DTaP/Tdap/Td (3 - Td or Tdap) 01/10/2024   Health Maintenance Items Addressed: See Nurse Notes at the end of this note  Additional Screening:  Vision Screening: Recommended annual ophthalmology exams for early detection of glaucoma and other disorders of the eye. Would you like a referral to an eye doctor? No    Dental Screening: Recommended annual dental exams for proper oral hygiene  Community Resource Referral / Chronic Care Management: CRR required this visit?  No   CCM required  this visit?  No   Plan:    I have personally reviewed and noted the following in the patient's chart:   Medical and social history Use of alcohol, tobacco or illicit drugs  Current medications and supplements including opioid prescriptions. Patient is currently taking opioid prescriptions. Information provided to patient regarding non-opioid alternatives. Patient advised to discuss non-opioid treatment plan with their provider. Functional ability and status Nutritional status Physical activity Advanced directives List of other physicians Hospitalizations, surgeries, and ER visits in previous 12 months Vitals Screenings to include cognitive, depression, and falls Referrals and appointments  In addition, I have reviewed and discussed with patient certain preventive protocols, quality metrics, and best practice recommendations. A written personalized care plan for preventive services as well as general preventive health recommendations were provided to patient.   Jaunita Messier, CMA   01/26/2024   After Visit Summary: (MyChart) Due to this being a telephonic visit, the after visit summary with patients personalized plan was offered to patient via MyChart   Notes:  Needs Tdap (pharmacy) Endocrinology follows DEXA scan

## 2024-01-27 ENCOUNTER — Encounter: Payer: Self-pay | Admitting: Physical Therapy

## 2024-01-27 ENCOUNTER — Ambulatory Visit: Admitting: Physical Therapy

## 2024-01-27 DIAGNOSIS — M6281 Muscle weakness (generalized): Secondary | ICD-10-CM

## 2024-01-27 DIAGNOSIS — M2560 Stiffness of unspecified joint, not elsewhere classified: Secondary | ICD-10-CM

## 2024-01-27 DIAGNOSIS — M4126 Other idiopathic scoliosis, lumbar region: Secondary | ICD-10-CM

## 2024-01-27 NOTE — Therapy (Signed)
 OUTPATIENT PHYSICAL THERAPY THORACOLUMBAR TREATMENT  Patient Name: Jennifer Morrow MRN: [id number] DOB:1950/10/12, 73 y.o., female Today's Date: 01/27/2024  END OF SESSION:  PT End of Session - 01/27/24 0726     Visit Number 5    Number of Visits 12    Date for PT Re-Evaluation 03/02/24    PT Start Time 0726    Activity Tolerance Patient tolerated treatment well    Behavior During Therapy [facility] for tasks assessed/performed         Past Medical History:  Diagnosis Date   Allergy 2014   seasonal   Arthritis    Bronchitis    CHRONIC   Bulging lumbar disc    degenerative dics   Cataract    corrected with surgery   Scoliosis    LIMITS LUNG FUNCTION   Spinal stenosis    Past Surgical History:  Procedure Laterality Date   CATARACT EXTRACTION W/PHACO Left 01/06/2018   Procedure: CATARACT EXTRACTION PHACO AND INTRAOCULAR LENS PLACEMENT (IOC);  Surgeon: Rosa College, MD;  Location: [facility] ORS;  Service: Ophthalmology;  Laterality: Left;  US  00:27.5 AP% 4.3 CDE 1.18 FLUID PACK LOT # [id number]   CATARACT EXTRACTION W/PHACO Right 02/10/2018   Procedure: CATARACT EXTRACTION PHACO AND INTRAOCULAR LENS PLACEMENT (IOC);  Surgeon: Rosa College, MD;  Location: [facility] ORS;  Service: Ophthalmology;  Laterality: Right;  US  00:30.5 AP% 5.0 CDE 1.51  Fluid pack lot # [id number]   EYE SURGERY  2019   cataract   FOOT X 2     HEEL SPUR EXCISION     TONSILLECTOMY     Patient Active Problem List   Diagnosis Date Noted   Elevated liver enzymes 06/14/2023   Osteoarthritis cervical spine 01/14/2022   Age-related osteoporosis without current pathological fracture 05/19/2021   Spondylosis of lumbar region without myelopathy or radiculopathy 05/19/2021   Degenerative disc disease, lumbar 05/19/2021   Scoliosis, unspecified 01/09/2015   Vitamin D  deficiency 01/08/2014   Mixed hyperlipidemia 01/08/2014   PCP: Janna Melter, MD  REFERRING PROVIDER: [name], MD  REFERRING DIAG:  M41.9 (ICD-10-CM) - Scoliosis, unspecified   Rationale for Evaluation and Treatment: Rehabilitation  THERAPY DIAG:  Other idiopathic scoliosis, lumbar region  Joint stiffness of spine  Muscle weakness (generalized)  ONSET DATE: chronic          SUBJECTIVE:                                                                                                                                                                                           SUBJECTIVE STATEMENT: Pt. Reports chronic low back pain everyday.  Pt. Uses  MH and hot tub daily to manage symptoms.  Increase pain with prolonged standing/ driving and activity during the day.  Pt. Known well to PT clinic and discharged from PT earlier this year.  Pt. Is scheduled for major back surgery with Dr. [name] for spinal fusion after summer.   Pt. Reports L LE/calf/foot N/T and weakness (no falls reported).  Pt. Wants to return to walking with trekking poles and no back symptoms.  Pt. Returned to exercise with Terri Fester (personal trainer) 5 weeks ago and 1st session was difficult/ resulted in several days of soreness. Pt. Scheduled for Terri Fester ex. On Tuesday.  Pt. Having a pulmonary functional test next week for lung clearance for back surgery.   PERTINENT HISTORY:  Pt. Well known to PT clinic.    PAIN:  Are you having pain? Yes: NPRS scale: 3/10 Pain location: low back Pain description: aching/ constant numbness in L LE/ calf/ bottom of foot.  R hip to R foot pain will result in giving out with walking.  Aggravating factors: increase activity/ prolonged standing and walking Relieving factors: heat (hot tub use)  PRECAUTIONS: Back/ scoliosis  RED FLAGS: None   WEIGHT BEARING RESTRICTIONS: No  FALLS:  Has patient fallen in last 6 months? No  LIVING ENVIRONMENT: Lives with: lives alone Lives in: House/apartment Has following equipment at home: Single point cane  OCCUPATION: Retired  PLOF: Independent  PATIENT GOALS: Increase activity  during the day with less back pain.    NEXT MD VISIT: PRN  OBJECTIVE:  Note: Objective measures were completed at Evaluation unless otherwise noted.  DIAGNOSTIC FINDINGS:  See imaging  PATIENT SURVEYS:  MODI: 64% self-perceived extreme disability.    COGNITION: Overall cognitive status: Within functional limits for tasks assessed     SENSATION: L LE to foot N/T  MUSCLE LENGTH: Hamstrings: Right 26 deg; Left 70 deg  POSTURE: rounded shoulders and forward head.  Moderate scoliosis (L to R).    PALPATION: (+) tenderness in R low thoracic/lumbar spine (hypomobile).    LUMBAR ROM:   AROM eval  Flexion 60 deg.  Extension 12 deg.  Right lateral flexion 50% limited  Left lateral flexion 50% limited  Right rotation 50% limited  Left rotation 50% limited   (Blank rows = not tested)  LOWER EXTREMITY ROM:     Active  Right eval Left eval  Hip flexion 115 deg. 112 deg.  Hip extension    Hip abduction [facility]  Hip adduction    Hip internal rotation 18 deg. 28 deg.  Hip external rotation    Knee flexion 136 deg. 132 deg.   Knee extension 0 deg. 0 deg.   Ankle dorsiflexion    Ankle plantarflexion    Ankle inversion    Ankle eversion     (Blank rows = not tested)  LOWER EXTREMITY MMT:    MMT Right eval Left eval  Hip flexion 4 4  Hip extension    Hip abduction 4 4  Hip adduction    Hip internal rotation 3+ 3+  Hip external rotation 3+ 3+  Knee flexion 4+ 4+  Knee extension 4 5  Ankle dorsiflexion 4 4  Ankle plantarflexion    Ankle inversion    Ankle eversion     (Blank rows = not tested)  LUMBAR SPECIAL TESTS:  FABER test: Negative  FUNCTIONAL TESTS:  5 times sit to stand: TBD  GAIT: Distance walked: in clinic Assistive device utilized: None Level of assistance: Modified independence Comments: Pt.  Ambulates with consistent recip. Gait pattern with no assistive devices.  Pt. Has rounded shoulder posture with limited arm swing.  Good heel strike/  clearance.    TREATMENT DATE:  01/27/24               Subjective:  Pt. Taking Hydrocodone about 3x/week to sleep at night.  Pt. Reports no back prior to PT tx. Session and discussed sleeping habits.  Pt. States when she takes a muscle relaxer at night her back will not wake her up.     There.ex.:  Nustep L4 for 10 minutes B UE/LE to improve strength and cardiorespiratory endurance.  Discussed back pain.    Nautilus: Rotation L/R 10# 10x/ Palloff press 30# L/R 2x10 each.   Standing scap. Retraction 30# at Nautilus/ standing lat. Pull downs 30# at Nautilus 20x each.    Supine RTB marching/ hip abduction/ dead bug x 20 each.    Reviewed HEP  Manual:  Supine LE/lumbar stretches (generalized)- 17 min.  Focus on FABER/ hip IR/ ER/ proximal and distal hamstring/ lumbar rotn.     Seated STM to mid-thoracic/lumbar paraspinals with use of Hypervolt    PATIENT EDUCATION:  Education details: Aquatic ex./ stretches/ HEP Person educated: Patient Education method: Medical illustrator Education comprehension: verbalized understanding and returned demonstration  HOME EXERCISE PROGRAM: Discussed ex. With Terri Fester -static floor, cervical rotn., piriformis stretch, hip rotn., pelvic tilts, hip rotn. With bent knee, floor press (lying hip adductor stretch), hip abduction, shoulder rotn. In supine, overhead extension (bent legs).     ASSESSMENT:  CLINICAL IMPRESSION:   PT tx. session focused on core activation and BLE strengthening along with stretches and STM to lumbar paraspinals. Pt. tolerates session well with no increase in pain.  No increase c/o tenderness during use of Hypervolt to lumbar paraspinals.  Pt. Cautious during with resisted Nautilus ex., esp. Rotn tasks.  Pt. will benefit from skilled PT services to develop progressive HEP to increase generalized strength, esp. Core/lumbar musculature to improve pain-free functional mobility.      OBJECTIVE IMPAIRMENTS: Abnormal gait,  decreased activity tolerance, decreased endurance, decreased mobility, difficulty walking, decreased ROM, decreased strength, impaired flexibility, improper body mechanics, postural dysfunction, and pain.   ACTIVITY LIMITATIONS: carrying, lifting, bending, standing, squatting, transfers, and locomotion level  PARTICIPATION LIMITATIONS: cleaning, laundry, driving, shopping, and community activity  PERSONAL FACTORS: Fitness and Past/current experiences are also affecting patient's functional outcome.   REHAB POTENTIAL: Good  CLINICAL DECISION MAKING: Evolving/moderate complexity  EVALUATION COMPLEXITY: Moderate   GOALS: Goals reviewed with patient? Yes  SHORT TERM GOALS: Target date: 01/06/24  Pt. Independent with HEP to progress LE muscle strength 1/2 muscle grade to improve pain-free mobility.   Baseline: Goal status: Partially met  LONG TERM GOALS: Target date: 03/02/24  Pt. Will decrease MODI to <40 to improve pain-free mobility.   Baseline: initial 64% self-perceived extreme disability.   Goal status: INITIAL  2.  Pt. Will increase lumbar AROM 25% to improve functional mobility/ household tasks.   Baseline: See above Goal status: INITIAL  3.  Pt. Will report <5/10 back pain at worst with daily household tasks.   Baseline: 8/10 pain at worst Goal status: INITIAL  4.  Pt. Will increase B LE/core muscle strength to 4+/5 MMT to improve standing/walking tasks in preparation for spinal surgery.   Baseline: see above Goal status: INITIAL  PLAN:  PT FREQUENCY: 1x/week  PT DURATION: 12 weeks  PLANNED INTERVENTIONS: 97110-Therapeutic exercises, 97530- Therapeutic activity, [id number]- Neuromuscular re-education,  60454- Self Care, [id number]- Manual therapy, [id number]- Gait training, [phone]- Aquatic Therapy, Patient/Family education, Balance training, Stair training, Dry Needling, Joint mobilization, Cryotherapy, and Moist heat.  PLAN FOR NEXT SESSION: Progress HEP.  Lendell Quarry, PT,  DPT # [phone] Physical Therapist - [facility]  01/27/2024, 8:24 AM

## 2024-02-01 ENCOUNTER — Other Ambulatory Visit: Payer: Self-pay | Admitting: Medical Genetics

## 2024-02-02 ENCOUNTER — Other Ambulatory Visit
Admission: RE | Admit: 2024-02-02 | Discharge: 2024-02-02 | Disposition: A | Discharge status: Home / Self Care | Payer: Self-pay | Source: Ambulatory Visit | Attending: Medical Genetics | Admitting: Medical Genetics

## 2024-02-03 ENCOUNTER — Ambulatory Visit: Admitting: Physical Therapy

## 2024-02-03 ENCOUNTER — Encounter: Payer: Self-pay | Admitting: Physical Therapy

## 2024-02-03 DIAGNOSIS — M4126 Other idiopathic scoliosis, lumbar region: Secondary | ICD-10-CM | POA: Diagnosis not present

## 2024-02-03 DIAGNOSIS — M6281 Muscle weakness (generalized): Secondary | ICD-10-CM | POA: Diagnosis not present

## 2024-02-03 DIAGNOSIS — M2560 Stiffness of unspecified joint, not elsewhere classified: Secondary | ICD-10-CM | POA: Diagnosis not present

## 2024-02-03 NOTE — Therapy (Signed)
 OUTPATIENT PHYSICAL THERAPY THORACOLUMBAR TREATMENT  Patient Name: Jennifer Morrow MRN: [id number] DOB:July 17, 1951, 73 y.o., female Today's Date: 02/04/2024  END OF SESSION:  PT End of Session - 02/03/24 0730     Visit Number 6    Number of Visits 12    Date for PT Re-Evaluation 03/02/24    PT Start Time 0730    PT Stop Time 0817    PT Time Calculation (min) 47 min    Activity Tolerance Patient tolerated treatment well    Behavior During Therapy [facility] for tasks assessed/performed         Past Medical History:  Diagnosis Date   Allergy 2014   seasonal   Arthritis    Bronchitis    CHRONIC   Bulging lumbar disc    degenerative dics   Cataract    corrected with surgery   Scoliosis    LIMITS LUNG FUNCTION   Spinal stenosis    Past Surgical History:  Procedure Laterality Date   CATARACT EXTRACTION W/PHACO Left 01/06/2018   Procedure: CATARACT EXTRACTION PHACO AND INTRAOCULAR LENS PLACEMENT (IOC);  Surgeon: Myrna Adine Anes, MD;  Location: [facility] ORS;  Service: Ophthalmology;  Laterality: Left;  US  00:27.5 AP% 4.3 CDE 1.18 FLUID PACK LOT # [id number]   CATARACT EXTRACTION W/PHACO Right 02/10/2018   Procedure: CATARACT EXTRACTION PHACO AND INTRAOCULAR LENS PLACEMENT (IOC);  Surgeon: Myrna Adine Anes, MD;  Location: [facility] ORS;  Service: Ophthalmology;  Laterality: Right;  US  00:30.5 AP% 5.0 CDE 1.51  Fluid pack lot # [id number]   EYE SURGERY  2019   cataract   FOOT X 2     HEEL SPUR EXCISION     TONSILLECTOMY     Patient Active Problem List   Diagnosis Date Noted   Elevated liver enzymes 06/14/2023   Osteoarthritis cervical spine 01/14/2022   Age-related osteoporosis without current pathological fracture 05/19/2021   Spondylosis of lumbar region without myelopathy or radiculopathy 05/19/2021   Degenerative disc disease, lumbar 05/19/2021   Scoliosis, unspecified 01/09/2015   Vitamin D  deficiency 01/08/2014   Mixed hyperlipidemia 01/08/2014   PCP: Justus Doffing,  MD  REFERRING PROVIDER: Emi Bills, MD  REFERRING DIAG: M41.9 (ICD-10-CM) - Scoliosis, unspecified   Rationale for Evaluation and Treatment: Rehabilitation  THERAPY DIAG:  Other idiopathic scoliosis, lumbar region  Joint stiffness of spine  Muscle weakness (generalized)  ONSET DATE: chronic          SUBJECTIVE:  SUBJECTIVE STATEMENT: Pt. Reports chronic low back pain everyday.  Pt. Uses MH and hot tub daily to manage symptoms.  Increase pain with prolonged standing/ driving and activity during the day.  Pt. Known well to PT clinic and discharged from PT earlier this year.  Pt. Is scheduled for major back surgery with Dr. Emi for spinal fusion after summer.   Pt. Reports L LE/calf/foot N/T and weakness (no falls reported).  Pt. Wants to return to walking with trekking poles and no back symptoms.  Pt. Returned to exercise with [employer] (personal trainer) 5 weeks ago and 1st session was difficult/ resulted in several days of soreness. Pt. Scheduled for [employer] ex. On Tuesday.  Pt. Having a pulmonary functional test next week for lung clearance for back surgery.   PERTINENT HISTORY:  Pt. Well known to PT clinic.    PAIN:  Are you having pain? Yes: NPRS scale: 3/10 Pain location: low back Pain description: aching/ constant numbness in L LE/ calf/ bottom of foot.  R hip to R foot pain will result in giving out with walking.  Aggravating factors: increase activity/ prolonged standing and walking Relieving factors: heat (hot tub use)  PRECAUTIONS: Back/ scoliosis  RED FLAGS: None   WEIGHT BEARING RESTRICTIONS: No  FALLS:  Has patient fallen in last 6 months? No  LIVING ENVIRONMENT: Lives with: lives alone Lives in: House/apartment Has following equipment at home: Single point cane  OCCUPATION:  Retired  PLOF: Independent  PATIENT GOALS: Increase activity during the day with less back pain.    NEXT MD VISIT: PRN  OBJECTIVE:  Note: Objective measures were completed at Evaluation unless otherwise noted.  DIAGNOSTIC FINDINGS:  See imaging  PATIENT SURVEYS:  MODI: 64% self-perceived extreme disability.    COGNITION: Overall cognitive status: Within functional limits for tasks assessed     SENSATION: L LE to foot N/T  MUSCLE LENGTH: Hamstrings: Right 26 deg; Left 70 deg  POSTURE: rounded shoulders and forward head.  Moderate scoliosis (L to R).    PALPATION: (+) tenderness in R low thoracic/lumbar spine (hypomobile).    LUMBAR ROM:   AROM eval  Flexion 60 deg.  Extension 12 deg.  Right lateral flexion 50% limited  Left lateral flexion 50% limited  Right rotation 50% limited  Left rotation 50% limited   (Blank rows = not tested)  LOWER EXTREMITY ROM:     Active  Right eval Left eval  Hip flexion 115 deg. 112 deg.  Hip extension    Hip abduction [facility]  Hip adduction    Hip internal rotation 18 deg. 28 deg.  Hip external rotation    Knee flexion 136 deg. 132 deg.   Knee extension 0 deg. 0 deg.   Ankle dorsiflexion    Ankle plantarflexion    Ankle inversion    Ankle eversion     (Blank rows = not tested)  LOWER EXTREMITY MMT:    MMT Right eval Left eval  Hip flexion 4 4  Hip extension    Hip abduction 4 4  Hip adduction    Hip internal rotation 3+ 3+  Hip external rotation 3+ 3+  Knee flexion 4+ 4+  Knee extension 4 5  Ankle dorsiflexion 4 4  Ankle plantarflexion    Ankle inversion    Ankle eversion     (Blank rows = not tested)  LUMBAR SPECIAL TESTS:  FABER test: Negative  FUNCTIONAL TESTS:  5 times sit to stand: TBD  GAIT: Distance walked: in  clinic Assistive device utilized: None Level of assistance: Modified independence Comments: Pt. Ambulates with consistent recip. Gait pattern with no assistive devices.  Pt. Has  rounded shoulder posture with limited arm swing.  Good heel strike/ clearance.    TREATMENT DATE:  02/04/24               Subjective:  Pt. Reports 3/10 back symptoms this morning.  Pt. Reports no new complaints and has remained active with daily activity.  Pt. Santina out to last Friday night and danced to 3 songs which resulted in 48 hours of rest/recovery to decrease symptoms.   Pt. Has added Meloxicam back to medication management of inflammation with benefits reported.  Discussed pt. Getting caregiver at home after spinal surgery.    There.ex.:  Nustep L4 for 10 minutes B UE/LE to improve strength and cardiorespiratory endurance.  Discussed back pain.    Resisted gait in //-bars: 6x all 4-planes.  Limited UE assist on //-bars and cuing to increase step length/ maintain core activation.    Nautilus:  Standing scap. Retraction 30# at Nautilus 20x2/ standing lat. Pull downs 30# 20x2 each.    Supine/ seated trunk rotn. 5x to L/R.    Supine GTB hip abduction/ dead bug x 20 each.  Supine SLR 10x2 on L/R with TrA activation.    Reviewed HEP  Manual:  Supine LE/lumbar stretches (generalized)- 11 min.  Focus on FABER/ hip IR/ ER/ proximal and distal hamstring/ lumbar rotn.     Seated STM to mid-thoracic/lumbar paraspinals with use of Hypervolt    PATIENT EDUCATION:  Education details: Aquatic ex./ stretches/ HEP Person educated: Patient Education method: Medical illustrator Education comprehension: verbalized understanding and returned demonstration  HOME EXERCISE PROGRAM: Discussed ex. With Sotero -static floor, cervical rotn., piriformis stretch, hip rotn., pelvic tilts, hip rotn. With bent knee, floor press (lying hip adductor stretch), hip abduction, shoulder rotn. In supine, overhead extension (bent legs).     ASSESSMENT:  CLINICAL IMPRESSION:   PT tx. session focused on core activation and BLE strengthening along with stretches and STM to lumbar paraspinals. Pt. tolerates  session well with no increase in pain.  No increase c/o tenderness during use of Hypervolt to lumbar paraspinals.  Good upright posture during resisted there.ex at [employer].  Pt. will benefit from skilled PT services to develop progressive HEP to increase generalized strength, esp. Core/lumbar musculature to improve pain-free functional mobility.      OBJECTIVE IMPAIRMENTS: Abnormal gait, decreased activity tolerance, decreased endurance, decreased mobility, difficulty walking, decreased ROM, decreased strength, impaired flexibility, improper body mechanics, postural dysfunction, and pain.   ACTIVITY LIMITATIONS: carrying, lifting, bending, standing, squatting, transfers, and locomotion level  PARTICIPATION LIMITATIONS: cleaning, laundry, driving, shopping, and community activity  PERSONAL FACTORS: Fitness and Past/current experiences are also affecting patient's functional outcome.   REHAB POTENTIAL: Good  CLINICAL DECISION MAKING: Evolving/moderate complexity  EVALUATION COMPLEXITY: Moderate   GOALS: Goals reviewed with patient? Yes  SHORT TERM GOALS: Target date: 01/06/24  Pt. Independent with HEP to progress LE muscle strength 1/2 muscle grade to improve pain-free mobility.   Baseline: Goal status: Partially met  LONG TERM GOALS: Target date: 03/02/24  Pt. Will decrease MODI to <40 to improve pain-free mobility.   Baseline: initial 64% self-perceived extreme disability.   Goal status: INITIAL  2.  Pt. Will increase lumbar AROM 25% to improve functional mobility/ household tasks.   Baseline: See above Goal status: INITIAL  3.  Pt. Will report <5/10 back pain at  worst with daily household tasks.   Baseline: 8/10 pain at worst Goal status: INITIAL  4.  Pt. Will increase B LE/core muscle strength to 4+/5 MMT to improve standing/walking tasks in preparation for spinal surgery.   Baseline: see above Goal status: INITIAL  PLAN:  PT FREQUENCY: 1x/week  PT DURATION: 12  weeks  PLANNED INTERVENTIONS: 97110-Therapeutic exercises, 97530- Therapeutic activity, [id number]- Neuromuscular re-education, 97535- Self Care, [id number]- Manual therapy, [phone]- Gait training, [phone]- Aquatic Therapy, Patient/Family education, Balance training, Stair training, Dry Needling, Joint mobilization, Cryotherapy, and Moist heat.  PLAN FOR NEXT SESSION: Progress HEP.  Ozell JAYSON Sero, PT, DPT # [id number] Physical Therapist - [facility]  02/04/2024, 10:55 AM

## 2024-02-13 LAB — GENECONNECT MOLECULAR SCREEN: Genetic Analysis Overall Interpretation: NEGATIVE

## 2024-02-17 ENCOUNTER — Encounter: Payer: Self-pay | Admitting: Physical Therapy

## 2024-02-17 ENCOUNTER — Ambulatory Visit: Attending: Spine Surgery | Admitting: Physical Therapy

## 2024-02-17 DIAGNOSIS — M5459 Other low back pain: Secondary | ICD-10-CM | POA: Insufficient documentation

## 2024-02-17 DIAGNOSIS — M2560 Stiffness of unspecified joint, not elsewhere classified: Secondary | ICD-10-CM | POA: Insufficient documentation

## 2024-02-17 DIAGNOSIS — M25511 Pain in right shoulder: Secondary | ICD-10-CM | POA: Diagnosis not present

## 2024-02-17 DIAGNOSIS — S42201D Unspecified fracture of upper end of right humerus, subsequent encounter for fracture with routine healing: Secondary | ICD-10-CM | POA: Diagnosis not present

## 2024-02-17 DIAGNOSIS — M6281 Muscle weakness (generalized): Secondary | ICD-10-CM | POA: Insufficient documentation

## 2024-02-17 DIAGNOSIS — M4126 Other idiopathic scoliosis, lumbar region: Secondary | ICD-10-CM | POA: Insufficient documentation

## 2024-02-17 NOTE — Therapy (Addendum)
 OUTPATIENT PHYSICAL THERAPY THORACOLUMBAR TREATMENT  Patient Name: Jennifer Morrow MRN: [id number] DOB:[birth year], 73 y.o., female Today's Date: 02/17/2024  END OF SESSION:  PT End of Session - 02/17/24 0730     Visit Number 7    Number of Visits 12    Date for PT Re-Evaluation 03/02/24    PT Start Time 0730    PT Stop Time 0817    PT Time Calculation (min) 47 min    Activity Tolerance Patient tolerated treatment well    Behavior During Therapy [facility] for tasks assessed/performed         Past Medical History:  Diagnosis Date   Allergy 2014   seasonal   Arthritis    Bronchitis    CHRONIC   Bulging lumbar disc    degenerative dics   Cataract    corrected with surgery   Scoliosis    LIMITS LUNG FUNCTION   Spinal stenosis    Past Surgical History:  Procedure Laterality Date   CATARACT EXTRACTION W/PHACO Left 01/06/2018   Procedure: CATARACT EXTRACTION PHACO AND INTRAOCULAR LENS PLACEMENT (IOC);  Surgeon: Myrna Adine Anes, MD;  Location: [facility] ORS;  Service: Ophthalmology;  Laterality: Left;  US  00:27.5 AP% 4.3 CDE 1.18 FLUID PACK LOT # [id number]   CATARACT EXTRACTION W/PHACO Right 02/10/2018   Procedure: CATARACT EXTRACTION PHACO AND INTRAOCULAR LENS PLACEMENT (IOC);  Surgeon: Myrna Adine Anes, MD;  Location: [facility] ORS;  Service: Ophthalmology;  Laterality: Right;  US  00:30.5 AP% 5.0 CDE 1.51  Fluid pack lot # [id number]   EYE SURGERY  2019   cataract   FOOT X 2     HEEL SPUR EXCISION     TONSILLECTOMY     Patient Active Problem List   Diagnosis Date Noted   Elevated liver enzymes 06/14/2023   Osteoarthritis cervical spine 01/14/2022   Age-related osteoporosis without current pathological fracture 05/19/2021   Spondylosis of lumbar region without myelopathy or radiculopathy 05/19/2021   Degenerative disc disease, lumbar 05/19/2021   Scoliosis, unspecified 01/09/2015   Vitamin D  deficiency 01/08/2014   Mixed hyperlipidemia 01/08/2014   PCP: Justus Doffing,  MD  REFERRING PROVIDER: Emi Bills, MD  REFERRING DIAG: M41.9 (ICD-10-CM) - Scoliosis, unspecified   Rationale for Evaluation and Treatment: Rehabilitation  THERAPY DIAG:  Other idiopathic scoliosis, lumbar region  Joint stiffness of spine  Muscle weakness (generalized)  Acute pain of right shoulder  ONSET DATE: chronic          SUBJECTIVE:  SUBJECTIVE STATEMENT: Pt. Reports chronic low back pain everyday.  Pt. Uses MH and hot tub daily to manage symptoms.  Increase pain with prolonged standing/ driving and activity during the day.  Pt. Known well to PT clinic and discharged from PT earlier this year.  Pt. Is scheduled for major back surgery with Dr. Emi for spinal fusion after summer.   Pt. Reports L LE/calf/foot N/T and weakness (no falls reported).  Pt. Wants to return to walking with trekking poles and no back symptoms.  Pt. Returned to exercise with [employer] (personal trainer) 5 weeks ago and 1st session was difficult/ resulted in several days of soreness. Pt. Scheduled for [employer]ero ex. On Tuesday.  Pt. Having a pulmonary functional test next week for lung clearance for back surgery.   PERTINENT HISTORY:  Pt. Well known to PT clinic.    PAIN:  Are you having pain? Yes: NPRS scale: 3/10 Pain location: low back Pain description: aching/ constant numbness in L LE/ calf/ bottom of foot.  R hip to R foot pain will result in giving out with walking.  Aggravating factors: increase activity/ prolonged standing and walking Relieving factors: heat (hot tub use)  PRECAUTIONS: Back/ scoliosis  RED FLAGS: None   WEIGHT BEARING RESTRICTIONS: No  FALLS:  Has patient fallen in last 6 months? No  LIVING ENVIRONMENT: Lives with: lives alone Lives in: House/apartment Has following equipment at home:  Single point cane  OCCUPATION: Retired  PLOF: Independent  PATIENT GOALS: Increase activity during the day with less back pain.    NEXT MD VISIT: PRN  OBJECTIVE:  Note: Objective measures were completed at Evaluation unless otherwise noted.  DIAGNOSTIC FINDINGS:  See imaging  PATIENT SURVEYS:  MODI: 64% self-perceived extreme disability.    COGNITION: Overall cognitive status: Within functional limits for tasks assessed     SENSATION: L LE to foot N/T  MUSCLE LENGTH: Hamstrings: Right 26 deg; Left 70 deg  POSTURE: rounded shoulders and forward head.  Moderate scoliosis (L to R).    PALPATION: (+) tenderness in R low thoracic/lumbar spine (hypomobile).    LUMBAR ROM:   AROM eval  Flexion 60 deg.  Extension 12 deg.  Right lateral flexion 50% limited  Left lateral flexion 50% limited  Right rotation 50% limited  Left rotation 50% limited   (Blank rows = not tested)  LOWER EXTREMITY ROM:     Active  Right eval Left eval  Hip flexion 115 deg. 112 deg.  Hip extension    Hip abduction [facility]  Hip adduction    Hip internal rotation 18 deg. 28 deg.  Hip external rotation    Knee flexion 136 deg. 132 deg.   Knee extension 0 deg. 0 deg.   Ankle dorsiflexion    Ankle plantarflexion    Ankle inversion    Ankle eversion     (Blank rows = not tested)  LOWER EXTREMITY MMT:    MMT Right eval Left eval  Hip flexion 4 4  Hip extension    Hip abduction 4 4  Hip adduction    Hip internal rotation 3+ 3+  Hip external rotation 3+ 3+  Knee flexion 4+ 4+  Knee extension 4 5  Ankle dorsiflexion 4 4  Ankle plantarflexion    Ankle inversion    Ankle eversion     (Blank rows = not tested)  LUMBAR SPECIAL TESTS:  FABER test: Negative  FUNCTIONAL TESTS:  5 times sit to stand: TBD  GAIT: Distance walked: in  clinic Assistive device utilized: None Level of assistance: Modified independence Comments: Pt. Ambulates with consistent recip. Gait pattern with no  assistive devices.  Pt. Has rounded shoulder posture with limited arm swing.  Good heel strike/ clearance.    TREATMENT DATE:  02/17/24               Subjective:  Pt. Has been busy with family/ beach trip since last tx. Session.  Pt. Reports marked increase in back pain during trip to beach requiring several days of recovery.  Pt. Reports her hot tub is not functioning right now and she misses the warmth to her back.    There.ex.:  Nustep L4 for 10 minutes B UE/LE to improve strength and cardiorespiratory endurance.  Discussed back pain.    Resisted gait 6x all 4-planes with light UE assist.    Nautilus: Standing scap. Retraction 30# at Nautilus/ standing lat. Pull downs 30# 15x2 each  Reviewed HEP  Manual:  Supine LE/lumbar stretches (generalized)- 11 min.  Focus on FABER/ hip IR/ ER/ proximal and distal hamstring/ lumbar rotn.     Seated STM to mid-thoracic/lumbar paraspinals with use of Hypervolt    PATIENT EDUCATION:  Education details: Aquatic ex./ stretches/ HEP Person educated: Patient Education method: Medical illustrator Education comprehension: verbalized understanding and returned demonstration  HOME EXERCISE PROGRAM: Discussed ex. With Sotero -static floor, cervical rotn., piriformis stretch, hip rotn., pelvic tilts, hip rotn. With bent knee, floor press (lying hip adductor stretch), hip abduction, shoulder rotn. In supine, overhead extension (bent legs).     ASSESSMENT:  CLINICAL IMPRESSION:   PT tx. session focused on core activation and BLE strengthening along with stretches and STM to lumbar paraspinals. Pt. tolerates session well with no increase in pain.  No increase c/o tenderness during use of Hypervolt to lumbar paraspinals.  Good balance/posture during resisted gait but extra time/focus during forward/backwards walking.  Pt. will benefit from skilled PT services to develop progressive HEP to increase generalized strength, esp. Core/lumbar musculature  to improve pain-free functional mobility.      OBJECTIVE IMPAIRMENTS: Abnormal gait, decreased activity tolerance, decreased endurance, decreased mobility, difficulty walking, decreased ROM, decreased strength, impaired flexibility, improper body mechanics, postural dysfunction, and pain.   ACTIVITY LIMITATIONS: carrying, lifting, bending, standing, squatting, transfers, and locomotion level  PARTICIPATION LIMITATIONS: cleaning, laundry, driving, shopping, and community activity  PERSONAL FACTORS: Fitness and Past/current experiences are also affecting patient's functional outcome.   REHAB POTENTIAL: Good  CLINICAL DECISION MAKING: Evolving/moderate complexity  EVALUATION COMPLEXITY: Moderate   GOALS: Goals reviewed with patient? Yes  SHORT TERM GOALS: Target date: 01/06/24  Pt. Independent with HEP to progress LE muscle strength 1/2 muscle grade to improve pain-free mobility.   Baseline: Goal status: Partially met  LONG TERM GOALS: Target date: 03/02/24  Pt. Will decrease MODI to <40 to improve pain-free mobility.   Baseline: initial 64% self-perceived extreme disability.   Goal status: INITIAL  2.  Pt. Will increase lumbar AROM 25% to improve functional mobility/ household tasks.   Baseline: See above Goal status: INITIAL  3.  Pt. Will report <5/10 back pain at worst with daily household tasks.   Baseline: 8/10 pain at worst Goal status: INITIAL  4.  Pt. Will increase B LE/core muscle strength to 4+/5 MMT to improve standing/walking tasks in preparation for spinal surgery.   Baseline: see above Goal status: INITIAL  PLAN:  PT FREQUENCY: 1x/week  PT DURATION: 12 weeks  PLANNED INTERVENTIONS: 97110-Therapeutic exercises, 97530- Therapeutic activity, 97112-  Neuromuscular re-education, [phone]- Self Care, 02859- Manual therapy, [phone]- Gait training, [phone]- Aquatic Therapy, Patient/Family education, Balance training, Stair training, Dry Needling, Joint mobilization,  Cryotherapy, and Moist heat.  PLAN FOR NEXT SESSION:  RECERT next tx.    Ozell JAYSON Sero, PT, DPT # [id number] Physical Therapist - [facility]  02/17/2024, 1:28 PM

## 2024-02-17 NOTE — Therapy (Incomplete Revision)
 OUTPATIENT PHYSICAL THERAPY THORACOLUMBAR TREATMENT  Patient Name: Jennifer Morrow MRN: [id number] DOB:1951/04/15, 73 y.o., female Today's Date: 02/17/2024  END OF SESSION:  PT End of Session - 02/17/24 0730     Visit Number 7    Number of Visits 12    Date for PT Re-Evaluation 03/02/24    PT Start Time 0730    PT Stop Time 0817    PT Time Calculation (min) 47 min    Activity Tolerance Patient tolerated treatment well    Behavior During Therapy [facility] for tasks assessed/performed         Past Medical History:  Diagnosis Date   Allergy 2014   seasonal   Arthritis    Bronchitis    CHRONIC   Bulging lumbar disc    degenerative dics   Cataract    corrected with surgery   Scoliosis    LIMITS LUNG FUNCTION   Spinal stenosis    Past Surgical History:  Procedure Laterality Date   CATARACT EXTRACTION W/PHACO Left 01/06/2018   Procedure: CATARACT EXTRACTION PHACO AND INTRAOCULAR LENS PLACEMENT (IOC);  Surgeon: Myrna Adine Anes, MD;  Location: [facility] ORS;  Service: Ophthalmology;  Laterality: Left;  US  00:27.5 AP% 4.3 CDE 1.18 FLUID PACK LOT # [id number]   CATARACT EXTRACTION W/PHACO Right 02/10/2018   Procedure: CATARACT EXTRACTION PHACO AND INTRAOCULAR LENS PLACEMENT (IOC);  Surgeon: Myrna Adine Anes, MD;  Location: [facility] ORS;  Service: Ophthalmology;  Laterality: Right;  US  00:30.5 AP% 5.0 CDE 1.51  Fluid pack lot # [id number] H   EYE SURGERY  2019   cataract   FOOT X 2     HEEL SPUR EXCISION     TONSILLECTOMY     Patient Active Problem List   Diagnosis Date Noted   Elevated liver enzymes 06/14/2023   Osteoarthritis cervical spine 01/14/2022   Age-related osteoporosis without current pathological fracture 05/19/2021   Spondylosis of lumbar region without myelopathy or radiculopathy 05/19/2021   Degenerative disc disease, lumbar 05/19/2021   Scoliosis, unspecified 01/09/2015   Vitamin D  deficiency 01/08/2014   Mixed hyperlipidemia 01/08/2014   PCP: Justus Doffing,  MD  REFERRING PROVIDER: Emi Bills, MD  REFERRING DIAG: M41.9 (ICD-10-CM) - Scoliosis, unspecified   Rationale for Evaluation and Treatment: Rehabilitation  THERAPY DIAG:  Other idiopathic scoliosis, lumbar region  Joint stiffness of spine  Muscle weakness (generalized)  Acute pain of right shoulder  ONSET DATE: chronic          SUBJECTIVE:  SUBJECTIVE STATEMENT: Pt. Reports chronic low back pain everyday.  Pt. Uses MH and hot tub daily to manage symptoms.  Increase pain with prolonged standing/ driving and activity during the day.  Pt. Known well to PT clinic and discharged from PT earlier this year.  Pt. Is scheduled for major back surgery with Dr. Emi for spinal fusion after summer.   Pt. Reports L LE/calf/foot N/T and weakness (no falls reported).  Pt. Wants to return to walking with trekking poles and no back symptoms.  Pt. Returned to exercise with [employer] (personal trainer) 5 weeks ago and 1st session was difficult/ resulted in several days of soreness. Pt. Scheduled for [employer] ex. On Tuesday.  Pt. Having a pulmonary functional test next week for lung clearance for back surgery.   PERTINENT HISTORY:  Pt. Well known to PT clinic.    PAIN:  Are you having pain? Yes: NPRS scale: 3/10 Pain location: low back Pain description: aching/ constant numbness in L LE/ calf/ bottom of foot.  R hip to R foot pain will result in giving out with walking.  Aggravating factors: increase activity/ prolonged standing and walking Relieving factors: heat (hot tub use)  PRECAUTIONS: Back/ scoliosis  RED FLAGS: None   WEIGHT BEARING RESTRICTIONS: No  FALLS:  Has patient fallen in last 6 months? No  LIVING ENVIRONMENT: Lives with: lives alone Lives in: House/apartment Has following equipment at home:  Single point cane  OCCUPATION: Retired  PLOF: Independent  PATIENT GOALS: Increase activity during the day with less back pain.    NEXT MD VISIT: PRN  OBJECTIVE:  Note: Objective measures were completed at Evaluation unless otherwise noted.  DIAGNOSTIC FINDINGS:  See imaging  PATIENT SURVEYS:  MODI: 64% self-perceived extreme disability.    COGNITION: Overall cognitive status: Within functional limits for tasks assessed     SENSATION: L LE to foot N/T  MUSCLE LENGTH: Hamstrings: Right 26 deg; Left 70 deg  POSTURE: rounded shoulders and forward head.  Moderate scoliosis (L to R).    PALPATION: (+) tenderness in R low thoracic/lumbar spine (hypomobile).    LUMBAR ROM:   AROM eval  Flexion 60 deg.  Extension 12 deg.  Right lateral flexion 50% limited  Left lateral flexion 50% limited  Right rotation 50% limited  Left rotation 50% limited   (Blank rows = not tested)  LOWER EXTREMITY ROM:     Active  Right eval Left eval  Hip flexion 115 deg. 112 deg.  Hip extension    Hip abduction [facility]  Hip adduction    Hip internal rotation 18 deg. 28 deg.  Hip external rotation    Knee flexion 136 deg. 132 deg.   Knee extension 0 deg. 0 deg.   Ankle dorsiflexion    Ankle plantarflexion    Ankle inversion    Ankle eversion     (Blank rows = not tested)  LOWER EXTREMITY MMT:    MMT Right eval Left eval  Hip flexion 4 4  Hip extension    Hip abduction 4 4  Hip adduction    Hip internal rotation 3+ 3+  Hip external rotation 3+ 3+  Knee flexion 4+ 4+  Knee extension 4 5  Ankle dorsiflexion 4 4  Ankle plantarflexion    Ankle inversion    Ankle eversion     (Blank rows = not tested)  LUMBAR SPECIAL TESTS:  FABER test: Negative  FUNCTIONAL TESTS:  5 times sit to stand: TBD  GAIT: Distance walked: in  clinic Assistive device utilized: None Level of assistance: Modified independence Comments: Pt. Ambulates with consistent recip. Gait pattern with no  assistive devices.  Pt. Has rounded shoulder posture with limited arm swing.  Good heel strike/ clearance.    TREATMENT DATE:  02/17/24               Subjective:  Pt. Has been busy with family/ beach trip since last tx. Session.  Pt. Reports marked increase in back pain during trip to beach requiring several days of recovery.  Pt. Reports her hot tub is not functioning right now and she misses the warmth to her back.    There.ex.:  Nustep L4 for 10 minutes B UE/LE to improve strength and cardiorespiratory endurance.  Discussed back pain.    Resisted gait 6x all 4-planes with light UE assist.    Nautilus: Rotation L/R 10# 10x/ Palloff press 30# L/R 2x10 each.   Standing scap. Retraction 30# at Nautilus/ standing lat. Pull downs 30# at Nautilus 20x each.    Reviewed HEP  Manual:  Supine LE/lumbar stretches (generalized)- 17 min.  Focus on FABER/ hip IR/ ER/ proximal and distal hamstring/ lumbar rotn.     Seated STM to mid-thoracic/lumbar paraspinals with use of Hypervolt    PATIENT EDUCATION:  Education details: Aquatic ex./ stretches/ HEP Person educated: Patient Education method: Medical illustrator Education comprehension: verbalized understanding and returned demonstration  HOME EXERCISE PROGRAM: Discussed ex. With [employer]otero -static floor, cervical rotn., piriformis stretch, hip rotn., pelvic tilts, hip rotn. With bent knee, floor press (lying hip adductor stretch), hip abduction, shoulder rotn. In supine, overhead extension (bent legs).     ASSESSMENT:  CLINICAL IMPRESSION:   PT tx. session focused on core activation and BLE strengthening along with stretches and STM to lumbar paraspinals. Pt. tolerates session well with no increase in pain.  No increase c/o tenderness during use of Hypervolt to lumbar paraspinals.  Pt. Cautious during with resisted Nautilus ex., esp. Rotn tasks.  Pt. will benefit from skilled PT services to develop progressive HEP to increase generalized  strength, esp. Core/lumbar musculature to improve pain-free functional mobility.      OBJECTIVE IMPAIRMENTS: Abnormal gait, decreased activity tolerance, decreased endurance, decreased mobility, difficulty walking, decreased ROM, decreased strength, impaired flexibility, improper body mechanics, postural dysfunction, and pain.   ACTIVITY LIMITATIONS: carrying, lifting, bending, standing, squatting, transfers, and locomotion level  PARTICIPATION LIMITATIONS: cleaning, laundry, driving, shopping, and community activity  PERSONAL FACTORS: Fitness and Past/current experiences are also affecting patient's functional outcome.   REHAB POTENTIAL: Good  CLINICAL DECISION MAKING: Evolving/moderate complexity  EVALUATION COMPLEXITY: Moderate   GOALS: Goals reviewed with patient? Yes  SHORT TERM GOALS: Target date: 01/06/24  Pt. Independent with HEP to progress LE muscle strength 1/2 muscle grade to improve pain-free mobility.   Baseline: Goal status: Partially met  LONG TERM GOALS: Target date: 03/02/24  Pt. Will decrease MODI to <40 to improve pain-free mobility.   Baseline: initial 64% self-perceived extreme disability.   Goal status: INITIAL  2.  Pt. Will increase lumbar AROM 25% to improve functional mobility/ household tasks.   Baseline: See above Goal status: INITIAL  3.  Pt. Will report <5/10 back pain at worst with daily household tasks.   Baseline: 8/10 pain at worst Goal status: INITIAL  4.  Pt. Will increase B LE/core muscle strength to 4+/5 MMT to improve standing/walking tasks in preparation for spinal surgery.   Baseline: see above Goal status: INITIAL  PLAN:  PT FREQUENCY:  1x/week  PT DURATION: 12 weeks  PLANNED INTERVENTIONS: 97110-Therapeutic exercises, 97530- Therapeutic activity, [id number]- Neuromuscular re-education, 97535- Self Care, [id number]- Manual therapy, [phone]- Gait training, [phone]- Aquatic Therapy, Patient/Family education, Balance training, Stair training,  Dry Needling, Joint mobilization, Cryotherapy, and Moist heat.  PLAN FOR NEXT SESSION:  RECERT next tx.    Ozell JAYSON Sero, PT, DPT # [phone] Physical Therapist - [facility]  02/17/2024, 1:28 PM

## 2024-02-24 ENCOUNTER — Ambulatory Visit: Admitting: Physical Therapy

## 2024-02-24 ENCOUNTER — Encounter: Payer: Self-pay | Admitting: Physical Therapy

## 2024-02-24 DIAGNOSIS — M2560 Stiffness of unspecified joint, not elsewhere classified: Secondary | ICD-10-CM

## 2024-02-24 DIAGNOSIS — M5459 Other low back pain: Secondary | ICD-10-CM | POA: Diagnosis not present

## 2024-02-24 DIAGNOSIS — M25511 Pain in right shoulder: Secondary | ICD-10-CM | POA: Diagnosis not present

## 2024-02-24 DIAGNOSIS — M6281 Muscle weakness (generalized): Secondary | ICD-10-CM | POA: Diagnosis not present

## 2024-02-24 DIAGNOSIS — M4126 Other idiopathic scoliosis, lumbar region: Secondary | ICD-10-CM

## 2024-02-24 DIAGNOSIS — S42201D Unspecified fracture of upper end of right humerus, subsequent encounter for fracture with routine healing: Secondary | ICD-10-CM | POA: Diagnosis not present

## 2024-02-24 NOTE — Therapy (Signed)
 OUTPATIENT PHYSICAL THERAPY THORACOLUMBAR TREATMENT/ RECERTIFICATION  Patient Name: Jennifer Morrow MRN: [id number] DOB:06-11-1951, 73 y.o., female Today's Date: 02/24/2024  END OF SESSION:  PT End of Session - 02/24/24 0729     Visit Number 8    Number of Visits 16    Date for PT Re-Evaluation 04/20/24    PT Start Time 0728    PT Stop Time 0816    PT Time Calculation (min) 48 min    Activity Tolerance Patient tolerated treatment well    Behavior During Therapy [facility] for tasks assessed/performed         Past Medical History:  Diagnosis Date   Allergy 2014   seasonal   Arthritis    Bronchitis    CHRONIC   Bulging lumbar disc    degenerative dics   Cataract    corrected with surgery   Scoliosis    LIMITS LUNG FUNCTION   Spinal stenosis    Past Surgical History:  Procedure Laterality Date   CATARACT EXTRACTION W/PHACO Left 01/06/2018   Procedure: CATARACT EXTRACTION PHACO AND INTRAOCULAR LENS PLACEMENT (IOC);  Surgeon: Myrna Adine Anes, MD;  Location: [facility] ORS;  Service: Ophthalmology;  Laterality: Left;  US  00:27.5 AP% 4.3 CDE 1.18 FLUID PACK LOT # [id number]   CATARACT EXTRACTION W/PHACO Right 02/10/2018   Procedure: CATARACT EXTRACTION PHACO AND INTRAOCULAR LENS PLACEMENT (IOC);  Surgeon: Myrna Adine Anes, MD;  Location: [facility] ORS;  Service: Ophthalmology;  Laterality: Right;  US  00:30.5 AP% 5.0 CDE 1.51  Fluid pack lot # [id number]   EYE SURGERY  2019   cataract   FOOT X 2     HEEL SPUR EXCISION     TONSILLECTOMY     Patient Active Problem List   Diagnosis Date Noted   Elevated liver enzymes 06/14/2023   Osteoarthritis cervical spine 01/14/2022   Age-related osteoporosis without current pathological fracture 05/19/2021   Spondylosis of lumbar region without myelopathy or radiculopathy 05/19/2021   Degenerative disc disease, lumbar 05/19/2021   Scoliosis, unspecified 01/09/2015   Vitamin D  deficiency 01/08/2014   Mixed hyperlipidemia 01/08/2014   PCP:  Justus Doffing, MD  REFERRING PROVIDER: Emi Bills, MD  REFERRING DIAG: M41.9 (ICD-10-CM) - Scoliosis, unspecified   Rationale for Evaluation and Treatment: Rehabilitation  THERAPY DIAG:  Other idiopathic scoliosis, lumbar region  Joint stiffness of spine  Muscle weakness (generalized)  ONSET DATE: chronic          SUBJECTIVE:  SUBJECTIVE STATEMENT: Pt. Reports chronic low back pain everyday.  Pt. Uses MH and hot tub daily to manage symptoms.  Increase pain with prolonged standing/ driving and activity during the day.  Pt. Known well to PT clinic and discharged from PT earlier this year.  Pt. Is scheduled for major back surgery with Dr. Emi for spinal fusion after summer.   Pt. Reports L LE/calf/foot N/T and weakness (no falls reported).  Pt. Wants to return to walking with trekking poles and no back symptoms.  Pt. Returned to exercise with [employer] (personal trainer) 5 weeks ago and 1st session was difficult/ resulted in several days of soreness. Pt. Scheduled for Sotero ex. On Tuesday.  Pt. Having a pulmonary functional test next week for lung clearance for back surgery.   PERTINENT HISTORY:  Pt. Well known to PT clinic.    PAIN:  Are you having pain? Yes: NPRS scale: 3/10 Pain location: low back Pain description: aching/ constant numbness in L LE/ calf/ bottom of foot.  R hip to R foot pain will result in giving out with walking.  Aggravating factors: increase activity/ prolonged standing and walking Relieving factors: heat (hot tub use)  PRECAUTIONS: Back/ scoliosis  RED FLAGS: None   WEIGHT BEARING RESTRICTIONS: No  FALLS:  Has patient fallen in last 6 months? No  LIVING ENVIRONMENT: Lives with: lives alone Lives in: House/apartment Has following equipment at home: Single point  cane  OCCUPATION: Retired  PLOF: Independent  PATIENT GOALS: Increase activity during the day with less back pain.    NEXT MD VISIT: PRN  OBJECTIVE:  Note: Objective measures were completed at Evaluation unless otherwise noted.  DIAGNOSTIC FINDINGS:  See imaging  PATIENT SURVEYS:  MODI: 64% self-perceived extreme disability.    COGNITION: Overall cognitive status: Within functional limits for tasks assessed     SENSATION: L LE to foot N/T  MUSCLE LENGTH: Hamstrings: Right 26 deg; Left 70 deg  POSTURE: rounded shoulders and forward head.  Moderate scoliosis (L to R).    PALPATION: (+) tenderness in R low thoracic/lumbar spine (hypomobile).    LUMBAR ROM:   AROM eval  Flexion 60 deg.  Extension 12 deg.  Right lateral flexion 50% limited  Left lateral flexion 50% limited  Right rotation 50% limited  Left rotation 50% limited   (Blank rows = not tested)  LOWER EXTREMITY ROM:     Active  Right eval Left eval  Hip flexion 115 deg. 112 deg.  Hip extension    Hip abduction [facility]  Hip adduction    Hip internal rotation 18 deg. 28 deg.  Hip external rotation    Knee flexion 136 deg. 132 deg.   Knee extension 0 deg. 0 deg.   Ankle dorsiflexion    Ankle plantarflexion    Ankle inversion    Ankle eversion     (Blank rows = not tested)  LOWER EXTREMITY MMT:    MMT Right eval Left eval  Hip flexion 4 4  Hip extension    Hip abduction 4 4  Hip adduction    Hip internal rotation 3+ 3+  Hip external rotation 3+ 3+  Knee flexion 4+ 4+  Knee extension 4 5  Ankle dorsiflexion 4 4  Ankle plantarflexion    Ankle inversion    Ankle eversion     (Blank rows = not tested)  LUMBAR SPECIAL TESTS:  FABER test: Negative  FUNCTIONAL TESTS:  5 times sit to stand: TBD  GAIT: Distance walked: in  clinic Assistive device utilized: None Level of assistance: Modified independence Comments: Pt. Ambulates with consistent recip. Gait pattern with no assistive  devices.  Pt. Has rounded shoulder posture with limited arm swing.  Good heel strike/ clearance.    TREATMENT DATE:  02/24/24               Subjective:  Pt. Has been active with daily tasks and pts. family returns today for a couple days before heading back to [place] .  Pt. continues to exercise with [employer] on Tuesday with no recent issues reported.    There.ex.:   Nustep L5 for 10 minutes B UE/LE to improve strength and cardiorespiratory endurance.  Increase resistance today/ no increase c/o pain.    TG: squats 20x with added shoulder flexion with 6# blue ball.    Resisted gait in //-bars 10x all 4-planes with light UE assist.    Nautilus: Standing scap. Retraction 30# at Nautilus/ standing lat. Pull downs 40# 15x2 each.  Supine TrA ex. With white ball:  knee to chest/ bridging/ trunk rotn./ SLR/ dying bug 20x each.  Cuing to maintain core activation/ technique.    Reviewed HEP  Manual:  Supine LE/lumbar stretches (generalized)- 10 min.  Focus on FABER/ hip IR/ ER/ proximal and distal hamstring/ lumbar rotn.     Seated STM to mid-thoracic/lumbar paraspinals with use of Hypervolt    PATIENT EDUCATION:  Education details: Aquatic ex./ stretches/ HEP Person educated: Patient Education method: Medical illustrator Education comprehension: verbalized understanding and returned demonstration  HOME EXERCISE PROGRAM: Discussed ex. With Sotero -static floor, cervical rotn., piriformis stretch, hip rotn., pelvic tilts, hip rotn. With bent knee, floor press (lying hip adductor stretch), hip abduction, shoulder rotn. In supine, overhead extension (bent legs).     ASSESSMENT:  CLINICAL IMPRESSION:   PT tx. session focused on core activation and BLE strengthening along with stretches and STM to lumbar paraspinals. Pt. tolerates session well with no increase in pain.  No increase c/o tenderness during use of Hypervolt to lumbar paraspinals.  Good balance/posture during resisted gait  but extra time/focus during forward/backwards walking.  See updated goals.  Pt. will benefit from skilled PT services to develop progressive HEP to increase generalized strength, esp. Core/lumbar musculature to improve pain-free functional mobility.      OBJECTIVE IMPAIRMENTS: Abnormal gait, decreased activity tolerance, decreased endurance, decreased mobility, difficulty walking, decreased ROM, decreased strength, impaired flexibility, improper body mechanics, postural dysfunction, and pain.   ACTIVITY LIMITATIONS: carrying, lifting, bending, standing, squatting, transfers, and locomotion level  PARTICIPATION LIMITATIONS: cleaning, laundry, driving, shopping, and community activity  PERSONAL FACTORS: Fitness and Past/current experiences are also affecting patient's functional outcome.   REHAB POTENTIAL: Good  CLINICAL DECISION MAKING: Evolving/moderate complexity  EVALUATION COMPLEXITY: Moderate   GOALS: Goals reviewed with patient? Yes  LONG TERM GOALS: Target date: 04/20/24  Pt. Independent with HEP to progress LE muscle strength 1/2 muscle grade to improve pain-free mobility.   Baseline: Goal status: Partially met  2.  Pt. Will decrease MODI to <40 to improve pain-free mobility.   Baseline: initial 64% self-perceived extreme disability.   Goal status: On-going  3.  Pt. Will increase lumbar AROM 25% to improve functional mobility/ household tasks.   Baseline: See above Goal status: Not met  3.  Pt. Will report <5/10 back pain at worst with daily household tasks.   Baseline: 8/10 pain at worst.  7/10: varying levels of pain with daily tasks Goal status: Partially met  4.  Pt.  Will increase B LE/core muscle strength to 4+/5 MMT to improve standing/walking tasks in preparation for spinal surgery.   Baseline: see above Goal status: On-going  PLAN:  PT FREQUENCY: 1x/week  PT DURATION: 8 weeks  PLANNED INTERVENTIONS: 97110-Therapeutic exercises, 97530- Therapeutic activity,  [id number]- Neuromuscular re-education, 97535- Self Care, [id number]- Manual therapy, [phone]- Gait training, [phone]- Aquatic Therapy, Patient/Family education, Balance training, Stair training, Dry Needling, Joint mobilization, Cryotherapy, and Moist heat.  PLAN FOR NEXT SESSION:  Progress core stability    Jennifer Morrow, PT, DPT # [phone] Physical Therapist - [facility]  02/24/2024, 9:21 AM

## 2024-03-02 ENCOUNTER — Encounter: Admitting: Physical Therapy

## 2024-03-07 DIAGNOSIS — M4154 Other secondary scoliosis, thoracic region: Secondary | ICD-10-CM | POA: Diagnosis not present

## 2024-03-07 DIAGNOSIS — M419 Scoliosis, unspecified: Secondary | ICD-10-CM | POA: Diagnosis not present

## 2024-03-07 DIAGNOSIS — M4150 Other secondary scoliosis, site unspecified: Secondary | ICD-10-CM | POA: Diagnosis not present

## 2024-03-07 DIAGNOSIS — M4156 Other secondary scoliosis, lumbar region: Secondary | ICD-10-CM | POA: Diagnosis not present

## 2024-03-07 DIAGNOSIS — M4316 Spondylolisthesis, lumbar region: Secondary | ICD-10-CM | POA: Diagnosis not present

## 2024-03-09 ENCOUNTER — Ambulatory Visit: Admitting: Physical Therapy

## 2024-03-09 ENCOUNTER — Encounter: Payer: Self-pay | Admitting: Physical Therapy

## 2024-03-09 DIAGNOSIS — M4126 Other idiopathic scoliosis, lumbar region: Secondary | ICD-10-CM

## 2024-03-09 DIAGNOSIS — S42201D Unspecified fracture of upper end of right humerus, subsequent encounter for fracture with routine healing: Secondary | ICD-10-CM

## 2024-03-09 DIAGNOSIS — M6281 Muscle weakness (generalized): Secondary | ICD-10-CM | POA: Diagnosis not present

## 2024-03-09 DIAGNOSIS — M5459 Other low back pain: Secondary | ICD-10-CM | POA: Diagnosis not present

## 2024-03-09 DIAGNOSIS — M25511 Pain in right shoulder: Secondary | ICD-10-CM

## 2024-03-09 DIAGNOSIS — M2560 Stiffness of unspecified joint, not elsewhere classified: Secondary | ICD-10-CM

## 2024-03-09 NOTE — Therapy (Signed)
 OUTPATIENT PHYSICAL THERAPY THORACOLUMBAR TREATMENT  Patient Name: Jennifer Morrow MRN: [id number] DOB:[birth year], 73 y.o., female Today's Date: 03/09/2024  END OF SESSION:  PT End of Session - 03/09/24 0730     Visit Number 9    Number of Visits 16    Date for PT Re-Evaluation 04/20/24    PT Start Time 0730    PT Stop Time 0815    PT Time Calculation (min) 45 min    Activity Tolerance Patient tolerated treatment well    Behavior During Therapy [facility] for tasks assessed/performed          Past Medical History:  Diagnosis Date   Allergy 2014   seasonal   Arthritis    Bronchitis    CHRONIC   Bulging lumbar disc    degenerative dics   Cataract    corrected with surgery   Scoliosis    LIMITS LUNG FUNCTION   Spinal stenosis    Past Surgical History:  Procedure Laterality Date   CATARACT EXTRACTION W/PHACO Left 01/06/2018   Procedure: CATARACT EXTRACTION PHACO AND INTRAOCULAR LENS PLACEMENT (IOC);  Surgeon: Myrna Adine Anes, MD;  Location: [facility] ORS;  Service: Ophthalmology;  Laterality: Left;  US  00:27.5 AP% 4.3 CDE 1.18 FLUID PACK LOT # [id number]   CATARACT EXTRACTION W/PHACO Right 02/10/2018   Procedure: CATARACT EXTRACTION PHACO AND INTRAOCULAR LENS PLACEMENT (IOC);  Surgeon: Myrna Adine Anes, MD;  Location: [facility] ORS;  Service: Ophthalmology;  Laterality: Right;  US  00:30.5 AP% 5.0 CDE 1.51  Fluid pack lot # [id number] H   EYE SURGERY  2019   cataract   FOOT X 2     HEEL SPUR EXCISION     TONSILLECTOMY     Patient Active Problem List   Diagnosis Date Noted   Elevated liver enzymes 06/14/2023   Osteoarthritis cervical spine 01/14/2022   Age-related osteoporosis without current pathological fracture 05/19/2021   Spondylosis of lumbar region without myelopathy or radiculopathy 05/19/2021   Degenerative disc disease, lumbar 05/19/2021   Scoliosis, unspecified 01/09/2015   Vitamin D  deficiency 01/08/2014   Mixed hyperlipidemia 01/08/2014   PCP: Justus Doffing,  MD  REFERRING PROVIDER: Emi Bills, MD  REFERRING DIAG: M41.9 (ICD-10-CM) - Scoliosis, unspecified   Rationale for Evaluation and Treatment: Rehabilitation  THERAPY DIAG:  Other idiopathic scoliosis, lumbar region  Closed fracture of proximal end of right humerus with routine healing, unspecified fracture morphology, subsequent encounter  Acute pain of right shoulder  Joint stiffness of spine  Muscle weakness (generalized)  Other low back pain  ONSET DATE: chronic          SUBJECTIVE:  SUBJECTIVE STATEMENT: Pt. Reports chronic low back pain everyday.  Pt. Uses MH and hot tub daily to manage symptoms.  Increase pain with prolonged standing/ driving and activity during the day.  Pt. Known well to PT clinic and discharged from PT earlier this year.  Pt. Is scheduled for major back surgery with Dr. Emi for spinal fusion after summer.   Pt. Reports L LE/calf/foot N/T and weakness (no falls reported).  Pt. Wants to return to walking with trekking poles and no back symptoms.  Pt. Returned to exercise with [employer] (personal trainer) 5 weeks ago and 1st session was difficult/ resulted in several days of soreness. Pt. Scheduled for [employer] ex. On Tuesday.  Pt. Having a pulmonary functional test next week for lung clearance for back surgery.   PERTINENT HISTORY:  Pt. Well known to PT clinic.    PAIN:  Are you having pain? Yes: NPRS scale: 3/10 Pain location: low back Pain description: aching/ constant numbness in L LE/ calf/ bottom of foot.  R hip to R foot pain will result in giving out with walking.  Aggravating factors: increase activity/ prolonged standing and walking Relieving factors: heat (hot tub use)  PRECAUTIONS: Back/ scoliosis  RED FLAGS: None   WEIGHT BEARING RESTRICTIONS: No  FALLS:   Has patient fallen in last 6 months? No  LIVING ENVIRONMENT: Lives with: lives alone Lives in: House/apartment Has following equipment at home: Single point cane  OCCUPATION: Retired  PLOF: Independent  PATIENT GOALS: Increase activity during the day with less back pain.    NEXT MD VISIT: PRN  OBJECTIVE:  Note: Objective measures were completed at Evaluation unless otherwise noted.  DIAGNOSTIC FINDINGS:  See imaging  PATIENT SURVEYS:  MODI: 64% self-perceived extreme disability.    COGNITION: Overall cognitive status: Within functional limits for tasks assessed     SENSATION: L LE to foot N/T  MUSCLE LENGTH: Hamstrings: Right 26 deg; Left 70 deg  POSTURE: rounded shoulders and forward head.  Moderate scoliosis (L to R).    PALPATION: (+) tenderness in R low thoracic/lumbar spine (hypomobile).    LUMBAR ROM:   AROM eval  Flexion 60 deg.  Extension 12 deg.  Right lateral flexion 50% limited  Left lateral flexion 50% limited  Right rotation 50% limited  Left rotation 50% limited   (Blank rows = not tested)  LOWER EXTREMITY ROM:     Active  Right eval Left eval  Hip flexion 115 deg. 112 deg.  Hip extension    Hip abduction [facility]  Hip adduction    Hip internal rotation 18 deg. 28 deg.  Hip external rotation    Knee flexion 136 deg. 132 deg.   Knee extension 0 deg. 0 deg.   Ankle dorsiflexion    Ankle plantarflexion    Ankle inversion    Ankle eversion     (Blank rows = not tested)  LOWER EXTREMITY MMT:    MMT Right eval Left eval  Hip flexion 4 4  Hip extension    Hip abduction 4 4  Hip adduction    Hip internal rotation 3+ 3+  Hip external rotation 3+ 3+  Knee flexion 4+ 4+  Knee extension 4 5  Ankle dorsiflexion 4 4  Ankle plantarflexion    Ankle inversion    Ankle eversion     (Blank rows = not tested)  LUMBAR SPECIAL TESTS:  FABER test: Negative  FUNCTIONAL TESTS:  5 times sit to stand: TBD  GAIT: Distance walked: in  clinic Assistive device utilized: None Level of assistance: Modified independence Comments: Pt. Ambulates with consistent recip. Gait pattern with no assistive devices.  Pt. Has rounded shoulder posture with limited arm swing.  Good heel strike/ clearance.    TREATMENT DATE:  03/09/24               Subjective:  Pt.reports that she had a surprise oral surgery last week that left her in bed for a few days, which didn't give much opportunity to stress [her] back.  Pt reports 4/10 LBP yesterday, 0/10 LBP today.   There.ex.:   Nustep L5 for 10 minutes B UE/LE with moist hot pack on lower back to improve strength and cardiorespiratory endurance.  No increase c/o pain.    Supine TrA ex.:  5x TrA activation and hold with manual feedback 1x10 Overhead ball raise  2x12 Bridging within pain tolerable range  Supine toe taps attempted, d/c due to increased LBP 2x12 supine marches   Reviewed HEP  Neuro re-ed:  2x10 scap retractions with green TB with standing marches, SBA , verbal cues for TrA engagement to improve posture  2x12 Paloff presses with green TB to improve posture   Manual: Supine LE/lumbar stretches (generalized)- 8 min.  Focus on KTC/ hip IR/ ER/ distal hamstring.     Seated STM to mid-thoracic/lumbar paraspinals with use of Hypervolt, 4 min   PATIENT EDUCATION:  Education details: Aquatic ex./ stretches/ HEP Person educated: Patient Education method: Medical illustrator Education comprehension: verbalized understanding and returned demonstration  HOME EXERCISE PROGRAM: Discussed ex. With Sotero -static floor, cervical rotn., piriformis stretch, hip rotn., pelvic tilts, hip rotn. With bent knee, floor press (lying hip adductor stretch), hip abduction, shoulder rotn. In supine, overhead extension (bent legs).     ASSESSMENT:  CLINICAL IMPRESSION:   PT tx. session focused on core activation and postural improvement along with stretches and STM to lumbar  paraspinals. Pt. tolerates session well with no increase in pain.  No increase c/o tenderness during use of Hypervolt to lumbar paraspinals.  Pt required verbal cuing for TrA activation to improve balance with rows with marching.  Pt. will benefit from skilled PT services to develop progressive HEP to increase generalized strength, esp. Core/lumbar musculature to improve pain-free functional mobility.      OBJECTIVE IMPAIRMENTS: Abnormal gait, decreased activity tolerance, decreased endurance, decreased mobility, difficulty walking, decreased ROM, decreased strength, impaired flexibility, improper body mechanics, postural dysfunction, and pain.   ACTIVITY LIMITATIONS: carrying, lifting, bending, standing, squatting, transfers, and locomotion level  PARTICIPATION LIMITATIONS: cleaning, laundry, driving, shopping, and community activity  PERSONAL FACTORS: Fitness and Past/current experiences are also affecting patient's functional outcome.   REHAB POTENTIAL: Good  CLINICAL DECISION MAKING: Evolving/moderate complexity  EVALUATION COMPLEXITY: Moderate   GOALS: Goals reviewed with patient? Yes  LONG TERM GOALS: Target date: 04/20/24  Pt. Independent with HEP to progress LE muscle strength 1/2 muscle grade to improve pain-free mobility.   Baseline: Goal status: Partially met  2.  Pt. Will decrease MODI to <40 to improve pain-free mobility.   Baseline: initial 64% self-perceived extreme disability.   Goal status: On-going  3.  Pt. Will increase lumbar AROM 25% to improve functional mobility/ household tasks.   Baseline: See above Goal status: Not met  3.  Pt. Will report <5/10 back pain at worst with daily household tasks.   Baseline: 8/10 pain at worst.  7/10: varying levels of pain with daily tasks Goal status: Partially met  4.  Pt.  Will increase B LE/core muscle strength to 4+/5 MMT to improve standing/walking tasks in preparation for spinal surgery.   Baseline: see above Goal  status: On-going  PLAN:  PT FREQUENCY: 1x/week  PT DURATION: 8 weeks  PLANNED INTERVENTIONS: 97110-Therapeutic exercises, 97530- Therapeutic activity, [id number]- Neuromuscular re-education, 97535- Self Care, 02859- Manual therapy, [phone]- Gait training, [phone]- Aquatic Therapy, Patient/Family education, Balance training, Stair training, Dry Needling, Joint mobilization, Cryotherapy, and Moist heat.  PLAN FOR NEXT SESSION:  Progress core stability/ 10th visit progress note next tx. session  Ozell JAYSON Sero, PT, DPT # [phone] Cheron Last, SPT Physical Therapist - [facility]  03/09/2024, 8:16 AM

## 2024-03-16 ENCOUNTER — Ambulatory Visit: Admitting: Physical Therapy

## 2024-03-16 ENCOUNTER — Encounter: Payer: Self-pay | Admitting: Physical Therapy

## 2024-03-16 DIAGNOSIS — M5459 Other low back pain: Secondary | ICD-10-CM

## 2024-03-16 DIAGNOSIS — M6281 Muscle weakness (generalized): Secondary | ICD-10-CM

## 2024-03-16 DIAGNOSIS — S42201D Unspecified fracture of upper end of right humerus, subsequent encounter for fracture with routine healing: Secondary | ICD-10-CM | POA: Diagnosis not present

## 2024-03-16 DIAGNOSIS — M2560 Stiffness of unspecified joint, not elsewhere classified: Secondary | ICD-10-CM

## 2024-03-16 DIAGNOSIS — M4126 Other idiopathic scoliosis, lumbar region: Secondary | ICD-10-CM

## 2024-03-16 DIAGNOSIS — M25511 Pain in right shoulder: Secondary | ICD-10-CM | POA: Diagnosis not present

## 2024-03-16 NOTE — Therapy (Signed)
 OUTPATIENT PHYSICAL THERAPY THORACOLUMBAR TREATMENT Physical Therapy Progress Note  Dates of reporting period  12/09/23   to   03/16/24   Patient Name: Jennifer Morrow MRN: [id number] DOB:Mar 21, 1951, 73 y.o., female Today's Date: 03/16/2024  END OF SESSION:  PT End of Session - 03/16/24 0720     Visit Number 10    Number of Visits 16    Date for PT Re-Evaluation 04/20/24    PT Start Time 0732    PT Stop Time 0816    PT Time Calculation (min) 44 min    Activity Tolerance Patient tolerated treatment well    Behavior During Therapy [facility] for tasks assessed/performed          Past Medical History:  Diagnosis Date   Allergy 2014   seasonal   Arthritis    Bronchitis    CHRONIC   Bulging lumbar disc    degenerative dics   Cataract    corrected with surgery   Scoliosis    LIMITS LUNG FUNCTION   Spinal stenosis    Past Surgical History:  Procedure Laterality Date   CATARACT EXTRACTION W/PHACO Left 01/06/2018   Procedure: CATARACT EXTRACTION PHACO AND INTRAOCULAR LENS PLACEMENT (IOC);  Surgeon: Myrna Adine Anes, MD;  Location: [facility] ORS;  Service: Ophthalmology;  Laterality: Left;  US  00:27.5 AP% 4.3 CDE 1.18 FLUID PACK LOT # [id number]   CATARACT EXTRACTION W/PHACO Right 02/10/2018   Procedure: CATARACT EXTRACTION PHACO AND INTRAOCULAR LENS PLACEMENT (IOC);  Surgeon: Myrna Adine Anes, MD;  Location: [facility] ORS;  Service: Ophthalmology;  Laterality: Right;  US  00:30.5 AP% 5.0 CDE 1.51  Fluid pack lot # [id number]   EYE SURGERY  2019   cataract   FOOT X 2     HEEL SPUR EXCISION     TONSILLECTOMY     Patient Active Problem List   Diagnosis Date Noted   Elevated liver enzymes 06/14/2023   Osteoarthritis cervical spine 01/14/2022   Age-related osteoporosis without current pathological fracture 05/19/2021   Spondylosis of lumbar region without myelopathy or radiculopathy 05/19/2021   Degenerative disc disease, lumbar 05/19/2021   Scoliosis, unspecified 01/09/2015   Vitamin  D deficiency 01/08/2014   Mixed hyperlipidemia 01/08/2014   PCP: Justus Doffing, MD  REFERRING PROVIDER: Emi Bills, MD  REFERRING DIAG: M41.9 (ICD-10-CM) - Scoliosis, unspecified   Rationale for Evaluation and Treatment: Rehabilitation  THERAPY DIAG:  Other idiopathic scoliosis, lumbar region  Joint stiffness of spine  Muscle weakness (generalized)  Other low back pain  ONSET DATE: chronic          SUBJECTIVE:  SUBJECTIVE STATEMENT: Pt. Reports chronic low back pain everyday.  Pt. Uses MH and hot tub daily to manage symptoms.  Increase pain with prolonged standing/ driving and activity during the day.  Pt. Known well to PT clinic and discharged from PT earlier this year.  Pt. Is scheduled for major back surgery with Dr. Emi for spinal fusion after summer.   Pt. Reports L LE/calf/foot N/T and weakness (no falls reported).  Pt. Wants to return to walking with trekking poles and no back symptoms.  Pt. Returned to exercise with [employer] (personal trainer) 5 weeks ago and 1st session was difficult/ resulted in several days of soreness. Pt. Scheduled for [employer] ex. On Tuesday.  Pt. Having a pulmonary functional test next week for lung clearance for back surgery.   PERTINENT HISTORY:  Pt. Well known to PT clinic.    PAIN:  Are you having pain? Yes: NPRS scale: 3/10 Pain location: low back Pain description: aching/ constant numbness in L LE/ calf/ bottom of foot.  R hip to R foot pain will result in giving out with walking.  Aggravating factors: increase activity/ prolonged standing and walking Relieving factors: heat (hot tub use)  PRECAUTIONS: Back/ scoliosis  RED FLAGS: None   WEIGHT BEARING RESTRICTIONS: No  FALLS:  Has patient fallen in last 6 months? No  LIVING ENVIRONMENT: Lives with:  lives alone Lives in: House/apartment Has following equipment at home: Single point cane  OCCUPATION: Retired  PLOF: Independent  PATIENT GOALS: Increase activity during the day with less back pain.    NEXT MD VISIT: PRN  OBJECTIVE:  Note: Objective measures were completed at Evaluation unless otherwise noted.  DIAGNOSTIC FINDINGS:  See imaging  PATIENT SURVEYS:  MODI: 64% self-perceived extreme disability.    COGNITION: Overall cognitive status: Within functional limits for tasks assessed     SENSATION: L LE to foot N/T  MUSCLE LENGTH: Hamstrings: Right 26 deg; Left 70 deg  POSTURE: rounded shoulders and forward head.  Moderate scoliosis (L to R).    PALPATION: (+) tenderness in R low thoracic/lumbar spine (hypomobile).    LUMBAR ROM:   AROM eval  Flexion 60 deg.  Extension 12 deg.  Right lateral flexion 50% limited  Left lateral flexion 50% limited  Right rotation 50% limited  Left rotation 50% limited   (Blank rows = not tested)  LOWER EXTREMITY ROM:     Active  Right eval Left eval  Hip flexion 115 deg. 112 deg.  Hip extension    Hip abduction [facility]  Hip adduction    Hip internal rotation 18 deg. 28 deg.  Hip external rotation    Knee flexion 136 deg. 132 deg.   Knee extension 0 deg. 0 deg.   Ankle dorsiflexion    Ankle plantarflexion    Ankle inversion    Ankle eversion     (Blank rows = not tested)  LOWER EXTREMITY MMT:    MMT Right eval Left eval  Hip flexion 4 4  Hip extension    Hip abduction 4 4  Hip adduction    Hip internal rotation 3+ 3+  Hip external rotation 3+ 3+  Knee flexion 4+ 4+  Knee extension 4 5  Ankle dorsiflexion 4 4  Ankle plantarflexion    Ankle inversion    Ankle eversion     (Blank rows = not tested)  LUMBAR SPECIAL TESTS:  FABER test: Negative  FUNCTIONAL TESTS:  5 times sit to stand: TBD  GAIT: Distance walked: in  clinic Assistive device utilized: None Level of assistance: Modified  independence Comments: Pt. Ambulates with consistent recip. Gait pattern with no assistive devices.  Pt. Has rounded shoulder posture with limited arm swing.  Good heel strike/ clearance.    TREATMENT DATE:  03/16/24               Subjective:  Pt. reports 3/10 LBP currently.  Pt. Has had more pain this week and minimal sleep because pt. Has been busy.  Pt. Leaving for [facility] tomorrow and returns next week and then heads to [place]  for oldest sister's birthday party.    There.ex.:   Nustep L5 for 10 minutes B UE/LE with moist hot pack on lower back to improve strength and cardiorespiratory endurance.  No increase c/o pain.   Discussed upcoming travels/ back pain.     Scap retractions with green TB with standing marches, SBA , verbal cues for TrA engagement to improve posture (30x).  Standing shoulder extension GTB 30x (mirror feedback).  Standing horizontal flexion GTB 30x.  Moderate cuing for proper technique/ posture correction.    Paloff presses with green TB to improve posture 10x L/R.    Supine TrA ex.:  5x TrA activation and hold with manual feedback Marching 20x/ bicycles 20x.  5xSTS: 41.5 seconds.    MODI: 62%  Reviewed HEP  Manual: Supine LE/lumbar stretches (generalized)- 8 min.  Focus on KTC/ hip IR/ ER/ distal hamstring.     Seated STM to mid-thoracic/lumbar paraspinals with use of Hypervolt.   PATIENT EDUCATION:  Education details: Aquatic ex./ stretches/ HEP Person educated: Patient Education method: Medical illustrator Education comprehension: verbalized understanding and returned demonstration  HOME EXERCISE PROGRAM: Discussed ex. With Sotero -static floor, cervical rotn., piriformis stretch, hip rotn., pelvic tilts, hip rotn. With bent knee, floor press (lying hip adductor stretch), hip abduction, shoulder rotn. In supine, overhead extension (bent legs).     ASSESSMENT:  CLINICAL IMPRESSION:   PT tx. session focused on core activation and postural  improvement along with stretches and STM to lumbar paraspinals. Pt. tolerates session well with no increase in pain.  No increase c/o tenderness during use of Hypervolt to lumbar paraspinals.  Pt required verbal cuing for TrA activation to improve balance with rows with marching.  Pt. will benefit from skilled PT services to develop progressive HEP to increase generalized strength, esp. Core/lumbar musculature to improve pain-free functional mobility.      OBJECTIVE IMPAIRMENTS: Abnormal gait, decreased activity tolerance, decreased endurance, decreased mobility, difficulty walking, decreased ROM, decreased strength, impaired flexibility, improper body mechanics, postural dysfunction, and pain.   ACTIVITY LIMITATIONS: carrying, lifting, bending, standing, squatting, transfers, and locomotion level  PARTICIPATION LIMITATIONS: cleaning, laundry, driving, shopping, and community activity  PERSONAL FACTORS: Fitness and Past/current experiences are also affecting patient's functional outcome.   REHAB POTENTIAL: Good  CLINICAL DECISION MAKING: Evolving/moderate complexity  EVALUATION COMPLEXITY: Moderate   GOALS: Goals reviewed with patient? Yes  LONG TERM GOALS: Target date: 04/20/24  Pt. Independent with HEP to progress LE muscle strength 1/2 muscle grade to improve pain-free mobility.   Baseline: Goal status: Partially met  2.  Pt. Will decrease MODI to <40 to improve pain-free mobility.   Baseline: initial 64% self-perceived extreme disability.  7/31: 62%  Goal status: Not met  3.  Pt. Will increase lumbar AROM 25% to improve functional mobility/ household tasks.   Baseline: See above Goal status: Not met  3.  Pt. Will report <5/10 back pain at worst with daily  household tasks.   Baseline: 8/10 pain at worst.  7/10: varying levels of pain with daily tasks Goal status: Partially met  4.  Pt. Will increase B LE/core muscle strength to 4+/5 MMT to improve standing/walking tasks in  preparation for spinal surgery.   Baseline: see above Goal status: Not met  PLAN:  PT FREQUENCY: 1x/week  PT DURATION: 8 weeks  PLANNED INTERVENTIONS: 97110-Therapeutic exercises, 97530- Therapeutic activity, 97112- Neuromuscular re-education, 97535- Self Care, 02859- Manual therapy, [phone]- Gait training, [phone]- Aquatic Therapy, Patient/Family education, Balance training, Stair training, Dry Needling, Joint mobilization, Cryotherapy, and Moist heat.  PLAN FOR NEXT SESSION:  Progress core stability  Ozell JAYSON Sero, PT, DPT # [phone] Physical Therapist - [facility]  03/16/2024, 5:25 PM

## 2024-03-23 ENCOUNTER — Encounter: Admitting: Physical Therapy

## 2024-03-30 ENCOUNTER — Ambulatory Visit: Attending: Spine Surgery | Admitting: Physical Therapy

## 2024-03-30 DIAGNOSIS — M4126 Other idiopathic scoliosis, lumbar region: Secondary | ICD-10-CM | POA: Diagnosis not present

## 2024-03-30 DIAGNOSIS — M6281 Muscle weakness (generalized): Secondary | ICD-10-CM | POA: Diagnosis not present

## 2024-03-30 DIAGNOSIS — M5459 Other low back pain: Secondary | ICD-10-CM | POA: Insufficient documentation

## 2024-03-30 DIAGNOSIS — M2560 Stiffness of unspecified joint, not elsewhere classified: Secondary | ICD-10-CM | POA: Insufficient documentation

## 2024-03-30 NOTE — Therapy (Signed)
 OUTPATIENT PHYSICAL THERAPY THORACOLUMBAR TREATMENT  Patient Name: Jennifer Morrow MRN: [id number] DOB:[birth year], 73 y.o., female Today's Date: 03/30/2024  END OF SESSION:  PT End of Session - 03/30/24 0727     Visit Number 11    Number of Visits 16    Date for PT Re-Evaluation 04/20/24    PT Start Time 0727    PT Stop Time 0814    PT Time Calculation (min) 47 min    Activity Tolerance Patient tolerated treatment well    Behavior During Therapy [facility] for tasks assessed/performed          Past Medical History:  Diagnosis Date   Allergy 2014   seasonal   Arthritis    Bronchitis    CHRONIC   Bulging lumbar disc    degenerative dics   Cataract    corrected with surgery   Scoliosis    LIMITS LUNG FUNCTION   Spinal stenosis    Past Surgical History:  Procedure Laterality Date   CATARACT EXTRACTION W/PHACO Left 01/06/2018   Procedure: CATARACT EXTRACTION PHACO AND INTRAOCULAR LENS PLACEMENT (IOC);  Surgeon: Myrna Adine Anes, MD;  Location: [facility] ORS;  Service: Ophthalmology;  Laterality: Left;  US  00:27.5 AP% 4.3 CDE 1.18 FLUID PACK LOT # [id number]   CATARACT EXTRACTION W/PHACO Right 02/10/2018   Procedure: CATARACT EXTRACTION PHACO AND INTRAOCULAR LENS PLACEMENT (IOC);  Surgeon: Myrna Adine Anes, MD;  Location: [facility] ORS;  Service: Ophthalmology;  Laterality: Right;  US  00:30.5 AP% 5.0 CDE 1.51  Fluid pack lot # [id number] H   EYE SURGERY  2019   cataract   FOOT X 2     HEEL SPUR EXCISION     TONSILLECTOMY     Patient Active Problem List   Diagnosis Date Noted   Elevated liver enzymes 06/14/2023   Osteoarthritis cervical spine 01/14/2022   Age-related osteoporosis without current pathological fracture 05/19/2021   Spondylosis of lumbar region without myelopathy or radiculopathy 05/19/2021   Degenerative disc disease, lumbar 05/19/2021   Scoliosis, unspecified 01/09/2015   Vitamin D  deficiency 01/08/2014   Mixed hyperlipidemia 01/08/2014   PCP: Justus Doffing,  MD  REFERRING PROVIDER: Emi Bills, MD  REFERRING DIAG: M41.9 (ICD-10-CM) - Scoliosis, unspecified   Rationale for Evaluation and Treatment: Rehabilitation  THERAPY DIAG:  Muscle weakness (generalized)  Joint stiffness of spine  Other low back pain  Other idiopathic scoliosis, lumbar region  ONSET DATE: chronic          SUBJECTIVE:  SUBJECTIVE STATEMENT: Pt. Reports chronic low back pain everyday.  Pt. Uses MH and hot tub daily to manage symptoms.  Increase pain with prolonged standing/ driving and activity during the day.  Pt. Known well to PT clinic and discharged from PT earlier this year.  Pt. Is scheduled for major back surgery with Dr. Emi for spinal fusion after summer.   Pt. Reports L LE/calf/foot N/T and weakness (no falls reported).  Pt. Wants to return to walking with trekking poles and no back symptoms.  Pt. Returned to exercise with [employer] (personal trainer) 5 weeks ago and 1st session was difficult/ resulted in several days of soreness. Pt. Scheduled for [employer] ex. On Tuesday.  Pt. Having a pulmonary functional test next week for lung clearance for back surgery.   PERTINENT HISTORY:  Pt. Well known to PT clinic.    PAIN:  Are you having pain? Yes: NPRS scale: 3/10 Pain location: low back Pain description: aching/ constant numbness in L LE/ calf/ bottom of foot.  R hip to R foot pain will result in giving out with walking.  Aggravating factors: increase activity/ prolonged standing and walking Relieving factors: heat (hot tub use)  PRECAUTIONS: Back/ scoliosis  RED FLAGS: None   WEIGHT BEARING RESTRICTIONS: No  FALLS:  Has patient fallen in last 6 months? No  LIVING ENVIRONMENT: Lives with: lives alone Lives in: House/apartment Has following equipment at home: Single  point cane  OCCUPATION: Retired  PLOF: Independent  PATIENT GOALS: Increase activity during the day with less back pain.    NEXT MD VISIT: PRN  OBJECTIVE:  Note: Objective measures were completed at Evaluation unless otherwise noted.  DIAGNOSTIC FINDINGS:  See imaging  PATIENT SURVEYS:  MODI: 64% self-perceived extreme disability.    COGNITION: Overall cognitive status: Within functional limits for tasks assessed     SENSATION: L LE to foot N/T  MUSCLE LENGTH: Hamstrings: Right 26 deg; Left 70 deg  POSTURE: rounded shoulders and forward head.  Moderate scoliosis (L to R).    PALPATION: (+) tenderness in R low thoracic/lumbar spine (hypomobile).    LUMBAR ROM:   AROM eval  Flexion 60 deg.  Extension 12 deg.  Right lateral flexion 50% limited  Left lateral flexion 50% limited  Right rotation 50% limited  Left rotation 50% limited   (Blank rows = not tested)  LOWER EXTREMITY ROM:     Active  Right eval Left eval  Hip flexion 115 deg. 112 deg.  Hip extension    Hip abduction [facility]  Hip adduction    Hip internal rotation 18 deg. 28 deg.  Hip external rotation    Knee flexion 136 deg. 132 deg.   Knee extension 0 deg. 0 deg.   Ankle dorsiflexion    Ankle plantarflexion    Ankle inversion    Ankle eversion     (Blank rows = not tested)  LOWER EXTREMITY MMT:    MMT Right eval Left eval  Hip flexion 4 4  Hip extension    Hip abduction 4 4  Hip adduction    Hip internal rotation 3+ 3+  Hip external rotation 3+ 3+  Knee flexion 4+ 4+  Knee extension 4 5  Ankle dorsiflexion 4 4  Ankle plantarflexion    Ankle inversion    Ankle eversion     (Blank rows = not tested)  LUMBAR SPECIAL TESTS:  FABER test: Negative  FUNCTIONAL TESTS:  5 times sit to stand: TBD  GAIT: Distance walked: in  clinic Assistive device utilized: None Level of assistance: Modified independence Comments: Pt. Ambulates with consistent recip. Gait pattern with no  assistive devices.  Pt. Has rounded shoulder posture with limited arm swing.  Good heel strike/ clearance.    5xSTS: 41.5 seconds.    MODI: 62%  TREATMENT DATE:  03/30/24               Subjective:  Pt. reports 3/10 LBP currently. Pt. has an anesthesiologist appointment on Tuesday Aug. 19th to prepare for surgery in September.    There.ex.:   Nustep L5 for 10 minutes B UE/LE to improve strength and cardiorespiratory endurance.  No increase c/o pain.   Discussed current back pain and symptoms during recent trip.   Scap retractions with green TB, SBA , verbal cues for TrA engagement to improve posture (30x).  Standing shoulder extension GTB 30x.  Standing horizontal flexion GTB 30x (mirror feedback).  Moderate cuing for proper technique/ posture correction.    Paloff presses with green TB to improve posture 10x L/R.    Supine TrA ex.:  20x TrA activation and hold with manual feedback Marches 20x Deadbugs 20x    Manual: Supine LE/lumbar stretches (generalized)- 12 min.  Focus on KTC/ hip IR/ ER/ distal hamstring.     Seated STM to mid-thoracic/lumbar paraspinals with use of Hypervolt (tenderness to right side)   PATIENT EDUCATION:  Education details: Aquatic ex./ stretches/ HEP Person educated: Patient Education method: Medical illustrator Education comprehension: verbalized understanding and returned demonstration  HOME EXERCISE PROGRAM: Discussed ex. With [employer]otero -static floor, cervical rotn., piriformis stretch, hip rotn., pelvic tilts, hip rotn. With bent knee, floor press (lying hip adductor stretch), hip abduction, shoulder rotn. In supine, overhead extension (bent legs).     ASSESSMENT:  CLINICAL IMPRESSION:   PT tx. session focused on activation of core to improve low back and core strength and stability with manual stretches and STM to lumbar paraspinals. Pt. With fatigue during standing horizontal flexion exercise with green Therband secondary to current  deficits with endurance capacity of postural musculature in bilateral UEs. Mild tenderness present on right side of lumbar paraspinals during use of Hypervolt but was able to tolerate without an increase in pain.  Pt required tactile cuing for TrA activation during supine marching exercise. Pt. tolerated session well with no increase in pain at end of session. Will continue to monitor and progress as able.   OBJECTIVE IMPAIRMENTS: Abnormal gait, decreased activity tolerance, decreased endurance, decreased mobility, difficulty walking, decreased ROM, decreased strength, impaired flexibility, improper body mechanics, postural dysfunction, and pain.   ACTIVITY LIMITATIONS: carrying, lifting, bending, standing, squatting, transfers, and locomotion level  PARTICIPATION LIMITATIONS: cleaning, laundry, driving, shopping, and community activity  PERSONAL FACTORS: Fitness and Past/current experiences are also affecting patient's functional outcome.   REHAB POTENTIAL: Good  CLINICAL DECISION MAKING: Evolving/moderate complexity  EVALUATION COMPLEXITY: Moderate   GOALS: Goals reviewed with patient? Yes  LONG TERM GOALS: Target date: 04/20/24  Pt. Independent with HEP to progress LE muscle strength 1/2 muscle grade to improve pain-free mobility.   Baseline: Goal status: Partially met  2.  Pt. Will decrease MODI to <40 to improve pain-free mobility.   Baseline: initial 64% self-perceived extreme disability.  7/31: 62%  Goal status: Not met  3.  Pt. Will increase lumbar AROM 25% to improve functional mobility/ household tasks.   Baseline: See above Goal status: Not met  3.  Pt. Will report <5/10 back pain at worst with daily household tasks.  Baseline: 8/10 pain at worst.  7/10: varying levels of pain with daily tasks Goal status: Partially met  4.  Pt. Will increase B LE/core muscle strength to 4+/5 MMT to improve standing/walking tasks in preparation for spinal surgery.   Baseline: see  above Goal status: Not met  PLAN:  PT FREQUENCY: 1x/week  PT DURATION: 8 weeks  PLANNED INTERVENTIONS: 97110-Therapeutic exercises, 97530- Therapeutic activity, 97112- Neuromuscular re-education, 97535- Self Care, 02859- Manual therapy, [phone]- Gait training, [phone]- Aquatic Therapy, Patient/Family education, Balance training, Stair training, Dry Needling, Joint mobilization, Cryotherapy, and Moist heat.  PLAN FOR NEXT SESSION:  Introduce functional tasks such as step ups or reaching tasks with focus on keeping core activated throughout movements.   Curtistine Bracket, SPT  Ozell JAYSON Sero, PT, DPT # [phone] Physical Therapist - [facility] 03/30/2024, 9:27 AM

## 2024-04-03 DIAGNOSIS — M48061 Spinal stenosis, lumbar region without neurogenic claudication: Secondary | ICD-10-CM | POA: Insufficient documentation

## 2024-04-04 DIAGNOSIS — Z01818 Encounter for other preprocedural examination: Secondary | ICD-10-CM | POA: Diagnosis not present

## 2024-04-04 DIAGNOSIS — M4146 Neuromuscular scoliosis, lumbar region: Secondary | ICD-10-CM | POA: Diagnosis not present

## 2024-04-04 DIAGNOSIS — M48061 Spinal stenosis, lumbar region without neurogenic claudication: Secondary | ICD-10-CM | POA: Diagnosis not present

## 2024-04-06 ENCOUNTER — Ambulatory Visit: Admitting: Physical Therapy

## 2024-04-06 ENCOUNTER — Encounter: Payer: Self-pay | Admitting: Physical Therapy

## 2024-04-06 DIAGNOSIS — M2560 Stiffness of unspecified joint, not elsewhere classified: Secondary | ICD-10-CM | POA: Diagnosis not present

## 2024-04-06 DIAGNOSIS — M6281 Muscle weakness (generalized): Secondary | ICD-10-CM

## 2024-04-06 DIAGNOSIS — M5459 Other low back pain: Secondary | ICD-10-CM | POA: Diagnosis not present

## 2024-04-06 DIAGNOSIS — M4126 Other idiopathic scoliosis, lumbar region: Secondary | ICD-10-CM

## 2024-04-06 NOTE — Therapy (Signed)
 OUTPATIENT PHYSICAL THERAPY THORACOLUMBAR TREATMENT  Patient Name: PELAGIA Morrow MRN: [id number] DOB:[birth year], 73 y.o., female Today's Date: 04/06/2024  END OF SESSION:  PT End of Session - 04/06/24 0734     Visit Number 12    Number of Visits 16    Date for PT Re-Evaluation 04/20/24    PT Start Time 0734    PT Stop Time 0819    PT Time Calculation (min) 45 min    Activity Tolerance Patient tolerated treatment well    Behavior During Therapy [facility] for tasks assessed/performed          Past Medical History:  Diagnosis Date   Allergy 2014   seasonal   Arthritis    Bronchitis    CHRONIC   Bulging lumbar disc    degenerative dics   Cataract    corrected with surgery   Scoliosis    LIMITS LUNG FUNCTION   Spinal stenosis    Past Surgical History:  Procedure Laterality Date   CATARACT EXTRACTION W/PHACO Left 01/06/2018   Procedure: CATARACT EXTRACTION PHACO AND INTRAOCULAR LENS PLACEMENT (IOC);  Surgeon: Myrna Adine Anes, MD;  Location: [facility] ORS;  Service: Ophthalmology;  Laterality: Left;  US  00:27.5 AP% 4.3 CDE 1.18 FLUID PACK LOT # [id number]   CATARACT EXTRACTION W/PHACO Right 02/10/2018   Procedure: CATARACT EXTRACTION PHACO AND INTRAOCULAR LENS PLACEMENT (IOC);  Surgeon: Myrna Adine Anes, MD;  Location: [facility] ORS;  Service: Ophthalmology;  Laterality: Right;  US  00:30.5 AP% 5.0 CDE 1.51  Fluid pack lot # [id number]   EYE SURGERY  2019   cataract   FOOT X 2     HEEL SPUR EXCISION     TONSILLECTOMY     Patient Active Problem List   Diagnosis Date Noted   Elevated liver enzymes 06/14/2023   Osteoarthritis cervical spine 01/14/2022   Age-related osteoporosis without current pathological fracture 05/19/2021   Spondylosis of lumbar region without myelopathy or radiculopathy 05/19/2021   Degenerative disc disease, lumbar 05/19/2021   Scoliosis, unspecified 01/09/2015   Vitamin D  deficiency 01/08/2014   Mixed hyperlipidemia 01/08/2014   PCP: Justus Doffing,  MD  REFERRING PROVIDER: Emi Bills, MD  REFERRING DIAG: M41.9 (ICD-10-CM) - Scoliosis, unspecified   Rationale for Evaluation and Treatment: Rehabilitation  THERAPY DIAG:  Muscle weakness (generalized)  Joint stiffness of spine  Other low back pain  Other idiopathic scoliosis, lumbar region  ONSET DATE: chronic          SUBJECTIVE:  SUBJECTIVE STATEMENT: Pt. Reports chronic low back pain everyday.  Pt. Uses MH and hot tub daily to manage symptoms.  Increase pain with prolonged standing/ driving and activity during the day.  Pt. Known well to PT clinic and discharged from PT earlier this year.  Pt. Is scheduled for major back surgery with Dr. Emi for spinal fusion after summer.   Pt. Reports L LE/calf/foot N/T and weakness (no falls reported).  Pt. Wants to return to walking with trekking poles and no back symptoms.  Pt. Returned to exercise with [employer] (personal trainer) 5 weeks ago and 1st session was difficult/ resulted in several days of soreness. Pt. Scheduled for [employer] ex. On Tuesday.  Pt. Having a pulmonary functional test next week for lung clearance for back surgery.   PERTINENT HISTORY:  Pt. Well known to PT clinic.    PAIN:  Are you having pain? Yes: NPRS scale: 3/10 Pain location: low back Pain description: aching/ constant numbness in L LE/ calf/ bottom of foot.  R hip to R foot pain will result in giving out with walking.  Aggravating factors: increase activity/ prolonged standing and walking Relieving factors: heat (hot tub use)  PRECAUTIONS: Back/ scoliosis  RED FLAGS: None   WEIGHT BEARING RESTRICTIONS: No  FALLS:  Has patient fallen in last 6 months? No  LIVING ENVIRONMENT: Lives with: lives alone Lives in: House/apartment Has following equipment at home: Single  point cane  OCCUPATION: Retired  PLOF: Independent  PATIENT GOALS: Increase activity during the day with less back pain.    NEXT MD VISIT: PRN  OBJECTIVE:  Note: Objective measures were completed at Evaluation unless otherwise noted.  DIAGNOSTIC FINDINGS:  See imaging  PATIENT SURVEYS:  MODI: 64% self-perceived extreme disability.    COGNITION: Overall cognitive status: Within functional limits for tasks assessed     SENSATION: L LE to foot N/T  MUSCLE LENGTH: Hamstrings: Right 26 deg; Left 70 deg  POSTURE: rounded shoulders and forward head.  Moderate scoliosis (L to R).    PALPATION: (+) tenderness in R low thoracic/lumbar spine (hypomobile).    LUMBAR ROM:   AROM eval  Flexion 60 deg.  Extension 12 deg.  Right lateral flexion 50% limited  Left lateral flexion 50% limited  Right rotation 50% limited  Left rotation 50% limited   (Blank rows = not tested)  LOWER EXTREMITY ROM:     Active  Right eval Left eval  Hip flexion 115 deg. 112 deg.  Hip extension    Hip abduction [facility]  Hip adduction    Hip internal rotation 18 deg. 28 deg.  Hip external rotation    Knee flexion 136 deg. 132 deg.   Knee extension 0 deg. 0 deg.   Ankle dorsiflexion    Ankle plantarflexion    Ankle inversion    Ankle eversion     (Blank rows = not tested)  LOWER EXTREMITY MMT:    MMT Right eval Left eval  Hip flexion 4 4  Hip extension    Hip abduction 4 4  Hip adduction    Hip internal rotation 3+ 3+  Hip external rotation 3+ 3+  Knee flexion 4+ 4+  Knee extension 4 5  Ankle dorsiflexion 4 4  Ankle plantarflexion    Ankle inversion    Ankle eversion     (Blank rows = not tested)  LUMBAR SPECIAL TESTS:  FABER test: Negative  FUNCTIONAL TESTS:  5 times sit to stand: TBD  GAIT: Distance walked: in  clinic Assistive device utilized: None Level of assistance: Modified independence Comments: Pt. Ambulates with consistent recip. Gait pattern with no  assistive devices.  Pt. Has rounded shoulder posture with limited arm swing.  Good heel strike/ clearance.    5xSTS: 41.5 seconds.    MODI: 62%  TREATMENT DATE:  04/06/24               Subjective:  Pt. reports mild LBP currently after her morning routine which is moving around and getting into the hot tub, but is unable to quantify her pain. Pt. Reports she has not been able to sleep well the past week and has experienced high levels of pain (10/10) which she has taken Hydrocodone on most days to try to alleviate.   There.ex.:   Nustep L4 for 10 minutes B UE/LE to improve strength and cardiorespiratory endurance.  No increase c/o pain.   Discussed current back pain and symptoms over the past week.   Seated: Scap retractions with green TB, SBA , verbal cues for TrA engagement to improve posture (30x). Shoulder extension GTB 30x. Horizontal flexion GTB 30x.  Moderate cuing for proper technique/ posture correction.    Standing Paloff presses with green TB to improve posture 15x bilaterally  Supine TrA ex.:  20x TrA activation and hold with manual feedback Marches 1x20 Deadbugs 1x20   Standing Marches to 6 inch step, 1x60 seconds (verbally cued for proper core engagement)   Manual: Supine LE/lumbar stretches/hip PROM- 12 min.  Focus on KTC/ hip IR/ ER/ distal hamstring.    Seated STM to mid-thoracic/lumbar paraspinals with use of Hypervolt (no tenderness noted on this day)  PATIENT EDUCATION:  Education details: Aquatic ex./ stretches/ HEP Person educated: Patient Education method: Medical illustrator Education comprehension: verbalized understanding and returned demonstration  HOME EXERCISE PROGRAM: Discussed ex. With Sotero -static floor, cervical rotn., piriformis stretch, hip rotn., pelvic tilts, hip rotn. With bent knee, floor press (lying hip adductor stretch), hip abduction, shoulder rotn. In supine, overhead extension (bent legs).     ASSESSMENT:  CLINICAL  IMPRESSION:   PT continues to respond well to manual lumbar/bilateral hip stretches and STM to lumbar paraspinals. Pt. Noted to have decreased hip IR bilaterally with a boney end feel. Pt. was introduced to new exercise on this day which was standing marches to ~ 6 inch step height to challenge stability and strength of lumbar spine/core and hip flexion endurance. Pt. Tolerated new exercise well and was able to complete without significant difficulty. Pt. Did not report an increase in pain at end of tx. Session and stated that she feels like today will be a better day in terms of low back pain. Pt. Will continue to monitor and progress as able.   OBJECTIVE IMPAIRMENTS: Abnormal gait, decreased activity tolerance, decreased endurance, decreased mobility, difficulty walking, decreased ROM, decreased strength, impaired flexibility, improper body mechanics, postural dysfunction, and pain.   ACTIVITY LIMITATIONS: carrying, lifting, bending, standing, squatting, transfers, and locomotion level  PARTICIPATION LIMITATIONS: cleaning, laundry, driving, shopping, and community activity  PERSONAL FACTORS: Fitness and Past/current experiences are also affecting patient's functional outcome.   REHAB POTENTIAL: Good  CLINICAL DECISION MAKING: Evolving/moderate complexity  EVALUATION COMPLEXITY: Moderate   GOALS: Goals reviewed with patient? Yes  LONG TERM GOALS: Target date: 04/20/24  Pt. Independent with HEP to progress LE muscle strength 1/2 muscle grade to improve pain-free mobility.   Baseline: Goal status: Partially met  2.  Pt. Will decrease MODI to <40 to improve pain-free mobility.  Baseline: initial 64% self-perceived extreme disability.  7/31: 62%  Goal status: Not met  3.  Pt. Will increase lumbar AROM 25% to improve functional mobility/ household tasks.   Baseline: See above Goal status: Not met  3.  Pt. Will report <5/10 back pain at worst with daily household tasks.   Baseline:  8/10 pain at worst.  7/10: varying levels of pain with daily tasks Goal status: Partially met  4.  Pt. Will increase B LE/core muscle strength to 4+/5 MMT to improve standing/walking tasks in preparation for spinal surgery.   Baseline: see above Goal status: Not met  PLAN:  PT FREQUENCY: 1x/week  PT DURATION: 8 weeks  PLANNED INTERVENTIONS: 97110-Therapeutic exercises, 97530- Therapeutic activity, 97112- Neuromuscular re-education, 97535- Self Care, 02859- Manual therapy, [phone]- Gait training, [phone]- Aquatic Therapy, Patient/Family education, Balance training, Stair training, Dry Needling, Joint mobilization, Cryotherapy, and Moist heat.  PLAN FOR NEXT SESSION:  Progress core strengthening if patient pain and irritability is low during next visit [facility] or supine deadbugs).  Curtistine Bracket, SPT  Ozell JAYSON Sero, PT, DPT # [phone] Physical Therapist - [facility] 04/06/2024, 9:03 AM

## 2024-04-07 ENCOUNTER — Encounter: Payer: Self-pay | Admitting: Internal Medicine

## 2024-04-07 NOTE — Telephone Encounter (Signed)
 Please review.  KP

## 2024-04-08 ENCOUNTER — Encounter: Payer: Self-pay | Admitting: Internal Medicine

## 2024-04-10 NOTE — Telephone Encounter (Signed)
 Please schedule pt a appt.  KP

## 2024-04-10 NOTE — Telephone Encounter (Signed)
 Patient stated she had two nights of restful sleep and does not need the appointment.

## 2024-04-13 ENCOUNTER — Encounter: Payer: Self-pay | Admitting: Internal Medicine

## 2024-04-13 ENCOUNTER — Ambulatory Visit: Admitting: Internal Medicine

## 2024-04-13 ENCOUNTER — Ambulatory Visit: Admitting: Physical Therapy

## 2024-04-13 VITALS — BP 122/74 | HR 66 | Ht 67.0 in | Wt 188.0 lb

## 2024-04-13 DIAGNOSIS — M4126 Other idiopathic scoliosis, lumbar region: Secondary | ICD-10-CM | POA: Diagnosis not present

## 2024-04-13 DIAGNOSIS — M41125 Adolescent idiopathic scoliosis, thoracolumbar region: Secondary | ICD-10-CM

## 2024-04-13 DIAGNOSIS — R0609 Other forms of dyspnea: Secondary | ICD-10-CM

## 2024-04-13 DIAGNOSIS — M6281 Muscle weakness (generalized): Secondary | ICD-10-CM

## 2024-04-13 DIAGNOSIS — Z01811 Encounter for preprocedural respiratory examination: Secondary | ICD-10-CM

## 2024-04-13 DIAGNOSIS — M5459 Other low back pain: Secondary | ICD-10-CM

## 2024-04-13 DIAGNOSIS — R911 Solitary pulmonary nodule: Secondary | ICD-10-CM

## 2024-04-13 DIAGNOSIS — Z9189 Other specified personal risk factors, not elsewhere classified: Secondary | ICD-10-CM

## 2024-04-13 DIAGNOSIS — M2560 Stiffness of unspecified joint, not elsewhere classified: Secondary | ICD-10-CM | POA: Diagnosis not present

## 2024-04-13 LAB — BRAIN NATRIURETIC PEPTIDE: Pro B Natriuretic peptide (BNP): 121 pg/mL — ABNORMAL HIGH (ref 0.0–100.0)

## 2024-04-13 NOTE — Progress Notes (Signed)
 OV 04/13/2024  Subjective:  Patient ID: Jennifer Morrow, female , DOB: 31-Mar-1951 , age 73 y.o. , MRN: [id number] , ADDRESS: [address] [place] [address] PCP Lemon Raisin, MD Patient Care Team: Lemon Raisin, MD as PCP - General (Internal Medicine) Cherilyn Debby CROME, MD as Consulting Physician (Endocrinology) Cathlyn Seal, MD (Dermatology) Myrna Adine Anes, MD as Consulting Physician (Ophthalmology) Rococs, Hosey, MD as Referring Physician (Spine Surgery) Geronimo Amel, MD as Consulting Physician (Pulmonary Disease)  This Provider for this visit: Treatment Team:  Attending Provider: Geronimo Amel, MD    04/13/2024 -   Chief Complaint  Patient presents with   Medical Management of Chronic Issues   Shortness of Breath    Breathing is unchanged. She is here to review PFT, CT and ECHO.      HPI Jennifer Morrow 73 y.o. -returns for follow-up.  She is 18 days away from having her 8-hour spine surgery for her severe scoliosis.  She is here to review some preop evaluation.  No new medical problems other than the fact she is having significant insomnia I had of the surgery sleeping only 2 hours.  Her primary care physician wants to see her face-to-face before prescribing anything because she is already on Norco as needed and gabapentin.  She has had previous delirium with Hycodan.  She wanted to see if I could prescribe a medication because she does not have time to see the primary care physician.  Given prior delirium episodes with Hycodan and ongoing gabapentin I advised her to talk to primary care physician.     She is here for preop risk assessment and to focus on that.  There is possible mild pulmonary hypertension on the echocardiogram but the lung fields are clear she is restricted at 64% because of the scoliosis but DLCO is normal.  Risk score suggest low-low/moderate risk for prolonged ventilator dependence after spine surgery.  This is an acceptable  risk.  I will get a BNP to ensure there is no significant clinical pulmonary hypertension.  Incidental finding of 0.2 cm left upper lobe nodule made.  This was in March 2025.  She has passive smoking exposure history till age 63.  Will get a CT scan in March 2026.    CT Chest data from date:   - personally visualized and independently interpreted : yes - my findings are: per below   IMPRESSION: 1. Moderate to severe long segment thoracolumbar S-shaped spinal curvature. 2. No evidence of interstitial lung disease. 3. Mild to moderate patchy air trapping throughout both lungs, indicative of small airways disease. 4. Diffuse hepatic steatosis. 5. Solid posterior left upper lobe 0.2 cm pulmonary nodule. Per Fleischner Society Guidelines, a non-contrast Chest CT at 12 months is optional. If performed and the nodule is stable at 12 months, no further follow-up is recommended. 6. Aortic Atherosclerosis (ICD10-I70.0).     Electronically Signed   By: Selinda DELENA Blue M.D.   On: 11/13/2023 15:39   PFT     Latest Ref Rng & Units 12/27/2023    8:10 AM  PFT Results  FVC-Pre L 2.14   FVC-Predicted Pre % 64   FVC-Post L 2.15   FVC-Predicted Post % 64   Pre FEV1/FVC % % 72   Post FEV1/FCV % % 66   FEV1-Pre L 1.54   FEV1-Predicted Pre % 60   FEV1-Post L 1.42   DLCO uncorrected ml/min/mmHg 19.58   DLCO UNC% % 92   DLVA  Predicted % 129   TLC L 4.97   TLC % Predicted % 90   RV % Predicted % 114      IMPRESSIONS  - ECHO Marhc 2025    1. Left ventricular ejection fraction, by estimation, is 65 to 70%. Left  ventricular ejection fraction by 3D volume is 67 %. The left ventricle has  normal function. The left ventricle has no regional wall motion  abnormalities. Left ventricular diastolic   parameters were normal. The average left ventricular global longitudinal  strain is -16.7 %. The global longitudinal strain is mildly abnormal.   2. Right ventricular systolic function is normal.  The right ventricular  size is normal. There is mildly elevated pulmonary artery systolic  pressure. The estimated right ventricular systolic pressure is 36.4 mmHg.   3. The mitral valve is grossly normal. Trivial mitral valve  regurgitation.   4. The aortic valve is tricuspid. Aortic valve regurgitation is not  visualized.   5. The inferior vena cava is normal in size with greater than 50%  respiratory variability, suggesting right atrial pressure of 3 mmHg.   Comparison(s): No prior Echocardiogram.    LAB RESULTS last 96 hours No results found.       has a past medical history of Allergy (2014), Arthritis, Bronchitis, Bulging lumbar disc, Cataract, Scoliosis, and Spinal stenosis.   reports that she has never smoked. She has been exposed to tobacco smoke. She has never used smokeless tobacco.  Past Surgical History:  Procedure Laterality Date   CATARACT EXTRACTION W/PHACO Left 01/06/2018   Procedure: CATARACT EXTRACTION PHACO AND INTRAOCULAR LENS PLACEMENT (IOC);  Surgeon: Myrna Adine Anes, MD;  Location: [facility] ORS;  Service: Ophthalmology;  Laterality: Left;  US  00:27.5 AP% 4.3 CDE 1.18 FLUID PACK LOT # [id number]   CATARACT EXTRACTION W/PHACO Right 02/10/2018   Procedure: CATARACT EXTRACTION PHACO AND INTRAOCULAR LENS PLACEMENT (IOC);  Surgeon: Myrna Adine Anes, MD;  Location: [facility] ORS;  Service: Ophthalmology;  Laterality: Right;  US  00:30.5 AP% 5.0 CDE 1.51  Fluid pack lot # [id number]   EYE SURGERY  2019   cataract   FOOT X 2     HEEL SPUR EXCISION     TONSILLECTOMY      Allergies  Allergen Reactions   Celecoxib Other (See Comments)    Elevated LFT's   Oxaprozin Other (See Comments)    Elevated LFT's   Terbinafine Other (See Comments)    Elevated LFT's    Immunization History  Administered Date(s) Administered   Fluad Quad(high Dose 65+) 05/01/2019, 05/19/2021   Hepatitis A 11/01/2002, 05/01/2003   Hepatitis B, ADULT 08/17/1990   INFLUENZA, HIGH DOSE  SEASONAL PF 05/07/2017, 05/05/2018, 04/06/2020, 04/06/2024   IPV 08/17/1990   Influenza, Seasonal, Injecte, Preservative Fre 05/09/2013   Influenza,inj,Quad PF,6+ Mos 05/20/2016   Influenza-Unspecified 06/08/2015, 04/21/2022, 05/04/2023   Meningococcal Conjugate 06/28/2018   PFIZER Comirnaty(Gray Top)Covid-19 Tri-Sucrose Vaccine 04/06/2020   PFIZER(Purple Top)SARS-COV-2 Vaccination 09/09/2019, 10/01/2019, 05/02/2022, 05/04/2023   PNEUMOCOCCAL CONJUGATE-20 09/23/2021   Pfizer Covid-19 Vaccine Bivalent Booster 26yrs & up 05/19/2021, 01/08/2022   Pneumococcal Conjugate-13 07/26/2018   Pneumococcal Polysaccharide-23 07/21/2017   Rabies Immune Globulin 06/28/2018, 07/05/2018, 07/19/2018   Respiratory Syncytial Virus Vaccine,Recomb Aduvanted(Arexvy) 04/21/2022   Rubella 08/17/1981   Smallpox 08/17/1981   Td 08/17/1994   Tdap 01/09/2014   Typhoid Live 06/28/2018   Yellow Fever 06/03/2018   Zoster Recombinant(Shingrix) 11/02/2017, 03/08/2018   Zoster, Live 05/10/2013    Family History  Problem Relation Age of Onset  Heart disease Mother    Depression Mother    Heart disease Father    COPD Father    Hypertension Sister    Anxiety disorder Sister    Asthma Sister    Depression Sister    Hypertension Sister    Asthma Brother    Hearing loss Brother    ADD / ADHD Son    Diabetes Maternal Grandmother    Obesity Maternal Grandmother    Heart disease Maternal Grandfather    Diabetes Maternal Grandfather    Heart disease Paternal Grandmother    Vision loss Paternal Grandmother    Breast cancer Neg Hx      Current Outpatient Medications:    Calcium-Vitamin D -Vitamin K 650-12.5-40 MG-MCG-MCG CHEW, , Disp: , Rfl:    cetirizine (ZYRTEC) 10 MG tablet, Take 10 mg by mouth daily as needed for allergies., Disp: , Rfl:    gabapentin (NEURONTIN) 600 MG tablet, Take 600 mg by mouth 3 (three) times daily., Disp: , Rfl:    HYDROcodone-acetaminophen (NORCO/VICODIN) 5-325 MG tablet, Take by  mouth as needed. Pt taking 5 times per day, Disp: , Rfl:    meloxicam (MOBIC) 15 MG tablet, Take 15 mg by mouth as needed for pain., Disp: , Rfl:    tiZANidine (ZANAFLEX) 2 MG tablet, Take 2 mg by mouth daily as needed for muscle pain., Disp: , Rfl:    triamcinolone  (NASACORT  ALLERGY 24HR) 55 MCG/ACT AERO nasal inhaler, Place 2 sprays into the nose 2 (two) times daily., Disp: 16.9 mL, Rfl: 0   Vitamin D , Ergocalciferol , (DRISDOL) 1.25 MG (50000 UNIT) CAPS capsule, Take 50,000 Units by mouth every 7 (seven) days., Disp: , Rfl:       Objective:   Vitals:   04/13/24 1301  BP: 122/74  Pulse: 66  SpO2: 98%  Weight: 188 lb (85.3 kg)  Height: 5' 7 (1.702 m)    Estimated body mass index is 29.44 kg/m as calculated from the following:   Height as of this encounter: 5' 7 (1.702 m).   Weight as of this encounter: 188 lb (85.3 kg).  @WEIGHTCHANGE @  Filed Weights   04/13/24 1301  Weight: 188 lb (85.3 kg)     Physical Exam   General: No distress. Lookk swel O2 at rest: no Cane present: no Sitting in wheel chair: no Frail: no Obese: no Neuro: Alert and Oriented x 3. GCS 15. Speech normal Psych: Pleasant Resp:  Barrel Chest - no.  Wheeze - no, Crackles - no. CHEST - SCOLIOSIS, No overt respiratory distress CVS: Normal heart sounds. Murmurs - no Ext: Stigmata of Connective Tissue Disease - no HEENT: Normal upper airway. PEERL +. No post nasal drip        Assessment/     Assessment & Plan idiopathic scoliosis of thoracolumbar region  DOE (dyspnea on exertion)  Preop respiratory exam  Nodule of upper lobe of left lung  At risk from passive smoking    1) RISK FOR PROLONGED MECHANICAL VENTILAION - > 48h  1A) Arozullah - Prolonged mech ventilation risk Arozullah Postperative Pulmonary Risk Score - for mech ventilation dependence >48h USAA, Ann Surg 2000, major non-cardiac surgery) Comment Score  Type of surgery - abd ao aneurysm (27), thoracic (21),  neurosurgery / upper abdominal / vascular (21), neck (11) spine 0  Emergency Surgery - (11) elective 0  ALbumin < 3 or poor nutritional state - (9)  0  BUN > 30 -  (8)  0  Partial or completely dependent  functional status - (7)  0  COPD -  (6) Restricte chest 6  Age - 60 to 27 (4), > 70  (6)  6  TOTAL  12  Risk Stratifcation scores  - < 10 (0.5%), 11-19 (1.8%), 20-27 (4.2%), 28-40 (10.1%), >40 (26.6%)  Low modrate        1B) GUPTA - Prolonged Mech Vent Risk Score source Risk  Guptal post op prolonged mech ventilation > 48h or reintubation < 30 days - ACS 2007-2008 dataset - [email or web address] 1.4%       PLAN Patient Instructions     ICD-10-CM   1. idiopathic scoliosis of thoracolumbar region  M41.125     2. DOE (dyspnea on exertion)  R06.09     3. Preop respiratory exam  Z01.811     4. Nodule of upper lobe of left lung  R91.1     5. At risk from passive smoking  Z91.89        #Shortness of breath   - is all from scoliosis - lung function is at 2/3rd normal capacity from chest cage restriction  - no lung disease per se  - echo suggests possible mild pulmonary hypertension as a result of scoliosis  Plan - check BNP 04/13/2024   #Left lung nodule 0.2cm - march 2025  Plan  - ct chest without contrast 1 year - marc 2026  #Preop resp exam for spine surgerr  Low -Moderate/Moderate risk for pulmonary complications from spine srurgery  Plan  - no contraindication  #INsomnia  Plan  - per pcp  FOllowup  - March 2026 after CT chest       FOLLOWUP    Return for march 2026 after ct chest.    SIGNATURE    Dr. Dorethia Cave, M.D., F.C.C.P,  Pulmonary and Critical Care Medicine Staff Physician, [facility] Director - Interstitial Lung Disease  Program  Pulmonary Fibrosis [facility] at [facility] [place], [place], [address]  Pager: [phone], If no answer  or between  15:00h - 7:00h: call [phone] Telephone: [phone]  1:38 PM 04/13/2024

## 2024-04-13 NOTE — Patient Instructions (Addendum)
 ICD-10-CM   1. idiopathic scoliosis of thoracolumbar region  M41.125     2. DOE (dyspnea on exertion)  R06.09     3. Preop respiratory exam  Z01.811     4. Nodule of upper lobe of left lung  R91.1     5. At risk from passive smoking  Z91.89        #Shortness of breath   - is all from scoliosis - lung function is at 2/3rd normal capacity from chest cage restriction  - no lung disease per se  - echo suggests possible mild pulmonary hypertension as a result of scoliosis  Plan - check BNP 04/13/2024   #Left lung nodule 0.2cm - march 2025  Plan  - ct chest without contrast 1 year - marc 2026  #Preop resp exam for spine surgerr  Low -Moderate/Moderate risk for pulmonary complications from spine srurgery  Plan  - no contraindication  #INsomnia  Plan  - per pcp  FOllowup  - March 2026 after CT chest

## 2024-04-13 NOTE — Therapy (Unsigned)
 OUTPATIENT PHYSICAL THERAPY THORACOLUMBAR TREATMENT  Patient Name: RONALEE SCHEUNEMANN MRN: [id number] DOB:1951/07/08, 73 y.o., female Today's Date: 04/13/2024  END OF SESSION:  PT End of Session - 04/13/24 0731     Visit Number 13    Number of Visits 16    Date for PT Re-Evaluation 04/20/24    PT Start Time 0733    PT Stop Time 0815    PT Time Calculation (min) 42 min    Activity Tolerance Patient tolerated treatment well;No increased pain    Behavior During Therapy WFL for tasks assessed/performed          Past Medical History:  Diagnosis Date   Allergy 2014   seasonal   Arthritis    Bronchitis    CHRONIC   Bulging lumbar disc    degenerative dics   Cataract    corrected with surgery   Scoliosis    LIMITS LUNG FUNCTION   Spinal stenosis    Past Surgical History:  Procedure Laterality Date   CATARACT EXTRACTION W/PHACO Left 01/06/2018   Procedure: CATARACT EXTRACTION PHACO AND INTRAOCULAR LENS PLACEMENT (IOC);  Surgeon: Myrna Adine Anes, MD;  Location: [facility] ORS;  Service: Ophthalmology;  Laterality: Left;  US  00:27.5 AP% 4.3 CDE 1.18 FLUID PACK LOT # [id number]   CATARACT EXTRACTION W/PHACO Right 02/10/2018   Procedure: CATARACT EXTRACTION PHACO AND INTRAOCULAR LENS PLACEMENT (IOC);  Surgeon: Myrna Adine Anes, MD;  Location: [facility] ORS;  Service: Ophthalmology;  Laterality: Right;  US  00:30.5 AP% 5.0 CDE 1.51  Fluid pack lot # [id number]   EYE SURGERY  2019   cataract   FOOT X 2     HEEL SPUR EXCISION     TONSILLECTOMY     Patient Active Problem List   Diagnosis Date Noted   Lumbar stenosis 04/03/2024   Elevated liver enzymes 06/14/2023   Osteoarthritis cervical spine 01/14/2022   Age-related osteoporosis without current pathological fracture 05/19/2021   Spondylosis of lumbar region without myelopathy or radiculopathy 05/19/2021   Degenerative disc disease, lumbar 05/19/2021   Scoliosis, unspecified 01/09/2015   Vitamin D  deficiency 01/08/2014   Mixed  hyperlipidemia 01/08/2014   PCP: Justus Doffing, MD  REFERRING PROVIDER: Emi Bills, MD  REFERRING DIAG: M41.9 (ICD-10-CM) - Scoliosis, unspecified   Rationale for Evaluation and Treatment: Rehabilitation  THERAPY DIAG:  Muscle weakness (generalized)  Joint stiffness of spine  Other low back pain  Other idiopathic scoliosis, lumbar region  ONSET DATE: chronic          SUBJECTIVE:  SUBJECTIVE STATEMENT: Pt. Reports chronic low back pain everyday.  Pt. Uses MH and hot tub daily to manage symptoms.  Increase pain with prolonged standing/ driving and activity during the day.  Pt. Known well to PT clinic and discharged from PT earlier this year.  Pt. Is scheduled for major back surgery with Dr. Emi for spinal fusion after summer.   Pt. Reports L LE/calf/foot N/T and weakness (no falls reported).  Pt. Wants to return to walking with trekking poles and no back symptoms.  Pt. Returned to exercise with [employer] (personal trainer) 5 weeks ago and 1st session was difficult/ resulted in several days of soreness. Pt. Scheduled for Sotero ex. On Tuesday.  Pt. Having a pulmonary functional test next week for lung clearance for back surgery.   PERTINENT HISTORY:  Pt. Well known to PT clinic.    PAIN:  Are you having pain? Yes: NPRS scale: 3/10 Pain location: low back Pain description: aching/ constant numbness in L LE/ calf/ bottom of foot.  R hip to R foot pain will result in giving out with walking.  Aggravating factors: increase activity/ prolonged standing and walking Relieving factors: heat (hot tub use)  PRECAUTIONS: Back/ scoliosis  RED FLAGS: None   WEIGHT BEARING RESTRICTIONS: No  FALLS:  Has patient fallen in last 6 months? No  LIVING ENVIRONMENT: Lives with: lives alone Lives in:  House/apartment Has following equipment at home: Single point cane  OCCUPATION: Retired  PLOF: Independent  PATIENT GOALS: Increase activity during the day with less back pain.    NEXT MD VISIT: PRN  OBJECTIVE:  Note: Objective measures were completed at Evaluation unless otherwise noted.  DIAGNOSTIC FINDINGS:  See imaging  PATIENT SURVEYS:  MODI: 64% self-perceived extreme disability.    COGNITION: Overall cognitive status: Within functional limits for tasks assessed     SENSATION: L LE to foot N/T  MUSCLE LENGTH: Hamstrings: Right 26 deg; Left 70 deg  POSTURE: rounded shoulders and forward head.  Moderate scoliosis (L to R).    PALPATION: (+) tenderness in R low thoracic/lumbar spine (hypomobile).    LUMBAR ROM:   AROM eval  Flexion 60 deg.  Extension 12 deg.  Right lateral flexion 50% limited  Left lateral flexion 50% limited  Right rotation 50% limited  Left rotation 50% limited   (Blank rows = not tested)  LOWER EXTREMITY ROM:     Active  Right eval Left eval  Hip flexion 115 deg. 112 deg.  Hip extension    Hip abduction [facility]  Hip adduction    Hip internal rotation 18 deg. 28 deg.  Hip external rotation    Knee flexion 136 deg. 132 deg.   Knee extension 0 deg. 0 deg.   Ankle dorsiflexion    Ankle plantarflexion    Ankle inversion    Ankle eversion     (Blank rows = not tested)  LOWER EXTREMITY MMT:    MMT Right eval Left eval  Hip flexion 4 4  Hip extension    Hip abduction 4 4  Hip adduction    Hip internal rotation 3+ 3+  Hip external rotation 3+ 3+  Knee flexion 4+ 4+  Knee extension 4 5  Ankle dorsiflexion 4 4  Ankle plantarflexion    Ankle inversion    Ankle eversion     (Blank rows = not tested)  LUMBAR SPECIAL TESTS:  FABER test: Negative  FUNCTIONAL TESTS:  5 times sit to stand: TBD  GAIT: Distance walked: in  clinic Assistive device utilized: None Level of assistance: Modified independence Comments: Pt.  Ambulates with consistent recip. Gait pattern with no assistive devices.  Pt. Has rounded shoulder posture with limited arm swing.  Good heel strike/ clearance.    5xSTS: 41.5 seconds.    MODI: 62%  TREATMENT DATE:  04/13/24               Subjective:  Pt. reports mild low back pain at start of tx. Session but is unable to quantify her pain. Pt. continues to report poor sleep quality over the past week and has had to take Hydrocodone as needed. Pt. Has appointment with pulmonologist later today to discuss previous testing.   There.ex.:   Nustep L4 for 10 minutes B UE/LE to improve strength and cardiorespiratory endurance.  No increase c/o pain.   Discussed current back pain and symptoms over the past week.   Seated: Scap retractions with green TB, SBA , verbal cues for TrA engagement to improve posture (30x). Verbal  cuing for proper technique/ posture correction.    Nautilus 30# Walk-outs, 1 x 8 bilaterally (started with higher resistance but concerns of shoulder pain)  Nautilus 30# Paloff Press, 1x20 bilaterally   Supine TrA ex.:  20x TrA activation and hold with manual feedback Marches 1x20 Deadbugs 1x20   Standing Marches to 6 inch step, 2x50 seconds (verbally cued for proper core engagement)   Manual: Supine LE/lumbar stretches/hip PROM- 12 min.  Focus on KTC/ hip IR/ ER/ distal hamstring.    Seated STM to mid-thoracic/lumbar paraspinals with use of Hypervolt (no tenderness noted on this day)  Not Performed on this Day:  Standing Paloff presses with green TB to improve posture 15x bilaterally  PATIENT EDUCATION:  Education details: Aquatic ex./ stretches/ HEP Person educated: Patient Education method: Medical illustrator Education comprehension: verbalized understanding and returned demonstration  HOME EXERCISE PROGRAM: Discussed ex. With Sotero -static floor, cervical rotn., piriformis stretch, hip rotn., pelvic tilts, hip rotn. With bent knee, floor press  (lying hip adductor stretch), hip abduction, shoulder rotn. In supine, overhead extension (bent legs).     ASSESSMENT:  CLINICAL IMPRESSION:   Pt. introduced to two new exercises on this day which were resisted walkouts and Paloff Press  on Nautilus machine with 30# resistance to further challenge core engagement and improve core and low back strength/stability. Pt. was initially started with higher resistance for resisted walkouts but required lowering to 30# due to concerns of increased shoulder pain. Pt. Tolerated resistance for both new exercises and did not report an increase in low back pain with any activity on this day. Pt. Required cueing during standing steps to 6-inch stairs and during supine deadbugs to engage core while maintaining normal breathing pattern which she was able to correct. Pt. Responded well to manual therapy techniques including STM with Hypervolt. Will continue to monitor and progress as able.   OBJECTIVE IMPAIRMENTS: Abnormal gait, decreased activity tolerance, decreased endurance, decreased mobility, difficulty walking, decreased ROM, decreased strength, impaired flexibility, improper body mechanics, postural dysfunction, and pain.   ACTIVITY LIMITATIONS: carrying, lifting, bending, standing, squatting, transfers, and locomotion level  PARTICIPATION LIMITATIONS: cleaning, laundry, driving, shopping, and community activity  PERSONAL FACTORS: Fitness and Past/current experiences are also affecting patient's functional outcome.   REHAB POTENTIAL: Good  CLINICAL DECISION MAKING: Evolving/moderate complexity  EVALUATION COMPLEXITY: Moderate   GOALS: Goals reviewed with patient? Yes  LONG TERM GOALS: Target date: 04/20/24  Pt. Independent with HEP to progress LE muscle strength 1/2  muscle grade to improve pain-free mobility.   Baseline: Goal status: Partially met  2.  Pt. Will decrease MODI to <40 to improve pain-free mobility.   Baseline: initial 64%  self-perceived extreme disability.  7/31: 62%  Goal status: Not met  3.  Pt. Will increase lumbar AROM 25% to improve functional mobility/ household tasks.   Baseline: See above Goal status: Not met  3.  Pt. Will report <5/10 back pain at worst with daily household tasks.   Baseline: 8/10 pain at worst.  7/10: varying levels of pain with daily tasks Goal status: Partially met  4.  Pt. Will increase B LE/core muscle strength to 4+/5 MMT to improve standing/walking tasks in preparation for spinal surgery.   Baseline: see above Goal status: Not met  PLAN:  PT FREQUENCY: 1x/week  PT DURATION: 8 weeks  PLANNED INTERVENTIONS: 97110-Therapeutic exercises, 97530- Therapeutic activity, 97112- Neuromuscular re-education, 97535- Self Care, 02859- Manual therapy, [phone]- Gait training, [phone]- Aquatic Therapy, Patient/Family education, Balance training, Stair training, Dry Needling, Joint mobilization, Cryotherapy, and Moist heat.  PLAN FOR NEXT SESSION:  Continue core stability/strengthening activities in standing with Nautilus and steps (without rotational activities).   Curtistine Bracket, SPT  Ozell JAYSON Sero, PT, DPT # [phone] Physical Therapist - [facility] 04/13/2024, 9:27 AM

## 2024-04-14 ENCOUNTER — Encounter: Payer: Self-pay | Admitting: Physical Therapy

## 2024-04-20 ENCOUNTER — Ambulatory Visit: Attending: Spine Surgery | Admitting: Physical Therapy

## 2024-04-20 DIAGNOSIS — M2560 Stiffness of unspecified joint, not elsewhere classified: Secondary | ICD-10-CM | POA: Diagnosis not present

## 2024-04-20 DIAGNOSIS — M5459 Other low back pain: Secondary | ICD-10-CM | POA: Diagnosis not present

## 2024-04-20 DIAGNOSIS — M6281 Muscle weakness (generalized): Secondary | ICD-10-CM | POA: Insufficient documentation

## 2024-04-20 DIAGNOSIS — M4126 Other idiopathic scoliosis, lumbar region: Secondary | ICD-10-CM | POA: Diagnosis not present

## 2024-04-20 NOTE — Therapy (Signed)
 OUTPATIENT PHYSICAL THERAPY THORACOLUMBAR TREATMENT  Patient Name: Jennifer Morrow MRN: [id number] DOB:[birth year], 73 y.o., female Today's Date: 04/20/2024  END OF SESSION:  PT End of Session - 04/20/24 0732     Visit Number 14    Number of Visits 16    Date for PT Re-Evaluation 04/20/24    PT Start Time 0729    PT Stop Time 0815    PT Time Calculation (min) 46 min    Activity Tolerance Patient tolerated treatment well;No increased pain    Behavior During Therapy WFL for tasks assessed/performed         Past Medical History:  Diagnosis Date   Allergy 2014   seasonal   Arthritis    Bronchitis    CHRONIC   Bulging lumbar disc    degenerative dics   Cataract    corrected with surgery   Scoliosis    LIMITS LUNG FUNCTION   Spinal stenosis    Past Surgical History:  Procedure Laterality Date   CATARACT EXTRACTION W/PHACO Left 01/06/2018   Procedure: CATARACT EXTRACTION PHACO AND INTRAOCULAR LENS PLACEMENT (IOC);  Surgeon: Myrna Adine Anes, MD;  Location: [facility] ORS;  Service: Ophthalmology;  Laterality: Left;  US  00:27.5 AP% 4.3 CDE 1.18 FLUID PACK LOT # [id number]   CATARACT EXTRACTION W/PHACO Right 02/10/2018   Procedure: CATARACT EXTRACTION PHACO AND INTRAOCULAR LENS PLACEMENT (IOC);  Surgeon: Myrna Adine Anes, MD;  Location: [facility] ORS;  Service: Ophthalmology;  Laterality: Right;  US  00:30.5 AP% 5.0 CDE 1.51  Fluid pack lot # [id number] H   EYE SURGERY  2019   cataract   FOOT X 2     HEEL SPUR EXCISION     TONSILLECTOMY     Patient Active Problem List   Diagnosis Date Noted   Lumbar stenosis 04/03/2024   Elevated liver enzymes 06/14/2023   Osteoarthritis cervical spine 01/14/2022   Age-related osteoporosis without current pathological fracture 05/19/2021   Spondylosis of lumbar region without myelopathy or radiculopathy 05/19/2021   Degenerative disc disease, lumbar 05/19/2021   Scoliosis, unspecified 01/09/2015   Vitamin D  deficiency 01/08/2014   Mixed  hyperlipidemia 01/08/2014   PCP: Justus Doffing, MD  REFERRING PROVIDER: Emi Bills, MD  REFERRING DIAG: M41.9 (ICD-10-CM) - Scoliosis, unspecified   Rationale for Evaluation and Treatment: Rehabilitation  THERAPY DIAG:  Muscle weakness (generalized)  Joint stiffness of spine  Other low back pain  Other idiopathic scoliosis, lumbar region  ONSET DATE: chronic          SUBJECTIVE:  SUBJECTIVE STATEMENT: Pt. Reports chronic low back pain everyday.  Pt. Uses MH and hot tub daily to manage symptoms.  Increase pain with prolonged standing/ driving and activity during the day.  Pt. Known well to PT clinic and discharged from PT earlier this year.  Pt. Is scheduled for major back surgery with Dr. Emi for spinal fusion after summer.   Pt. Reports L LE/calf/foot N/T and weakness (no falls reported).  Pt. Wants to return to walking with trekking poles and no back symptoms.  Pt. Returned to exercise with [employer]ero (personal trainer) 5 weeks ago and 1st session was difficult/ resulted in several days of soreness. Pt. Scheduled for Sotero ex. On Tuesday.  Pt. Having a pulmonary functional test next week for lung clearance for back surgery.   PERTINENT HISTORY:  Pt. Well known to PT clinic.    PAIN:  Are you having pain? Yes: NPRS scale: 3/10 Pain location: low back Pain description: aching/ constant numbness in L LE/ calf/ bottom of foot.  R hip to R foot pain will result in giving out with walking.  Aggravating factors: increase activity/ prolonged standing and walking Relieving factors: heat (hot tub use)  PRECAUTIONS: Back/ scoliosis  RED FLAGS: None   WEIGHT BEARING RESTRICTIONS: No  FALLS:  Has patient fallen in last 6 months? No  LIVING ENVIRONMENT: Lives with: lives alone Lives in:  House/apartment Has following equipment at home: Single point cane  OCCUPATION: Retired  PLOF: Independent  PATIENT GOALS: Increase activity during the day with less back pain.    NEXT MD VISIT: PRN  OBJECTIVE:  Note: Objective measures were completed at Evaluation unless otherwise noted.  DIAGNOSTIC FINDINGS:  See imaging  PATIENT SURVEYS:  MODI: 64% self-perceived extreme disability.    COGNITION: Overall cognitive status: Within functional limits for tasks assessed     SENSATION: L LE to foot N/T  MUSCLE LENGTH: Hamstrings: Right 26 deg; Left 70 deg  POSTURE: rounded shoulders and forward head.  Moderate scoliosis (L to R).    PALPATION: (+) tenderness in R low thoracic/lumbar spine (hypomobile).    LUMBAR ROM:   AROM eval  Flexion 60 deg.  Extension 12 deg.  Right lateral flexion 50% limited  Left lateral flexion 50% limited  Right rotation 50% limited  Left rotation 50% limited   (Blank rows = not tested)  LOWER EXTREMITY ROM:     Active  Right eval Left eval  Hip flexion 115 deg. 112 deg.  Hip extension    Hip abduction [facility]  Hip adduction    Hip internal rotation 18 deg. 28 deg.  Hip external rotation    Knee flexion 136 deg. 132 deg.   Knee extension 0 deg. 0 deg.   Ankle dorsiflexion    Ankle plantarflexion    Ankle inversion    Ankle eversion     (Blank rows = not tested)  LOWER EXTREMITY MMT:    MMT Right eval Left eval  Hip flexion 4 4  Hip extension    Hip abduction 4 4  Hip adduction    Hip internal rotation 3+ 3+  Hip external rotation 3+ 3+  Knee flexion 4+ 4+  Knee extension 4 5  Ankle dorsiflexion 4 4  Ankle plantarflexion    Ankle inversion    Ankle eversion     (Blank rows = not tested)  LUMBAR SPECIAL TESTS:  FABER test: Negative  FUNCTIONAL TESTS:  5 times sit to stand: TBD  GAIT: Distance walked: in  clinic Assistive device utilized: None Level of assistance: Modified independence Comments: Pt.  Ambulates with consistent recip. Gait pattern with no assistive devices.  Pt. Has rounded shoulder posture with limited arm swing.  Good heel strike/ clearance.    5xSTS: 41.5 seconds.    MODI: 62%  TREATMENT DATE:  04/20/24               Subjective:  Pt. reports mild low back pain at start of tx. Session but is unable to quantify her pain. Pt. Reports that she has been sleeping better recently except for last night when she woke up at 1:00 AM and could not go back to sleep. Pt. Has continued taking Hydrocodone and using heat modalities as needed to assist with pain management. Pt. stated that the testing that was reviewed by her Pulmonologist last week revealed heart failure, which will not have an affect on her upcoming surgery.   There.ex.:  Nustep L4 for 10 minutes B UE/LE to improve strength and cardiorespiratory endurance.  No increase c/o pain.  Discussed current back pain and symptoms over the past week.   Seated: Scap retractions with green TB, SBA , verbal cues for TrA engagement to improve posture (30x). Verbal  cuing for proper technique/ posture correction.    Nautilus 20# Walk-outs, 1 x 10 bilaterally   Nautilus 20# Paloff Press, 1x20 bilaterally   Standing Marches 2x60 seconds (verbally cued for proper core engagement)   Lateral Step Ups, 1x20 bilaterally  Supine TrA ex.:  20x TrA activation and hold with manual feedback Marches 1x20 Deadbugs 1x20   Manual: Supine LE/lumbar stretches/hip PROM- 12 min.  Focus on KTC/ hip IR/ ER/ hamstring    Seated STM to mid-thoracic/lumbar paraspinals with use of Hypervolt (no tenderness noted on this day)  Not Performed on this Day:  Standing Paloff presses with green TB to improve posture 15x bilaterally  PATIENT EDUCATION:  Education details: Aquatic ex./ stretches/ HEP Person educated: Patient Education method: Medical illustrator Education comprehension: verbalized understanding and returned demonstration  HOME  EXERCISE PROGRAM: Discussed ex. With Sotero -static floor, cervical rotn., piriformis stretch, hip rotn., pelvic tilts, hip rotn. With bent knee, floor press (lying hip adductor stretch), hip abduction, shoulder rotn. In supine, overhead extension (bent legs).     ASSESSMENT:  CLINICAL IMPRESSION:   Pt. Concerns of increased shoulder pain during resisted walkouts and Paloff Press on Nautilus machine with 30# resistance so weight was modified to 20# which she was able to tolerate. Pt. introduced to lateral step ups on bottom stair to increase endurance capacity of hip musculature which she was able to complete without significant difficulty. Pt. Required cueing during standing marches and supine deadbugs to maintain normal breathing pattern rather than holding her breath which she was able to correct. Pt. responded well to manual therapy techniques but with noticeable restriction in bilateral hip IR. Will continue to monitor and progress as able. Next session will be final tx. session before surgery.   OBJECTIVE IMPAIRMENTS: Abnormal gait, decreased activity tolerance, decreased endurance, decreased mobility, difficulty walking, decreased ROM, decreased strength, impaired flexibility, improper body mechanics, postural dysfunction, and pain.   ACTIVITY LIMITATIONS: carrying, lifting, bending, standing, squatting, transfers, and locomotion level  PARTICIPATION LIMITATIONS: cleaning, laundry, driving, shopping, and community activity  PERSONAL FACTORS: Fitness and Past/current experiences are also affecting patient's functional outcome.   REHAB POTENTIAL: Good  CLINICAL DECISION MAKING: Evolving/moderate complexity  EVALUATION COMPLEXITY: Moderate   GOALS: Goals reviewed with patient? Yes  LONG  TERM GOALS: Target date: 04/20/24  Pt. Independent with HEP to progress LE muscle strength 1/2 muscle grade to improve pain-free mobility.   Baseline: Goal status: Partially met  2.  Pt. Will decrease  MODI to <40 to improve pain-free mobility.   Baseline: initial 64% self-perceived extreme disability.  7/31: 62%  Goal status: Not met  3.  Pt. Will increase lumbar AROM 25% to improve functional mobility/ household tasks.   Baseline: See above Goal status: Not met  3.  Pt. Will report <5/10 back pain at worst with daily household tasks.   Baseline: 8/10 pain at worst.  7/10: varying levels of pain with daily tasks Goal status: Partially met  4.  Pt. Will increase B LE/core muscle strength to 4+/5 MMT to improve standing/walking tasks in preparation for spinal surgery.   Baseline: see above Goal status: Not met  PLAN:  PT FREQUENCY: 1x/week  PT DURATION: 8 weeks  PLANNED INTERVENTIONS: 97110-Therapeutic exercises, 97530- Therapeutic activity, 97112- Neuromuscular re-education, 97535- Self Care, 02859- Manual therapy, [phone]- Gait training, [phone]- Aquatic Therapy, Patient/Family education, Balance training, Stair training, Dry Needling, Joint mobilization, Cryotherapy, and Moist heat.  PLAN FOR NEXT SESSION:  Plan for final visit with next appointment. Check goals/ recert.    Curtistine Bracket, SPT  Ozell JAYSON Sero, PT, DPT # [phone] Physical Therapist - [facility] 04/20/2024, 8:15 AM

## 2024-04-27 ENCOUNTER — Ambulatory Visit: Admitting: Physical Therapy

## 2024-04-27 DIAGNOSIS — M6281 Muscle weakness (generalized): Secondary | ICD-10-CM

## 2024-04-27 DIAGNOSIS — M5459 Other low back pain: Secondary | ICD-10-CM

## 2024-04-27 DIAGNOSIS — M4126 Other idiopathic scoliosis, lumbar region: Secondary | ICD-10-CM | POA: Diagnosis not present

## 2024-04-27 DIAGNOSIS — M2560 Stiffness of unspecified joint, not elsewhere classified: Secondary | ICD-10-CM | POA: Diagnosis not present

## 2024-04-27 NOTE — Therapy (Unsigned)
 OUTPATIENT PHYSICAL THERAPY THORACOLUMBAR TREATMENT/DISCHARGE  Patient Name: Jennifer Morrow MRN: [id number] DOB:04-29-1951, 73 y.o., female Today's Date: 04/27/2024  END OF SESSION:  PT End of Session - 04/27/24 0727     Visit Number 15    Number of Visits 16    Date for PT Re-Evaluation 04/20/24    PT Start Time 0727    PT Stop Time 0813    PT Time Calculation (min) 46 min    Activity Tolerance Patient tolerated treatment well;No increased pain    Behavior During Therapy WFL for tasks assessed/performed         Past Medical History:  Diagnosis Date   Allergy 2014   seasonal   Arthritis    Bronchitis    CHRONIC   Bulging lumbar disc    degenerative dics   Cataract    corrected with surgery   Scoliosis    LIMITS LUNG FUNCTION   Spinal stenosis    Past Surgical History:  Procedure Laterality Date   CATARACT EXTRACTION W/PHACO Left 01/06/2018   Procedure: CATARACT EXTRACTION PHACO AND INTRAOCULAR LENS PLACEMENT (IOC);  Surgeon: Myrna Adine Anes, MD;  Location: [facility] ORS;  Service: Ophthalmology;  Laterality: Left;  US  00:27.5 AP% 4.3 CDE 1.18 FLUID PACK LOT # [id number]   CATARACT EXTRACTION W/PHACO Right 02/10/2018   Procedure: CATARACT EXTRACTION PHACO AND INTRAOCULAR LENS PLACEMENT (IOC);  Surgeon: Myrna Adine Anes, MD;  Location: [facility] ORS;  Service: Ophthalmology;  Laterality: Right;  US  00:30.5 AP% 5.0 CDE 1.51  Fluid pack lot # [id number]   EYE SURGERY  2019   cataract   FOOT X 2     HEEL SPUR EXCISION     TONSILLECTOMY     Patient Active Problem List   Diagnosis Date Noted   Lumbar stenosis 04/03/2024   Elevated liver enzymes 06/14/2023   Osteoarthritis cervical spine 01/14/2022   Age-related osteoporosis without current pathological fracture 05/19/2021   Spondylosis of lumbar region without myelopathy or radiculopathy 05/19/2021   Degenerative disc disease, lumbar 05/19/2021   Scoliosis, unspecified 01/09/2015   Vitamin D  deficiency 01/08/2014    Mixed hyperlipidemia 01/08/2014   PCP: Justus Doffing, MD  REFERRING PROVIDER: Emi Bills, MD  REFERRING DIAG: M41.9 (ICD-10-CM) - Scoliosis, unspecified   Rationale for Evaluation and Treatment: Rehabilitation  THERAPY DIAG:  Muscle weakness (generalized)  Joint stiffness of spine  Other low back pain  Other idiopathic scoliosis, lumbar region  ONSET DATE: chronic          SUBJECTIVE:  SUBJECTIVE STATEMENT: Pt. Reports chronic low back pain everyday.  Pt. Uses MH and hot tub daily to manage symptoms.  Increase pain with prolonged standing/ driving and activity during the day.  Pt. Known well to PT clinic and discharged from PT earlier this year.  Pt. Is scheduled for major back surgery with Dr. Emi for spinal fusion after summer.   Pt. Reports L LE/calf/foot N/T and weakness (no falls reported).  Pt. Wants to return to walking with trekking poles and no back symptoms.  Pt. Returned to exercise with [employer] (personal trainer) 5 weeks ago and 1st session was difficult/ resulted in several days of soreness. Pt. Scheduled for [employer] ex. On Tuesday.  Pt. Having a pulmonary functional test next week for lung clearance for back surgery.   PERTINENT HISTORY:  Pt. Well known to PT clinic.    PAIN:  Are you having pain? Yes: NPRS scale: 3/10 Pain location: low back Pain description: aching/ constant numbness in L LE/ calf/ bottom of foot.  R hip to R foot pain will result in giving out with walking.  Aggravating factors: increase activity/ prolonged standing and walking Relieving factors: heat (hot tub use)  PRECAUTIONS: Back/ scoliosis  RED FLAGS: None   WEIGHT BEARING RESTRICTIONS: No  FALLS:  Has patient fallen in last 6 months? No  LIVING ENVIRONMENT: Lives with: lives alone Lives in:  House/apartment Has following equipment at home: Single point cane  OCCUPATION: Retired  PLOF: Independent  PATIENT GOALS: Increase activity during the day with less back pain.    NEXT MD VISIT: PRN  OBJECTIVE:  Note: Objective measures were completed at Evaluation unless otherwise noted.  DIAGNOSTIC FINDINGS:  See imaging  PATIENT SURVEYS:  MODI: 64% self-perceived extreme disability.    COGNITION: Overall cognitive status: Within functional limits for tasks assessed     SENSATION: L LE to foot N/T  MUSCLE LENGTH: Hamstrings: Right 26 deg; Left 70 deg  POSTURE: rounded shoulders and forward head.  Moderate scoliosis (L to R).    PALPATION: (+) tenderness in R low thoracic/lumbar spine (hypomobile).    LUMBAR ROM:   AROM eval  Flexion 60 deg.  Extension 12 deg.  Right lateral flexion 50% limited  Left lateral flexion 50% limited  Right rotation 50% limited  Left rotation 50% limited   (Blank rows = not tested)  LOWER EXTREMITY ROM:     Active  Right eval Left eval  Hip flexion 115 deg. 112 deg.  Hip extension    Hip abduction [facility]  Hip adduction    Hip internal rotation 18 deg. 28 deg.  Hip external rotation    Knee flexion 136 deg. 132 deg.   Knee extension 0 deg. 0 deg.   Ankle dorsiflexion    Ankle plantarflexion    Ankle inversion    Ankle eversion     (Blank rows = not tested)  LOWER EXTREMITY MMT:    MMT Right eval Left eval  Hip flexion 4 4  Hip extension    Hip abduction 4 4  Hip adduction    Hip internal rotation 3+ 3+  Hip external rotation 3+ 3+  Knee flexion 4+ 4+  Knee extension 4 5  Ankle dorsiflexion 4 4  Ankle plantarflexion    Ankle inversion    Ankle eversion     (Blank rows = not tested)  LUMBAR SPECIAL TESTS:  FABER test: Negative  FUNCTIONAL TESTS:  5 times sit to stand: TBD  GAIT: Distance walked: in  clinic Assistive device utilized: None Level of assistance: Modified independence Comments: Pt.  Ambulates with consistent recip. Gait pattern with no assistive devices.  Pt. Has rounded shoulder posture with limited arm swing.  Good heel strike/ clearance.    5xSTS: 41.5 seconds.    MODI: 62%  TREATMENT DATE:  04/27/24               Subjective:  Pt. reports mild low back pain at start of tx. Session but is unable to quantify her pain. Pt. reports that she has not had much sleep recently and has been taking Hydrocodone more often than I used to and using heat modalities daily to assist with pain management.   There.ex.:  Nustep L4 for 10 minutes B UE/LE to improve strength and cardiorespiratory endurance.  No increase c/o pain.  Discussed current back pain and preparation for upcoming surgery.   Nautilus Scapular Retractions, 30#verbal cues for TrA engagement to improve posture (30x)  Nautilus 40# Resisted lateral walking, 1 x 5 bilaterally   Nautilus 20# Paloff Press, 1x20 bilaterally   Standing Marches with taps to 6 inch step, 2x60 seconds   Supine TrA ex.:  20x TrA activation and hold with manual feedback Marches 1x20 Deadbugs 1x20   Manual: Supine LE/lumbar stretches/hip PROM- 12 min.  Focus on KTC/ hip IR/ ER/ hamstring    Seated STM to mid-thoracic/lumbar paraspinals with use of Hypervolt (no tenderness noted on this day)   MMT Right eval Left eval  Hip flexion 4- * 4- *  Hip extension    Hip abduction 4 4  Hip adduction    Hip internal rotation 3+ 3+  Hip external rotation 4- 4-  Knee flexion 4+ 4+  Knee extension 4+ 5  Ankle dorsiflexion 4+ 4+  Ankle plantarflexion    Ankle inversion    Ankle eversion     (Blank rows = not tested)  MODI: 68%  PATIENT EDUCATION:  Education details: Aquatic ex./ stretches/ HEP Person educated: Patient Education method: Medical illustrator Education comprehension: verbalized understanding and returned demonstration  HOME EXERCISE PROGRAM: Discussed ex. With [employer] -static floor, cervical rotn.,  piriformis stretch, hip rotn., pelvic tilts, hip rotn. With bent knee, floor press (lying hip adductor stretch), hip abduction, shoulder rotn. In supine, overhead extension (bent legs).     ASSESSMENT:  CLINICAL IMPRESSION:   Pt. Was introduced to resisted lateral walking on Nautilus machine on this day to challenge bilateral hip strength/endurance which patient was able to tolerate without an increase in pain. Pt. With slight discomfort during passive left hip ER/IR on this day with muscular tightness noted in low back paraspinals which was addressed with STM to the region. Reassessed LE MMTs on this day with only painful motion being bilateral hip flexion (see above). Pt. Scored a 68% on the MODI indicating self perceived severe disability.  Pt. Has attended and participated in 15 physical therapy treatment sessions to date and has seen some improvement in gross LE strength and pain levels in her low back. Pt. Is still limited by active lumbar ROM, decreased LE strength, abnormal gait/posture, and self perceived function which impacts her ability to ambulate or stand for prolonged periods of time, complete daily household tasks and transfers, or sleep at night without pain. Pt. To be discharged from physical therapy intervention at this time due to upcoming surgical intervention of thoracolumbar spine scheduled for next week.   OBJECTIVE IMPAIRMENTS: Abnormal gait, decreased activity tolerance, decreased endurance, decreased mobility, difficulty walking, decreased  ROM, decreased strength, impaired flexibility, improper body mechanics, postural dysfunction, and pain.   ACTIVITY LIMITATIONS: carrying, lifting, bending, standing, squatting, transfers, and locomotion level  PARTICIPATION LIMITATIONS: cleaning, laundry, driving, shopping, and community activity  PERSONAL FACTORS: Fitness and Past/current experiences are also affecting patient's functional outcome.   REHAB POTENTIAL: Good  CLINICAL  DECISION MAKING: Evolving/moderate complexity  EVALUATION COMPLEXITY: Moderate   GOALS: Goals reviewed with patient? Yes  LONG TERM GOALS: Target date: 04/20/24  Pt. Independent with HEP to progress LE muscle strength 1/2 muscle grade to improve pain-free mobility.   Baseline: see above  Goal status: Partially met  2.  Pt. Will decrease MODI to <40 to improve pain-free mobility.   Baseline: initial 64% self-perceived extreme disability.  7/31: 62%.  9/11: 68%  Goal status: Not met  3.  Pt. Will increase lumbar AROM 25% to improve functional mobility/ household tasks.  Baseline: See above Goal status: Not met  3.  Pt. Will report <5/10 back pain at worst with daily household tasks.   Baseline: 8/10 pain at worst.  7/10: varying levels of pain with daily tasks 9/11: varying levels of pain with daily tasks, 8/10 at worst. Goal status: Partially met   4.  Pt. Will increase B LE/core muscle strength to 4+/5 MMT to improve standing/walking tasks in preparation for spinal surgery.   Baseline: see above  Goal status: Not met  PLAN:  PT FREQUENCY: 1x/week  PT DURATION: 8 weeks  PLANNED INTERVENTIONS: 97110-Therapeutic exercises, 97530- Therapeutic activity, 97112- Neuromuscular re-education, 97535- Self Care, [id number]- Manual therapy, [phone]- Gait training, [phone]- Aquatic Therapy, Patient/Family education, Balance training, Stair training, Dry Needling, Joint mobilization, Cryotherapy, and Moist heat.  PLAN FOR NEXT SESSION:  Pt. to be discharged from physical therapy services at this time.   Curtistine Bracket, SPT  Ozell JAYSON Sero, PT, DPT # [phone] Physical Therapist - [facility] 04/27/2024, 8:13 AM

## 2024-04-28 ENCOUNTER — Encounter: Payer: Self-pay | Admitting: Physical Therapy

## 2024-05-01 DIAGNOSIS — M8580 Other specified disorders of bone density and structure, unspecified site: Secondary | ICD-10-CM | POA: Diagnosis not present

## 2024-05-01 DIAGNOSIS — G8929 Other chronic pain: Secondary | ICD-10-CM | POA: Diagnosis not present

## 2024-05-01 DIAGNOSIS — J984 Other disorders of lung: Secondary | ICD-10-CM | POA: Diagnosis not present

## 2024-05-01 DIAGNOSIS — Z4659 Encounter for fitting and adjustment of other gastrointestinal appliance and device: Secondary | ICD-10-CM | POA: Diagnosis not present

## 2024-05-01 DIAGNOSIS — M41125 Adolescent idiopathic scoliosis, thoracolumbar region: Secondary | ICD-10-CM | POA: Diagnosis not present

## 2024-05-01 DIAGNOSIS — M4805 Spinal stenosis, thoracolumbar region: Secondary | ICD-10-CM | POA: Diagnosis not present

## 2024-05-01 DIAGNOSIS — M545 Low back pain, unspecified: Secondary | ICD-10-CM | POA: Diagnosis not present

## 2024-05-01 DIAGNOSIS — E785 Hyperlipidemia, unspecified: Secondary | ICD-10-CM | POA: Diagnosis not present

## 2024-05-01 DIAGNOSIS — Z4682 Encounter for fitting and adjustment of non-vascular catheter: Secondary | ICD-10-CM | POA: Diagnosis not present

## 2024-05-01 DIAGNOSIS — G629 Polyneuropathy, unspecified: Secondary | ICD-10-CM | POA: Diagnosis not present

## 2024-05-01 DIAGNOSIS — M419 Scoliosis, unspecified: Secondary | ICD-10-CM | POA: Diagnosis not present

## 2024-05-01 DIAGNOSIS — R001 Bradycardia, unspecified: Secondary | ICD-10-CM | POA: Diagnosis not present

## 2024-05-02 DIAGNOSIS — M4185 Other forms of scoliosis, thoracolumbar region: Secondary | ICD-10-CM | POA: Diagnosis not present

## 2024-05-02 DIAGNOSIS — E785 Hyperlipidemia, unspecified: Secondary | ICD-10-CM | POA: Diagnosis not present

## 2024-05-02 DIAGNOSIS — M859 Disorder of bone density and structure, unspecified: Secondary | ICD-10-CM | POA: Diagnosis not present

## 2024-05-02 DIAGNOSIS — J849 Interstitial pulmonary disease, unspecified: Secondary | ICD-10-CM | POA: Diagnosis not present

## 2024-05-02 DIAGNOSIS — M4134 Thoracogenic scoliosis, thoracic region: Secondary | ICD-10-CM | POA: Diagnosis not present

## 2024-05-10 DIAGNOSIS — M4145 Neuromuscular scoliosis, thoracolumbar region: Secondary | ICD-10-CM | POA: Diagnosis not present

## 2024-05-10 DIAGNOSIS — Z981 Arthrodesis status: Secondary | ICD-10-CM | POA: Diagnosis not present

## 2024-05-11 DIAGNOSIS — Z743 Need for continuous supervision: Secondary | ICD-10-CM | POA: Diagnosis not present

## 2024-05-11 DIAGNOSIS — R278 Other lack of coordination: Secondary | ICD-10-CM | POA: Diagnosis not present

## 2024-05-11 DIAGNOSIS — M549 Dorsalgia, unspecified: Secondary | ICD-10-CM | POA: Diagnosis not present

## 2024-05-17 DIAGNOSIS — Z4889 Encounter for other specified surgical aftercare: Secondary | ICD-10-CM | POA: Diagnosis not present

## 2024-05-25 DIAGNOSIS — R262 Difficulty in walking, not elsewhere classified: Secondary | ICD-10-CM | POA: Diagnosis not present

## 2024-05-25 DIAGNOSIS — G8918 Other acute postprocedural pain: Secondary | ICD-10-CM | POA: Diagnosis not present

## 2024-05-25 DIAGNOSIS — Z981 Arthrodesis status: Secondary | ICD-10-CM | POA: Diagnosis not present

## 2024-05-25 DIAGNOSIS — Z4889 Encounter for other specified surgical aftercare: Secondary | ICD-10-CM | POA: Diagnosis not present

## 2024-05-25 DIAGNOSIS — M419 Scoliosis, unspecified: Secondary | ICD-10-CM | POA: Diagnosis not present

## 2024-05-25 DIAGNOSIS — M545 Low back pain, unspecified: Secondary | ICD-10-CM | POA: Diagnosis not present

## 2024-05-25 DIAGNOSIS — M48061 Spinal stenosis, lumbar region without neurogenic claudication: Secondary | ICD-10-CM | POA: Diagnosis not present

## 2024-05-25 DIAGNOSIS — M4325 Fusion of spine, thoracolumbar region: Secondary | ICD-10-CM | POA: Diagnosis not present

## 2024-05-30 DIAGNOSIS — M48061 Spinal stenosis, lumbar region without neurogenic claudication: Secondary | ICD-10-CM | POA: Diagnosis not present

## 2024-05-30 DIAGNOSIS — Z981 Arthrodesis status: Secondary | ICD-10-CM | POA: Diagnosis not present

## 2024-05-30 DIAGNOSIS — M6281 Muscle weakness (generalized): Secondary | ICD-10-CM | POA: Diagnosis not present

## 2024-05-30 DIAGNOSIS — R262 Difficulty in walking, not elsewhere classified: Secondary | ICD-10-CM | POA: Diagnosis not present

## 2024-05-30 DIAGNOSIS — Z4889 Encounter for other specified surgical aftercare: Secondary | ICD-10-CM | POA: Diagnosis not present

## 2024-05-30 DIAGNOSIS — M545 Low back pain, unspecified: Secondary | ICD-10-CM | POA: Diagnosis not present

## 2024-05-30 DIAGNOSIS — G8918 Other acute postprocedural pain: Secondary | ICD-10-CM | POA: Diagnosis not present

## 2024-05-30 DIAGNOSIS — M4325 Fusion of spine, thoracolumbar region: Secondary | ICD-10-CM | POA: Diagnosis not present

## 2024-05-31 DIAGNOSIS — M48061 Spinal stenosis, lumbar region without neurogenic claudication: Secondary | ICD-10-CM | POA: Diagnosis not present

## 2024-05-31 DIAGNOSIS — M4325 Fusion of spine, thoracolumbar region: Secondary | ICD-10-CM | POA: Diagnosis not present

## 2024-05-31 DIAGNOSIS — Z4889 Encounter for other specified surgical aftercare: Secondary | ICD-10-CM | POA: Diagnosis not present

## 2024-05-31 DIAGNOSIS — M419 Scoliosis, unspecified: Secondary | ICD-10-CM | POA: Diagnosis not present

## 2024-05-31 DIAGNOSIS — Z981 Arthrodesis status: Secondary | ICD-10-CM | POA: Diagnosis not present

## 2024-05-31 DIAGNOSIS — G8918 Other acute postprocedural pain: Secondary | ICD-10-CM | POA: Diagnosis not present

## 2024-05-31 DIAGNOSIS — M545 Low back pain, unspecified: Secondary | ICD-10-CM | POA: Diagnosis not present

## 2024-05-31 DIAGNOSIS — M6281 Muscle weakness (generalized): Secondary | ICD-10-CM | POA: Diagnosis not present

## 2024-06-01 DIAGNOSIS — M419 Scoliosis, unspecified: Secondary | ICD-10-CM | POA: Diagnosis not present

## 2024-06-01 DIAGNOSIS — M48061 Spinal stenosis, lumbar region without neurogenic claudication: Secondary | ICD-10-CM | POA: Diagnosis not present

## 2024-06-01 DIAGNOSIS — G8918 Other acute postprocedural pain: Secondary | ICD-10-CM | POA: Diagnosis not present

## 2024-06-01 DIAGNOSIS — M545 Low back pain, unspecified: Secondary | ICD-10-CM | POA: Diagnosis not present

## 2024-06-01 DIAGNOSIS — R262 Difficulty in walking, not elsewhere classified: Secondary | ICD-10-CM | POA: Diagnosis not present

## 2024-06-01 DIAGNOSIS — E785 Hyperlipidemia, unspecified: Secondary | ICD-10-CM | POA: Diagnosis not present

## 2024-06-01 DIAGNOSIS — M4325 Fusion of spine, thoracolumbar region: Secondary | ICD-10-CM | POA: Diagnosis not present

## 2024-06-01 DIAGNOSIS — Z4889 Encounter for other specified surgical aftercare: Secondary | ICD-10-CM | POA: Diagnosis not present

## 2024-06-01 DIAGNOSIS — M6281 Muscle weakness (generalized): Secondary | ICD-10-CM | POA: Diagnosis not present

## 2024-06-01 DIAGNOSIS — Z981 Arthrodesis status: Secondary | ICD-10-CM | POA: Diagnosis not present

## 2024-06-06 ENCOUNTER — Other Ambulatory Visit: Payer: Self-pay | Admitting: Internal Medicine

## 2024-06-06 NOTE — Progress Notes (Unsigned)
 Date:  06/06/2024   Name:  Jennifer Morrow   DOB:  16-Dec-1950   MRN:  [id number]   Chief Complaint: No chief complaint on file.  HPI  Review of Systems   Lab Results  Component Value Date   NA 144 10/14/2023   K 5.0 10/14/2023   CO2 24 10/14/2023   GLUCOSE 92 10/14/2023   BUN 23 10/14/2023   CREATININE 0.72 10/14/2023   CALCIUM 9.9 10/14/2023   EGFR 89 10/14/2023   Lab Results  Component Value Date   CHOL 219 (H) 10/14/2023   HDL 59 10/14/2023   LDLCALC 143 (H) 10/14/2023   TRIG 97 10/14/2023   CHOLHDL 3.7 10/14/2023   Lab Results  Component Value Date   TSH 2.540 09/23/2021   Lab Results  Component Value Date   HGBA1C 5.8 (H) 10/14/2023   Lab Results  Component Value Date   WBC 3.9 10/14/2023   HGB 14.3 10/14/2023   HCT 42.8 10/14/2023   MCV 93 10/14/2023   PLT 251 10/14/2023   Lab Results  Component Value Date   ALT 41 (H) 10/14/2023   AST 26 10/14/2023   ALKPHOS 72 10/14/2023   BILITOT 0.5 10/14/2023   Lab Results  Component Value Date   VD25OH 56.9 09/29/2022     Patient Active Problem List   Diagnosis Date Noted   Lumbar stenosis 04/03/2024   Elevated liver enzymes 06/14/2023   Osteoarthritis cervical spine 01/14/2022   Age-related osteoporosis without current pathological fracture 05/19/2021   Spondylosis of lumbar region without myelopathy or radiculopathy 05/19/2021   Degenerative disc disease, lumbar 05/19/2021   Scoliosis, unspecified 01/09/2015   Vitamin D  deficiency 01/08/2014   Mixed hyperlipidemia 01/08/2014    Allergies  Allergen Reactions   Celecoxib Other (See Comments)    Elevated LFT's   Oxaprozin Other (See Comments)    Elevated LFT's   Terbinafine Other (See Comments)    Elevated LFT's    Past Surgical History:  Procedure Laterality Date   CATARACT EXTRACTION W/PHACO Left 01/06/2018   Procedure: CATARACT EXTRACTION PHACO AND INTRAOCULAR LENS PLACEMENT (IOC);  Surgeon: Myrna Adine Anes, MD;  Location: [facility]  ORS;  Service: Ophthalmology;  Laterality: Left;  US  00:27.5 AP% 4.3 CDE 1.18 FLUID PACK LOT # [id number]   CATARACT EXTRACTION W/PHACO Right 02/10/2018   Procedure: CATARACT EXTRACTION PHACO AND INTRAOCULAR LENS PLACEMENT (IOC);  Surgeon: Myrna Adine Anes, MD;  Location: [facility] ORS;  Service: Ophthalmology;  Laterality: Right;  US  00:30.5 AP% 5.0 CDE 1.51  Fluid pack lot # [id number] H   EYE SURGERY  2019   cataract   FOOT X 2     HEEL SPUR EXCISION     TONSILLECTOMY      Social History   Tobacco Use   Smoking status: Never    Passive exposure: Past   Smokeless tobacco: Never  Vaping Use   Vaping status: Never Used  Substance Use Topics   Alcohol use: Yes    Alcohol/week: 1.0 standard drink of alcohol    Types: 1 Glasses of wine per week    Comment: Social, 1-2 drinks once per week   Drug use: Never     Medication list has been reviewed and updated.  No outpatient medications have been marked as taking for the 06/06/24 encounter (Orders Only) with Justus Leita DEL, MD.       10/14/2023    8:09 AM 07/19/2023    9:09 AM 09/29/2022    8:04 AM 09/09/2022  9:10 AM  GAD 7 : Generalized Anxiety Score  Nervous, Anxious, on Edge 1 0 0 0  Control/stop worrying 1 0 0 0  Worry too much - different things 0 0 0 0  Trouble relaxing 0 0 0 0  Restless 0 0 0 0  Easily annoyed or irritable 0 0 0 0  Afraid - awful might happen 1 0 0 0  Total GAD 7 Score 3 0 0 0  Anxiety Difficulty Somewhat difficult Not difficult at all Not difficult at all Not difficult at all       01/26/2024    2:56 PM 10/14/2023    8:09 AM 10/14/2023    8:08 AM  Depression screen PHQ 2/9  Decreased Interest 3 0 0  Down, Depressed, Hopeless 0 0 0  PHQ - 2 Score 3 0 0  Altered sleeping 1 1   Tired, decreased energy 2 2   Change in appetite 0 0   Feeling bad or failure about yourself  0 0   Trouble concentrating 0 0   Moving slowly or fidgety/restless 0 0   Suicidal thoughts 0 0   PHQ-9 Score 6 3    Difficult doing work/chores Extremely dIfficult Somewhat difficult     BP Readings from Last 3 Encounters:  04/13/24 122/74  10/14/23 133/79  10/14/23 122/78    Physical Exam  Wt Readings from Last 3 Encounters:  04/13/24 188 lb (85.3 kg)  01/26/24 185 lb (83.9 kg)  12/27/23 189 lb 6.4 oz (85.9 kg)    There were no vitals taken for this visit.  Assessment and Plan:  Problem List Items Addressed This Visit   None   No follow-ups on file.    Leita HILARIO Adie, MD [facility] [place]

## 2024-06-12 ENCOUNTER — Other Ambulatory Visit: Payer: Self-pay | Admitting: Internal Medicine

## 2024-06-12 ENCOUNTER — Inpatient Hospital Stay: Admitting: Student

## 2024-06-12 ENCOUNTER — Ambulatory Visit (INDEPENDENT_AMBULATORY_CARE_PROVIDER_SITE_OTHER): Admitting: Internal Medicine

## 2024-06-12 ENCOUNTER — Encounter: Payer: Self-pay | Admitting: Internal Medicine

## 2024-06-12 VITALS — BP 118/72 | HR 76 | Ht 69.5 in | Wt 190.0 lb

## 2024-06-12 DIAGNOSIS — E559 Vitamin D deficiency, unspecified: Secondary | ICD-10-CM

## 2024-06-12 DIAGNOSIS — M47816 Spondylosis without myelopathy or radiculopathy, lumbar region: Secondary | ICD-10-CM

## 2024-06-12 NOTE — Progress Notes (Signed)
 Date:  06/12/2024   Name:  Jennifer Morrow   DOB:  04/16/1951   MRN:  [id number]   Chief Complaint: Hospitalization Follow-up  Follow up after spine surgery and rehab stay.  Doing fairly after wards. Now back home and doing well.  Physical is supposed to be starting soon.  OT was there last week. Pain control is adequate; having more pain in the left foot and ankle - likely a result of spinal manipulation during the procedure. She has follow up with surgery tomorrow.  Recommend that she continue iron rich foods and have CBC rechecked.   HPI  Review of Systems  Constitutional:  Negative for chills, fatigue and fever.  Respiratory:  Negative for chest tightness and shortness of breath.   Cardiovascular:  Positive for leg swelling. Negative for chest pain.  Gastrointestinal:  Negative for abdominal pain, constipation and diarrhea.  Musculoskeletal:  Positive for arthralgias and gait problem. Negative for myalgias.  Psychiatric/Behavioral:  Negative for dysphoric mood and sleep disturbance. The patient is not nervous/anxious.      Lab Results  Component Value Date   NA 144 10/14/2023   K 5.0 10/14/2023   CO2 24 10/14/2023   GLUCOSE 92 10/14/2023   BUN 23 10/14/2023   CREATININE 0.72 10/14/2023   CALCIUM 9.9 10/14/2023   EGFR 89 10/14/2023   Lab Results  Component Value Date   CHOL 219 (H) 10/14/2023   HDL 59 10/14/2023   LDLCALC 143 (H) 10/14/2023   TRIG 97 10/14/2023   CHOLHDL 3.7 10/14/2023   Lab Results  Component Value Date   TSH 2.540 09/23/2021   Lab Results  Component Value Date   HGBA1C 5.8 (H) 10/14/2023   Lab Results  Component Value Date   WBC 3.9 10/14/2023   HGB 14.3 10/14/2023   HCT 42.8 10/14/2023   MCV 93 10/14/2023   PLT 251 10/14/2023   Lab Results  Component Value Date   ALT 41 (H) 10/14/2023   AST 26 10/14/2023   ALKPHOS 72 10/14/2023   BILITOT 0.5 10/14/2023   Lab Results  Component Value Date   VD25OH 56.9 09/29/2022      Patient Active Problem List   Diagnosis Date Noted   Elevated liver enzymes 06/14/2023   Age-related osteoporosis without current pathological fracture 05/19/2021   Spondylosis of lumbar region without myelopathy or radiculopathy 05/19/2021   Scoliosis, unspecified 01/09/2015   Vitamin D  deficiency 01/08/2014   Mixed hyperlipidemia 01/08/2014    Allergies  Allergen Reactions   Celecoxib Other (See Comments)    Elevated LFT's   Oxaprozin Other (See Comments)    Elevated LFT's   Terbinafine Other (See Comments)    Elevated LFT's    Past Surgical History:  Procedure Laterality Date   CATARACT EXTRACTION W/PHACO Left 01/06/2018   Procedure: CATARACT EXTRACTION PHACO AND INTRAOCULAR LENS PLACEMENT (IOC);  Surgeon: Myrna Adine Anes, MD;  Location: [facility] ORS;  Service: Ophthalmology;  Laterality: Left;  US  00:27.5 AP% 4.3 CDE 1.18 FLUID PACK LOT # [id number]   CATARACT EXTRACTION W/PHACO Right 02/10/2018   Procedure: CATARACT EXTRACTION PHACO AND INTRAOCULAR LENS PLACEMENT (IOC);  Surgeon: Myrna Adine Anes, MD;  Location: [facility] ORS;  Service: Ophthalmology;  Laterality: Right;  US  00:30.5 AP% 5.0 CDE 1.51  Fluid pack lot # [id number] H   EYE SURGERY  2019   cataract   FOOT X 2     HEEL SPUR EXCISION     LUMBAR SPINE SURGERY  05/01/2024   thoracolumbar  fusioin   TONSILLECTOMY      Social History   Tobacco Use   Smoking status: Never    Passive exposure: Past   Smokeless tobacco: Never  Vaping Use   Vaping status: Never Used  Substance Use Topics   Alcohol use: Not Currently    Alcohol/week: 1.0 standard drink of alcohol    Types: 1 Glasses of wine per week    Comment: Social   Drug use: Never     Medication list has been reviewed and updated.  Current Meds  Medication Sig   alendronate (FOSAMAX) 70 MG tablet Take 70 mg by mouth once a week.   Calcium-Vitamin D -Vitamin K 650-12.5-40 MG-MCG-MCG CHEW    cetirizine (ZYRTEC) 10 MG tablet Take 10 mg by mouth daily as  needed for allergies.   gabapentin (NEURONTIN) 600 MG tablet Take 600 mg by mouth 3 (three) times daily.   HYDROcodone-acetaminophen (NORCO/VICODIN) 5-325 MG tablet Take by mouth as needed. Pt taking 5 times per day   meloxicam (MOBIC) 15 MG tablet Take 15 mg by mouth as needed for pain.   methocarbamol (ROBAXIN) 500 MG tablet Take 500 mg by mouth every 8 (eight) hours as needed.   Oxycodone HCl 10 MG TABS Take 10 mg by mouth every 4 (four) hours as needed.   pantoprazole (PROTONIX) 40 MG tablet Take 40 mg by mouth daily.   polyethylene glycol (MIRALAX / GLYCOLAX) 17 g packet Take 17 g by mouth daily.   senna-docusate (SENOKOT-S) 8.6-50 MG tablet Take 2 tablets by mouth daily.   tiZANidine (ZANAFLEX) 2 MG tablet Take 2 mg by mouth daily as needed for muscle pain.   triamcinolone  (NASACORT  ALLERGY 24HR) 55 MCG/ACT AERO nasal inhaler Place 2 sprays into the nose 2 (two) times daily.   Vitamin D , Ergocalciferol , (DRISDOL) 1.25 MG (50000 UNIT) CAPS capsule Take 50,000 Units by mouth every 7 (seven) days.       06/12/2024    1:12 PM 10/14/2023    8:09 AM 07/19/2023    9:09 AM 09/29/2022    8:04 AM  GAD 7 : Generalized Anxiety Score  Nervous, Anxious, on Edge 1 1 0 0  Control/stop worrying 1 1 0 0  Worry too much - different things 1 0 0 0  Trouble relaxing 0 0 0 0  Restless 0 0 0 0  Easily annoyed or irritable 0 0 0 0  Afraid - awful might happen 0 1 0 0  Total GAD 7 Score 3 3 0 0  Anxiety Difficulty Not difficult at all Somewhat difficult Not difficult at all Not difficult at all       06/12/2024    1:12 PM 01/26/2024    2:56 PM 10/14/2023    8:09 AM  Depression screen PHQ 2/9  Decreased Interest 1 3 0  Down, Depressed, Hopeless 0 0 0  PHQ - 2 Score 1 3 0  Altered sleeping 1 1 1   Tired, decreased energy 1 2 2   Change in appetite 0 0 0  Feeling bad or failure about yourself  0 0 0  Trouble concentrating 0 0 0  Moving slowly or fidgety/restless 0 0 0  Suicidal thoughts 0 0 0   PHQ-9 Score 3 6 3   Difficult doing work/chores Not difficult at all Extremely dIfficult Somewhat difficult    BP Readings from Last 3 Encounters:  06/12/24 118/72  04/13/24 122/74  10/14/23 133/79    Physical Exam Constitutional:      Appearance: Normal appearance.  Cardiovascular:     Rate and Rhythm: Normal rate and regular rhythm.  Pulmonary:     Effort: Pulmonary effort is normal.     Breath sounds: No wheezing or rhonchi.  Musculoskeletal:       Arms:     Cervical back: Normal range of motion.     Comments: Surgical incision healing very well - few steristips still intact, minimal eschar with no bleeding, redness or swelling.  Lymphadenopathy:     Cervical: No cervical adenopathy.  Neurological:     Mental Status: She is alert.     Wt Readings from Last 3 Encounters:  06/12/24 190 lb (86.2 kg)  04/13/24 188 lb (85.3 kg)  01/26/24 185 lb (83.9 kg)    BP 118/72   Pulse 76   Ht 5' 9.5 (1.765 m)   Wt 190 lb (86.2 kg)   SpO2 96%   BMI 27.66 kg/m   Assessment and Plan:  Problem List Items Addressed This Visit       Unprioritized   Vitamin D  deficiency   Now on Alendronate and calcium and vitamin D  supplements      Spondylosis of lumbar region without myelopathy or radiculopathy - Primary   S/p fusion of thoracolumbar spine.  T4-S1 fusion with intramedullary rods. Has done well in Rehab and now home.  Recommend she call HH to get PT started. Pain medications as needed. Mild post op anemia noted - recommend recheck tomorrow at surgery follow up. Otherwise continue iron rich foods.       No follow-ups on file.    Leita HILARIO Adie, MD [facility] [place]

## 2024-06-12 NOTE — Assessment & Plan Note (Signed)
 Now on Alendronate and calcium and vitamin D  supplements

## 2024-06-12 NOTE — Progress Notes (Unsigned)
 Date:  06/12/2024   Name:  Jennifer Morrow   DOB:  01/18/1951   MRN:  [id number]   Chief Complaint: No chief complaint on file.  HPI  Review of Systems   Lab Results  Component Value Date   NA 144 10/14/2023   K 5.0 10/14/2023   CO2 24 10/14/2023   GLUCOSE 92 10/14/2023   BUN 23 10/14/2023   CREATININE 0.72 10/14/2023   CALCIUM 9.9 10/14/2023   EGFR 89 10/14/2023   Lab Results  Component Value Date   CHOL 219 (H) 10/14/2023   HDL 59 10/14/2023   LDLCALC 143 (H) 10/14/2023   TRIG 97 10/14/2023   CHOLHDL 3.7 10/14/2023   Lab Results  Component Value Date   TSH 2.540 09/23/2021   Lab Results  Component Value Date   HGBA1C 5.8 (H) 10/14/2023   Lab Results  Component Value Date   WBC 3.9 10/14/2023   HGB 14.3 10/14/2023   HCT 42.8 10/14/2023   MCV 93 10/14/2023   PLT 251 10/14/2023   Lab Results  Component Value Date   ALT 41 (H) 10/14/2023   AST 26 10/14/2023   ALKPHOS 72 10/14/2023   BILITOT 0.5 10/14/2023   Lab Results  Component Value Date   VD25OH 56.9 09/29/2022     Patient Active Problem List   Diagnosis Date Noted   Lumbar stenosis 04/03/2024   Elevated liver enzymes 06/14/2023   Osteoarthritis cervical spine 01/14/2022   Age-related osteoporosis without current pathological fracture 05/19/2021   Spondylosis of lumbar region without myelopathy or radiculopathy 05/19/2021   Degenerative disc disease, lumbar 05/19/2021   Scoliosis, unspecified 01/09/2015   Vitamin D  deficiency 01/08/2014   Mixed hyperlipidemia 01/08/2014    Allergies  Allergen Reactions   Celecoxib Other (See Comments)    Elevated LFT's   Oxaprozin Other (See Comments)    Elevated LFT's   Terbinafine Other (See Comments)    Elevated LFT's    Past Surgical History:  Procedure Laterality Date   CATARACT EXTRACTION W/PHACO Left 01/06/2018   Procedure: CATARACT EXTRACTION PHACO AND INTRAOCULAR LENS PLACEMENT (IOC);  Surgeon: Myrna Adine Anes, MD;  Location: [facility]  ORS;  Service: Ophthalmology;  Laterality: Left;  US  00:27.5 AP% 4.3 CDE 1.18 FLUID PACK LOT # [id number]   CATARACT EXTRACTION W/PHACO Right 02/10/2018   Procedure: CATARACT EXTRACTION PHACO AND INTRAOCULAR LENS PLACEMENT (IOC);  Surgeon: Myrna Adine Anes, MD;  Location: [facility] ORS;  Service: Ophthalmology;  Laterality: Right;  US  00:30.5 AP% 5.0 CDE 1.51  Fluid pack lot # [id number]   EYE SURGERY  2019   cataract   FOOT X 2     HEEL SPUR EXCISION     TONSILLECTOMY      Social History   Tobacco Use   Smoking status: Never    Passive exposure: Past   Smokeless tobacco: Never  Vaping Use   Vaping status: Never Used  Substance Use Topics   Alcohol use: Yes    Alcohol/week: 1.0 standard drink of alcohol    Types: 1 Glasses of wine per week    Comment: Social, 1-2 drinks once per week   Drug use: Never     Medication list has been reviewed and updated.  No outpatient medications have been marked as taking for the 06/12/24 encounter (Orders Only) with Justus Leita DEL, MD.       10/14/2023    8:09 AM 07/19/2023    9:09 AM 09/29/2022    8:04 AM 09/09/2022  9:10 AM  GAD 7 : Generalized Anxiety Score  Nervous, Anxious, on Edge 1 0 0 0  Control/stop worrying 1 0 0 0  Worry too much - different things 0 0 0 0  Trouble relaxing 0 0 0 0  Restless 0 0 0 0  Easily annoyed or irritable 0 0 0 0  Afraid - awful might happen 1 0 0 0  Total GAD 7 Score 3 0 0 0  Anxiety Difficulty Somewhat difficult Not difficult at all Not difficult at all Not difficult at all       01/26/2024    2:56 PM 10/14/2023    8:09 AM 10/14/2023    8:08 AM  Depression screen PHQ 2/9  Decreased Interest 3 0 0  Down, Depressed, Hopeless 0 0 0  PHQ - 2 Score 3 0 0  Altered sleeping 1 1   Tired, decreased energy 2 2   Change in appetite 0 0   Feeling bad or failure about yourself  0 0   Trouble concentrating 0 0   Moving slowly or fidgety/restless 0 0   Suicidal thoughts 0 0   PHQ-9 Score 6 3    Difficult doing work/chores Extremely dIfficult Somewhat difficult     BP Readings from Last 3 Encounters:  04/13/24 122/74  10/14/23 133/79  10/14/23 122/78    Physical Exam  Wt Readings from Last 3 Encounters:  04/13/24 188 lb (85.3 kg)  01/26/24 185 lb (83.9 kg)  12/27/23 189 lb 6.4 oz (85.9 kg)    There were no vitals taken for this visit.  Assessment and Plan:  Problem List Items Addressed This Visit   None   No follow-ups on file.    Leita HILARIO Adie, MD [facility] [place]

## 2024-06-12 NOTE — Assessment & Plan Note (Addendum)
 S/p fusion of thoracolumbar spine.  T4-S1 fusion with intramedullary rods. Has done well in Rehab and now home.  Recommend she call HH to get PT started. Pain medications as needed. Mild post op anemia noted - recommend recheck tomorrow at surgery follow up. Otherwise continue iron rich foods.

## 2024-06-13 DIAGNOSIS — M4150 Other secondary scoliosis, site unspecified: Secondary | ICD-10-CM | POA: Diagnosis not present

## 2024-06-13 DIAGNOSIS — M419 Scoliosis, unspecified: Secondary | ICD-10-CM | POA: Diagnosis not present

## 2024-06-13 DIAGNOSIS — M545 Low back pain, unspecified: Secondary | ICD-10-CM | POA: Diagnosis not present

## 2024-06-13 DIAGNOSIS — Z9889 Other specified postprocedural states: Secondary | ICD-10-CM | POA: Diagnosis not present

## 2024-06-13 DIAGNOSIS — M5136 Other intervertebral disc degeneration, lumbar region with discogenic back pain only: Secondary | ICD-10-CM | POA: Diagnosis not present

## 2024-06-13 DIAGNOSIS — M5030 Other cervical disc degeneration, unspecified cervical region: Secondary | ICD-10-CM | POA: Diagnosis not present

## 2024-06-13 DIAGNOSIS — M5134 Other intervertebral disc degeneration, thoracic region: Secondary | ICD-10-CM | POA: Diagnosis not present

## 2024-06-27 DIAGNOSIS — G8929 Other chronic pain: Secondary | ICD-10-CM | POA: Diagnosis not present

## 2024-06-27 DIAGNOSIS — M8580 Other specified disorders of bone density and structure, unspecified site: Secondary | ICD-10-CM | POA: Diagnosis not present

## 2024-06-27 DIAGNOSIS — M6281 Muscle weakness (generalized): Secondary | ICD-10-CM | POA: Diagnosis not present

## 2024-06-27 DIAGNOSIS — M419 Scoliosis, unspecified: Secondary | ICD-10-CM | POA: Diagnosis not present

## 2024-06-27 DIAGNOSIS — M48061 Spinal stenosis, lumbar region without neurogenic claudication: Secondary | ICD-10-CM | POA: Diagnosis not present

## 2024-06-27 DIAGNOSIS — E559 Vitamin D deficiency, unspecified: Secondary | ICD-10-CM | POA: Diagnosis not present

## 2024-06-27 DIAGNOSIS — R32 Unspecified urinary incontinence: Secondary | ICD-10-CM | POA: Diagnosis not present

## 2024-06-27 DIAGNOSIS — Z981 Arthrodesis status: Secondary | ICD-10-CM | POA: Diagnosis not present

## 2024-06-27 DIAGNOSIS — Z79891 Long term (current) use of opiate analgesic: Secondary | ICD-10-CM | POA: Diagnosis not present

## 2024-06-27 DIAGNOSIS — G8918 Other acute postprocedural pain: Secondary | ICD-10-CM | POA: Diagnosis not present

## 2024-06-27 DIAGNOSIS — E785 Hyperlipidemia, unspecified: Secondary | ICD-10-CM | POA: Diagnosis not present

## 2024-06-27 DIAGNOSIS — Z4789 Encounter for other orthopedic aftercare: Secondary | ICD-10-CM | POA: Diagnosis not present

## 2024-06-30 ENCOUNTER — Telehealth: Payer: Self-pay

## 2024-06-30 NOTE — Telephone Encounter (Signed)
 Copied from CRM #[id number]. Topic: Clinical - Prescription Issue >> Jun 30, 2024  8:06 AM Emylou G wrote: Reason for CRM: Pls call patient looking to fill gabapentin (NEURONTIN) 600 MG tablet but she need to be seen?  She says she will be out on Monday?  Thinking she may need to step down on MG..  Pls call her asap today.SABRA Unsure next steps..( both Neurosurgeon and podiatry said they no longer manage )

## 2024-07-03 ENCOUNTER — Encounter: Payer: Self-pay | Admitting: Internal Medicine

## 2024-07-03 ENCOUNTER — Ambulatory Visit (INDEPENDENT_AMBULATORY_CARE_PROVIDER_SITE_OTHER): Admitting: Internal Medicine

## 2024-07-03 VITALS — BP 122/64 | HR 79 | Ht 69.5 in | Wt 190.0 lb

## 2024-07-03 DIAGNOSIS — M47816 Spondylosis without myelopathy or radiculopathy, lumbar region: Secondary | ICD-10-CM

## 2024-07-03 MED ORDER — GABAPENTIN 300 MG PO CAPS: 600.0000 mg | ORAL_CAPSULE | Freq: Three times a day (TID) | ORAL | 1 refills | Status: DC

## 2024-07-03 NOTE — Progress Notes (Signed)
 Date:  07/03/2024   Name:  Jennifer Morrow   DOB:  Dec 19, 1950   MRN:  [id number]   Chief Complaint: Back Pain (Back pain follow up- Discuss prescribing Gabapentin and possibly decreasing dose in future post back surgery.)  Back Pain This is a chronic problem. The problem has been gradually improving since onset. The pain is present in the lumbar spine. The pain is moderate. Pertinent negatives include no chest pain or fever. Treatments tried: s/p surgery -  Pain management was prescribing gabapentin 800 mg tid before surgery.  She continued the same dose in rehab and it was refilled once at discharge.  Now she is trying to get refills but pain management and surgery have both declined to continue the Rx. She would like to try to reduce the dose somewhat but is unsure how to do that.   Review of Systems  Constitutional:  Negative for chills, fatigue and fever.  Respiratory:  Negative for chest tightness and shortness of breath.   Cardiovascular:  Negative for chest pain.  Musculoskeletal:  Positive for back pain and gait problem.  Psychiatric/Behavioral:  Positive for sleep disturbance. Negative for dysphoric mood. The patient is not nervous/anxious.      Lab Results  Component Value Date   NA 144 10/14/2023   K 5.0 10/14/2023   CO2 24 10/14/2023   GLUCOSE 92 10/14/2023   BUN 23 10/14/2023   CREATININE 0.72 10/14/2023   CALCIUM 9.9 10/14/2023   EGFR 89 10/14/2023   Lab Results  Component Value Date   CHOL 219 (H) 10/14/2023   HDL 59 10/14/2023   LDLCALC 143 (H) 10/14/2023   TRIG 97 10/14/2023   CHOLHDL 3.7 10/14/2023   Lab Results  Component Value Date   TSH 2.540 09/23/2021   Lab Results  Component Value Date   HGBA1C 5.8 (H) 10/14/2023   Lab Results  Component Value Date   WBC 3.9 10/14/2023   HGB 14.3 10/14/2023   HCT 42.8 10/14/2023   MCV 93 10/14/2023   PLT 251 10/14/2023   Lab Results  Component Value Date   ALT 41 (H) 10/14/2023   AST 26 10/14/2023    ALKPHOS 72 10/14/2023   BILITOT 0.5 10/14/2023   Lab Results  Component Value Date   VD25OH 56.9 09/29/2022     Patient Active Problem List   Diagnosis Date Noted   Elevated liver enzymes 06/14/2023   Age-related osteoporosis without current pathological fracture 05/19/2021   Spondylosis of lumbar region without myelopathy or radiculopathy 05/19/2021   Scoliosis, unspecified 01/09/2015   Vitamin D  deficiency 01/08/2014   Mixed hyperlipidemia 01/08/2014    Allergies  Allergen Reactions   Celecoxib Other (See Comments)    Elevated LFT's   Oxaprozin Other (See Comments)    Elevated LFT's   Terbinafine Other (See Comments)    Elevated LFT's    Past Surgical History:  Procedure Laterality Date   CATARACT EXTRACTION W/PHACO Left 01/06/2018   Procedure: CATARACT EXTRACTION PHACO AND INTRAOCULAR LENS PLACEMENT (IOC);  Surgeon: Myrna Adine Anes, MD;  Location: [facility] ORS;  Service: Ophthalmology;  Laterality: Left;  US  00:27.5 AP% 4.3 CDE 1.18 FLUID PACK LOT # [id number]   CATARACT EXTRACTION W/PHACO Right 02/10/2018   Procedure: CATARACT EXTRACTION PHACO AND INTRAOCULAR LENS PLACEMENT (IOC);  Surgeon: Myrna Adine Anes, MD;  Location: [facility] ORS;  Service: Ophthalmology;  Laterality: Right;  US  00:30.5 AP% 5.0 CDE 1.51  Fluid pack lot # [id number]   EYE SURGERY  2019  cataract   FOOT X 2     HEEL SPUR EXCISION     LUMBAR SPINE SURGERY  05/01/2024   thoracolumbar fusioin   TONSILLECTOMY      Social History   Tobacco Use   Smoking status: Never    Passive exposure: Past   Smokeless tobacco: Never  Vaping Use   Vaping status: Never Used  Substance Use Topics   Alcohol use: Not Currently    Alcohol/week: 1.0 standard drink of alcohol    Types: 1 Glasses of wine per week    Comment: Social   Drug use: Never     Medication list has been reviewed and updated.  Current Meds  Medication Sig   alendronate (FOSAMAX) 70 MG tablet Take 70 mg by mouth once a week.    Calcium-Vitamin D -Vitamin K 650-12.5-40 MG-MCG-MCG CHEW    cetirizine (ZYRTEC) 10 MG tablet Take 10 mg by mouth daily as needed for allergies.   gabapentin (NEURONTIN) 300 MG capsule Take 2 capsules (600 mg total) by mouth 3 (three) times daily.   HYDROcodone-acetaminophen (NORCO/VICODIN) 5-325 MG tablet Take by mouth as needed. Pt taking 5 times per day   meloxicam (MOBIC) 15 MG tablet Take 15 mg by mouth as needed for pain.   Oxycodone HCl 10 MG TABS Take 10 mg by mouth every 4 (four) hours as needed.   tiZANidine (ZANAFLEX) 2 MG tablet Take 2 mg by mouth daily as needed for muscle pain.   triamcinolone  (NASACORT  ALLERGY 24HR) 55 MCG/ACT AERO nasal inhaler Place 2 sprays into the nose 2 (two) times daily.   Vitamin D , Ergocalciferol , (DRISDOL) 1.25 MG (50000 UNIT) CAPS capsule Take 50,000 Units by mouth every 7 (seven) days.   [DISCONTINUED] gabapentin (NEURONTIN) 600 MG tablet Take 600 mg by mouth 3 (three) times daily. (Patient taking differently: Take 800 mg by mouth 3 (three) times daily.)       07/03/2024    1:59 PM 06/12/2024    1:12 PM 10/14/2023    8:09 AM 07/19/2023    9:09 AM  GAD 7 : Generalized Anxiety Score  Nervous, Anxious, on Edge 1 1 1  0  Control/stop worrying 1 1 1  0  Worry too much - different things 1 1 0 0  Trouble relaxing 0 0 0 0  Restless 0 0 0 0  Easily annoyed or irritable 0 0 0 0  Afraid - awful might happen 0 0 1 0  Total GAD 7 Score 3 3 3  0  Anxiety Difficulty Not difficult at all Not difficult at all Somewhat difficult Not difficult at all       07/03/2024    1:59 PM 06/12/2024    1:12 PM 01/26/2024    2:56 PM  Depression screen PHQ 2/9  Decreased Interest 1 1 3   Down, Depressed, Hopeless 0 0 0  PHQ - 2 Score 1 1 3   Altered sleeping 1 1 1   Tired, decreased energy 1 1 2   Change in appetite 0 0 0  Feeling bad or failure about yourself  0 0 0  Trouble concentrating 0 0 0  Moving slowly or fidgety/restless 0 0 0  Suicidal thoughts 0 0 0  PHQ-9  Score 3 3  6    Difficult doing work/chores Not difficult at all Not difficult at all Extremely dIfficult     Data saved with a previous flowsheet row definition    BP Readings from Last 3 Encounters:  07/03/24 122/64  06/12/24 118/72  04/13/24 122/74  Physical Exam Constitutional:      Appearance: Normal appearance.  Cardiovascular:     Rate and Rhythm: Normal rate and regular rhythm.     Heart sounds: No murmur heard. Pulmonary:     Effort: Pulmonary effort is normal. No respiratory distress.     Breath sounds: No wheezing or rhonchi.  Skin:    General: Skin is warm and dry.  Neurological:     Mental Status: She is alert.     Gait: Gait abnormal (uses a cane).     Wt Readings from Last 3 Encounters:  07/03/24 190 lb (86.2 kg)  06/12/24 190 lb (86.2 kg)  04/13/24 188 lb (85.3 kg)    BP 122/64   Pulse 79   Ht 5' 9.5 (1.765 m)   Wt 190 lb (86.2 kg)   SpO2 96%   BMI 27.66 kg/m   Assessment and Plan:  Problem List Items Addressed This Visit       Unprioritized   Spondylosis of lumbar region without myelopathy or radiculopathy - Primary   Improving since surgery but released from pain management. Will refill gabapentin at lower dose of 600 mg tid.  She can then try to taper down gradually (instructions given).       Relevant Medications   gabapentin (NEURONTIN) 300 MG capsule    No follow-ups on file.    Leita HILARIO Adie, MD [facility] [place]

## 2024-07-03 NOTE — Assessment & Plan Note (Signed)
 Improving since surgery but released from pain management. Will refill gabapentin at lower dose of 600 mg tid.  She can then try to taper down gradually (instructions given).

## 2024-07-05 ENCOUNTER — Other Ambulatory Visit: Payer: Self-pay | Admitting: Internal Medicine

## 2024-07-05 ENCOUNTER — Telehealth: Payer: Self-pay | Admitting: Internal Medicine

## 2024-07-05 DIAGNOSIS — M47816 Spondylosis without myelopathy or radiculopathy, lumbar region: Secondary | ICD-10-CM

## 2024-07-05 MED ORDER — GABAPENTIN 400 MG PO CAPS: 800.0000 mg | ORAL_CAPSULE | Freq: Three times a day (TID) | ORAL | 0 refills | Status: DC

## 2024-07-05 NOTE — Progress Notes (Unsigned)
 Date:  07/05/2024   Name:  Jennifer Morrow   DOB:  08-24-1950   MRN:  [id number]   Chief Complaint: No chief complaint on file.  HPI  Review of Systems   Lab Results  Component Value Date   NA 144 10/14/2023   K 5.0 10/14/2023   CO2 24 10/14/2023   GLUCOSE 92 10/14/2023   BUN 23 10/14/2023   CREATININE 0.72 10/14/2023   CALCIUM 9.9 10/14/2023   EGFR 89 10/14/2023   Lab Results  Component Value Date   CHOL 219 (H) 10/14/2023   HDL 59 10/14/2023   LDLCALC 143 (H) 10/14/2023   TRIG 97 10/14/2023   CHOLHDL 3.7 10/14/2023   Lab Results  Component Value Date   TSH 2.540 09/23/2021   Lab Results  Component Value Date   HGBA1C 5.8 (H) 10/14/2023   Lab Results  Component Value Date   WBC 3.9 10/14/2023   HGB 14.3 10/14/2023   HCT 42.8 10/14/2023   MCV 93 10/14/2023   PLT 251 10/14/2023   Lab Results  Component Value Date   ALT 41 (H) 10/14/2023   AST 26 10/14/2023   ALKPHOS 72 10/14/2023   BILITOT 0.5 10/14/2023   Lab Results  Component Value Date   VD25OH 56.9 09/29/2022     Patient Active Problem List   Diagnosis Date Noted   Elevated liver enzymes 06/14/2023   Age-related osteoporosis without current pathological fracture 05/19/2021   Spondylosis of lumbar region without myelopathy or radiculopathy 05/19/2021   Scoliosis, unspecified 01/09/2015   Vitamin D  deficiency 01/08/2014   Mixed hyperlipidemia 01/08/2014    Allergies  Allergen Reactions   Celecoxib Other (See Comments)    Elevated LFT's   Oxaprozin Other (See Comments)    Elevated LFT's   Terbinafine Other (See Comments)    Elevated LFT's    Past Surgical History:  Procedure Laterality Date   CATARACT EXTRACTION W/PHACO Left 01/06/2018   Procedure: CATARACT EXTRACTION PHACO AND INTRAOCULAR LENS PLACEMENT (IOC);  Surgeon: Myrna Adine Anes, MD;  Location: [facility] ORS;  Service: Ophthalmology;  Laterality: Left;  US  00:27.5 AP% 4.3 CDE 1.18 FLUID PACK LOT # [id number]   CATARACT  EXTRACTION W/PHACO Right 02/10/2018   Procedure: CATARACT EXTRACTION PHACO AND INTRAOCULAR LENS PLACEMENT (IOC);  Surgeon: Myrna Adine Anes, MD;  Location: [facility] ORS;  Service: Ophthalmology;  Laterality: Right;  US  00:30.5 AP% 5.0 CDE 1.51  Fluid pack lot # [id number] H   EYE SURGERY  2019   cataract   FOOT X 2     HEEL SPUR EXCISION     LUMBAR SPINE SURGERY  05/01/2024   thoracolumbar fusioin   TONSILLECTOMY      Social History   Tobacco Use   Smoking status: Never    Passive exposure: Past   Smokeless tobacco: Never  Vaping Use   Vaping status: Never Used  Substance Use Topics   Alcohol use: Not Currently    Alcohol/week: 1.0 standard drink of alcohol    Types: 1 Glasses of wine per week    Comment: Social   Drug use: Never     Medication list has been reviewed and updated.  No outpatient medications have been marked as taking for the 07/05/24 encounter (Orders Only) with Justus Leita DEL, MD.       07/03/2024    1:59 PM 06/12/2024    1:12 PM 10/14/2023    8:09 AM 07/19/2023    9:09 AM  GAD 7 : Generalized Anxiety  Score  Nervous, Anxious, on Edge 1 1 1  0  Control/stop worrying 1 1 1  0  Worry too much - different things 1 1 0 0  Trouble relaxing 0 0 0 0  Restless 0 0 0 0  Easily annoyed or irritable 0 0 0 0  Afraid - awful might happen 0 0 1 0  Total GAD 7 Score 3 3 3  0  Anxiety Difficulty Not difficult at all Not difficult at all Somewhat difficult Not difficult at all       07/03/2024    1:59 PM 06/12/2024    1:12 PM 01/26/2024    2:56 PM  Depression screen PHQ 2/9  Decreased Interest 1 1 3   Down, Depressed, Hopeless 0 0 0  PHQ - 2 Score 1 1 3   Altered sleeping 1 1 1   Tired, decreased energy 1 1 2   Change in appetite 0 0 0  Feeling bad or failure about yourself  0 0 0  Trouble concentrating 0 0 0  Moving slowly or fidgety/restless 0 0 0  Suicidal thoughts 0 0 0  PHQ-9 Score 3 3  6    Difficult doing work/chores Not difficult at all Not difficult at  all Extremely dIfficult     Data saved with a previous flowsheet row definition    BP Readings from Last 3 Encounters:  07/03/24 122/64  06/12/24 118/72  04/13/24 122/74    Physical Exam  Wt Readings from Last 3 Encounters:  07/03/24 190 lb (86.2 kg)  06/12/24 190 lb (86.2 kg)  04/13/24 188 lb (85.3 kg)    There were no vitals taken for this visit.  Assessment and Plan:  Problem List Items Addressed This Visit   None   No follow-ups on file.    Leita HILARIO Adie, MD [facility] [place]

## 2024-07-05 NOTE — Telephone Encounter (Unsigned)
 Copied from CRM #[id number]. Topic: Clinical - Prescription Issue >> Jul 05, 2024  8:04 AM Tiffini S wrote: Reason for CRM: Patient called stating that she was seen on 07/03/24 and her medication was adjusted for gabapentin  (NEURONTIN ) 300 MG capsule- was taking  gabapentin  (NEURONTIN ) 800 MG capsuleand she feels the dosage was decreased way too fast.   Please called to discuss medication change with the patient at [phone].

## 2024-07-05 NOTE — Telephone Encounter (Signed)
 Patient called again. Spoke with her per Dr Sampson. She will send in correct dose of gabapentin and patient can gradually go down on the medication.  - Jennifer Morrow M.

## 2024-07-18 ENCOUNTER — Telehealth: Payer: Self-pay

## 2024-07-18 NOTE — Telephone Encounter (Signed)
 Copied from CRM #[id number]. Topic: Clinical - Home Health Verbal Orders >> Jul 18, 2024 12:00 PM Teressa P wrote: Jennifer Morrow with [facility] called about an order for plan of care that was refaxed on th 19th and they have not received it back.  It is in the media section of patient chart.  FAX [phone]

## 2024-07-18 NOTE — Telephone Encounter (Signed)
Printed and faxed  KP 

## 2024-07-19 ENCOUNTER — Telehealth: Payer: Self-pay | Admitting: Internal Medicine

## 2024-07-19 NOTE — Telephone Encounter (Signed)
 Copied from CRM #[id number]. Topic: Clinical - Home Health Verbal Orders >> Jul 18, 2024 12:00 PM Teressa P wrote: Grayce with [facility] called about an order for plan of care that was refaxed on th 19th and they have not received it back.  It is in the media section of patient chart.  FAX [phone] >> Jul 19, 2024  9:05 AM Avram MATSU wrote: Grayce stated a form was faxed but it was only one page which is the last, please refax.

## 2024-07-19 NOTE — Telephone Encounter (Signed)
 FYI  KP

## 2024-07-19 NOTE — Telephone Encounter (Signed)
 Copied from CRM #[id number]. Topic: General - Call Back - No Documentation >> Jul 19, 2024  8:50 AM Tonda B wrote: Reason for CRM: [facility] calling saying that the patient missed an appt on 07/14/2024 please call [phone] ask for melanie

## 2024-07-20 NOTE — Telephone Encounter (Signed)
Re-faxed with signature.

## 2024-07-31 ENCOUNTER — Other Ambulatory Visit: Payer: Self-pay | Admitting: Internal Medicine

## 2024-07-31 DIAGNOSIS — M47816 Spondylosis without myelopathy or radiculopathy, lumbar region: Secondary | ICD-10-CM

## 2024-08-03 DIAGNOSIS — M4150 Other secondary scoliosis, site unspecified: Secondary | ICD-10-CM | POA: Diagnosis not present

## 2024-08-03 DIAGNOSIS — G8929 Other chronic pain: Secondary | ICD-10-CM | POA: Diagnosis not present

## 2024-08-03 DIAGNOSIS — M5442 Lumbago with sciatica, left side: Secondary | ICD-10-CM | POA: Diagnosis not present

## 2024-08-03 DIAGNOSIS — Z9889 Other specified postprocedural states: Secondary | ICD-10-CM | POA: Diagnosis not present

## 2024-08-03 DIAGNOSIS — M545 Low back pain, unspecified: Secondary | ICD-10-CM | POA: Diagnosis not present

## 2024-08-03 DIAGNOSIS — M419 Scoliosis, unspecified: Secondary | ICD-10-CM | POA: Diagnosis not present

## 2024-08-03 DIAGNOSIS — Z4789 Encounter for other orthopedic aftercare: Secondary | ICD-10-CM | POA: Diagnosis not present

## 2024-08-15 ENCOUNTER — Ambulatory Visit: Admitting: Physical Therapy

## 2024-08-22 ENCOUNTER — Ambulatory Visit: Attending: Spine Surgery | Admitting: Physical Therapy

## 2024-08-22 DIAGNOSIS — M5459 Other low back pain: Secondary | ICD-10-CM | POA: Diagnosis present

## 2024-08-22 DIAGNOSIS — M2560 Stiffness of unspecified joint, not elsewhere classified: Secondary | ICD-10-CM | POA: Insufficient documentation

## 2024-08-22 DIAGNOSIS — M4126 Other idiopathic scoliosis, lumbar region: Secondary | ICD-10-CM | POA: Diagnosis present

## 2024-08-22 DIAGNOSIS — R5381 Other malaise: Secondary | ICD-10-CM | POA: Insufficient documentation

## 2024-08-22 DIAGNOSIS — M6281 Muscle weakness (generalized): Secondary | ICD-10-CM | POA: Insufficient documentation

## 2024-08-24 ENCOUNTER — Ambulatory Visit: Admitting: Physical Therapy

## 2024-08-24 DIAGNOSIS — M6281 Muscle weakness (generalized): Secondary | ICD-10-CM | POA: Diagnosis not present

## 2024-08-24 DIAGNOSIS — M5459 Other low back pain: Secondary | ICD-10-CM

## 2024-08-24 DIAGNOSIS — R5381 Other malaise: Secondary | ICD-10-CM

## 2024-08-24 DIAGNOSIS — M2560 Stiffness of unspecified joint, not elsewhere classified: Secondary | ICD-10-CM

## 2024-08-24 DIAGNOSIS — M4126 Other idiopathic scoliosis, lumbar region: Secondary | ICD-10-CM

## 2024-08-26 ENCOUNTER — Encounter: Payer: Self-pay | Admitting: Physical Therapy

## 2024-08-26 NOTE — Therapy (Signed)
 " OUTPATIENT PHYSICAL THERAPY THORACOLUMBAR TREATMENT  Patient Name: Jennifer Morrow MRN: [id number] DOB:1950-12-19, 74 y.o., female Today's Date: 08/24/2024  END OF SESSION:  PT End of Session - 08/26/24 1819     Visit Number 2    Number of Visits 24    Date for Recertification  11/14/24    PT Start Time 0815    PT Stop Time 0902    PT Time Calculation (min) 47 min    Activity Tolerance Patient tolerated treatment well;Patient limited by fatigue;Patient limited by pain    Behavior During Therapy [facility] for tasks assessed/performed         Past Medical History:  Diagnosis Date   Allergy 2014   seasonal   Arthritis    Bronchitis    CHRONIC   Bulging lumbar disc    degenerative dics   Cataract    corrected with surgery   CHF (congestive heart failure) (HCC) 04/13/2024   Scoliosis    LIMITS LUNG FUNCTION   Spinal stenosis    Past Surgical History:  Procedure Laterality Date   CATARACT EXTRACTION W/PHACO Left 01/06/2018   Procedure: CATARACT EXTRACTION PHACO AND INTRAOCULAR LENS PLACEMENT (IOC);  Surgeon: Myrna Adine Anes, MD;  Location: [facility] ORS;  Service: Ophthalmology;  Laterality: Left;  US  00:27.5 AP% 4.3 CDE 1.18 FLUID PACK LOT # [id number]   CATARACT EXTRACTION W/PHACO Right 02/10/2018   Procedure: CATARACT EXTRACTION PHACO AND INTRAOCULAR LENS PLACEMENT (IOC);  Surgeon: Myrna Adine Anes, MD;  Location: [facility] ORS;  Service: Ophthalmology;  Laterality: Right;  US  00:30.5 AP% 5.0 CDE 1.51  Fluid pack lot # [id number]   EYE SURGERY  2019   cataract   FOOT X 2     HEEL SPUR EXCISION     LUMBAR SPINE SURGERY  05/01/2024   thoracolumbar fusioin   TONSILLECTOMY     Patient Active Problem List   Diagnosis Date Noted   Elevated liver enzymes 06/14/2023   Age-related osteoporosis without current pathological fracture 05/19/2021   Spondylosis of lumbar region without myelopathy or radiculopathy 05/19/2021   Scoliosis, unspecified 01/09/2015   Vitamin D  deficiency  01/08/2014   Mixed hyperlipidemia 01/08/2014   PCP: Lemon Raisin, MD  REFERRING PROVIDER: Emi Bills, MD  REFERRING DIAG:  Diagnosis  [phone] (ICD-10-CM) - Other specified postprocedural states  M41.9 (ICD-10-CM) - Scoliosis, unspecified  M41.50 (ICD-10-CM) - Other secondary scoliosis, site unspecified  G89.29 (ICD-10-CM) - Other chronic pain  M54.42 (ICD-10-CM) - Lumbago with sciatica, left side  M54.50 (ICD-10-CM) - Low back pain, unspecified   Rationale for Evaluation and Treatment: Rehabilitation  THERAPY DIAG:  Muscle weakness (generalized)  Joint stiffness of spine  Other low back pain  Physical deconditioning  Other idiopathic scoliosis, lumbar region  ONSET DATE:  05/11/24 surgery date.  See previous PT evaluation/ notes.            SUBJECTIVE:  SUBJECTIVE STATEMENT: Pt. S/p entire spinal fusion (T4 to S1) on 05/11/24.  Pt. Was receiving PT prior to surgery and has completed extensive inpatient/ HHPT.  Pt. Reports success with surgery and was told she has 0 deg. Thoracic curve and 20 deg. Lumbar curve.  Pt. Reports 2/10 back pain at rest (2 extra strength Tylenol), 9/10 at worst (feels hardware)- cold weather (stiff/ stabbing pain).  Pt. Unable to use hot tub at this time secondary to limited by weight of hot tub cover. Pt. Reports having a 5# lifting restriction at this time.      PERTINENT HISTORY:  Pt. Well known to PT clinic.    PAIN:  Are you having pain? Yes: NPRS scale: 2/10 Pain location: back Pain description: stiffness/ stabbing.  Aggravating factors: increase activity/ prolonged standing and walking/ cold weather Relieving factors: heat (hot tub use)  PRECAUTIONS: Back/ scoliosis/ lifting (>5#)  RED FLAGS: None   WEIGHT BEARING RESTRICTIONS: No  FALLS:  Has  patient fallen in last 6 months? No  LIVING ENVIRONMENT: Lives with: lives alone Lives in: House/apartment Has following equipment at home: Single point cane  OCCUPATION: Retired  PLOF: Independent  PATIENT GOALS: Get over commode without grab bars/ increase walking endurance and generalized strength/ conditioning.    NEXT MD VISIT: 10/2024  OBJECTIVE:  Note: Objective measures were completed at Evaluation unless otherwise noted.  DIAGNOSTIC FINDINGS:  See imaging  PATIENT SURVEYS:  MODI: 64% self-perceived extreme disability.   LEFS: 15 out of 80  COGNITION: Overall cognitive status: Within functional limits for tasks assessed     SENSATION: L LE to foot N/T  MUSCLE LENGTH: Hamstrings: Right 52 deg; Left 50 deg Hip flexion (supine): Right 100 deg.; Left 96 deg.   POSTURE: rounded shoulders and forward head.  Moderate scoliosis (L to R).    PALPATION: Tenderness over B thoracic/ lumbar paraspinals.  Excellent incision healing/ correction of scoliosis.     LUMBAR ROM:   Spinal fusion.  No spinal mobility.  Good hip ROM.    LOWER EXTREMITY ROM:     Active  Right eval Left eval  Hip flexion 100 deg. 96 deg.  Hip extension    Hip abduction [facility]  Hip adduction    Hip internal rotation 22 deg. 24 deg.  Hip external rotation    Knee flexion 136 deg. 132 deg.   Knee extension 0 deg. 0 deg.   Ankle dorsiflexion    Ankle plantarflexion    Ankle inversion    Ankle eversion     (Blank rows = not tested)  B shoulder flexion limited to 90 deg. Secondary to mid-back pain.     STS from mat table (22.5) without UE assist.  Pt. Requires UE assist with regular chair/ commode height.     Bed mobility requires extra time/ challenging.  Good understanding/ technique.   LOWER EXTREMITY MMT:    MMT Right eval Left eval  Hip flexion 3+ 3+  Hip extension    Hip abduction 3+ 3+  Hip adduction    Hip internal rotation 3+ 3+  Hip external rotation 3+ 3+  Knee  flexion 4+ 4+  Knee extension 4 4  Ankle dorsiflexion 4 4  Ankle plantarflexion    Ankle inversion    Ankle eversion     (Blank rows = not tested)  LUMBAR SPECIAL TESTS:  NT  FUNCTIONAL TESTS:  5 times sit to stand: TBD  GAIT: Distance walked: in clinic Assistive device utilized: None Level  of assistance: Modified independence Comments: Pt. Ambulates with consistent recip. Gait pattern with no assistive devices.  Pt. Has rounded shoulder posture with limited arm swing.  Good heel strike/ clearance.    TREATMENT DATE:  08/24/24               Subjective:  Pt. Reports 5/10 back pain and states she hasn't been able to use hot tub.  Pt. Will hopeful have someone open the cover today.  Pt. States she is having good and bad days.      There.ex.:  See HEP  There.act.:  Walking in hallway for 3 min. (6 laps)- focus on consistent gait pattern/ arm swing/ upright posture.  Walking in //-bars: forward/ backwards/ lateral.  Added alt. UE/LE touches in //-bars (no UE assist on //-bars noted).    Sit to stands from varying heights of mat table (requires UE assist with <22.5 in.)                                                                                         PATIENT EDUCATION:  Education details: Discussed STS/ HEP and daily activity Person educated: Patient Education method: Medical Illustrator Education comprehension: verbalized understanding and returned demonstration  HOME EXERCISE PROGRAM: Access Code: [email or web address] URL: [email or web address] Date: 08/24/2024 Prepared by: Ozell Sero  Exercises - Standing March with Counter Support  - 1 x daily - 5 x weekly - 2 sets - 15 reps - Standing Hip Abduction with Counter Support  - 1 x daily - 5 x weekly - 2 sets - 15 reps - Standing Hip Extension with Counter Support  - 1 x daily - 5 x weekly - 2 sets - 15 reps - Mini Squat with Counter Support  - 1 x daily - 5 x weekly - 2 sets - 15 reps - Standing  Shoulder Flexion ROM with Dowel  - 1 x daily - 5 x weekly - 2 sets - 15 reps - Standing Chest Press with Bar  - 1 x daily - 5 x weekly - 2 sets - 15 reps - Shoulder External Rotation and Scapular Retraction  - 1 x daily - 5 x weekly - 2 sets - 15 reps   ASSESSMENT:  CLINICAL IMPRESSION: Pt. Presents with marked spinal ROM limitations and generalized strength deficits.  Pt. Highly motivated to increase independence with transfers/ walking/ daily activity.  Good cadence while walking in hallway/ clinic and able to demonstrate good balance with  alt. UE/LE touches.  Good bed mobility and transfers from elevated mat table but requires extra time.  See new HEP.  Pt. Will benefit from skilled PT services to develop progressive HEP to increase generalized strength, esp. Core/lumbar musculature to improve pain-free functional mobility.      OBJECTIVE IMPAIRMENTS: Abnormal gait, decreased activity tolerance, decreased endurance, decreased mobility, difficulty walking, decreased ROM, decreased strength, impaired flexibility, improper body mechanics, postural dysfunction, and pain.   ACTIVITY LIMITATIONS: carrying, lifting, bending, standing, squatting, transfers, and locomotion level  PARTICIPATION LIMITATIONS: cleaning, laundry, driving, shopping, and community activity  PERSONAL FACTORS: Fitness and Past/current experiences are also affecting patient's functional outcome.   REHAB POTENTIAL:  Good  CLINICAL DECISION MAKING: Evolving/moderate complexity  EVALUATION COMPLEXITY: Moderate   GOALS: Goals reviewed with patient? Yes  SHORT TERM GOALS: Target date: 10/03/24  Pt. Independent with HEP to progress LE muscle strength 1/2 muscle grade to improve pain-free mobility.   Baseline: see above Goal status: INITIAL  LONG TERM GOALS: Target date: 11/14/24  Pt. Will decrease MODI to <40 to improve pain-free mobility.   Baseline: initial 64% self-perceived extreme disability.   Goal status:  INITIAL  2.  Pt. Able to stand from regular height commode with no UE assist safely to allow pt. To travel to family/ improve independence.   Baseline: Pt. Able to stand from 22.5 without UE assist and requires extra time.  Goal status: INITIAL  3.  Pt. Will report <5/10 back pain at worst with daily household tasks.   Baseline: 9/10 pain at worst Goal status: INITIAL  4.  Pt. Able to complete 1 hour of daily household tasks with good endurance/ no increase c/o back pain.    Baseline: limited endurance/ 9/10 pain Goal status: INITIAL  PLAN:  PT FREQUENCY: 2x/week  PT DURATION: 12 weeks  PLANNED INTERVENTIONS: 97110-Therapeutic exercises, 97530- Therapeutic activity, 97112- Neuromuscular re-education, 97535- Self Care, 02859- Manual therapy, [phone]- Gait training, [phone]- Aquatic Therapy, Patient/Family education, Balance training, Stair training, Dry Needling, Joint mobilization, Cryotherapy, and Moist heat.  PLAN FOR NEXT SESSION:  Review HEP (add AW)  Ozell JAYSON Sero, PT, DPT # [phone] 08/26/2024, 6:21 PM  "

## 2024-08-26 NOTE — Therapy (Signed)
 " OUTPATIENT PHYSICAL THERAPY THORACOLUMBAR EVALUATION  Patient Name: Jennifer Morrow MRN: [id number] DOB:09-Jul-1951, 74 y.o., female Today's Date: 08/22/2024  END OF SESSION:  PT End of Session - 08/26/24 1750     Visit Number 1    Number of Visits 24    Date for Recertification  11/14/24    PT Start Time 1116    PT Stop Time 1203    PT Time Calculation (min) 47 min    Activity Tolerance Patient tolerated treatment well;Patient limited by fatigue;Patient limited by pain    Behavior During Therapy [facility] for tasks assessed/performed         Past Medical History:  Diagnosis Date   Allergy 2014   seasonal   Arthritis    Bronchitis    CHRONIC   Bulging lumbar disc    degenerative dics   Cataract    corrected with surgery   CHF (congestive heart failure) (HCC) 04/13/2024   Scoliosis    LIMITS LUNG FUNCTION   Spinal stenosis    Past Surgical History:  Procedure Laterality Date   CATARACT EXTRACTION W/PHACO Left 01/06/2018   Procedure: CATARACT EXTRACTION PHACO AND INTRAOCULAR LENS PLACEMENT (IOC);  Surgeon: Myrna Adine Anes, MD;  Location: [facility] ORS;  Service: Ophthalmology;  Laterality: Left;  US  00:27.5 AP% 4.3 CDE 1.18 FLUID PACK LOT # [id number] H   CATARACT EXTRACTION W/PHACO Right 02/10/2018   Procedure: CATARACT EXTRACTION PHACO AND INTRAOCULAR LENS PLACEMENT (IOC);  Surgeon: Myrna Adine Anes, MD;  Location: [facility] ORS;  Service: Ophthalmology;  Laterality: Right;  US  00:30.5 AP% 5.0 CDE 1.51  Fluid pack lot # [id number] H   EYE SURGERY  2019   cataract   FOOT X 2     HEEL SPUR EXCISION     LUMBAR SPINE SURGERY  05/01/2024   thoracolumbar fusioin   TONSILLECTOMY     Patient Active Problem List   Diagnosis Date Noted   Elevated liver enzymes 06/14/2023   Age-related osteoporosis without current pathological fracture 05/19/2021   Spondylosis of lumbar region without myelopathy or radiculopathy 05/19/2021   Scoliosis, unspecified 01/09/2015   Vitamin D  deficiency  01/08/2014   Mixed hyperlipidemia 01/08/2014   PCP: Lemon Raisin, MD  REFERRING PROVIDER: Emi Bills, MD  REFERRING DIAG:  Diagnosis  [phone] (ICD-10-CM) - Other specified postprocedural states  M41.9 (ICD-10-CM) - Scoliosis, unspecified  M41.50 (ICD-10-CM) - Other secondary scoliosis, site unspecified  G89.29 (ICD-10-CM) - Other chronic pain  M54.42 (ICD-10-CM) - Lumbago with sciatica, left side  M54.50 (ICD-10-CM) - Low back pain, unspecified   Rationale for Evaluation and Treatment: Rehabilitation  THERAPY DIAG:  Muscle weakness (generalized)  Joint stiffness of spine  Other low back pain  Physical deconditioning  ONSET DATE:  05/11/24 surgery date.  See previous PT evaluation/ notes.            SUBJECTIVE:  SUBJECTIVE STATEMENT: Pt. S/p entire spinal fusion (T4 to S1) on 05/11/24.  Pt. Was receiving PT prior to surgery and has completed extensive inpatient/ HHPT.  Pt. Reports success with surgery and was told she has 0 deg. Thoracic curve and 20 deg. Lumbar curve.  Pt. Reports 2/10 back pain at rest (2 extra strength Tylenol), 9/10 at worst (feels hardware)- cold weather (stiff/ stabbing pain).  Pt. Unable to use hot tub at this time secondary to limited by weight of hot tub cover. Pt. Reports having a 5# lifting restriction at this time.      PERTINENT HISTORY:  Pt. Well known to PT clinic.    PAIN:  Are you having pain? Yes: NPRS scale: 2/10 Pain location: back Pain description: stiffness/ stabbing.  Aggravating factors: increase activity/ prolonged standing and walking/ cold weather Relieving factors: heat (hot tub use)  PRECAUTIONS: Back/ scoliosis/ lifting (>5#)  RED FLAGS: None   WEIGHT BEARING RESTRICTIONS: No  FALLS:  Has patient fallen in last 6 months? No  LIVING  ENVIRONMENT: Lives with: lives alone Lives in: House/apartment Has following equipment at home: Single point cane  OCCUPATION: Retired  PLOF: Independent  PATIENT GOALS: Get over commode without grab bars/ increase walking endurance and generalized strength/ conditioning.    NEXT MD VISIT: 10/2024  OBJECTIVE:  Note: Objective measures were completed at Evaluation unless otherwise noted.  DIAGNOSTIC FINDINGS:  See imaging  PATIENT SURVEYS:  MODI: 64% self-perceived extreme disability.   LEFS: 15 out of 80  COGNITION: Overall cognitive status: Within functional limits for tasks assessed     SENSATION: L LE to foot N/T  MUSCLE LENGTH: Hamstrings: Right 52 deg; Left 50 deg Hip flexion (supine): Right 100 deg.; Left 96 deg.   POSTURE: rounded shoulders and forward head.  Moderate scoliosis (L to R).    PALPATION: Tenderness over B thoracic/ lumbar paraspinals.  Excellent incision healing/ correction of scoliosis.     LUMBAR ROM:   Spinal fusion.  No spinal mobility.  Good hip ROM.    LOWER EXTREMITY ROM:     Active  Right eval Left eval  Hip flexion 100 deg. 96 deg.  Hip extension    Hip abduction [facility]  Hip adduction    Hip internal rotation 22 deg. 24 deg.  Hip external rotation    Knee flexion 136 deg. 132 deg.   Knee extension 0 deg. 0 deg.   Ankle dorsiflexion    Ankle plantarflexion    Ankle inversion    Ankle eversion     (Blank rows = not tested)  B shoulder flexion limited to 90 deg. Secondary to mid-back pain.     STS from mat table (22.5) without UE assist.  Pt. Requires UE assist with regular chair/ commode height.     Bed mobility requires extra time/ challenging.  Good understanding/ technique.   LOWER EXTREMITY MMT:    MMT Right eval Left eval  Hip flexion 3+ 3+  Hip extension    Hip abduction 3+ 3+  Hip adduction    Hip internal rotation 3+ 3+  Hip external rotation 3+ 3+  Knee flexion 4+ 4+  Knee extension 4 4  Ankle  dorsiflexion 4 4  Ankle plantarflexion    Ankle inversion    Ankle eversion     (Blank rows = not tested)  LUMBAR SPECIAL TESTS:  NT  FUNCTIONAL TESTS:  5 times sit to stand: TBD  GAIT: Distance walked: in clinic Assistive device utilized: None Level  of assistance: Modified independence Comments: Pt. Ambulates with consistent recip. Gait pattern with no assistive devices.  Pt. Has rounded shoulder posture with limited arm swing.  Good heel strike/ clearance.    TREATMENT DATE:  08/22/24               See evaluation/ discussed HEP                                                                                                                 PATIENT EDUCATION:  Education details: Discussed STS/ HEP and daily activity Person educated: Patient Education method: Explanation and Demonstration Education comprehension: verbalized understanding and returned demonstration  HOME EXERCISE PROGRAM: Will issue next tx. Session.     ASSESSMENT:  CLINICAL IMPRESSION: Patient is a pleasant 74 y.o. female who was seen today for physical therapy evaluation and treatment s/p extension spinal surgery.  Pt. Presents with marked spinal ROM limitations and generalized strength deficits.  Pt. Highly motivated to increase independence with transfers/ walking/ daily activity.   Pt. Will benefit from skilled PT services to develop progressive HEP to increase generalized strength, esp. Core/lumbar musculature to improve pain-free functional mobility.      OBJECTIVE IMPAIRMENTS: Abnormal gait, decreased activity tolerance, decreased endurance, decreased mobility, difficulty walking, decreased ROM, decreased strength, impaired flexibility, improper body mechanics, postural dysfunction, and pain.   ACTIVITY LIMITATIONS: carrying, lifting, bending, standing, squatting, transfers, and locomotion level  PARTICIPATION LIMITATIONS: cleaning, laundry, driving, shopping, and community activity  PERSONAL FACTORS:  Fitness and Past/current experiences are also affecting patient's functional outcome.   REHAB POTENTIAL: Good  CLINICAL DECISION MAKING: Evolving/moderate complexity  EVALUATION COMPLEXITY: Moderate   GOALS: Goals reviewed with patient? Yes  SHORT TERM GOALS: Target date: 10/03/24  Pt. Independent with HEP to progress LE muscle strength 1/2 muscle grade to improve pain-free mobility.   Baseline: see above Goal status: INITIAL  LONG TERM GOALS: Target date: 11/14/24  Pt. Will decrease MODI to <40 to improve pain-free mobility.   Baseline: initial 64% self-perceived extreme disability.   Goal status: INITIAL  2.  Pt. Able to stand from regular height commode with no UE assist safely to allow pt. To travel to family/ improve independence.   Baseline: Pt. Able to stand from 22.5 without UE assist and requires extra time.  Goal status: INITIAL  3.  Pt. Will report <5/10 back pain at worst with daily household tasks.   Baseline: 9/10 pain at worst Goal status: INITIAL  4.  Pt. Able to complete 1 hour of daily household tasks with good endurance/ no increase c/o back pain.    Baseline: limited endurance/ 9/10 pain Goal status: INITIAL  PLAN:  PT FREQUENCY: 2x/week  PT DURATION: 12 weeks  PLANNED INTERVENTIONS: 97110-Therapeutic exercises, 97530- Therapeutic activity, 97112- Neuromuscular re-education, 97535- Self Care, 02859- Manual therapy, [phone]- Gait training, [phone]- Aquatic Therapy, Patient/Family education, Balance training, Stair training, Dry Needling, Joint mobilization, Cryotherapy, and Moist heat.  PLAN FOR NEXT SESSION: Issue HEP  Ozell JAYSON Sero, PT, DPT # [phone]  08/26/2024, 5:52 PM  "

## 2024-08-28 ENCOUNTER — Encounter: Payer: Self-pay | Admitting: Physical Therapy

## 2024-08-28 ENCOUNTER — Ambulatory Visit: Admitting: Physical Therapy

## 2024-08-28 DIAGNOSIS — M5459 Other low back pain: Secondary | ICD-10-CM

## 2024-08-28 DIAGNOSIS — M6281 Muscle weakness (generalized): Secondary | ICD-10-CM

## 2024-08-28 DIAGNOSIS — R5381 Other malaise: Secondary | ICD-10-CM

## 2024-08-28 DIAGNOSIS — M2560 Stiffness of unspecified joint, not elsewhere classified: Secondary | ICD-10-CM

## 2024-08-28 NOTE — Therapy (Signed)
 " OUTPATIENT PHYSICAL THERAPY THORACOLUMBAR TREATMENT  Patient Name: Jennifer Morrow MRN: [id number] DOB:12/22/1950, 74 y.o., female Today's Date: 08/28/2024  END OF SESSION:  PT End of Session - 08/28/24 0842     Visit Number 3    Number of Visits 24    Date for Recertification  11/14/24    PT Start Time 0903    PT Stop Time 0947    PT Time Calculation (min) 44 min    Activity Tolerance Patient tolerated treatment well;Patient limited by fatigue;Patient limited by pain    Behavior During Therapy [facility] for tasks assessed/performed         Past Medical History:  Diagnosis Date   Allergy 2014   seasonal   Arthritis    Bronchitis    CHRONIC   Bulging lumbar disc    degenerative dics   Cataract    corrected with surgery   CHF (congestive heart failure) (HCC) 04/13/2024   Scoliosis    LIMITS LUNG FUNCTION   Spinal stenosis    Past Surgical History:  Procedure Laterality Date   CATARACT EXTRACTION W/PHACO Left 01/06/2018   Procedure: CATARACT EXTRACTION PHACO AND INTRAOCULAR LENS PLACEMENT (IOC);  Surgeon: Myrna Adine Anes, MD;  Location: [facility] ORS;  Service: Ophthalmology;  Laterality: Left;  US  00:27.5 AP% 4.3 CDE 1.18 FLUID PACK LOT # [id number]   CATARACT EXTRACTION W/PHACO Right 02/10/2018   Procedure: CATARACT EXTRACTION PHACO AND INTRAOCULAR LENS PLACEMENT (IOC);  Surgeon: Myrna Adine Anes, MD;  Location: [facility] ORS;  Service: Ophthalmology;  Laterality: Right;  US  00:30.5 AP% 5.0 CDE 1.51  Fluid pack lot # [id number]   EYE SURGERY  2019   cataract   FOOT X 2     HEEL SPUR EXCISION     LUMBAR SPINE SURGERY  05/01/2024   thoracolumbar fusioin   TONSILLECTOMY     Patient Active Problem List   Diagnosis Date Noted   Elevated liver enzymes 06/14/2023   Age-related osteoporosis without current pathological fracture 05/19/2021   Spondylosis of lumbar region without myelopathy or radiculopathy 05/19/2021   Scoliosis, unspecified 01/09/2015   Vitamin D  deficiency  01/08/2014   Mixed hyperlipidemia 01/08/2014   PCP: Lemon Raisin, MD  REFERRING PROVIDER: Emi Bills, MD  REFERRING DIAG:  Diagnosis  [id number] (ICD-10-CM) - Other specified postprocedural states  M41.9 (ICD-10-CM) - Scoliosis, unspecified  M41.50 (ICD-10-CM) - Other secondary scoliosis, site unspecified  G89.29 (ICD-10-CM) - Other chronic pain  M54.42 (ICD-10-CM) - Lumbago with sciatica, left side  M54.50 (ICD-10-CM) - Low back pain, unspecified   Rationale for Evaluation and Treatment: Rehabilitation  THERAPY DIAG:  Muscle weakness (generalized)  Joint stiffness of spine  Other low back pain  Physical deconditioning  ONSET DATE:  05/11/24 surgery date.  See previous PT evaluation/ notes.            SUBJECTIVE:  SUBJECTIVE STATEMENT: Pt. S/p entire spinal fusion (T4 to S1) on 05/11/24.  Pt. Was receiving PT prior to surgery and has completed extensive inpatient/ HHPT.  Pt. Reports success with surgery and was told she has 0 deg. Thoracic curve and 20 deg. Lumbar curve.  Pt. Reports 2/10 back pain at rest (2 extra strength Tylenol), 9/10 at worst (feels hardware)- cold weather (stiff/ stabbing pain).  Pt. Unable to use hot tub at this time secondary to limited by weight of hot tub cover. Pt. Reports having a 5# lifting restriction at this time.      PERTINENT HISTORY:  Pt. Well known to PT clinic.    PAIN:  Are you having pain? Yes: NPRS scale: 2/10 Pain location: back Pain description: stiffness/ stabbing.  Aggravating factors: increase activity/ prolonged standing and walking/ cold weather Relieving factors: heat (hot tub use)  PRECAUTIONS: Back/ scoliosis/ lifting (>5#)  RED FLAGS: None   WEIGHT BEARING RESTRICTIONS: No  FALLS:  Has patient fallen in last 6 months? No  LIVING  ENVIRONMENT: Lives with: lives alone Lives in: House/apartment Has following equipment at home: Single point cane  OCCUPATION: Retired  PLOF: Independent  PATIENT GOALS: Get over commode without grab bars/ increase walking endurance and generalized strength/ conditioning.    NEXT MD VISIT: 10/2024  OBJECTIVE:  Note: Objective measures were completed at Evaluation unless otherwise noted.  DIAGNOSTIC FINDINGS:  See imaging  PATIENT SURVEYS:  MODI: 64% self-perceived extreme disability.   LEFS: 15 out of 80  COGNITION: Overall cognitive status: Within functional limits for tasks assessed     SENSATION: L LE to foot N/T  MUSCLE LENGTH: Hamstrings: Right 52 deg; Left 50 deg Hip flexion (supine): Right 100 deg.; Left 96 deg.   POSTURE: rounded shoulders and forward head.  Moderate scoliosis (L to R).    PALPATION: Tenderness over B thoracic/ lumbar paraspinals.  Excellent incision healing/ correction of scoliosis.     LUMBAR ROM:   Spinal fusion.  No spinal mobility.  Good hip ROM.    LOWER EXTREMITY ROM:     Active  Right eval Left eval  Hip flexion 100 deg. 96 deg.  Hip extension    Hip abduction [facility]  Hip adduction    Hip internal rotation 22 deg. 24 deg.  Hip external rotation    Knee flexion 136 deg. 132 deg.   Knee extension 0 deg. 0 deg.   Ankle dorsiflexion    Ankle plantarflexion    Ankle inversion    Ankle eversion     (Blank rows = not tested)  B shoulder flexion limited to 90 deg. Secondary to mid-back pain.     STS from mat table (22.5) without UE assist.  Pt. Requires UE assist with regular chair/ commode height.     Bed mobility requires extra time/ challenging.  Good understanding/ technique.   LOWER EXTREMITY MMT:    MMT Right eval Left eval  Hip flexion 3+ 3+  Hip extension    Hip abduction 3+ 3+  Hip adduction    Hip internal rotation 3+ 3+  Hip external rotation 3+ 3+  Knee flexion 4+ 4+  Knee extension 4 4  Ankle  dorsiflexion 4 4  Ankle plantarflexion    Ankle inversion    Ankle eversion     (Blank rows = not tested)  LUMBAR SPECIAL TESTS:  NT  FUNCTIONAL TESTS:  5 times sit to stand: TBD  GAIT: Distance walked: in clinic Assistive device utilized: None Level  of assistance: Modified independence Comments: Pt. Ambulates with consistent recip. Gait pattern with no assistive devices.  Pt. Has rounded shoulder posture with limited arm swing.  Good heel strike/ clearance.    TREATMENT DATE:  08/28/2024              Subjective:  Pt. Reports sleeping well last night and reports 2/10 back pain and states she hasn't been able to use hot tub.  Pt. Will hopeful have someone open the cover today.  Pt. States she is having good and bad days.      Manual tx.:  Supine LE generalized stretches (focus on proximal/ distal hamstring, piriformis, hip) prior to Nustep.  10 minutes.  Pt. Challenged while going from seated to supine position on mat table (marked increase in back pain to 3/10).    There.ex.:  Nustep L2 B UE/LE 10 min.  Consistent SPM with no rest breaks.  Discussed activity at home.   Applied MH to back.    Standing 1st step hamstring stretches.   Static holds as tolerated 5x each.  Moderate tightness noted.    Step ups at 6 step 20x on L/R.  Light UE assist as needed.   Reviewed HEP.  There.act.:  Walking in hallway with focus on consistent gait pattern/ arm swing/ upright posture.  Walking in //-bars: forward/ backwards/ lateral 5 laps.  Added alt. UE/LE touches in //-bars (no UE assist on //-bars noted).    Sit to stands from varying heights of mat table (requires UE assist with <22.5 in.).  Added small lunges on L/R after each stand.                                                                                          PATIENT EDUCATION:  Education details: Discussed STS/ HEP and daily activity Person educated: Patient Education method: Medical Illustrator Education  comprehension: verbalized understanding and returned demonstration  HOME EXERCISE PROGRAM: Access Code: [email or web address] URL: [email or web address] Date: 08/24/2024 Prepared by: Ozell Sero  Exercises - Standing March with Counter Support  - 1 x daily - 5 x weekly - 2 sets - 15 reps - Standing Hip Abduction with Counter Support  - 1 x daily - 5 x weekly - 2 sets - 15 reps - Standing Hip Extension with Counter Support  - 1 x daily - 5 x weekly - 2 sets - 15 reps - Mini Squat with Counter Support  - 1 x daily - 5 x weekly - 2 sets - 15 reps - Standing Shoulder Flexion ROM with Dowel  - 1 x daily - 5 x weekly - 2 sets - 15 reps - Standing Chest Press with Bar  - 1 x daily - 5 x weekly - 2 sets - 15 reps - Shoulder External Rotation and Scapular Retraction  - 1 x daily - 5 x weekly - 2 sets - 15 reps   ASSESSMENT:  CLINICAL IMPRESSION: Pt. Presents with marked spinal ROM limitations and generalized strength deficits.  Pt. Highly motivated to increase independence with transfers/ walking/ daily activity.  Good cadence while walking in hallway/ clinic and able to demonstrate good  balance with  alt. UE/LE touches.  Marked increase in back symptoms/ pain with bed mobility and going from supine to seated posture on mat table.  No issues with current HEP.  Pt. Will benefit from skilled PT services to develop progressive HEP to increase generalized strength, esp. Core/lumbar musculature to improve pain-free functional mobility.      OBJECTIVE IMPAIRMENTS: Abnormal gait, decreased activity tolerance, decreased endurance, decreased mobility, difficulty walking, decreased ROM, decreased strength, impaired flexibility, improper body mechanics, postural dysfunction, and pain.   ACTIVITY LIMITATIONS: carrying, lifting, bending, standing, squatting, transfers, and locomotion level  PARTICIPATION LIMITATIONS: cleaning, laundry, driving, shopping, and community activity  PERSONAL FACTORS: Fitness and  Past/current experiences are also affecting patient's functional outcome.   REHAB POTENTIAL: Good  CLINICAL DECISION MAKING: Evolving/moderate complexity  EVALUATION COMPLEXITY: Moderate   GOALS: Goals reviewed with patient? Yes  SHORT TERM GOALS: Target date: 10/03/24  Pt. Independent with HEP to progress LE muscle strength 1/2 muscle grade to improve pain-free mobility.   Baseline: see above Goal status: INITIAL  LONG TERM GOALS: Target date: 11/14/24  Pt. Will decrease MODI to <40 to improve pain-free mobility.   Baseline: initial 64% self-perceived extreme disability.   Goal status: INITIAL  2.  Pt. Able to stand from regular height commode with no UE assist safely to allow pt. To travel to family/ improve independence.   Baseline: Pt. Able to stand from 22.5 without UE assist and requires extra time.  Goal status: INITIAL  3.  Pt. Will report <5/10 back pain at worst with daily household tasks.   Baseline: 9/10 pain at worst Goal status: INITIAL  4.  Pt. Able to complete 1 hour of daily household tasks with good endurance/ no increase c/o back pain.    Baseline: limited endurance/ 9/10 pain Goal status: INITIAL  PLAN:  PT FREQUENCY: 2x/week  PT DURATION: 12 weeks  PLANNED INTERVENTIONS: 97110-Therapeutic exercises, 97530- Therapeutic activity, 97112- Neuromuscular re-education, 97535- Self Care, 02859- Manual therapy, [phone]- Gait training, [phone]- Aquatic Therapy, Patient/Family education, Balance training, Stair training, Dry Needling, Joint mobilization, Cryotherapy, and Moist heat.  PLAN FOR NEXT SESSION:  Review HEP (add AW)  Ozell JAYSON Sero, PT, DPT # [phone] 08/28/2024, 5:10 PM  "

## 2024-08-31 ENCOUNTER — Encounter: Payer: Self-pay | Admitting: Physical Therapy

## 2024-08-31 ENCOUNTER — Ambulatory Visit: Admitting: Physical Therapy

## 2024-08-31 DIAGNOSIS — M5459 Other low back pain: Secondary | ICD-10-CM

## 2024-08-31 DIAGNOSIS — M6281 Muscle weakness (generalized): Secondary | ICD-10-CM

## 2024-08-31 DIAGNOSIS — M2560 Stiffness of unspecified joint, not elsewhere classified: Secondary | ICD-10-CM

## 2024-08-31 DIAGNOSIS — R5381 Other malaise: Secondary | ICD-10-CM

## 2024-08-31 NOTE — Therapy (Signed)
 " OUTPATIENT PHYSICAL THERAPY THORACOLUMBAR TREATMENT  Patient Name: Jennifer Morrow MRN: [id number] DOB:1951-04-28, 74 y.o., female Today's Date: 08/31/2024  END OF SESSION:  PT End of Session - 08/31/24 0821     Visit Number 4    Number of Visits 24    Date for Recertification  11/14/24    PT Start Time 0815    Activity Tolerance Patient tolerated treatment well;Patient limited by fatigue;Patient limited by pain    Behavior During Therapy [facility] for tasks assessed/performed         0815 to 0903  (48 minutes).    Past Medical History:  Diagnosis Date   Allergy 2014   seasonal   Arthritis    Bronchitis    CHRONIC   Bulging lumbar disc    degenerative dics   Cataract    corrected with surgery   CHF (congestive heart failure) (HCC) 04/13/2024   Scoliosis    LIMITS LUNG FUNCTION   Spinal stenosis    Past Surgical History:  Procedure Laterality Date   CATARACT EXTRACTION W/PHACO Left 01/06/2018   Procedure: CATARACT EXTRACTION PHACO AND INTRAOCULAR LENS PLACEMENT (IOC);  Surgeon: Myrna Adine Anes, MD;  Location: [facility] ORS;  Service: Ophthalmology;  Laterality: Left;  US  00:27.5 AP% 4.3 CDE 1.18 FLUID PACK LOT # [id number] H   CATARACT EXTRACTION W/PHACO Right 02/10/2018   Procedure: CATARACT EXTRACTION PHACO AND INTRAOCULAR LENS PLACEMENT (IOC);  Surgeon: Myrna Adine Anes, MD;  Location: [facility] ORS;  Service: Ophthalmology;  Laterality: Right;  US  00:30.5 AP% 5.0 CDE 1.51  Fluid pack lot # [id number] H   EYE SURGERY  2019   cataract   FOOT X 2     HEEL SPUR EXCISION     LUMBAR SPINE SURGERY  05/01/2024   thoracolumbar fusioin   TONSILLECTOMY     Patient Active Problem List   Diagnosis Date Noted   Elevated liver enzymes 06/14/2023   Age-related osteoporosis without current pathological fracture 05/19/2021   Spondylosis of lumbar region without myelopathy or radiculopathy 05/19/2021   Scoliosis, unspecified 01/09/2015   Vitamin D  deficiency 01/08/2014   Mixed  hyperlipidemia 01/08/2014   PCP: Lemon Raisin, MD  REFERRING PROVIDER: Emi Bills, MD  REFERRING DIAG:  Diagnosis  [phone] (ICD-10-CM) - Other specified postprocedural states  M41.9 (ICD-10-CM) - Scoliosis, unspecified  M41.50 (ICD-10-CM) - Other secondary scoliosis, site unspecified  G89.29 (ICD-10-CM) - Other chronic pain  M54.42 (ICD-10-CM) - Lumbago with sciatica, left side  M54.50 (ICD-10-CM) - Low back pain, unspecified   Rationale for Evaluation and Treatment: Rehabilitation  THERAPY DIAG:  Muscle weakness (generalized)  Joint stiffness of spine  Other low back pain  Physical deconditioning  ONSET DATE:  05/11/24 surgery date.  See previous PT evaluation/ notes.            SUBJECTIVE:  SUBJECTIVE STATEMENT: Pt. S/p entire spinal fusion (T4 to S1) on 05/11/24.  Pt. Was receiving PT prior to surgery and has completed extensive inpatient/ HHPT.  Pt. Reports success with surgery and was told she has 0 deg. Thoracic curve and 20 deg. Lumbar curve.  Pt. Reports 2/10 back pain at rest (2 extra strength Tylenol), 9/10 at worst (feels hardware)- cold weather (stiff/ stabbing pain).  Pt. Unable to use hot tub at this time secondary to limited by weight of hot tub cover. Pt. Reports having a 5# lifting restriction at this time.      PERTINENT HISTORY:  Pt. Well known to PT clinic.    PAIN:  Are you having pain? Yes: NPRS scale: 2/10 Pain location: back Pain description: stiffness/ stabbing.  Aggravating factors: increase activity/ prolonged standing and walking/ cold weather Relieving factors: heat (hot tub use)  PRECAUTIONS: Back/ scoliosis/ lifting (>5#)  RED FLAGS: None   WEIGHT BEARING RESTRICTIONS: No  FALLS:  Has patient fallen in last 6 months? No  LIVING ENVIRONMENT: Lives  with: lives alone Lives in: House/apartment Has following equipment at home: Single point cane  OCCUPATION: Retired  PLOF: Independent  PATIENT GOALS: Get over commode without grab bars/ increase walking endurance and generalized strength/ conditioning.    NEXT MD VISIT: 10/2024  OBJECTIVE:  Note: Objective measures were completed at Evaluation unless otherwise noted.  DIAGNOSTIC FINDINGS:  See imaging  PATIENT SURVEYS:  MODI: 64% self-perceived extreme disability.   LEFS: 15 out of 80  COGNITION: Overall cognitive status: Within functional limits for tasks assessed     SENSATION: L LE to foot N/T  MUSCLE LENGTH: Hamstrings: Right 52 deg; Left 50 deg Hip flexion (supine): Right 100 deg.; Left 96 deg.   POSTURE: rounded shoulders and forward head.  Moderate scoliosis (L to R).    PALPATION: Tenderness over B thoracic/ lumbar paraspinals.  Excellent incision healing/ correction of scoliosis.     LUMBAR ROM:   Spinal fusion.  No spinal mobility.  Good hip ROM.    LOWER EXTREMITY ROM:     Active  Right eval Left eval  Hip flexion 100 deg. 96 deg.  Hip extension    Hip abduction [facility]  Hip adduction    Hip internal rotation 22 deg. 24 deg.  Hip external rotation    Knee flexion 136 deg. 132 deg.   Knee extension 0 deg. 0 deg.   Ankle dorsiflexion    Ankle plantarflexion    Ankle inversion    Ankle eversion     (Blank rows = not tested)  B shoulder flexion limited to 90 deg. Secondary to mid-back pain.     STS from mat table (22.5) without UE assist.  Pt. Requires UE assist with regular chair/ commode height.     Bed mobility requires extra time/ challenging.  Good understanding/ technique.   LOWER EXTREMITY MMT:    MMT Right eval Left eval  Hip flexion 3+ 3+  Hip extension    Hip abduction 3+ 3+  Hip adduction    Hip internal rotation 3+ 3+  Hip external rotation 3+ 3+  Knee flexion 4+ 4+  Knee extension 4 4  Ankle dorsiflexion 4 4  Ankle  plantarflexion    Ankle inversion    Ankle eversion     (Blank rows = not tested)  LUMBAR SPECIAL TESTS:  NT  FUNCTIONAL TESTS:  5 times sit to stand: TBD  GAIT: Distance walked: in clinic Assistive device utilized: None Level  of assistance: Modified independence Comments: Pt. Ambulates with consistent recip. Gait pattern with no assistive devices.  Pt. Has rounded shoulder posture with limited arm swing.  Good heel strike/ clearance.    TREATMENT DATE:  08/31/2024              Subjective:  Pt. Reports 3-4/10 back pain prior to tx. Session.  Pt. Continues to have good and bad days.  Pt. Took Meloxicam and Tylenol this morning.  R hip discomfort with step ups.      There.ex.:  Nustep L3 B UE/LE 10 min.  Consistent SPM with no rest breaks.   Applied MH to back.    Standing 2nd step lunges/ hamstring stretches.   Static holds as tolerated 5x each.  Moderate tightness noted.    Standing hip ex.: 3# ankle wts for hip flexion/ abduction/ heel raises.  Walking around PT clinic with 3# ankle wts.    Step ups at 6 step 10x on L/R.  Light UE assist as needed.   Reviewed HEP.  There.act.:  Walking in hallway with focus on consistent gait pattern/ arm swing/ upright posture.  Walking in //-bars: forward/ backwards/ lateral 5 laps.  Added alt. UE/LE touches in //-bars (no UE assist on //-bars noted).    Sit to stands from varying heights of mat table (requires UE assist with <22.5 in.).  Added small lunges on L/R after each stand.   Manual tx.:  Supine LE generalized stretches (focus on proximal/ distal hamstring, piriformis, hip).  Pt. Challenged and requires extra time/ min. PT assist while going from seated to supine position on mat table.                                                                                          PATIENT EDUCATION:  Education details: Discussed STS/ HEP and daily activity Person educated: Patient Education method: Software Engineer Education comprehension: verbalized understanding and returned demonstration  HOME EXERCISE PROGRAM: Access Code: [email or web address] URL: [email or web address] Date: 08/24/2024 Prepared by: Ozell Sero  Exercises - Standing March with Counter Support  - 1 x daily - 5 x weekly - 2 sets - 15 reps - Standing Hip Abduction with Counter Support  - 1 x daily - 5 x weekly - 2 sets - 15 reps - Standing Hip Extension with Counter Support  - 1 x daily - 5 x weekly - 2 sets - 15 reps - Mini Squat with Counter Support  - 1 x daily - 5 x weekly - 2 sets - 15 reps - Standing Shoulder Flexion ROM with Dowel  - 1 x daily - 5 x weekly - 2 sets - 15 reps - Standing Chest Press with Bar  - 1 x daily - 5 x weekly - 2 sets - 15 reps - Shoulder External Rotation and Scapular Retraction  - 1 x daily - 5 x weekly - 2 sets - 15 reps   ASSESSMENT:  CLINICAL IMPRESSION: Pt. Presents with marked spinal ROM limitations and generalized strength deficits.  Pt. Highly motivated to increase independence with transfers/ walking/ daily activity.  Good cadence while walking in hallway/ clinic  and able to demonstrate good balance with  alt. UE/LE touches.  Marked increase in back symptoms/ pain with bed mobility and going from supine to seated posture on mat table.  Discussed use of ankle wts with HEP.  Pt. Will benefit from skilled PT services to develop progressive HEP to increase generalized strength, esp. Core/lumbar musculature to improve pain-free functional mobility.      OBJECTIVE IMPAIRMENTS: Abnormal gait, decreased activity tolerance, decreased endurance, decreased mobility, difficulty walking, decreased ROM, decreased strength, impaired flexibility, improper body mechanics, postural dysfunction, and pain.   ACTIVITY LIMITATIONS: carrying, lifting, bending, standing, squatting, transfers, and locomotion level  PARTICIPATION LIMITATIONS: cleaning, laundry, driving, shopping, and community  activity  PERSONAL FACTORS: Fitness and Past/current experiences are also affecting patient's functional outcome.   REHAB POTENTIAL: Good  CLINICAL DECISION MAKING: Evolving/moderate complexity  EVALUATION COMPLEXITY: Moderate   GOALS: Goals reviewed with patient? Yes  SHORT TERM GOALS: Target date: 10/03/24  Pt. Independent with HEP to progress LE muscle strength 1/2 muscle grade to improve pain-free mobility.   Baseline: see above Goal status: INITIAL  LONG TERM GOALS: Target date: 11/14/24  Pt. Will decrease MODI to <40 to improve pain-free mobility.   Baseline: initial 64% self-perceived extreme disability.   Goal status: INITIAL  2.  Pt. Able to stand from regular height commode with no UE assist safely to allow pt. To travel to family/ improve independence.   Baseline: Pt. Able to stand from 22.5 without UE assist and requires extra time.  Goal status: INITIAL  3.  Pt. Will report <5/10 back pain at worst with daily household tasks.   Baseline: 9/10 pain at worst Goal status: INITIAL  4.  Pt. Able to complete 1 hour of daily household tasks with good endurance/ no increase c/o back pain.    Baseline: limited endurance/ 9/10 pain Goal status: INITIAL  PLAN:  PT FREQUENCY: 2x/week  PT DURATION: 12 weeks  PLANNED INTERVENTIONS: 97110-Therapeutic exercises, 97530- Therapeutic activity, 97112- Neuromuscular re-education, 97535- Self Care, 02859- Manual therapy, [phone]- Gait training, [phone]- Aquatic Therapy, Patient/Family education, Balance training, Stair training, Dry Needling, Joint mobilization, Cryotherapy, and Moist heat.  PLAN FOR NEXT SESSION:  Increase standing/ walking endurance  Ozell JAYSON Sero, PT, DPT # [phone] 08/31/2024, 8:22 AM  "

## 2024-09-04 DIAGNOSIS — Z981 Arthrodesis status: Secondary | ICD-10-CM | POA: Diagnosis not present

## 2024-09-04 DIAGNOSIS — R32 Unspecified urinary incontinence: Secondary | ICD-10-CM | POA: Diagnosis not present

## 2024-09-04 DIAGNOSIS — M8580 Other specified disorders of bone density and structure, unspecified site: Secondary | ICD-10-CM | POA: Diagnosis not present

## 2024-09-04 DIAGNOSIS — M48061 Spinal stenosis, lumbar region without neurogenic claudication: Secondary | ICD-10-CM | POA: Diagnosis not present

## 2024-09-04 DIAGNOSIS — E559 Vitamin D deficiency, unspecified: Secondary | ICD-10-CM | POA: Diagnosis not present

## 2024-09-04 DIAGNOSIS — Z79891 Long term (current) use of opiate analgesic: Secondary | ICD-10-CM | POA: Diagnosis not present

## 2024-09-04 DIAGNOSIS — M419 Scoliosis, unspecified: Secondary | ICD-10-CM | POA: Diagnosis not present

## 2024-09-04 DIAGNOSIS — E785 Hyperlipidemia, unspecified: Secondary | ICD-10-CM | POA: Diagnosis not present

## 2024-09-04 DIAGNOSIS — G8918 Other acute postprocedural pain: Secondary | ICD-10-CM | POA: Diagnosis not present

## 2024-09-04 DIAGNOSIS — Z4789 Encounter for other orthopedic aftercare: Secondary | ICD-10-CM | POA: Diagnosis not present

## 2024-09-04 DIAGNOSIS — G8929 Other chronic pain: Secondary | ICD-10-CM | POA: Diagnosis not present

## 2024-09-04 DIAGNOSIS — M6281 Muscle weakness (generalized): Secondary | ICD-10-CM | POA: Diagnosis not present

## 2024-09-05 ENCOUNTER — Ambulatory Visit: Admitting: Physical Therapy

## 2024-09-05 ENCOUNTER — Encounter: Payer: Self-pay | Admitting: Physical Therapy

## 2024-09-05 DIAGNOSIS — M6281 Muscle weakness (generalized): Secondary | ICD-10-CM

## 2024-09-05 DIAGNOSIS — R5381 Other malaise: Secondary | ICD-10-CM

## 2024-09-05 DIAGNOSIS — M5459 Other low back pain: Secondary | ICD-10-CM

## 2024-09-05 DIAGNOSIS — M2560 Stiffness of unspecified joint, not elsewhere classified: Secondary | ICD-10-CM

## 2024-09-05 NOTE — Therapy (Signed)
 " OUTPATIENT PHYSICAL THERAPY THORACOLUMBAR TREATMENT  Patient Name: Jennifer Morrow MRN: [id number] DOB:1950/10/18, 74 y.o., female Today's Date: 09/05/2024  END OF SESSION:  PT End of Session - 09/05/24 0901     Visit Number 5    Number of Visits 24    Date for Recertification  11/14/24    PT Start Time 0901    PT Stop Time 1000    PT Time Calculation (min) 59 min    Activity Tolerance Patient tolerated treatment well;Patient limited by fatigue;Patient limited by pain    Behavior During Therapy [facility] for tasks assessed/performed         Past Medical History:  Diagnosis Date   Allergy 2014   seasonal   Arthritis    Bronchitis    CHRONIC   Bulging lumbar disc    degenerative dics   Cataract    corrected with surgery   CHF (congestive heart failure) (HCC) 04/13/2024   Scoliosis    LIMITS LUNG FUNCTION   Spinal stenosis    Past Surgical History:  Procedure Laterality Date   CATARACT EXTRACTION W/PHACO Left 01/06/2018   Procedure: CATARACT EXTRACTION PHACO AND INTRAOCULAR LENS PLACEMENT (IOC);  Surgeon: Myrna Adine Anes, MD;  Location: [facility] ORS;  Service: Ophthalmology;  Laterality: Left;  US  00:27.5 AP% 4.3 CDE 1.18 FLUID PACK LOT # [id number]   CATARACT EXTRACTION W/PHACO Right 02/10/2018   Procedure: CATARACT EXTRACTION PHACO AND INTRAOCULAR LENS PLACEMENT (IOC);  Surgeon: Myrna Adine Anes, MD;  Location: [facility] ORS;  Service: Ophthalmology;  Laterality: Right;  US  00:30.5 AP% 5.0 CDE 1.51  Fluid pack lot # [id number]   EYE SURGERY  2019   cataract   FOOT X 2     HEEL SPUR EXCISION     LUMBAR SPINE SURGERY  05/01/2024   thoracolumbar fusioin   TONSILLECTOMY     Patient Active Problem List   Diagnosis Date Noted   Elevated liver enzymes 06/14/2023   Age-related osteoporosis without current pathological fracture 05/19/2021   Spondylosis of lumbar region without myelopathy or radiculopathy 05/19/2021   Scoliosis, unspecified 01/09/2015   Vitamin D  deficiency  01/08/2014   Mixed hyperlipidemia 01/08/2014   PCP: Lemon Raisin, MD  REFERRING PROVIDER: Emi Bills, MD  REFERRING DIAG:  Diagnosis  [phone] (ICD-10-CM) - Other specified postprocedural states  M41.9 (ICD-10-CM) - Scoliosis, unspecified  M41.50 (ICD-10-CM) - Other secondary scoliosis, site unspecified  G89.29 (ICD-10-CM) - Other chronic pain  M54.42 (ICD-10-CM) - Lumbago with sciatica, left side  M54.50 (ICD-10-CM) - Low back pain, unspecified   Rationale for Evaluation and Treatment: Rehabilitation  THERAPY DIAG:  Muscle weakness (generalized)  Joint stiffness of spine  Other low back pain  Physical deconditioning  ONSET DATE:  05/11/24 surgery date.  See previous PT evaluation/ notes.            SUBJECTIVE:  SUBJECTIVE STATEMENT: Pt. S/p entire spinal fusion (T4 to S1) on 05/11/24.  Pt. Was receiving PT prior to surgery and has completed extensive inpatient/ HHPT.  Pt. Reports success with surgery and was told she has 0 deg. Thoracic curve and 20 deg. Lumbar curve.  Pt. Reports 2/10 back pain at rest (2 extra strength Tylenol), 9/10 at worst (feels hardware)- cold weather (stiff/ stabbing pain).  Pt. Unable to use hot tub at this time secondary to limited by weight of hot tub cover. Pt. Reports having a 5# lifting restriction at this time.      PERTINENT HISTORY:  Pt. Well known to PT clinic.    PAIN:  Are you having pain? Yes: NPRS scale: 2/10 Pain location: back Pain description: stiffness/ stabbing.  Aggravating factors: increase activity/ prolonged standing and walking/ cold weather Relieving factors: heat (hot tub use)  PRECAUTIONS: Back/ scoliosis/ lifting (>5#)  RED FLAGS: None   WEIGHT BEARING RESTRICTIONS: No  FALLS:  Has patient fallen in last 6 months? No  LIVING  ENVIRONMENT: Lives with: lives alone Lives in: House/apartment Has following equipment at home: Single point cane  OCCUPATION: Retired  PLOF: Independent  PATIENT GOALS: Get over commode without grab bars/ increase walking endurance and generalized strength/ conditioning.    NEXT MD VISIT: 10/2024  OBJECTIVE:  Note: Objective measures were completed at Evaluation unless otherwise noted.  DIAGNOSTIC FINDINGS:  See imaging  PATIENT SURVEYS:  MODI: 64% self-perceived extreme disability.   LEFS: 15 out of 80  COGNITION: Overall cognitive status: Within functional limits for tasks assessed     SENSATION: L LE to foot N/T  MUSCLE LENGTH: Hamstrings: Right 52 deg; Left 50 deg Hip flexion (supine): Right 100 deg.; Left 96 deg.   POSTURE: rounded shoulders and forward head.  Moderate scoliosis (L to R).    PALPATION: Tenderness over B thoracic/ lumbar paraspinals.  Excellent incision healing/ correction of scoliosis.     LUMBAR ROM:   Spinal fusion.  No spinal mobility.  Good hip ROM.    LOWER EXTREMITY ROM:     Active  Right eval Left eval  Hip flexion 100 deg. 96 deg.  Hip extension    Hip abduction [facility]  Hip adduction    Hip internal rotation 22 deg. 24 deg.  Hip external rotation    Knee flexion 136 deg. 132 deg.   Knee extension 0 deg. 0 deg.   Ankle dorsiflexion    Ankle plantarflexion    Ankle inversion    Ankle eversion     (Blank rows = not tested)  B shoulder flexion limited to 90 deg. Secondary to mid-back pain.     STS from mat table (22.5) without UE assist.  Pt. Requires UE assist with regular chair/ commode height.     Bed mobility requires extra time/ challenging.  Good understanding/ technique.   LOWER EXTREMITY MMT:    MMT Right eval Left eval  Hip flexion 3+ 3+  Hip extension    Hip abduction 3+ 3+  Hip adduction    Hip internal rotation 3+ 3+  Hip external rotation 3+ 3+  Knee flexion 4+ 4+  Knee extension 4 4  Ankle  dorsiflexion 4 4  Ankle plantarflexion    Ankle inversion    Ankle eversion     (Blank rows = not tested)  LUMBAR SPECIAL TESTS:  NT  FUNCTIONAL TESTS:  5 times sit to stand: TBD  GAIT: Distance walked: in clinic Assistive device utilized: None Level  of assistance: Modified independence Comments: Pt. Ambulates with consistent recip. Gait pattern with no assistive devices.  Pt. Has rounded shoulder posture with limited arm swing.  Good heel strike/ clearance.    TREATMENT DATE:  09/05/2024              Subjective:   Pt reports 3/10 back pain today prior to treatment. Pt reports that they can feel hardware in her back since the surgery. Went to play cards on Friday with friends for a hour. Reports an increase in back discomfort after cards but it was good to socialize with people. Planning to go again next time.      There.ex.:  Nustep L4 B UE/LE 10 min.  Consistent SPM with no rest breaks.   Applied MH to back.    Standing 2nd step lunges/ hamstring stretches.   Static holds as tolerated 5x each.   Standing hip ex.: 4# ankle wts for hip flexion/ abduction/ heel raises/ dosiflexion/ hamstring curling (to fatigue).    Standing scap. retractions, ER, Horizontal Abduction, lateral raises 2 sets with yellow theraband.    There.act.:  Walking in hallway with focus on consistent gait pattern/ arm swing/ upright posture with 4# AW for about 348ft.  STS from blue mat table decreasing the height till unable to stand without UE assist.    Manual tx.:  Supine LE generalized stretches (focus on proximal/ distal hamstring, piriformis, hip).  Pt. Challenged and requires extra time/ min. PT assist while going from seated to supine position on mat table.                                                                                          PATIENT EDUCATION:  Education details: Discussed STS/ HEP and daily activity Person educated: Patient Education method: Software Engineer Education comprehension: verbalized understanding and returned demonstration  HOME EXERCISE PROGRAM: Access Code: [email or web address] URL: [email or web address] Date: 08/24/2024 Prepared by: Ozell Sero  Exercises - Standing March with Counter Support  - 1 x daily - 5 x weekly - 2 sets - 15 reps - Standing Hip Abduction with Counter Support  - 1 x daily - 5 x weekly - 2 sets - 15 reps - Standing Hip Extension with Counter Support  - 1 x daily - 5 x weekly - 2 sets - 15 reps - Mini Squat with Counter Support  - 1 x daily - 5 x weekly - 2 sets - 15 reps - Standing Shoulder Flexion ROM with Dowel  - 1 x daily - 5 x weekly - 2 sets - 15 reps - Standing Chest Press with Bar  - 1 x daily - 5 x weekly - 2 sets - 15 reps - Shoulder External Rotation and Scapular Retraction  - 1 x daily - 5 x weekly - 2 sets - 15 reps   ASSESSMENT:  CLINICAL IMPRESSION: Pt Presents with spinal ROM and strength deficits. Pt is motivated to complete exercise and wants to get back to a pain-free life. Pt did well in hallway walking with 4# AW with good heel strike and toe-off and no scuffing while walking.  Pt has  pain with bed mobility and transferring from supine to sitting and standing.  Pt benefits from elevated head of bed and min. PT assist to obtain position.  Pt. Will benefit from skilled PT services to develop progressive HEP to increase generalized strength, esp. Core/lumbar musculature to improve pain-free functional mobility.        OBJECTIVE IMPAIRMENTS: Abnormal gait, decreased activity tolerance, decreased endurance, decreased mobility, difficulty walking, decreased ROM, decreased strength, impaired flexibility, improper body mechanics, postural dysfunction, and pain.   ACTIVITY LIMITATIONS: carrying, lifting, bending, standing, squatting, transfers, and locomotion level  PARTICIPATION LIMITATIONS: cleaning, laundry, driving, shopping, and community activity  PERSONAL FACTORS:  Fitness and Past/current experiences are also affecting patient's functional outcome.   REHAB POTENTIAL: Good  CLINICAL DECISION MAKING: Evolving/moderate complexity  EVALUATION COMPLEXITY: Moderate   GOALS: Goals reviewed with patient? Yes  SHORT TERM GOALS: Target date: 10/03/24  Pt. Independent with HEP to progress LE muscle strength 1/2 muscle grade to improve pain-free mobility.   Baseline: see above Goal status: INITIAL  LONG TERM GOALS: Target date: 11/14/24  Pt. Will decrease MODI to <40 to improve pain-free mobility.   Baseline: initial 64% self-perceived extreme disability.   Goal status: INITIAL  2.  Pt. Able to stand from regular height commode with no UE assist safely to allow pt. To travel to family/ improve independence.   Baseline: Pt. Able to stand from 22.5 without UE assist and requires extra time.  Goal status: INITIAL  3.  Pt. Will report <5/10 back pain at worst with daily household tasks.   Baseline: 9/10 pain at worst Goal status: INITIAL  4.  Pt. Able to complete 1 hour of daily household tasks with good endurance/ no increase c/o back pain.    Baseline: limited endurance/ 9/10 pain Goal status: INITIAL  PLAN:  PT FREQUENCY: 2x/week  PT DURATION: 12 weeks  PLANNED INTERVENTIONS: 97110-Therapeutic exercises, 97530- Therapeutic activity, 97112- Neuromuscular re-education, 97535- Self Care, 02859- Manual therapy, [phone]- Gait training, [phone]- Aquatic Therapy, Patient/Family education, Balance training, Stair training, Dry Needling, Joint mobilization, Cryotherapy, and Moist heat.  PLAN FOR NEXT SESSION:  Increase standing/ walking endurance  Ozell JAYSON Sero, PT, DPT # [phone] Rankin Gainer, SPT. 09/05/2024, 5:37 PM  "

## 2024-09-07 ENCOUNTER — Ambulatory Visit: Admitting: Physical Therapy

## 2024-09-07 DIAGNOSIS — M6281 Muscle weakness (generalized): Secondary | ICD-10-CM

## 2024-09-07 DIAGNOSIS — R5381 Other malaise: Secondary | ICD-10-CM

## 2024-09-07 DIAGNOSIS — M5459 Other low back pain: Secondary | ICD-10-CM

## 2024-09-07 DIAGNOSIS — M2560 Stiffness of unspecified joint, not elsewhere classified: Secondary | ICD-10-CM

## 2024-09-07 NOTE — Therapy (Signed)
 " OUTPATIENT PHYSICAL THERAPY THORACOLUMBAR TREATMENT  Patient Name: Jennifer Morrow MRN: [id number] DOB:18-Feb-1951, 74 y.o., female Today's Date: 09/07/2024  END OF SESSION:  PT End of Session - 09/07/24 0816     Visit Number 6    Number of Visits 24    Date for Recertification  11/14/24    PT Start Time 0812    PT Stop Time 0900    PT Time Calculation (min) 48 min    Activity Tolerance Patient tolerated treatment well;Patient limited by fatigue;Patient limited by pain    Behavior During Therapy [facility] for tasks assessed/performed         Past Medical History:  Diagnosis Date   Allergy 2014   seasonal   Arthritis    Bronchitis    CHRONIC   Bulging lumbar disc    degenerative dics   Cataract    corrected with surgery   CHF (congestive heart failure) (HCC) 04/13/2024   Scoliosis    LIMITS LUNG FUNCTION   Spinal stenosis    Past Surgical History:  Procedure Laterality Date   CATARACT EXTRACTION W/PHACO Left 01/06/2018   Procedure: CATARACT EXTRACTION PHACO AND INTRAOCULAR LENS PLACEMENT (IOC);  Surgeon: Myrna Adine Anes, MD;  Location: [facility] ORS;  Service: Ophthalmology;  Laterality: Left;  US  00:27.5 AP% 4.3 CDE 1.18 FLUID PACK LOT # [id number] H   CATARACT EXTRACTION W/PHACO Right 02/10/2018   Procedure: CATARACT EXTRACTION PHACO AND INTRAOCULAR LENS PLACEMENT (IOC);  Surgeon: Myrna Adine Anes, MD;  Location: [facility] ORS;  Service: Ophthalmology;  Laterality: Right;  US  00:30.5 AP% 5.0 CDE 1.51  Fluid pack lot # [id number] H   EYE SURGERY  2019   cataract   FOOT X 2     HEEL SPUR EXCISION     LUMBAR SPINE SURGERY  05/01/2024   thoracolumbar fusioin   TONSILLECTOMY     Patient Active Problem List   Diagnosis Date Noted   Elevated liver enzymes 06/14/2023   Age-related osteoporosis without current pathological fracture 05/19/2021   Spondylosis of lumbar region without myelopathy or radiculopathy 05/19/2021   Scoliosis, unspecified 01/09/2015   Vitamin D  deficiency  01/08/2014   Mixed hyperlipidemia 01/08/2014   PCP: Lemon Raisin, MD  REFERRING PROVIDER: Emi Bills, MD  REFERRING DIAG:  Diagnosis  [phone] (ICD-10-CM) - Other specified postprocedural states  M41.9 (ICD-10-CM) - Scoliosis, unspecified  M41.50 (ICD-10-CM) - Other secondary scoliosis, site unspecified  G89.29 (ICD-10-CM) - Other chronic pain  M54.42 (ICD-10-CM) - Lumbago with sciatica, left side  M54.50 (ICD-10-CM) - Low back pain, unspecified   Rationale for Evaluation and Treatment: Rehabilitation  THERAPY DIAG:  Muscle weakness (generalized)  Joint stiffness of spine  Other low back pain  Physical deconditioning  ONSET DATE:  05/11/24 surgery date.  See previous PT evaluation/ notes.            SUBJECTIVE:  SUBJECTIVE STATEMENT: Pt. S/p entire spinal fusion (T4 to S1) on 05/11/24.  Pt. Was receiving PT prior to surgery and has completed extensive inpatient/ HHPT.  Pt. Reports success with surgery and was told she has 0 deg. Thoracic curve and 20 deg. Lumbar curve.  Pt. Reports 2/10 back pain at rest (2 extra strength Tylenol), 9/10 at worst (feels hardware)- cold weather (stiff/ stabbing pain).  Pt. Unable to use hot tub at this time secondary to limited by weight of hot tub cover. Pt. Reports having a 5# lifting restriction at this time.      PERTINENT HISTORY:  Pt. Well known to PT clinic.    PAIN:  Are you having pain? Yes: NPRS scale: 2/10 Pain location: back Pain description: stiffness/ stabbing.  Aggravating factors: increase activity/ prolonged standing and walking/ cold weather Relieving factors: heat (hot tub use)  PRECAUTIONS: Back/ scoliosis/ lifting (>5#)  RED FLAGS: None   WEIGHT BEARING RESTRICTIONS: No  FALLS:  Has patient fallen in last 6 months? No  LIVING  ENVIRONMENT: Lives with: lives alone Lives in: House/apartment Has following equipment at home: Single point cane  OCCUPATION: Retired  PLOF: Independent  PATIENT GOALS: Get over commode without grab bars/ increase walking endurance and generalized strength/ conditioning.    NEXT MD VISIT: 10/2024  OBJECTIVE:  Note: Objective measures were completed at Evaluation unless otherwise noted.  DIAGNOSTIC FINDINGS:  See imaging  PATIENT SURVEYS:  MODI: 64% self-perceived extreme disability.   LEFS: 15 out of 80  COGNITION: Overall cognitive status: Within functional limits for tasks assessed     SENSATION: L LE to foot N/T  MUSCLE LENGTH: Hamstrings: Right 52 deg; Left 50 deg Hip flexion (supine): Right 100 deg.; Left 96 deg.   POSTURE: rounded shoulders and forward head.  Moderate scoliosis (L to R).    PALPATION: Tenderness over B thoracic/ lumbar paraspinals.  Excellent incision healing/ correction of scoliosis.     LUMBAR ROM:   Spinal fusion.  No spinal mobility.  Good hip ROM.    LOWER EXTREMITY ROM:     Active  Right eval Left eval  Hip flexion 100 deg. 96 deg.  Hip extension    Hip abduction [facility]  Hip adduction    Hip internal rotation 22 deg. 24 deg.  Hip external rotation    Knee flexion 136 deg. 132 deg.   Knee extension 0 deg. 0 deg.   Ankle dorsiflexion    Ankle plantarflexion    Ankle inversion    Ankle eversion     (Blank rows = not tested)  B shoulder flexion limited to 90 deg. Secondary to mid-back pain.     STS from mat table (22.5) without UE assist.  Pt. Requires UE assist with regular chair/ commode height.     Bed mobility requires extra time/ challenging.  Good understanding/ technique.   LOWER EXTREMITY MMT:    MMT Right eval Left eval  Hip flexion 3+ 3+  Hip extension    Hip abduction 3+ 3+  Hip adduction    Hip internal rotation 3+ 3+  Hip external rotation 3+ 3+  Knee flexion 4+ 4+  Knee extension 4 4  Ankle  dorsiflexion 4 4  Ankle plantarflexion    Ankle inversion    Ankle eversion     (Blank rows = not tested)  LUMBAR SPECIAL TESTS:  NT  FUNCTIONAL TESTS:  5 times sit to stand: TBD  GAIT: Distance walked: in clinic Assistive device utilized: None Level  of assistance: Modified independence Comments: Pt. Ambulates with consistent recip. Gait pattern with no assistive devices.  Pt. Has rounded shoulder posture with limited arm swing.  Good heel strike/ clearance.    TREATMENT DATE:  09/07/2024              Subjective:   Pt reports feeling great today. Pt reports very minimal pain. Pt reports that she was able to play 4 hours of bridge with her friends yesterday. Pt also reports that she was able to get up from a handicap toilet with one hand assist on grab bar and is excited to be able to get out of the house more.  There.ex.:  Nustep L4 B UE/LE 10 min.  Consistent SPM with no rest breaks.   Applied MH to back.    Standing 2nd step lunges/ hamstring stretches.  Static holds as tolerated 5x each.   Standing hip ex.: 4# ankle wts for hip flexion/ abduction/ heel raises/ dosiflexion/ hamstring curling (to fatigue).    Standing scap. retractions, ER, Horizontal Abduction, lateral raises 2 sets with yellow theraband.   Walking in hallway with focus on consistent gait pattern/ arm swing/ upright posture with 4# AW for about 364ft.  STS from blue mat table decreasing the height till unable to stand without UE assist. (Till fatigue)- able to stand from lower mat table height  Manual tx.:  Supine LE generalized stretches (focus on proximal/ distal hamstring, piriformis, hip).  Pt. Challenged and requires extra time/ min. PT assist while going from seated to supine position on mat table.                                                                                          PATIENT EDUCATION:  Education details: Discussed STS/ HEP and daily activity Person educated: Patient Education  method: Medical Illustrator Education comprehension: verbalized understanding and returned demonstration  HOME EXERCISE PROGRAM: Access Code: [email or web address] URL: [email or web address] Date: 08/24/2024 Prepared by: Ozell Sero  Exercises - Standing March with Counter Support  - 1 x daily - 5 x weekly - 2 sets - 15 reps - Standing Hip Abduction with Counter Support  - 1 x daily - 5 x weekly - 2 sets - 15 reps - Standing Hip Extension with Counter Support  - 1 x daily - 5 x weekly - 2 sets - 15 reps - Mini Squat with Counter Support  - 1 x daily - 5 x weekly - 2 sets - 15 reps - Standing Shoulder Flexion ROM with Dowel  - 1 x daily - 5 x weekly - 2 sets - 15 reps - Standing Chest Press with Bar  - 1 x daily - 5 x weekly - 2 sets - 15 reps - Shoulder External Rotation and Scapular Retraction  - 1 x daily - 5 x weekly - 2 sets - 15 reps   ASSESSMENT:  CLINICAL IMPRESSION: Pt progressing with generalized conditioning over past week. Pt did not need as many rest breaks as compared to last time. Pt. STS is improving with being able to stand from lower chair height. Pt showed good heel strike and toe  off with 4# AW. Pt continues to have pain with bed mobility and benefits from PT assist to transfer form supine to sitting.  Pt. Will benefit from skilled PT services to develop progressive HEP to increase generalized strength, esp. Core/lumbar musculature to improve pain-free functional mobility.          OBJECTIVE IMPAIRMENTS: Abnormal gait, decreased activity tolerance, decreased endurance, decreased mobility, difficulty walking, decreased ROM, decreased strength, impaired flexibility, improper body mechanics, postural dysfunction, and pain.   ACTIVITY LIMITATIONS: carrying, lifting, bending, standing, squatting, transfers, and locomotion level  PARTICIPATION LIMITATIONS: cleaning, laundry, driving, shopping, and community activity  PERSONAL FACTORS: Fitness and Past/current  experiences are also affecting patient's functional outcome.   REHAB POTENTIAL: Good  CLINICAL DECISION MAKING: Evolving/moderate complexity  EVALUATION COMPLEXITY: Moderate   GOALS: Goals reviewed with patient? Yes  SHORT TERM GOALS: Target date: 10/03/24  Pt. Independent with HEP to progress LE muscle strength 1/2 muscle grade to improve pain-free mobility.   Baseline: see above Goal status: INITIAL  LONG TERM GOALS: Target date: 11/14/24  Pt. Will decrease MODI to <40 to improve pain-free mobility.   Baseline: initial 64% self-perceived extreme disability.   Goal status: INITIAL  2.  Pt. Able to stand from regular height commode with no UE assist safely to allow pt. To travel to family/ improve independence.   Baseline: Pt. Able to stand from 22.5 without UE assist and requires extra time.  Goal status: INITIAL  3.  Pt. Will report <5/10 back pain at worst with daily household tasks.   Baseline: 9/10 pain at worst Goal status: INITIAL  4.  Pt. Able to complete 1 hour of daily household tasks with good endurance/ no increase c/o back pain.    Baseline: limited endurance/ 9/10 pain Goal status: INITIAL  PLAN:  PT FREQUENCY: 2x/week  PT DURATION: 12 weeks  PLANNED INTERVENTIONS: 97110-Therapeutic exercises, 97530- Therapeutic activity, 97112- Neuromuscular re-education, 97535- Self Care, 02859- Manual therapy, [phone]- Gait training, [phone]- Aquatic Therapy, Patient/Family education, Balance training, Stair training, Dry Needling, Joint mobilization, Cryotherapy, and Moist heat.  PLAN FOR NEXT SESSION:  Increase standing/ walking endurance  Ozell JAYSON Sero, PT, DPT # [phone] Rankin Gainer, SPT. 09/07/2024, 9:58 AM  "

## 2024-09-12 ENCOUNTER — Ambulatory Visit: Admitting: Physical Therapy

## 2024-09-14 ENCOUNTER — Ambulatory Visit: Admitting: Physical Therapy

## 2024-09-19 ENCOUNTER — Ambulatory Visit: Admitting: Physical Therapy

## 2024-09-21 ENCOUNTER — Ambulatory Visit: Admitting: Physical Therapy

## 2024-09-22 ENCOUNTER — Ambulatory Visit: Admitting: Physical Therapy

## 2024-09-22 NOTE — Therapy (Signed)
 " OUTPATIENT PHYSICAL THERAPY THORACOLUMBAR TREATMENT  Patient Name: Jennifer Morrow MRN: [id number] DOB:29-May-1951, 74 y.o., female Today's Date: 09/22/2024  END OF SESSION:  PT End of Session - 09/22/24 1616     Visit Number 7    Number of Visits 24    Date for Recertification  11/14/24    PT Start Time 1557    PT Stop Time 1648    PT Time Calculation (min) 51 min    Activity Tolerance Patient tolerated treatment well;Patient limited by fatigue;Patient limited by pain    Behavior During Therapy [facility] for tasks assessed/performed         Past Medical History:  Diagnosis Date   Allergy 2014   seasonal   Arthritis    Bronchitis    CHRONIC   Bulging lumbar disc    degenerative dics   Cataract    corrected with surgery   CHF (congestive heart failure) (HCC) 04/13/2024   Scoliosis    LIMITS LUNG FUNCTION   Spinal stenosis    Past Surgical History:  Procedure Laterality Date   CATARACT EXTRACTION W/PHACO Left 01/06/2018   Procedure: CATARACT EXTRACTION PHACO AND INTRAOCULAR LENS PLACEMENT (IOC);  Surgeon: Myrna Adine Anes, MD;  Location: [facility] ORS;  Service: Ophthalmology;  Laterality: Left;  US  00:27.5 AP% 4.3 CDE 1.18 FLUID PACK LOT # [id number]   CATARACT EXTRACTION W/PHACO Right 02/10/2018   Procedure: CATARACT EXTRACTION PHACO AND INTRAOCULAR LENS PLACEMENT (IOC);  Surgeon: Myrna Adine Anes, MD;  Location: [facility] ORS;  Service: Ophthalmology;  Laterality: Right;  US  00:30.5 AP% 5.0 CDE 1.51  Fluid pack lot # [id number]   EYE SURGERY  2019   cataract   FOOT X 2     HEEL SPUR EXCISION     LUMBAR SPINE SURGERY  05/01/2024   thoracolumbar fusioin   TONSILLECTOMY     Patient Active Problem List   Diagnosis Date Noted   Elevated liver enzymes 06/14/2023   Age-related osteoporosis without current pathological fracture 05/19/2021   Spondylosis of lumbar region without myelopathy or radiculopathy 05/19/2021   Scoliosis, unspecified 01/09/2015   Vitamin D  deficiency  01/08/2014   Mixed hyperlipidemia 01/08/2014   PCP: Lemon Raisin, MD  REFERRING PROVIDER: Emi Bills, MD  REFERRING DIAG:  Diagnosis  [phone] (ICD-10-CM) - Other specified postprocedural states  M41.9 (ICD-10-CM) - Scoliosis, unspecified  M41.50 (ICD-10-CM) - Other secondary scoliosis, site unspecified  G89.29 (ICD-10-CM) - Other chronic pain  M54.42 (ICD-10-CM) - Lumbago with sciatica, left side  M54.50 (ICD-10-CM) - Low back pain, unspecified   Rationale for Evaluation and Treatment: Rehabilitation  THERAPY DIAG:  Muscle weakness (generalized)  Joint stiffness of spine  Other low back pain  Physical deconditioning  ONSET DATE:  05/11/24 surgery date.  See previous PT evaluation/ notes.            SUBJECTIVE:  SUBJECTIVE STATEMENT: Pt. S/p entire spinal fusion (T4 to S1) on 05/11/24.  Pt. Was receiving PT prior to surgery and has completed extensive inpatient/ HHPT.  Pt. Reports success with surgery and was told she has 0 deg. Thoracic curve and 20 deg. Lumbar curve.  Pt. Reports 2/10 back pain at rest (2 extra strength Tylenol), 9/10 at worst (feels hardware)- cold weather (stiff/ stabbing pain).  Pt. Unable to use hot tub at this time secondary to limited by weight of hot tub cover. Pt. Reports having a 5# lifting restriction at this time.      PERTINENT HISTORY:  Pt. Well known to PT clinic.    PAIN:  Are you having pain? Yes: NPRS scale: 2/10 Pain location: back Pain description: stiffness/ stabbing.  Aggravating factors: increase activity/ prolonged standing and walking/ cold weather Relieving factors: heat (hot tub use)  PRECAUTIONS: Back/ scoliosis/ lifting (>5#)  RED FLAGS: None   WEIGHT BEARING RESTRICTIONS: No  FALLS:  Has patient fallen in last 6 months? No  LIVING  ENVIRONMENT: Lives with: lives alone Lives in: House/apartment Has following equipment at home: Single point cane  OCCUPATION: Retired  PLOF: Independent  PATIENT GOALS: Get over commode without grab bars/ increase walking endurance and generalized strength/ conditioning.    NEXT MD VISIT: 10/2024  OBJECTIVE:  Note: Objective measures were completed at Evaluation unless otherwise noted.  DIAGNOSTIC FINDINGS:  See imaging  PATIENT SURVEYS:  MODI: 64% self-perceived extreme disability.   LEFS: 15 out of 80  COGNITION: Overall cognitive status: Within functional limits for tasks assessed     SENSATION: L LE to foot N/T  MUSCLE LENGTH: Hamstrings: Right 52 deg; Left 50 deg Hip flexion (supine): Right 100 deg.; Left 96 deg.   POSTURE: rounded shoulders and forward head.  Moderate scoliosis (L to R).    PALPATION: Tenderness over B thoracic/ lumbar paraspinals.  Excellent incision healing/ correction of scoliosis.     LUMBAR ROM:   Spinal fusion.  No spinal mobility.  Good hip ROM.    LOWER EXTREMITY ROM:     Active  Right eval Left eval  Hip flexion 100 deg. 96 deg.  Hip extension    Hip abduction [facility]  Hip adduction    Hip internal rotation 22 deg. 24 deg.  Hip external rotation    Knee flexion 136 deg. 132 deg.   Knee extension 0 deg. 0 deg.   Ankle dorsiflexion    Ankle plantarflexion    Ankle inversion    Ankle eversion     (Blank rows = not tested)  B shoulder flexion limited to 90 deg. Secondary to mid-back pain.     STS from mat table (22.5) without UE assist.  Pt. Requires UE assist with regular chair/ commode height.     Bed mobility requires extra time/ challenging.  Good understanding/ technique.   LOWER EXTREMITY MMT:    MMT Right eval Left eval  Hip flexion 3+ 3+  Hip extension    Hip abduction 3+ 3+  Hip adduction    Hip internal rotation 3+ 3+  Hip external rotation 3+ 3+  Knee flexion 4+ 4+  Knee extension 4 4  Ankle  dorsiflexion 4 4  Ankle plantarflexion    Ankle inversion    Ankle eversion     (Blank rows = not tested)  LUMBAR SPECIAL TESTS:  NT  FUNCTIONAL TESTS:  5 times sit to stand: TBD  GAIT: Distance walked: in clinic Assistive device utilized: None Level  of assistance: Modified independence Comments: Pt. Ambulates with consistent recip. Gait pattern with no assistive devices.  Pt. Has rounded shoulder posture with limited arm swing.  Good heel strike/ clearance.    TREATMENT DATE:  09/22/2024              Subjective:   Pt reports feeling good today. Pt reports 3/10 back pain. Pt has been out of the house doing errands for around 5 hours today with no increase back pain. Pt is excited to be out of the house and able to be with people. Pt was able to play bridges with friends today. Pt also reported that she was able to get up from a low toilet with one hand rail earlier today. Pt is planning a trip to [place] in [place] weeks.  There.ex.:  Nustep L4 B UE/LE 12 min.  Consistent SPM with no rest breaks.   Applied MH to back.    Standing 2nd step lunges/ hamstring stretches.  Static holds as tolerated 5x each.   LAQ, Dorsiflexion/Plantar flexion, Marching with 3# AW 20x (fatigue)  Standing hip ex.: 3# ankle wts for hip flexion/ abduction/ heel raises/ dosiflexion/ hamstring curling (to fatigue).    Walking in hallway with marches with focus on consistent gait pattern/ arm swing/ upright posture or about 319ft.  Walking in hallway with 3# AW for 200 ft. (Focus on good heel strike and toe off and picking feet off ground)  STS from blue mat table decreasing the height till unable to stand without UE assist. (Till fatigue)- able to stand from lower mat table height  Manual tx.:  Supine LE generalized stretches (focus on proximal/ distal hamstring, piriformis, hip).  Pt. Challenged and requires extra time/ min. PT assist while going from seated to supine position on mat table.                                                                                           PATIENT EDUCATION:  Education details: Discussed STS/ HEP and daily activity Person educated: Patient Education method: Medical Illustrator Education comprehension: verbalized understanding and returned demonstration  HOME EXERCISE PROGRAM: Access Code: [email or web address] URL: [email or web address] Date: 08/24/2024 Prepared by: Ozell Sero  Exercises - Standing March with Counter Support  - 1 x daily - 5 x weekly - 2 sets - 15 reps - Standing Hip Abduction with Counter Support  - 1 x daily - 5 x weekly - 2 sets - 15 reps - Standing Hip Extension with Counter Support  - 1 x daily - 5 x weekly - 2 sets - 15 reps - Mini Squat with Counter Support  - 1 x daily - 5 x weekly - 2 sets - 15 reps - Standing Shoulder Flexion ROM with Dowel  - 1 x daily - 5 x weekly - 2 sets - 15 reps - Standing Chest Press with Bar  - 1 x daily - 5 x weekly - 2 sets - 15 reps - Shoulder External Rotation and Scapular Retraction  - 1 x daily - 5 x weekly - 2 sets - 15 reps   ASSESSMENT:  CLINICAL IMPRESSION:  Pt is progressing well. Pt endurance and conditioning in walking has improved significantly. Pt was able to run errands and be out of the house for 5 hours today. Pt has been able to complete STS from lower level. Pt reported that manual therapy decreased pain. Pt continues to have pain with bed mobility and benefits from PT assist to transfer form supine to sitting.  Pt. Will benefit from skilled PT services to develop progressive HEP to increase generalized strength, esp. Core/lumbar musculature to improve pain-free functional mobility.                OBJECTIVE IMPAIRMENTS: Abnormal gait, decreased activity tolerance, decreased endurance, decreased mobility, difficulty walking, decreased ROM, decreased strength, impaired flexibility, improper body mechanics, postural dysfunction, and pain.   ACTIVITY LIMITATIONS:  carrying, lifting, bending, standing, squatting, transfers, and locomotion level  PARTICIPATION LIMITATIONS: cleaning, laundry, driving, shopping, and community activity  PERSONAL FACTORS: Fitness and Past/current experiences are also affecting patient's functional outcome.   REHAB POTENTIAL: Good  CLINICAL DECISION MAKING: Evolving/moderate complexity  EVALUATION COMPLEXITY: Moderate   GOALS: Goals reviewed with patient? Yes  SHORT TERM GOALS: Target date: 10/03/24  Pt. Independent with HEP to progress LE muscle strength 1/2 muscle grade to improve pain-free mobility.   Baseline: see above Goal status: Partially met  LONG TERM GOALS: Target date: 11/14/24  Pt. Will decrease MODI to <40 to improve pain-free mobility.   Baseline: initial 64% self-perceived extreme disability.   Goal status: INITIAL  2.  Pt. Able to stand from regular height commode with no UE assist safely to allow pt. To travel to family/ improve independence.   Baseline: Pt. Able to stand from 22.5 without UE assist and requires extra time.  Goal status: INITIAL  3.  Pt. Will report <5/10 back pain at worst with daily household tasks.   Baseline: 9/10 pain at worst Goal status: INITIAL  4.  Pt. Able to complete 1 hour of daily household tasks with good endurance/ no increase c/o back pain.    Baseline: limited endurance/ 9/10 pain Goal status: INITIAL  PLAN:  PT FREQUENCY: 2x/week  PT DURATION: 12 weeks  PLANNED INTERVENTIONS: 97110-Therapeutic exercises, 97530- Therapeutic activity, 97112- Neuromuscular re-education, 97535- Self Care, 02859- Manual therapy, [phone]- Gait training, [phone]- Aquatic Therapy, Patient/Family education, Balance training, Stair training, Dry Needling, Joint mobilization, Cryotherapy, and Moist heat.  PLAN FOR NEXT SESSION:  Increase standing/ walking endurance, TRX Squats.  Ozell JAYSON Sero, PT, DPT # [phone] Rankin Gainer, SPT. 09/22/2024, 5:32 PM  "

## 2024-09-26 ENCOUNTER — Ambulatory Visit: Admitting: Physical Therapy

## 2024-09-28 ENCOUNTER — Ambulatory Visit: Admitting: Physical Therapy

## 2024-10-03 ENCOUNTER — Ambulatory Visit: Admitting: Physical Therapy

## 2024-10-05 ENCOUNTER — Ambulatory Visit: Admitting: Physical Therapy

## 2024-10-10 ENCOUNTER — Ambulatory Visit: Admitting: Physical Therapy

## 2024-10-12 ENCOUNTER — Ambulatory Visit: Admitting: Physical Therapy

## 2024-10-17 ENCOUNTER — Ambulatory Visit: Admitting: Physical Therapy

## 2024-10-17 ENCOUNTER — Encounter: Admitting: Student

## 2024-10-19 ENCOUNTER — Ambulatory Visit: Admitting: Physical Therapy

## 2024-10-24 ENCOUNTER — Ambulatory Visit: Admitting: Physical Therapy

## 2024-10-26 ENCOUNTER — Ambulatory Visit: Admitting: Physical Therapy

## 2024-10-31 ENCOUNTER — Ambulatory Visit: Admitting: Physical Therapy

## 2024-11-02 ENCOUNTER — Ambulatory Visit

## 2024-11-02 ENCOUNTER — Ambulatory Visit: Admitting: Physical Therapy

## 2024-11-06 ENCOUNTER — Ambulatory Visit: Admitting: Physical Therapy

## 2024-11-09 ENCOUNTER — Ambulatory Visit: Admitting: Physical Therapy

## 2024-11-14 ENCOUNTER — Ambulatory Visit: Admitting: Physical Therapy

## 2024-11-16 ENCOUNTER — Ambulatory Visit: Admitting: Physical Therapy

## 2025-01-31 ENCOUNTER — Ambulatory Visit

## 2025-02-01 ENCOUNTER — Ambulatory Visit
